# Patient Record
Sex: Male | Born: 1955
Health system: Southern US, Community
[De-identification: ages and names within clinical notes are randomized; demographics above are authoritative.]

## PROBLEM LIST (undated history)

## (undated) ENCOUNTER — Emergency Department (HOSPITAL_COMMUNITY): Admission: EM | Payer: Medicare Other | Source: Home / Self Care

## (undated) DIAGNOSIS — Z8613 Personal history of malaria: Secondary | ICD-10-CM

## (undated) DIAGNOSIS — I341 Nonrheumatic mitral (valve) prolapse: Secondary | ICD-10-CM

## (undated) DIAGNOSIS — Z8709 Personal history of other diseases of the respiratory system: Secondary | ICD-10-CM

## (undated) DIAGNOSIS — I10 Essential (primary) hypertension: Secondary | ICD-10-CM

## (undated) DIAGNOSIS — F329 Major depressive disorder, single episode, unspecified: Secondary | ICD-10-CM

## (undated) DIAGNOSIS — Z860100 Personal history of colon polyps, unspecified: Secondary | ICD-10-CM

## (undated) DIAGNOSIS — M47812 Spondylosis without myelopathy or radiculopathy, cervical region: Secondary | ICD-10-CM

## (undated) DIAGNOSIS — F32A Depression, unspecified: Secondary | ICD-10-CM

## (undated) DIAGNOSIS — K219 Gastro-esophageal reflux disease without esophagitis: Secondary | ICD-10-CM

## (undated) DIAGNOSIS — T7840XA Allergy, unspecified, initial encounter: Secondary | ICD-10-CM

## (undated) DIAGNOSIS — A060 Acute amebic dysentery: Secondary | ICD-10-CM

## (undated) DIAGNOSIS — K6289 Other specified diseases of anus and rectum: Secondary | ICD-10-CM

## (undated) DIAGNOSIS — F419 Anxiety disorder, unspecified: Secondary | ICD-10-CM

## (undated) DIAGNOSIS — R079 Chest pain, unspecified: Secondary | ICD-10-CM

## (undated) DIAGNOSIS — G4700 Insomnia, unspecified: Secondary | ICD-10-CM

## (undated) HISTORY — DX: Essential (primary) hypertension: I10

## (undated) HISTORY — DX: Major depressive disorder, single episode, unspecified: F32.9

## (undated) HISTORY — DX: Allergy, unspecified, initial encounter: T78.40XA

## (undated) HISTORY — DX: Depression, unspecified: F32.A

## (undated) HISTORY — DX: Insomnia, unspecified: G47.00

## (undated) HISTORY — DX: Spondylosis without myelopathy or radiculopathy, cervical region: M47.812

## (undated) HISTORY — DX: Gastro-esophageal reflux disease without esophagitis: K21.9

## (undated) HISTORY — PX: CARPAL TUNNEL RELEASE: SHX101

## (undated) HISTORY — DX: Nonrheumatic mitral (valve) prolapse: I34.1

## (undated) HISTORY — DX: Anxiety disorder, unspecified: F41.9

## (undated) HISTORY — DX: Personal history of colonic polyps: Z86.010

## (undated) HISTORY — DX: Personal history of colon polyps, unspecified: Z86.0100

## (undated) HISTORY — DX: Other specified diseases of anus and rectum: K62.89

---
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
## 1979-08-31 DIAGNOSIS — A060 Acute amebic dysentery: Secondary | ICD-10-CM

## 1979-08-31 HISTORY — DX: Acute amebic dysentery: A06.0

## 1982-08-30 DIAGNOSIS — Z8613 Personal history of malaria: Secondary | ICD-10-CM

## 1982-08-30 HISTORY — DX: Personal history of malaria: Z86.13

## 1998-10-13 ENCOUNTER — Ambulatory Visit (HOSPITAL_COMMUNITY): Admission: RE | Admit: 1998-10-13 | Discharge: 1998-10-13 | Payer: Self-pay | Admitting: Emergency Medicine

## 1998-10-13 ENCOUNTER — Encounter: Payer: Self-pay | Admitting: Emergency Medicine

## 1999-04-19 ENCOUNTER — Encounter: Payer: Self-pay | Admitting: Emergency Medicine

## 1999-04-19 ENCOUNTER — Emergency Department (HOSPITAL_COMMUNITY): Admission: EM | Admit: 1999-04-19 | Discharge: 1999-04-19 | Payer: Self-pay | Admitting: Emergency Medicine

## 2000-01-07 ENCOUNTER — Encounter: Payer: Self-pay | Admitting: Otolaryngology

## 2000-01-07 ENCOUNTER — Encounter: Admission: RE | Admit: 2000-01-07 | Discharge: 2000-01-07 | Payer: Self-pay | Admitting: Otolaryngology

## 2002-01-16 ENCOUNTER — Encounter: Admission: RE | Admit: 2002-01-16 | Discharge: 2002-01-16 | Payer: Self-pay | Admitting: Internal Medicine

## 2002-01-16 ENCOUNTER — Encounter: Payer: Self-pay | Admitting: Internal Medicine

## 2004-03-13 ENCOUNTER — Encounter: Admission: RE | Admit: 2004-03-13 | Discharge: 2004-03-13 | Payer: Self-pay | Admitting: Surgery

## 2005-06-14 ENCOUNTER — Ambulatory Visit (HOSPITAL_COMMUNITY): Admission: RE | Admit: 2005-06-14 | Discharge: 2005-06-14 | Payer: Self-pay | Admitting: *Deleted

## 2005-06-14 ENCOUNTER — Ambulatory Visit (HOSPITAL_BASED_OUTPATIENT_CLINIC_OR_DEPARTMENT_OTHER): Admission: RE | Admit: 2005-06-14 | Discharge: 2005-06-14 | Payer: Self-pay | Admitting: *Deleted

## 2005-09-07 ENCOUNTER — Encounter: Admission: RE | Admit: 2005-09-07 | Discharge: 2005-09-07 | Payer: Self-pay | Admitting: Neurosurgery

## 2005-09-28 ENCOUNTER — Encounter: Admission: RE | Admit: 2005-09-28 | Discharge: 2005-09-28 | Payer: Self-pay | Admitting: Neurosurgery

## 2005-09-30 HISTORY — PX: OTHER SURGICAL HISTORY: SHX169

## 2005-10-01 ENCOUNTER — Encounter: Admission: RE | Admit: 2005-10-01 | Discharge: 2005-10-01 | Payer: Self-pay | Admitting: Neurosurgery

## 2005-10-19 ENCOUNTER — Inpatient Hospital Stay (HOSPITAL_COMMUNITY): Admission: RE | Admit: 2005-10-19 | Discharge: 2005-10-21 | Payer: Self-pay | Admitting: Neurosurgery

## 2006-05-22 ENCOUNTER — Emergency Department (HOSPITAL_COMMUNITY): Admission: EM | Admit: 2006-05-22 | Discharge: 2006-05-22 | Payer: Self-pay | Admitting: Family Medicine

## 2008-01-21 ENCOUNTER — Emergency Department (HOSPITAL_COMMUNITY): Admission: EM | Admit: 2008-01-21 | Discharge: 2008-01-21 | Payer: Self-pay | Admitting: Family Medicine

## 2008-01-28 ENCOUNTER — Emergency Department (HOSPITAL_COMMUNITY): Admission: EM | Admit: 2008-01-28 | Discharge: 2008-01-28 | Payer: Self-pay | Admitting: Emergency Medicine

## 2008-02-01 ENCOUNTER — Encounter (INDEPENDENT_AMBULATORY_CARE_PROVIDER_SITE_OTHER): Payer: Self-pay | Admitting: Emergency Medicine

## 2008-02-01 ENCOUNTER — Inpatient Hospital Stay (HOSPITAL_COMMUNITY): Admission: EM | Admit: 2008-02-01 | Discharge: 2008-02-02 | Payer: Self-pay | Admitting: Emergency Medicine

## 2008-02-01 ENCOUNTER — Ambulatory Visit: Payer: Self-pay | Admitting: Vascular Surgery

## 2008-02-02 ENCOUNTER — Ambulatory Visit: Payer: Self-pay | Admitting: Psychiatry

## 2008-02-02 ENCOUNTER — Encounter (INDEPENDENT_AMBULATORY_CARE_PROVIDER_SITE_OTHER): Payer: Self-pay | Admitting: Internal Medicine

## 2008-02-08 ENCOUNTER — Ambulatory Visit (HOSPITAL_COMMUNITY): Admission: RE | Admit: 2008-02-08 | Discharge: 2008-02-08 | Payer: Self-pay | Admitting: Neurology

## 2008-02-27 ENCOUNTER — Emergency Department (HOSPITAL_COMMUNITY): Admission: EM | Admit: 2008-02-27 | Discharge: 2008-02-28 | Payer: Self-pay | Admitting: Emergency Medicine

## 2008-04-04 ENCOUNTER — Ambulatory Visit: Payer: Self-pay | Admitting: Psychology

## 2008-10-26 ENCOUNTER — Emergency Department (HOSPITAL_COMMUNITY): Admission: EM | Admit: 2008-10-26 | Discharge: 2008-10-26 | Payer: Self-pay | Admitting: Family Medicine

## 2008-10-28 HISTORY — PX: CARPAL TUNNEL RELEASE: SHX101

## 2009-08-04 ENCOUNTER — Encounter: Admission: RE | Admit: 2009-08-04 | Discharge: 2009-08-04 | Payer: Self-pay | Admitting: Neurosurgery

## 2009-10-27 ENCOUNTER — Encounter: Admission: RE | Admit: 2009-10-27 | Discharge: 2009-10-27 | Payer: Self-pay | Admitting: Internal Medicine

## 2010-02-06 ENCOUNTER — Encounter: Admission: RE | Admit: 2010-02-06 | Discharge: 2010-02-06 | Payer: Self-pay | Admitting: Neurosurgery

## 2010-02-26 ENCOUNTER — Encounter: Admission: RE | Admit: 2010-02-26 | Discharge: 2010-02-26 | Payer: Self-pay | Admitting: Neurosurgery

## 2010-03-09 ENCOUNTER — Encounter: Admission: RE | Admit: 2010-03-09 | Discharge: 2010-03-09 | Payer: Self-pay | Admitting: Neurosurgery

## 2010-09-20 ENCOUNTER — Encounter: Payer: Self-pay | Admitting: Neurosurgery

## 2011-01-12 NOTE — H&P (Signed)
[name], [name] NO.:  [id number]   MEDICAL RECORD NO.:  [id number]          PATIENT TYPE:  INP   LOCATION:  3737                         FACILITY:  [facility]   PHYSICIAN:  Ramiro Harvest, MD    DATE OF BIRTH:  10-16-1955   DATE OF ADMISSION:  02/01/2008  DATE OF DISCHARGE:                              HISTORY & PHYSICAL   PRIMARY CARE PHYSICIAN:  Duncan Dull, MD   CARDIOLOGIST:  Dr. Harland Dingwall at [facility]   HISTORY OF PRESENT ILLNESS:  Brad [name] is a 55 year old white male with  history of cervical spondylitis status post repair, anxiety, mitral  valve prolapse, and labile hypertension who presents to the ED for the  third time in the last week with a 3-4 week history of diffuse  continuous headaches, chills, dizziness, shortness of breath, and  midsternal intermittent chest pain.  The patient states that headache  has been continuous diffuse, unable to describe it with associated  blurry vision, slurred speech, unsteady gait, and numbness in his toes  and hands.  The patient denies any facial asymmetry.  No symmetric  weakness and also indulges in mild problems understanding speech  sometimes.  The patient also complained of midsternal intermittent chest  pain describes it as a tightness/pressure lasting greater than 30  minutes with radiation to abdominal region with palpitations, clammy  feeling, chills, shortness of breath.  The patient denies any fever.  No  cough.  No abdominal pain.  No melena.  No hematemesis.  No  hematochezia.  No other associated symptoms.  The patient also states  that she has been feeling depressed lately with decreased appetite,  insomnia, decreased concentration, feelings of guilt.  The patient  denies any suicidal ideation or any homicidal ideation.  The patient  states that he has been under a lot of stress lately.  The patient also  was complaining of some left lower extremity pain.  CMET, CBC, lower  extremity  Dopplers done in the emergency room were negative.  We were  called to admit the patient for further evaluation and management.   ALLERGIES:  No known drug allergies.   PAST MEDICAL HISTORY:  1. Cervical spondylitis of C5-C6, C6-C7, C7-T1, status post anterior      C5-C6, C6-C7, C7-T1 diskectomy, decompression spinal cord and      interbody fusion with allograft and autograft placed on T5-T1 on      October 19, 2005 Dr. Jeral Fruit.  2. Status post carpal tunnel surgery on the right on June 14, 2005,      by Dr. Tennis Must. Meyerdierks.  3. Peripheral neuropathy.  4. Mitral valve prolapse.  5. History of malaria.  6. Anxiety.  7. History of tinnitus secondary to barotrauma secondary to scuba      diving.  8. Labile hypertension.  9. Insomnia.   HOME MEDICATIONS:  1. Diovan 80 mg daily.  2. Ambien CR 12.5 mg p.o. q.h.s.  3. Sonata 10 mg p.o. q.h.s.  4. Dyazide 37.5 two times a week.  5. Tranxene 7 mg p.o. daily.  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  No tobacco, no IV use, is a social drinker,  drinks 1-2 glasses of wine per night.  He has a 54 year old son and an  73 year old daughter, all of whom are healthy.   FAMILY HISTORY:  Mother alive age 67.  Father had a history of  gallbladder cancer.  Father deceased age 95 from a cerebral hemorrhage.   REVIEW OF SYSTEMS:  As per HPI.   PHYSICAL EXAMINATION:  VITAL SIGNS:  Temperature 97.6, blood pressure  162/132 going down to 131/91 and going back up to 150/105, pulse of 107  going down to 97, respiratory rate 20, and satting 99% on room air.  GENERAL:  The patient is in no apparent distress, anxious-appearing.  HEENT:  Normocephalic and atraumatic.  Pupils equal, round, and reactive  to light.  Extraocular movements intact.  Oropharynx is clear.  No  lesions.  No exudates.  NECK:  Supple.  No lymphadenopathy.  RESPIRATORY:  Lungs are clear to auscultation bilaterally.   No wheezes.  No crackles.  No rhonchi.  CARDIOVASCULAR:  Regular rate and rhythm.  No murmurs, rubs, or gallops.  ABDOMEN:  Soft, nontender, and nondistended.  Positive bowel sounds.  EXTREMITIES:  No clubbing, cyanosis, or edema.  NEUROLOGIC:  The patient alert and oriented x3.  Cranial nerves II  through XII grossly intact.  Sensation is intact.  Negative Babinski.  Cerebella is intact.  No pronator drift.  Visual fields are intact.  5/5  bilateral upper extremity strength.  5/5 bilateral lower extremity  strength.  2+ patellar reflexes.  2+ Achilles reflexes.  Unable to  obtain brachial reflexes symmetrically.  Gait not tested.   LABORATORY DATA:  Sodium 133, potassium 3.9, chloride 97, bicarb 25, BUN  12, creatinine 0.94, glucose 101, calcium 9.9, albumin 4.8, bilirubin  1.4, and alk phosphatase 73.  AST 31, ALT 20, and protein 70.3.  D-dimer  less than 0.22.  CBC, white count 9.1, hemoglobin 15.8, hematocrit 45.9,  and platelets 233.  ANC of 6.4.  Acute abdominal series pending.  Lower  extremity Dopplers negative for DVT.   ASSESSMENT AND PLAN:  Brad [name] is a 55 year old gentleman with history  of mitral valve prolapse, labile hypertension, anxiety, cervical  spondylosis, status post repair who presents to the ED with a 3-4 week  history of headache, dizziness, and shortness of breath with some  neurological symptoms.  1. Headache/slurred speech/unsteady gait/blurred vision.  Questionable      etiology.  Differential includes CVA versus uncontrolled      hypertension versus headache versus anxiety.  We will admit the      patient to Telemetry.  Check head CT.  Check MRI and MRA of the      head and neck.  Cycle cardiac enzymes q.8 h x3.  Check an EKG.      Check a fasting lipid panel.  Check a fasting homocystine level.      Check a 2-D echo, bedside swallow evaluation, and Ultram for      headaches and aspirin 325 mg daily, PT/OT.  If MRI and MRA is      positive for an acute  infarct we will check carotid Dopplers and      consult with Neurology for further evaluation and recommendations.  2. Chest pain differential includes acute coronary syndrome versus GI      versus anxiety.  We will cycle cardiac enzymes  q.8 h. x3.  Check an      EKG.  Check a lipase.  Check a BNP.  Check a TSH.  Check a 2-D echo      to rule out LV dysfunction.  Check a fasting lipid panel,      nitroglycerin, morphine sulfate, oxygen, Lopressor 12.5 mg b.i.d.,      Diovan 80 mg daily, and Lovenox.  If positive cardiac enzymes to      consult with a Cardiology.  3. Labile hypertension.  Continue home dose Diovan and Lopressor and      hydralazine for systolic blood pressure greater than 180. Goal of      decrease in systolic blood pressure approximately 25% over the next      24 hours secondary to a possible CVA.  4. Anxiety.  Ativan p.r.n.  5. Mitral valve prolapse.  6. Peripheral neuropathy.  7. History of tinnitus.  8. History of cervical spondylitis, status post repair.  9. Status post carpal tunnel surgery.  10.Prophylaxis.  Protonix for GI prophylaxis.  Lovenox for DVT      prophylaxis.      Ramiro Harvest, MD  Electronically Signed     DT/MEDQ  D:  02/01/2008  T:  02/02/2008  Job:  [id number]   cc:   Candyce Churn, M.D.

## 2011-01-12 NOTE — Consult Note (Signed)
Brad [name], Brad [name] NO.:  [id number]   MEDICAL RECORD NO.:  [id number]          PATIENT TYPE:  INP   LOCATION:  3737                         FACILITY:  [facility]   PHYSICIAN:  Brad Jungling, MD  DATE OF BIRTH:  1955-10-25   DATE OF CONSULTATION:  02/02/2008  DATE OF DISCHARGE:                                 CONSULTATION   IDENTIFYING DATA/REASON FOR REFERRAL:  The patient is a 55 year old  married Caucasian male, currently under the care of Dr. Kevan [name] here at  [facility].  Psychiatric consultation is requested to assess  mood disturbance and make recommendations.   HISTORY OF THE PRESENTING PROBLEMS:  The patient has been admitted with  various symptoms including tremor, headache, chest pain which Dr. Kevan [name]  feels are likely not due to any underlying organic cause, but possibly  more consistent with a picture of excessive stress, anxiety and  depression.   The patient has been under the care of Dr. Archer [name], a local  psychiatrist, who the patient saw for an initial consultation about a  month ago.  He is also seeing an individual psychotherapy, Brad [name].  Although he has had trials of various antidepressant medications in the  past, he has not tolerated any of them.  In particular, Wellbutrin,  Cymbalta and Elavil seemed to cause significant agitation.  Other  antidepressants tried affected sexual functioning and were unacceptable.  He had been taking Klonopin 1 mg at bedtime for a long time, but that  was discontinued approximately 2 months ago.  In that time frame, the  patient has had more and more in the way of various anxious and  depressive symptoms.   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   The patient thus far has had a CT scan which has been  entirely normal.  Urine drug screen was positive only for benzodiazepines.  The patient is  having an MRI scan of the brain this morning.   Yesterday, the patient made a statement indicating the possibility of  suicidal ideation.  As such, he was placed with a one-to-one sitter for  safety.   In the course of this consultation, the patient gave me permission to  contact his wife Brad [name], who is a child psychiatrist.  She provided her  own prospective on the patient.  She sees him as very depressed and  describes various stressors that he has been dealing with including the  above referenced step-family issues and the harassing client.  She notes  that he has no history of suicide attempt and she does not feel that he  is an actual suicide risk, although she feels that it was probably a  good idea to order the one-to-one sitter.  She indicates that she is  comfortable with the possibility that he might be discharged home later  this afternoon.  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   She also notes that he was on 1 mg of Klonopin at bedtime for a long  time.  This was discontinued about 2 months ago.  Since then, he has  had more difficulties with various anxious and depressive symptoms.  She  states he is very responsible with taking medication.  She notes no  history of any substance abuse or chemical dependency issues.  Even when  he was on the Klonopin, he never took it every single night, as he did  not perceive that it was doing very much to help him.  She indicates  that currently, he is not on any psychotropic medications.  Although Dr.  Donell [name] had prescribed him some Tranxene, he apparently has not used it.   MENTAL STATUS/OBSERVATIONS:  The patient is a well-nourished, normally-  developed adult male who I interviewed in his hospital room.  He is  alert, awake and fully oriented.  He is of clearly superior   intelligence, intellect and education.  He was a pleasure to speak with.  I found him to be quite open and forthcoming with information about  himself and his situation.  He described in good detail the various  anxious and depressive symptoms he has had as well as the various  somatic problems that have been troubling him including headache.  He  also describes problems with concentration, focus and feeling like I  lost 100 IQ points.  He acknowledges feeling stressed by his career and  feels both pulled back to address his many responsibilities associated  with practice, yet at the same time dreading it.   He tells me that he has no true thoughts, plans or intention of harming  himself.  He discusses fairly openly the reason for the one-to-one  sitter and acknowledges the statement that he made that gave some alarm.  He gave me permission to contact his wife and if necessary Dr. Donell [name].   IMPRESSION:  Dr. Jolyne [name] describes a good deal in the way of various  anxious and depressive symptoms as well as various somatic complaints  that are currently being medically evaluated here at [facility], but  thus far, do not appear to be based on any organic condition.  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  He appears  to be open at this time to viewing his difficulties as being largely  stress related.  His wife appears to agree with this perspective.   DIAGNOSTIC IMPRESSION:  AXIS I:  Mood disorder not otherwise specified.  AXIS II:  Deferred.  AXIS III:  No acute or chronic illnesses.  AXIS IV:  Stressors severe.  AXIS V:  Global assessment of functioning 55.   RECOMMENDATIONS:  I discussed with the patient's wife, who is a  physician, the possibility that the discontinuation of Klonopin in  recent weeks may have  contributed to exacerbation of anxiety related  symptoms, depressive symptoms and perhaps even more so than it would  have otherwise had the patient not already been under a great deal of  work related and personal stress.  It is possible that there is a degree  of long-term benzodiazepine withdrawal occurring, even though he was not  abusing benzodiazepines, was not on a very high dose,  and has no history  of chemical dependency issues.  I have seen in many individuals who have  taken benzodiazepine such as Xanax and Klonopin over a long period of  time, that the withdrawal process can be extremely lengthy spanning  months.   Also of interest is the historical information that when he has gone to  emergency rooms recently with his various somatic complaints such as  headache, and has been given doses of benzodiazepines, he has  experienced rapid and almost complete relief from his distress.   The patient, as well as his wife, both agree with me that the most  important intervention at this time might be an examination by the  patient with the support of his wife, his therapist and his  psychiatrist, of the possibility of significant reduction of his  stressors load by reducing the volume of his practice, or taking a  significant period of time off for rest and recovery.   At this time, I do not feel that the patient presents any significant  suicide risk and I feel it is fine to send him home this afternoon  providing that Dr. Kevan [name] feels that he is medically clear at that time.  The patient and his wife have indicated that they will follow up with  Dr. Donell [name] and Brad [name], and make ongoing efforts to address his  needs for stress reduction.   I will recommend to Dr. Kevan [name] that he prescribe upon discharge a bedtime  dose of a benzodiazepine medication, perhaps Klonopin again, or perhaps  Librium.  My recommendation would be that this be used on a short-term  basis, 1-2 weeks,  then an immediate process of very gradual tapering  occur over perhaps a 60-month period.  This would be a short-term  approach to addressing the patient's current emotional distress.  As to  the possibility of there being more long-term pharmacologic approaches,  perhaps other antidepressant trials, mood stabilizers, etc., I will  leave that to Dr. Caprice Renshaw consultation.   Thank you for involving me in this patient's care.      Brad Jungling, MD  Electronically Signed     SPB/MEDQ  D:  02/02/2008  T:  02/02/2008  Job:  [id number]

## 2011-01-12 NOTE — Procedures (Signed)
REFERRING PHYSICIAN:  Rene Kocher, M.D.   CLINICAL HISTORY:  A 55 year old male being evaluated for mental status  change and confusion.   MEDICATIONS LISTED:  Ambien, Klonopin, Diovan, and Flonase.   This is a routine 17-channel EEG recorded with the patient awake using  standard 10/20 electrode placement.   The background awake rhythm consists of 11 Hz alpha, which is of  moderate amplitude rhythmic, symmetric, and reactive to eye opening and  closure.  No paroxysmal epileptiform activity, spikes, or sharp waves  are seen.  Sleep stages are not seen in this recording.  Length of this  EEG is 22.7 minutes.  Technical component is average.  EKG tracing  reveals regular sinus rhythm, hyperventilation, and photic stimulation  are both unremarkable.   IMPRESSION:  This EEG performed during awake state is within normal  limits.  No definite epileptiform features are noted.           ______________________________  Sunny Schlein. Pearlean Brownie, MD     JYN:[id number]  D:  02/08/2008 18:42:17  T:  02/08/2008 23:54:30  Job #:  [id number]   cc:   Rene Kocher, M.D.  Fax: [phone]

## 2011-01-15 NOTE — Op Note (Signed)
NAMEIZELL, LABAT NO.:  [id number]   MEDICAL RECORD NO.:  [id number]          PATIENT TYPE:  INP   LOCATION:  3040                         FACILITY:  [facility]   PHYSICIAN:  Hilda Lias, M.D.   DATE OF BIRTH:  1956-06-28   DATE OF PROCEDURE:  10/19/2005  DATE OF DISCHARGE:                                 OPERATIVE REPORT   PREOPERATIVE DIAGNOSIS:  Cervical spondylosis, 5-6, 6-7, 7-T1.  Status post  right carpal tunnel surgery.  Contracture of the right hand.  Peripheral  neuropathy.   POSTOPERATIVE DIAGNOSIS:  Cervical spondylosis, 5-6, 6-7, 7-T1.  Status post  right carpal tunnel surgery.  Contracture of the right hand.  Peripheral  neuropathy.   OPERATION PERFORMED:  Anterior 5-6, 6-7, 7-T1 diskectomy, decompression of  the spinal cord and interbody fusion with allograft and autograft plate from  C5 to T1.  Microscope.   SURGEON:  Hilda Lias, M.D.   ASSISTANT:  Stefani Dama, M.D.   ANESTHESIA:  General.   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  The  patient had carpal tunnel surgery and later on he developed contracture of  the right hand.  He is complaining of neck pain with burning sensation of  the right upper extremity.  X-ray shows spondylosis at the level of 5-6, 6-  7, T1.  __________ to C6, C7.  EMG showed some peripheral neuropathy.  Also  showed that he has residual carpal tunnel syndrome.  Surgery was advised.  The patient knew that decision about doing C6-C7 would be made during  surgery.  The risks were explained during the history and physical.   DESCRIPTION OF PROCEDURE:  The patient was taken to the operating room and  after intubation, the neck was prepped.  A transverse incision was made  through the skin and platysma down to the cervical spine.  X-ray showed that  indeed, we were at the level of 5-6 and C7-T1.  From  then on, we opened the  anterior ligament and quite a bit of degenerative disk was removed.  We  looked at the space between the C6 and 7 and indeed, the disk space was as  narrow as the one between the one above and below.  Because of that, we  decided to involve immediately the C6-C7 space.  We brought the microscope  into the area.  We started working our way from C7-T1 first.  The patient  had quite a bit of degenerative disk.  The patient had quite a bit of  spondylosis with narrowing of the foramina bilaterally right worse than the  left one.  The posterior ligament was opened and decompression of the spinal  cord as well as the C8 nerve root was achieved.  The same procedure was done  between 5-6 with the same findings.  The surprise was that C6 and C7 was as  bad as C7-T1.  The same procedure with decompression of the spinal cord and  nerve root was achieved.  Then we drilled the end plate.  We introduced free  graft of 7 mm height.  They were allograft with autograft inside.  This was  followed by a plate using eight screws.  Lateral cervical spine showed good  position of  the upper part of the graft.  It was difficult to see the lower part.  The  area was irrigated.  Nevertheless although we had good hemostasis, because  the dissection at the lower cervical-upper thoracic, we decided to leave a  drain.  The wound was irrigated and closed with Vicryl and Steri-Strips.           ______________________________  Hilda Lias, M.D.     EB/MEDQ  D:  10/19/2005  T:  10/20/2005  Job:  [id number]

## 2011-01-15 NOTE — H&P (Signed)
NAMEKINNETH, FUJIWARA NO.:  [id number]   MEDICAL RECORD NO.:  [id number]          PATIENT TYPE:  INP   LOCATION:  2899                         FACILITY:  [facility]   PHYSICIAN:  Hilda Lias, M.D.   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  The patient had an injection in the  cervical area with no improvement.  This problem started back in August,  when he injured his hand and from then on developed a burning sensation and  pain to the right hand.  He has had carpal tunnel surgery.  Later on, it was  found that he has a contracture of the tendons.  Because the pain was going  to the right upper extremity, an MRI was obtained which did not help.  We  did a myelogram which showed that he has right C6  spondylosis compromising  the sixth nerve root as well as the at the level of C7, T1 going to the  right side, and borderline between C6-C7.  Because of that, he is being  admitted for surgery.   PAST MEDICAL HISTORY:  Carpal tunnel surgery.   He is not allergic to any medication.   The patient does not smoke.  He drinks socially.   REVIEW OF SYSTEMS:  Positive for ringing in the ear, pain, and insomnia.   PHYSICAL EXAMINATION:  HEENT:  Normal.  NECK:  He has some of neck movement.  LUNGS:  Clear.  HEART:  Sounds normal.  ABDOMEN:  Normal.  EXTREMITIES:  Normal pulses.  NEUROLOGIC:  He has hypotonia on the right pectoralis major.  I can break  easily the biceps and the wrist extensor.  He has weakening of hypothenar  muscle on the right side.  Reflexes are 1+, normal Babinski.  Coordination,  gait normal.  During the physical examination, he complained of a burning  sensation on the  right hand.  He has a scar on the right hand from previous  surgery.   CLINICAL IMPRESSION:  C5-C6, C7, T1 spondylosis with radiculopathy  borderline between C6-C7.   RECOMMENDATIONS:  The patient wants to proceed with surgery.  He knows about  the risk and the surgery was explained fully on three occasions to him and  his fiance.  They know that the procedure will be a two-level cervical  diskectomy, leaving C6-C7 as a decision to be made during surgery.  The  risks were fully explained including the  possibility of no improvement whatsoever because the burning sensation may  be traumatic from the carpal tunnel syndrome, also infection, CSF leak,  damage to the vocal cord, damage to esophagus, failure, no improvement  whatsoever.  The patient more opinion.           ______________________________  Hilda Lias,  M.D.     EB/MEDQ  D:  10/19/2005  T:  10/19/2005  Job:  [id number]

## 2011-01-15 NOTE — Op Note (Signed)
NAMEMALACAI, GRANTZ NO.:  [id number]   MEDICAL RECORD NO.:  [id number]          PATIENT TYPE:  AMB   LOCATION:  DSC                          FACILITY:  [facility]   PHYSICIAN:  Tennis Must Meyerdierks, M.D.DATE OF BIRTH:  November 14, 1955   DATE OF PROCEDURE:  06/14/2005  DATE OF DISCHARGE:                                 OPERATIVE REPORT   PREOPERATIVE DIAGNOSIS:  Right carpal tunnel syndrome.   POSTOPERATIVE DIAGNOSIS:  Right carpal tunnel syndrome.   PROCEDURE:  Decompression of median nerve, right carpal tunnel.   SURGEON:  Lowell Bouton, M.D.   ANESTHESIA:  0.5% Marcaine local with sedation.   OPERATIVE FINDINGS:  The patient had an extended palmaris longus tendon that  came through the carpal tunnel as a distinct tendon volar to the median  nerve.  It spread out into the fascia just beyond the motor branch of the  nerve.  There were no other masses in the carpal tunnel and the motor branch  of the nerve was intact.   DESCRIPTION OF PROCEDURE:  Under 0.5% Marcaine local anesthesia with a  tourniquet on the right arm, the right hand was prepped and draped in the  usual fashion and after exsanguinating the limb, the tourniquet was inflated  to 250 mmHg.  A 3-cm longitudinal incision was made in the palm just ulnar  to the thenar crease.  Sharp dissection was carried through the subcutaneous  tissues.  Blunt dissection was carried through the superficial palmar fascia  and a hemostat was placed in the carpal canal up against the hook of the  hamate.  The transverse carpal ligament was then divided on the ulnar border  of the median nerve. The palmaris longus tendon was retracted radially to  allow protection of the nerve and the nerve was examined to be sure not to  injure the motor branch.  The proximal end of the ligament was divided with  the scissors after dissecting the nerve away from the undersurface of the  ligament with a Therapist, nutritional.  The  nerve was then examined and an external  neurolysis was performed.  The motor branch was identified.  The wound was  then irrigated copiously with saline.  The skin was closed with 4-0 nylon  sutures.  Sterile dressings were applied, followed by a volar wrist splint.  The patient tolerated the procedure well and went to the recovery room awake  and in stable condition.      Lowell Bouton, M.D.  Electronically Signed     EMM/MEDQ  D:  06/14/2005  T:  06/14/2005  Job:  [id number]   cc:   Lunette Stands, M.D.  Fax: [phone]

## 2011-05-26 LAB — POCT I-STAT, CHEM 8
BUN: 13
BUN: 14
Calcium, Ion: 1.19
Chloride: 96
Creatinine, Ser: 1
Creatinine, Ser: 1
Glucose, Bld: 113 — ABNORMAL HIGH
Glucose, Bld: 115 — ABNORMAL HIGH
HCT: 50
Hemoglobin: 16.3
Hemoglobin: 17
Potassium: 3.4 — ABNORMAL LOW
Potassium: 3.8
Sodium: 131 — ABNORMAL LOW
Sodium: 131 — ABNORMAL LOW
TCO2: 26

## 2011-05-26 LAB — CBC
Hemoglobin: 15.7
MCHC: 33.8
MCV: 93
RBC: 5
WBC: 10.2

## 2011-05-26 LAB — POCT CARDIAC MARKERS
CKMB, poc: 1.1
Myoglobin, poc: 58.8
Operator id: 151321
Troponin i, poc: <0.05

## 2011-05-27 LAB — URINALYSIS, ROUTINE W REFLEX MICROSCOPIC
Bilirubin Urine: NEGATIVE
Glucose, UA: NEGATIVE
Ketones, ur: 15 — AB
Nitrite: NEGATIVE
Protein, ur: NEGATIVE
pH: 6

## 2011-05-27 LAB — TSH: TSH: 0.817

## 2011-05-27 LAB — RAPID URINE DRUG SCREEN, HOSP PERFORMED
Benzodiazepines: POSITIVE — AB
Cocaine: NOT DETECTED
Opiates: NOT DETECTED
Tetrahydrocannabinol: NOT DETECTED
Tetrahydrocannabinol: NOT DETECTED

## 2011-05-27 LAB — LIPID PANEL
Cholesterol: 193
HDL: 80
Total CHOL/HDL Ratio: 2.4
VLDL: 9

## 2011-05-27 LAB — CBC
HCT: 40.4
HCT: 45.9
Hemoglobin: 13.8
Hemoglobin: 15.8
Hemoglobin: 13.7
MCHC: 34.1
MCV: 93.1
RBC: 4.34
RBC: 5.01
RBC: 4.29
RDW: 12.9
RDW: 12.2
WBC: 9.1

## 2011-05-27 LAB — BASIC METABOLIC PANEL
BUN: 16
CO2: 29
Calcium: 9.1
Calcium: 9.6
Creatinine, Ser: 1.11
GFR calc Af Amer: >60
GFR calc non Af Amer: >60
Glucose, Bld: 116 — ABNORMAL HIGH
Glucose, Bld: 111 — ABNORMAL HIGH
Sodium: 130 — ABNORMAL LOW

## 2011-05-27 LAB — ETHANOL: Alcohol, Ethyl (B): <5

## 2011-05-27 LAB — CARDIAC PANEL(CRET KIN+CKTOT+MB+TROPI)
CK, MB: 1.8
CK, MB: 2.3
Total CK: 133
Troponin I: 0.01

## 2011-05-27 LAB — DIFFERENTIAL
Basophils Absolute: 0
Basophils Absolute: 0
Basophils Relative: 0
Eosinophils Absolute: 0
Lymphocytes Relative: 17
Monocytes Absolute: 0.9
Monocytes Relative: 7
Neutro Abs: 6.4
Neutro Abs: 7
Neutrophils Relative %: 71

## 2011-05-27 LAB — COMPREHENSIVE METABOLIC PANEL
ALT: 20
Alkaline Phosphatase: 73
BUN: 12
CO2: 25
Chloride: 97
Glucose, Bld: 101 — ABNORMAL HIGH
Potassium: 3.9
Sodium: 133 — ABNORMAL LOW
Total Bilirubin: 1.4 — ABNORMAL HIGH
Total Protein: 7.3

## 2011-05-27 LAB — B-NATRIURETIC PEPTIDE (CONVERTED LAB): Pro B Natriuretic peptide (BNP): <30

## 2011-05-27 LAB — TROPONIN I: Troponin I: 0.02

## 2011-05-27 LAB — CK TOTAL AND CKMB (NOT AT ARMC): Total CK: 163

## 2011-06-17 ENCOUNTER — Encounter (INDEPENDENT_AMBULATORY_CARE_PROVIDER_SITE_OTHER): Payer: Self-pay | Admitting: Surgery

## 2011-06-17 ENCOUNTER — Ambulatory Visit (INDEPENDENT_AMBULATORY_CARE_PROVIDER_SITE_OTHER): Payer: BC Managed Care – PPO | Admitting: Surgery

## 2011-06-17 ENCOUNTER — Encounter (INDEPENDENT_AMBULATORY_CARE_PROVIDER_SITE_OTHER): Payer: Self-pay | Admitting: General Surgery

## 2011-06-17 DIAGNOSIS — K4090 Unilateral inguinal hernia, without obstruction or gangrene, not specified as recurrent: Secondary | ICD-10-CM

## 2011-06-17 NOTE — Progress Notes (Signed)
Brad [name] comes in today to discuss his left inguinal hernia. I last saw him in May of 2010 and we got so far as scheduling him for an open left inguinal hernia repair. He canceled that and was seeing Brad [name] and generating some complaints about it to refer him. Basically Brad [name] remains very active and likes to lift weights 3 times a week. He has studied this a bit and x-ray brought in an article from the [place] times on biological meshes. I told him these are not ready for prime time and would not offer him any benefit.  After a long discussion and examination which shows a more broad-based left inguinal hernia it is easily visible because he is thin we decided that we would continue to watch this. I'm very comfortable with that as his feet. He has a lot of apprehension about surgery. I will be glad to see him again on her knee. Return p.r.n.

## 2011-07-16 ENCOUNTER — Telehealth (INDEPENDENT_AMBULATORY_CARE_PROVIDER_SITE_OTHER): Payer: Self-pay | Admitting: General Surgery

## 2011-07-16 NOTE — Telephone Encounter (Signed)
The patient left a message on my voicemail to schedule [facility], returned his call and advised him that I will pass this message along to Dr Daphine Deutscher and he should hear from our scheduling department next week.

## 2011-07-28 NOTE — Telephone Encounter (Signed)
Let's schedule him....

## 2011-08-05 ENCOUNTER — Other Ambulatory Visit (INDEPENDENT_AMBULATORY_CARE_PROVIDER_SITE_OTHER): Payer: Self-pay | Admitting: Surgery

## 2011-08-06 ENCOUNTER — Telehealth (INDEPENDENT_AMBULATORY_CARE_PROVIDER_SITE_OTHER): Payer: Self-pay

## 2011-08-06 NOTE — Telephone Encounter (Signed)
Pt called concerned that he had not heard back [id number]. I reviewed notes and advised pt that the orders are in and he should be hearing from our scheduling dept.. I transferred him to scheduling and advised him to speak with them to set up surgery date.

## 2011-08-11 ENCOUNTER — Encounter (HOSPITAL_BASED_OUTPATIENT_CLINIC_OR_DEPARTMENT_OTHER)
Admission: RE | Admit: 2011-08-11 | Discharge: 2011-08-11 | Disposition: A | Discharge status: Home / Self Care | Payer: BC Managed Care – PPO | Source: Ambulatory Visit | Attending: Surgery | Admitting: Surgery

## 2011-08-11 ENCOUNTER — Other Ambulatory Visit (HOSPITAL_BASED_OUTPATIENT_CLINIC_OR_DEPARTMENT_OTHER): Payer: BC Managed Care – PPO

## 2011-08-11 ENCOUNTER — Other Ambulatory Visit: Payer: Self-pay

## 2011-08-11 ENCOUNTER — Encounter (HOSPITAL_BASED_OUTPATIENT_CLINIC_OR_DEPARTMENT_OTHER): Payer: Self-pay | Admitting: *Deleted

## 2011-08-11 LAB — BASIC METABOLIC PANEL
CO2: 29 mEq/L (ref 19–32)
Chloride: 95 mEq/L — ABNORMAL LOW (ref 96–112)
Creatinine, Ser: 0.95 mg/dL (ref 0.50–1.35)
Potassium: 3.8 mEq/L (ref 3.5–5.1)

## 2011-08-11 NOTE — Progress Notes (Signed)
Coming in for ekg,bmet

## 2011-08-13 ENCOUNTER — Encounter (HOSPITAL_BASED_OUTPATIENT_CLINIC_OR_DEPARTMENT_OTHER): Payer: Self-pay | Admitting: *Deleted

## 2011-08-16 ENCOUNTER — Encounter (HOSPITAL_BASED_OUTPATIENT_CLINIC_OR_DEPARTMENT_OTHER)
Admission: RE | Disposition: A | Discharge status: Home / Self Care | Payer: Self-pay | Source: Ambulatory Visit | Attending: Surgery

## 2011-08-16 ENCOUNTER — Encounter (HOSPITAL_BASED_OUTPATIENT_CLINIC_OR_DEPARTMENT_OTHER): Payer: Self-pay | Admitting: Anesthesiology

## 2011-08-16 ENCOUNTER — Ambulatory Visit (HOSPITAL_BASED_OUTPATIENT_CLINIC_OR_DEPARTMENT_OTHER)
Admission: RE | Admit: 2011-08-16 | Discharge: 2011-08-16 | Disposition: A | Discharge status: Home / Self Care | Payer: BC Managed Care – PPO | Source: Ambulatory Visit | Attending: Surgery | Admitting: Surgery

## 2011-08-16 ENCOUNTER — Ambulatory Visit (HOSPITAL_BASED_OUTPATIENT_CLINIC_OR_DEPARTMENT_OTHER): Payer: BC Managed Care – PPO | Admitting: Anesthesiology

## 2011-08-16 ENCOUNTER — Encounter (HOSPITAL_BASED_OUTPATIENT_CLINIC_OR_DEPARTMENT_OTHER): Payer: Self-pay

## 2011-08-16 DIAGNOSIS — F17200 Nicotine dependence, unspecified, uncomplicated: Secondary | ICD-10-CM | POA: Insufficient documentation

## 2011-08-16 DIAGNOSIS — K4090 Unilateral inguinal hernia, without obstruction or gangrene, not specified as recurrent: Secondary | ICD-10-CM

## 2011-08-16 DIAGNOSIS — I10 Essential (primary) hypertension: Secondary | ICD-10-CM | POA: Insufficient documentation

## 2011-08-16 DIAGNOSIS — K219 Gastro-esophageal reflux disease without esophagitis: Secondary | ICD-10-CM | POA: Insufficient documentation

## 2011-08-16 DIAGNOSIS — Z01810 Encounter for preprocedural cardiovascular examination: Secondary | ICD-10-CM | POA: Insufficient documentation

## 2011-08-16 DIAGNOSIS — I059 Rheumatic mitral valve disease, unspecified: Secondary | ICD-10-CM | POA: Insufficient documentation

## 2011-08-16 HISTORY — PX: INGUINAL HERNIA REPAIR: SHX194

## 2011-08-16 SURGERY — REPAIR, HERNIA, INGUINAL, ADULT
Anesthesia: General | Laterality: Left | Wound class: Clean

## 2011-08-16 MED ORDER — PROMETHAZINE HCL 25 MG/ML IJ SOLN: 6.2500 mg | INTRAMUSCULAR | Status: DC | PRN

## 2011-08-16 MED ORDER — OXYCODONE-ACETAMINOPHEN 5-325 MG PO TABS
1.0000 | ORAL_TABLET | ORAL | Status: DC | PRN
  Administered 2011-08-16: 1 via ORAL

## 2011-08-16 MED ORDER — CEFAZOLIN SODIUM 1-5 GM-% IV SOLN
1.0000 g | INTRAVENOUS | Status: AC
  Administered 2011-08-16: 1 g via INTRAVENOUS

## 2011-08-16 MED ORDER — EPHEDRINE SULFATE 50 MG/ML IJ SOLN
INTRAMUSCULAR | Status: DC | PRN
  Administered 2011-08-16: 10 mg via INTRAVENOUS

## 2011-08-16 MED ORDER — HEPARIN SODIUM (PORCINE) 5000 UNIT/ML IJ SOLN
5000.0000 [IU] | Freq: Once | INTRAMUSCULAR | Status: AC
  Administered 2011-08-16: 5000 [IU] via SUBCUTANEOUS

## 2011-08-16 MED ORDER — PROPOFOL 10 MG/ML IV EMUL
INTRAVENOUS | Status: DC | PRN
  Administered 2011-08-16: 200 mg via INTRAVENOUS

## 2011-08-16 MED ORDER — BUPIVACAINE HCL (PF) 0.25 % IJ SOLN
INTRAMUSCULAR | Status: DC | PRN
  Administered 2011-08-16: 8 mL

## 2011-08-16 MED ORDER — OXYCODONE HCL 5 MG PO TABS: 5.0000 mg | ORAL_TABLET | ORAL | Status: DC | PRN

## 2011-08-16 MED ORDER — LACTATED RINGERS IV SOLN
INTRAVENOUS | Status: DC
  Administered 2011-08-16 (×3): via INTRAVENOUS

## 2011-08-16 MED ORDER — HYDROMORPHONE HCL PF 1 MG/ML IJ SOLN
0.2500 mg | INTRAMUSCULAR | Status: DC | PRN
  Administered 2011-08-16 (×4): 0.5 mg via INTRAVENOUS

## 2011-08-16 MED ORDER — SODIUM CHLORIDE 0.9 % IJ SOLN: 3.0000 mL | INTRAMUSCULAR | Status: DC | PRN

## 2011-08-16 MED ORDER — DEXAMETHASONE SODIUM PHOSPHATE 4 MG/ML IJ SOLN
INTRAMUSCULAR | Status: DC | PRN
  Administered 2011-08-16: 10 mg via INTRAVENOUS

## 2011-08-16 MED ORDER — ACETAMINOPHEN 325 MG PO TABS: 650.0000 mg | ORAL_TABLET | ORAL | Status: DC | PRN

## 2011-08-16 MED ORDER — FENTANYL CITRATE 0.05 MG/ML IJ SOLN
INTRAMUSCULAR | Status: DC | PRN
  Administered 2011-08-16: 25 ug via INTRAVENOUS
  Administered 2011-08-16: 50 ug via INTRAVENOUS
  Administered 2011-08-16 (×3): 25 ug via INTRAVENOUS
  Administered 2011-08-16: 50 ug via INTRAVENOUS

## 2011-08-16 MED ORDER — PROMETHAZINE HCL 25 MG/ML IJ SOLN: 12.5000 mg | Freq: Four times a day (QID) | INTRAMUSCULAR | Status: DC | PRN

## 2011-08-16 MED ORDER — ACETAMINOPHEN 650 MG RE SUPP: 650.0000 mg | RECTAL | Status: DC | PRN

## 2011-08-16 MED ORDER — SODIUM CHLORIDE 0.9 % IJ SOLN: 3.0000 mL | Freq: Two times a day (BID) | INTRAMUSCULAR | Status: DC

## 2011-08-16 MED ORDER — ONDANSETRON HCL 4 MG/2ML IJ SOLN
INTRAMUSCULAR | Status: DC | PRN
  Administered 2011-08-16: 4 mg via INTRAVENOUS

## 2011-08-16 MED ORDER — MIDAZOLAM HCL 5 MG/5ML IJ SOLN
INTRAMUSCULAR | Status: DC | PRN
  Administered 2011-08-16: 2 mg via INTRAVENOUS

## 2011-08-16 MED ORDER — OXYCODONE-ACETAMINOPHEN 5-325 MG PO TABS: 1.0000 | ORAL_TABLET | ORAL | Status: AC | PRN

## 2011-08-16 MED ORDER — ONDANSETRON HCL 4 MG/2ML IJ SOLN: 4.0000 mg | Freq: Four times a day (QID) | INTRAMUSCULAR | Status: DC | PRN

## 2011-08-16 MED ORDER — SODIUM CHLORIDE 0.9 % IV SOLN: 250.0000 mL | INTRAVENOUS | Status: DC | PRN

## 2011-08-16 SURGICAL SUPPLY — 51 items
ADH SKN CLS APL DERMABOND .7 (GAUZE/BANDAGES/DRESSINGS) ×1
APL SKNCLS STERI-STRIP NONHPOA (GAUZE/BANDAGES/DRESSINGS)
BENZOIN TINCTURE PRP APPL 2/3 (GAUZE/BANDAGES/DRESSINGS) IMPLANT
BLADE SURG 15 STRL LF DISP TIS (BLADE) ×1 IMPLANT
BLADE SURG 15 STRL SS (BLADE) ×2
BLADE SURG ROTATE 9660 (MISCELLANEOUS) IMPLANT
CANISTER SUCTION 1200CC (MISCELLANEOUS) ×2 IMPLANT
CLEANER CAUTERY TIP 5X5 PAD (MISCELLANEOUS) ×1 IMPLANT
CLOTH BEACON ORANGE TIMEOUT ST (SAFETY) ×2 IMPLANT
COVER MAYO STAND STRL (DRAPES) ×2 IMPLANT
COVER TABLE BACK 60X90 (DRAPES) ×2 IMPLANT
DECANTER SPIKE VIAL GLASS SM (MISCELLANEOUS) ×2 IMPLANT
DERMABOND ADVANCED (GAUZE/BANDAGES/DRESSINGS) ×1
DERMABOND ADVANCED .7 DNX12 (GAUZE/BANDAGES/DRESSINGS) IMPLANT
DRAIN PENROSE 1/2X12 LTX STRL (WOUND CARE) ×2 IMPLANT
DRAPE LAPAROTOMY T 102X78X121 (DRAPES) ×2 IMPLANT
ELECT REM PT RETURN 9FT ADLT (ELECTROSURGICAL) ×2
ELECTRODE REM PT RTRN 9FT ADLT (ELECTROSURGICAL) ×1 IMPLANT
GAUZE SPONGE 4X4 12PLY STRL LF (GAUZE/BANDAGES/DRESSINGS) ×4 IMPLANT
GAUZE SPONGE 4X4 16PLY XRAY LF (GAUZE/BANDAGES/DRESSINGS) IMPLANT
GLOVE BIO SURGEON STRL SZ8 (GLOVE) ×2 IMPLANT
GLOVE ECLIPSE 6.5 STRL STRAW (GLOVE) ×1 IMPLANT
GOWN PREVENTION PLUS XLARGE (GOWN DISPOSABLE) ×2 IMPLANT
GOWN PREVENTION PLUS XXLARGE (GOWN DISPOSABLE) ×2 IMPLANT
MESH HERNIA 3X6 (Mesh General) ×1 IMPLANT
NDL HYPO 25X1 1.5 SAFETY (NEEDLE) IMPLANT
NEEDLE HYPO 25X1 1.5 SAFETY (NEEDLE) IMPLANT
NS IRRIG 1000ML POUR BTL (IV SOLUTION) ×2 IMPLANT
PACK BASIN DAY SURGERY FS (CUSTOM PROCEDURE TRAY) ×2 IMPLANT
PAD CLEANER CAUTERY TIP 5X5 (MISCELLANEOUS) ×1
PENCIL BUTTON HOLSTER BLD 10FT (ELECTRODE) ×2 IMPLANT
SLEEVE SCD COMPRESS KNEE MED (MISCELLANEOUS) IMPLANT
STAPLER VISISTAT 35W (STAPLE) IMPLANT
STRIP CLOSURE SKIN 1/2X4 (GAUZE/BANDAGES/DRESSINGS) IMPLANT
SUT MON AB 5-0 PS2 18 (SUTURE) ×1 IMPLANT
SUT PROLENE 0 CT 1 30 (SUTURE) IMPLANT
SUT PROLENE 2 0 CT2 30 (SUTURE) ×4 IMPLANT
SUT SILK 2 0 SH (SUTURE) ×1 IMPLANT
SUT VIC AB 2-0 SH 27 (SUTURE) ×2
SUT VIC AB 2-0 SH 27XBRD (SUTURE) ×1 IMPLANT
SUT VIC AB 4-0 SH 18 (SUTURE) ×2 IMPLANT
SUT VIC AB 5-0 P-3 18X BRD (SUTURE) IMPLANT
SUT VIC AB 5-0 P3 18 (SUTURE)
SUT VICRYL 4-0 PS2 18IN ABS (SUTURE) IMPLANT
SYR BULB 3OZ (MISCELLANEOUS) ×2 IMPLANT
SYR CONTROL 10ML LL (SYRINGE) IMPLANT
TOWEL OR 17X24 6PK STRL BLUE (TOWEL DISPOSABLE) ×4 IMPLANT
TRAY DSU PREP LF (CUSTOM PROCEDURE TRAY) ×2 IMPLANT
TUBE CONNECTING 20X1/4 (TUBING) ×2 IMPLANT
WATER STERILE IRR 1000ML POUR (IV SOLUTION) ×2 IMPLANT
YANKAUER SUCT BULB TIP NO VENT (SUCTIONS) ×2 IMPLANT

## 2011-08-16 NOTE — Anesthesia Preprocedure Evaluation (Addendum)
Anesthesia Evaluation  Patient identified by MRN, date of birth, ID band Patient awake    Reviewed: Allergy & Precautions, H&P , NPO status , Patient's Chart, lab work & pertinent test results  Airway Mallampati: II TM Distance: >3 FB Neck ROM: Full    Dental  (+) Teeth Intact and Dental Advisory Given   Pulmonary Current Smoker (marijuana),  clear to auscultation  Pulmonary exam normal       Cardiovascular hypertension (took meds today), Pt. on medications + Valvular Problems/Murmurs ([facility]CO 2009  mild MVP, pt ASx) MVP Regular Normal    Neuro/Psych    GI/Hepatic Neg liver ROS, GERD-  Controlled,  Endo/Other  Negative Endocrine ROS  Renal/GU negative Renal ROS     Musculoskeletal   Abdominal   Peds  Hematology negative hematology ROS (+)   Anesthesia Other Findings   Reproductive/Obstetrics                          Anesthesia Physical Anesthesia Plan  ASA: II  Anesthesia Plan: General   Post-op Pain Management:    Induction:   Airway Management Planned: LMA  Additional Equipment:   Intra-op Plan:   Post-operative Plan:   Informed Consent: I have reviewed the patients History and Physical, chart, labs and discussed the procedure including the risks, benefits and alternatives for the proposed anesthesia with the patient or authorized representative who has indicated his/her understanding and acceptance.   Dental advisory given  Plan Discussed with: CRNA and Surgeon  Anesthesia Plan Comments: (Plan routine monitors, GA- LMA OK)        Anesthesia Quick Evaluation

## 2011-08-16 NOTE — Op Note (Signed)
Surgeon: Wenda Low, MD, FACS  Asst:  none  Anes:  General by LMA  Procedure: Left inguinal herniorrhaphy with mesh  Diagnosis: Left direct and indirect inguinal herniae ([place])  Complications: none  EBL:   5 cc  Description of Procedure:  The patient was taken to room 4 at [facility] and given general.  The left inguinal region was clipped and prepped with PCMX and draped.  A timeout was performed.  A smalll obliquie incision was made and carried down to the external oblique fascia which was incised and opened.  Nerves were identified and spared.  A direct hernia was obvious.  The cord was dissected from the floor and retracted with a Penrose drain.  The cord was inspected and an indirect hernia was identified and dissected free, opened and the floor inspected from within.  It was high ligated with a silk and reduced.    A piece of Marlex type mesh was cut to fit and sewn in with a running 2-0 Prolene suture along the inguinal ligament and medially along the internal/transversalis fascia.  It was sewn to itself laterally and tucked beneath the external oblique.  The oblique was closed with 2-0 vicryl and then 0.25 marcaine was injected.  The wound was closed in layers with 4-0 vicryl and 5-0 monocryl.  Dermabond was used on the skin.    [name], MD, [facility], [place] [phone]

## 2011-08-16 NOTE — Anesthesia Procedure Notes (Addendum)
Procedure Name: LMA Insertion Date/Time: 08/16/2011 2:34 PM Performed by: Signa Kell Pre-anesthesia Checklist: Patient identified, Emergency Drugs available, Suction available and Patient being monitored Patient Re-evaluated:Patient Re-evaluated prior to inductionOxygen Delivery Method: Circle System Utilized Preoxygenation: Pre-oxygenation with 100% oxygen Intubation Type: IV induction Ventilation: Mask ventilation without difficulty LMA: LMA inserted LMA Size: 4.0 Number of attempts: 1 Airway Equipment and Method: bite block Placement Confirmation: positive ETCO2 Tube secured with: Tape Dental Injury: Teeth and Oropharynx as per pre-operative assessment

## 2011-08-16 NOTE — Transfer of Care (Signed)
Immediate Anesthesia Transfer of Care Note  Patient: Brad [name] [facility]  Procedure(s) Performed:  HERNIA REPAIR INGUINAL ADULT  Patient Location: PACU  Anesthesia Type: General  Level of Consciousness: awake  Airway & Oxygen Therapy: Patient Spontanous Breathing and Patient connected to face mask oxygen  Post-op Assessment: Report given to PACU RN and Post -op Vital signs reviewed and stable  Post vital signs: Reviewed and stable  Complications: No apparent anesthesia complications

## 2011-08-16 NOTE — H&P (Signed)
Chief Complaint:  Left inguinal hernia   History of Present Illness:  Brad [name] is an 55 y.o. male who has had a left inguinal hernia for some time.  It has become more symptomatic when he works out.  Informed consent was obtained regarding recurrent and postop pain, etc.  He has decided that he wants to proceed.    Past Medical History  Diagnosis Date  . GERD (gastroesophageal reflux disease)   . Hearing loss     bilateral   . Anxiety   . Hypertension   . Perianal pain   . Allergy   . Tinnitus   . Personal history of colonic polyps   . MVP (mitral valve prolapse)   . DJD (degenerative joint disease), cervical   . Inguinal hernia     left  . Insomnia   . Depression     Past Surgical History  Procedure Date  . Carpal tunnel release 10/06, 5/10    right wrist   . Cervical spine discectomy  2/07  . Carpal tunnel release 3/10    left wrist   . Tonsillectomy 1960    Medications Prior to Admission  Medication Dose Route Frequency Provider Last Rate Last Dose  . ceFAZolin (ANCEF) IVPB 1 g/50 mL premix  1 g Intravenous 60 min Pre-Op Valarie Merino, MD      . heparin injection 5,000 Units  5,000 Units Subcutaneous Once Valarie Merino, MD   5,000 Units at 08/16/11 1236  . lactated ringers infusion   Intravenous Continuous Constance Goltz, MD 20 mL/hr at 08/16/11 1223     Medications Prior to Admission  Medication Sig Dispense Refill  . busPIRone (BUSPAR) 10 MG tablet Take 20 mg by mouth 3 (three) times daily.       Marland Kitchen LORazepam (ATIVAN) 1 MG tablet Take 1 mg by mouth every 8 (eight) hours.        Marland Kitchen omeprazole (PRILOSEC) 20 MG capsule Take 20 mg by mouth daily.        . valsartan-hydrochlorothiazide (DIOVAN-HCT) 160-12.5 MG per tablet Take 1 tablet by mouth daily. 1/2      . acyclovir (ZOVIRAX) 800 MG tablet Take 800 mg by mouth 2 (two) times daily.        Marland Kitchen aspirin 81 MG tablet Take 81 mg by mouth daily.        Marland Kitchen doxepin (SINEQUAN) 10 MG capsule 10 mg at bedtime.  Takes 2      . sildenafil (VIAGRA) 100 MG tablet Take 100 mg by mouth daily as needed.        . zaleplon (SONATA) 10 MG capsule Take 10 mg by mouth at bedtime.       Marland Kitchen zolpidem (AMBIEN CR) 12.5 MG CR tablet Take 12.5 mg by mouth at bedtime as needed.         No Known Allergies History reviewed. No pertinent family history. Social History:   reports that he has never smoked. He has never used smokeless tobacco. He reports that he drinks about 1.2 ounces of alcohol per week. He reports that he uses illicit drugs (Marijuana).   REVIEW OF SYSTEMS - PERTINENT POSITIVES ONLY: noncontributory  Physical Exam:   Blood pressure 120/84, pulse 59, temperature 97.8 F (36.6 C), temperature source Oral, resp. rate 20, height 5\' 10"  (1.778 m), weight 150 lb (68.04 kg), SpO2 100.00%. Body mass index is 21.52 kg/(m^2).  Gen:  No acute distress.  Well nourished and well groomed.  Neurological: Alert and oriented to person, place, and time. Coordination normal.  Head: Normocephalic and atraumatic.  Eyes: Conjunctivae are normal. Pupils are equal, round, and reactive to light. No scleral icterus.  Neck: Normal range of motion. Neck supple. No tracheal deviation or thyromegaly present.  Cardiovascular:  SR without murmurs or gallops Respiratory: Effort normal.  No respiratory distress. No chest wall tenderness. Breath sounds normal.  No wheezes, rales or rhonchi.  GI: Soft. Bowel sounds are normal. The abdomen is soft and nontender.  There is no rebound and no guarding. GU:  Left inguinal hernia Musculoskeletal: Normal range of motion. Extremities are nontender.  Lymphadenopathy: No cervical, preauricular, postauricular or axillary adenopathy is present Skin: Skin is warm and dry. No rash noted. No diaphoresis. No erythema. No pallor. No clubbing, cyanosis, or edema.  Pscyh: Normal mood and affect. Behavior is normal. Judgment and thought content normal.   LABORATORY RESULTS: No results found for this  or any previous visit (from the past 48 hour(s)).  RADIOLOGY RESULTS: No results found.  Problem List: Active Problems:  * No active hospital problems. *    Assessment & Plan: Left inguinal hernia.  Marked. Plan open left inguinal hernia repair with mesh.    [name], MD, [facility] [phone] beeper [phone]  08/16/2011 2:19 PM There has been no change in the patient's past medical history or physical exam in the past 24 hours to the best of my knowledge.  Expectations and outcome results have been discussed with the patient to include risks and benefits.  All questions have been answered and will proceed with previously discussed procedure noted and signed in the consent form in the patient's record.    Tyshana Nishida BMD @NOW  08/16/2011

## 2011-08-16 NOTE — OR Nursing (Signed)
Computer problems Zenda Alpers charted under Elayne Guerin RN

## 2011-08-16 NOTE — Anesthesia Postprocedure Evaluation (Signed)
  Anesthesia Post-op Note  Patient: Brad [name]  Procedure(s) Performed:  HERNIA REPAIR INGUINAL ADULT  Patient Location: PACU  Anesthesia Type: General  Level of Consciousness: awake, alert  and oriented  Airway and Oxygen Therapy: Patient Spontanous Breathing  Post-op Pain: mild  Post-op Assessment: Post-op Vital signs reviewed, Patient's Cardiovascular Status Stable, Respiratory Function Stable, Patent Airway, No signs of Nausea or vomiting and Pain level controlled  Post-op Vital Signs: Reviewed and stable  Complications: No apparent anesthesia complications

## 2011-08-17 ENCOUNTER — Telehealth (INDEPENDENT_AMBULATORY_CARE_PROVIDER_SITE_OTHER): Payer: Self-pay

## 2011-08-17 NOTE — Telephone Encounter (Signed)
Vicodin 5/325 one tab po every 4 to 6 hours prn for pain #30- called in - ok Dr. Daphine Deutscher- D/C Percocet maybe causing hiccups. Do not increase dose of Phenergan.  Patient and wife aware- Rx called to [facility]. C/O bruising of penis. Patient aware this is normal- continue to apply ice pack.

## 2011-08-17 NOTE — Telephone Encounter (Signed)
C/O  hiccups- LIH repair 08/16/2011- per Dr. Daphine Deutscher: Rx Phenergan 25 mg suppositories # 12- one every 6 hours for nausea and hiccups. Rx called to [facility] [phone]. Patient's wife aware.

## 2011-08-18 ENCOUNTER — Encounter (HOSPITAL_BASED_OUTPATIENT_CLINIC_OR_DEPARTMENT_OTHER): Payer: Self-pay | Admitting: Surgery

## 2011-08-25 ENCOUNTER — Ambulatory Visit (INDEPENDENT_AMBULATORY_CARE_PROVIDER_SITE_OTHER): Payer: BC Managed Care – PPO | Admitting: General Surgery

## 2011-08-25 ENCOUNTER — Telehealth (INDEPENDENT_AMBULATORY_CARE_PROVIDER_SITE_OTHER): Payer: Self-pay | Admitting: Surgery

## 2011-08-25 ENCOUNTER — Encounter (INDEPENDENT_AMBULATORY_CARE_PROVIDER_SITE_OTHER): Payer: Self-pay | Admitting: General Surgery

## 2011-08-25 VITALS — BP 136/90 | HR 80 | Temp 96.8°F | Resp 12 | Ht 70.0 in | Wt 157.0 lb

## 2011-08-25 DIAGNOSIS — Z5189 Encounter for other specified aftercare: Secondary | ICD-10-CM

## 2011-08-25 DIAGNOSIS — Z4889 Encounter for other specified surgical aftercare: Secondary | ICD-10-CM

## 2011-08-25 NOTE — Progress Notes (Signed)
Subjective:     Patient ID: Brad [name], male   DOB: 1956-01-27, 55 y.o.   MRN: [id number]  HPI This patient is status post open left inguinal hernia repair with mesh on August 16, 2011. He complains of a bulge in the area of his hernia surgery as well as hiccuping and bloating after eating. He is taking pain medicine as well as a stool softener and that is really the only change since before his surgery. He states his pain is improving everyday and has not had any other constitutional symptoms.  Review of Systems     Objective:   Physical Exam Nontoxic appearing in no acute distress His incision is healing well without sign of infection he does have a fairly large healing ridge in the area of the wound which could be normal postop but could also represent postoperative hematoma or early recurrence though I do not feel any change with Valsalva. He has some bruising in his penis and scrotum is well which seems to be in the resolving stages.    Assessment:     Status post open left inguinal hernia repair and everything seems to be appropriate postoperatively and I do not see any evidence of recurrence. He does have a very large healing ridge which could be represent early recurrence versus hematoma versus normal postoperative healing. At this point I would not recommend any further intervention other than continued observation to see how this heals over the next 2-3 month. He states that he is feeling better with the pain in his decreasing each day. With regard to hiccuping and the bloating I'm not sure what could be causing this   an and doubt that this could be related to an open inguinal hernia surgery.    Plan:     He will followup in another week or 2 with Dr. Daphine Deutscher and otherwise we will continue with observation to see how things heal over the next few months.

## 2011-08-27 ENCOUNTER — Telehealth (INDEPENDENT_AMBULATORY_CARE_PROVIDER_SITE_OTHER): Payer: Self-pay

## 2011-08-27 NOTE — Telephone Encounter (Signed)
Called pt to let him know letter to [facility] that he requested from Dr. Daphine Deutscher was ready to be picked up at the front desk.

## 2011-09-10 ENCOUNTER — Ambulatory Visit (INDEPENDENT_AMBULATORY_CARE_PROVIDER_SITE_OTHER): Payer: BC Managed Care – PPO | Admitting: Surgery

## 2011-09-10 ENCOUNTER — Encounter (INDEPENDENT_AMBULATORY_CARE_PROVIDER_SITE_OTHER): Payer: Self-pay | Admitting: Surgery

## 2011-09-10 VITALS — BP 130/86 | HR 80 | Temp 98.0°F | Resp 16 | Ht 70.0 in | Wt 153.2 lb

## 2011-09-10 DIAGNOSIS — K4090 Unilateral inguinal hernia, without obstruction or gangrene, not specified as recurrent: Secondary | ICD-10-CM | POA: Insufficient documentation

## 2011-09-10 NOTE — Progress Notes (Signed)
Brad [name] is almost 4 weeks out from his surgery.  His incision looks good and the repair is intact.  I will recheck him in 2 months

## 2011-09-17 ENCOUNTER — Encounter (INDEPENDENT_AMBULATORY_CARE_PROVIDER_SITE_OTHER): Payer: BC Managed Care – PPO | Admitting: Surgery

## 2011-10-30 ENCOUNTER — Encounter (HOSPITAL_COMMUNITY): Payer: Self-pay | Admitting: Cardiology

## 2011-10-30 ENCOUNTER — Emergency Department (INDEPENDENT_AMBULATORY_CARE_PROVIDER_SITE_OTHER)
Admission: EM | Admit: 2011-10-30 | Discharge: 2011-10-30 | Disposition: A | Discharge status: Home / Self Care | Payer: BC Managed Care – PPO | Source: Home / Self Care | Attending: Family Medicine | Admitting: Family Medicine

## 2011-10-30 DIAGNOSIS — J329 Chronic sinusitis, unspecified: Secondary | ICD-10-CM

## 2011-10-30 DIAGNOSIS — J0100 Acute maxillary sinusitis, unspecified: Secondary | ICD-10-CM

## 2011-10-30 MED ORDER — FLUTICASONE PROPIONATE 50 MCG/ACT NA SUSP: 2.0000 | Freq: Every day | NASAL | Status: DC

## 2011-10-30 MED ORDER — FEXOFENADINE-PSEUDOEPHED ER 180-240 MG PO TB24: 1.0000 | ORAL_TABLET | Freq: Every day | ORAL | Status: DC

## 2011-10-30 MED ORDER — AZITHROMYCIN 250 MG PO TABS: ORAL_TABLET | ORAL | Status: DC

## 2011-10-30 NOTE — ED Notes (Signed)
Pt reports possible sinus infection. Symptoms started yesterday. Pt states nasal congestion with yellow colored drainage, facial pain, and ears bilat feel more fullness than usual. Denise fever. States throat is scratchy.

## 2011-10-30 NOTE — Discharge Instructions (Signed)

## 2011-10-30 NOTE — ED Provider Notes (Signed)
History     CSN: [id number]  Arrival date & time 10/30/11  1022   First MD Initiated Contact with Patient 10/30/11 1038      Chief Complaint  Patient presents with  . Nasal Congestion  . Facial Pain    (Consider location/radiation/quality/duration/timing/severity/associated sxs/prior treatment) Patient is a 56 y.o. male presenting with sinusitis. The history is provided by the patient.  Sinusitis  This is a new problem. The current episode started yesterday. The problem has been gradually worsening. There has been no fever. The pain has been fluctuating since onset. Associated symptoms include sinus pressure and cough. Pertinent negatives include no chills and no ear pain. He has tried nothing for the symptoms. The treatment provided no relief.    Past Medical History  Diagnosis Date  . GERD (gastroesophageal reflux disease)   . Hearing loss     bilateral   . Anxiety   . Hypertension   . Perianal pain   . Allergy   . Tinnitus   . Personal history of colonic polyps   . MVP (mitral valve prolapse)   . DJD (degenerative joint disease), cervical   . Inguinal hernia     left  . Insomnia   . Depression     Past Surgical History  Procedure Date  . Carpal tunnel release 10/06, 5/10    right wrist   . Cervical spine discectomy  2/07  . Carpal tunnel release 3/10    left wrist   . Tonsillectomy 1960  . Inguinal hernia repair 08/16/2011    Procedure: HERNIA REPAIR INGUINAL ADULT;  Surgeon: Valarie Merino, MD;  Location: [facility];  Service: General;  Laterality: Left;    Family History  Problem Relation Age of Onset  . Cancer Mother     breast    History  Substance Use Topics  . Smoking status: Never Smoker   . Smokeless tobacco: Never Used  . Alcohol Use: 1.2 oz/week    2 Glasses of wine per week      Review of Systems  Constitutional: Negative for chills.  HENT: Positive for sinus pressure. Negative for ear pain.   Respiratory: Positive  for cough.     Allergies  Review of patient's allergies indicates no known allergies.  Home Medications   Current Outpatient Rx  Name Route Sig Dispense Refill  . ASPIRIN 81 MG PO TABS Oral Take 81 mg by mouth daily.      . BUSPIRONE HCL 10 MG PO TABS Oral Take 20 mg by mouth 3 (three) times daily.     Marland Kitchen LORAZEPAM 1 MG PO TABS Oral Take 1 mg by mouth every 8 (eight) hours.      . OMEPRAZOLE 20 MG PO CPDR Oral Take 20 mg by mouth daily.      . TRAZODONE HCL 100 MG PO TABS Oral Take 100 mg by mouth at bedtime.    Marland Kitchen VALSARTAN-HYDROCHLOROTHIAZIDE 160-12.5 MG PO TABS Oral Take 1 tablet by mouth daily. 1/2    . ZOLPIDEM TARTRATE ER 12.5 MG PO TBCR Oral Take 12.5 mg by mouth at bedtime as needed.      . ACYCLOVIR 800 MG PO TABS Oral Take 800 mg by mouth 2 (two) times daily.      Marland Kitchen DOXEPIN HCL 10 MG PO CAPS  10 mg at bedtime. Takes 2    . FLUTICASONE PROPIONATE 50 MCG/ACT NA SUSP  Ad lib.    Marland Kitchen SILDENAFIL CITRATE 100 MG PO TABS Oral  Take 100 mg by mouth daily as needed.      Marland Kitchen ZALEPLON 10 MG PO CAPS Oral Take 10 mg by mouth at bedtime.       BP 118/75  Pulse 69  Temp(Src) 97.6 F (36.4 C) (Oral)  Resp 20  SpO2 96%  Physical Exam  Nursing note and vitals reviewed. Constitutional: He is oriented to person, place, and time. He appears well-developed and well-nourished. No distress.  HENT:  Head: Normocephalic and atraumatic.  Right Ear: Tympanic membrane normal.  Left Ear: Tympanic membrane normal.  Nose: Nose normal.  Mouth/Throat: Oropharynx is clear and moist. No oropharyngeal exudate.  Eyes: Right eye exhibits no discharge. Left eye exhibits no discharge. No scleral icterus.  Neck: Normal range of motion. Neck supple. No thyromegaly present.  Cardiovascular: Normal rate, regular rhythm and normal heart sounds.   Pulmonary/Chest: No respiratory distress. He has no wheezes. He has rhonchi. He has no rales.  Lymphadenopathy:    He has no cervical adenopathy.  Neurological: He is  alert and oriented to person, place, and time.  Skin: Skin is warm and dry.  Psychiatric: He has a normal mood and affect.    ED Course  Procedures (including critical care time)   Sinusitis     MDM          Hassan Rowan, MD 10/30/11 1208

## 2011-11-04 ENCOUNTER — Encounter (INDEPENDENT_AMBULATORY_CARE_PROVIDER_SITE_OTHER): Payer: Self-pay | Admitting: Surgery

## 2011-11-04 ENCOUNTER — Ambulatory Visit (INDEPENDENT_AMBULATORY_CARE_PROVIDER_SITE_OTHER): Payer: BC Managed Care – PPO | Admitting: Surgery

## 2011-11-04 VITALS — BP 128/88 | HR 96 | Temp 99.8°F | Resp 12 | Ht 70.0 in | Wt 153.4 lb

## 2011-11-04 DIAGNOSIS — K4090 Unilateral inguinal hernia, without obstruction or gangrene, not specified as recurrent: Secondary | ICD-10-CM

## 2011-11-04 NOTE — Progress Notes (Signed)
Brad [name] 56 y.o.  Body mass index is 22.01 kg/(m^2).  Patient Active Problem List  Diagnoses  . Inguinal hernia, right-repair [facility] Dec 2012    No Known Allergies  Past Surgical History  Procedure Date  . Carpal tunnel release 10/06, 5/10    right wrist   . Cervical spine discectomy  2/07  . Carpal tunnel release 3/10    left wrist   . Tonsillectomy 1960  . Inguinal hernia repair 08/16/2011    Procedure: HERNIA REPAIR INGUINAL ADULT;  Surgeon: Valarie Merino, MD;  Location:  SUR[facility]ERY C[facility];  Service: General;  Laterality: Left;   Brad [name],Brad NEVILL, MD, MD No diagnosis found.  Incision healed and repair is intact.  Doing well after [facility] with mesh [name], MD, [facility] [phone] beeper [phone]  11/04/2011 9:37 AM

## 2011-12-02 ENCOUNTER — Ambulatory Visit (INDEPENDENT_AMBULATORY_CARE_PROVIDER_SITE_OTHER): Payer: BC Managed Care – PPO | Admitting: Surgery

## 2011-12-02 ENCOUNTER — Encounter (INDEPENDENT_AMBULATORY_CARE_PROVIDER_SITE_OTHER): Payer: Self-pay | Admitting: Surgery

## 2011-12-02 VITALS — BP 138/80 | HR 84 | Temp 96.9°F | Resp 16 | Ht 70.0 in | Wt 153.0 lb

## 2011-12-02 DIAGNOSIS — K4090 Unilateral inguinal hernia, without obstruction or gangrene, not specified as recurrent: Secondary | ICD-10-CM

## 2011-12-02 NOTE — Progress Notes (Signed)
Brad [name] 56 y.o.  Body mass index is 21.95 kg/(m^2).  Patient Active Problem List  Diagnoses  . Inguinal hernia, left-repair [facility] Dec 2012    No Known Allergies  Past Surgical History  Procedure Date  . Carpal tunnel release 10/06, 5/10    right wrist   . Cervical spine discectomy  2/07  . Carpal tunnel release 3/10    left wrist   . Tonsillectomy 1960  . Inguinal hernia repair 08/16/2011    Procedure: HERNIA REPAIR INGUINAL ADULT;  Surgeon: Valarie Merino, MD;  Location: [facility];  Service: General;  Laterality: Left;   GATES,ROBERT NEVILL, MD, MD No diagnosis found.  [name] strained with a BM and had pain in the left groin.  Strained a lot.   PE showed no recurrence.  Testes OK.  REpair intact.  REturn 3 months [name], MD, [facility] [phone] beeper [phone]  12/02/2011 10:41 AM

## 2011-12-17 ENCOUNTER — Other Ambulatory Visit: Payer: Self-pay | Admitting: Neurosurgery

## 2011-12-17 DIAGNOSIS — M5410 Radiculopathy, site unspecified: Secondary | ICD-10-CM

## 2011-12-17 DIAGNOSIS — M542 Cervicalgia: Secondary | ICD-10-CM

## 2011-12-23 ENCOUNTER — Encounter (INDEPENDENT_AMBULATORY_CARE_PROVIDER_SITE_OTHER): Payer: BC Managed Care – PPO | Admitting: Surgery

## 2011-12-31 ENCOUNTER — Other Ambulatory Visit: Payer: BC Managed Care – PPO

## 2011-12-31 ENCOUNTER — Inpatient Hospital Stay: Admission: RE | Admit: 2011-12-31 | Payer: BC Managed Care – PPO | Source: Ambulatory Visit

## 2012-01-25 ENCOUNTER — Telehealth (INDEPENDENT_AMBULATORY_CARE_PROVIDER_SITE_OTHER): Payer: Self-pay | Admitting: General Surgery

## 2012-01-25 NOTE — Telephone Encounter (Signed)
Returned call based on message left. Patient did not know he was receiving a call about future appointment. Patient is a post op inguinal hernia repair on 08/16/11. Left message on cell advising it Dr. Ermalene Searing office following up his procedure. Advised if there were any questions, or if he wanted to confirm of cancel the appointment to please call back to advise. Voicemail greeting of cell confirmed contact number was correct.

## 2012-03-24 ENCOUNTER — Encounter (INDEPENDENT_AMBULATORY_CARE_PROVIDER_SITE_OTHER): Payer: BC Managed Care – PPO | Admitting: Surgery

## 2012-05-23 ENCOUNTER — Other Ambulatory Visit (HOSPITAL_COMMUNITY): Payer: Self-pay | Admitting: Internal Medicine

## 2012-05-23 DIAGNOSIS — R0989 Other specified symptoms and signs involving the circulatory and respiratory systems: Secondary | ICD-10-CM

## 2012-05-31 ENCOUNTER — Other Ambulatory Visit (HOSPITAL_COMMUNITY): Payer: Self-pay | Admitting: Radiology

## 2012-05-31 ENCOUNTER — Ambulatory Visit (HOSPITAL_COMMUNITY)
Admission: RE | Admit: 2012-05-31 | Discharge: 2012-05-31 | Disposition: A | Discharge status: Home / Self Care | Payer: BC Managed Care – PPO | Source: Ambulatory Visit | Attending: Internal Medicine | Admitting: Internal Medicine

## 2012-05-31 DIAGNOSIS — J988 Other specified respiratory disorders: Secondary | ICD-10-CM | POA: Insufficient documentation

## 2012-05-31 MED ORDER — ALBUTEROL SULFATE (5 MG/ML) 0.5% IN NEBU
2.5000 mg | INHALATION_SOLUTION | Freq: Once | RESPIRATORY_TRACT | Status: AC
  Administered 2012-05-31: 2.5 mg via RESPIRATORY_TRACT

## 2013-10-12 ENCOUNTER — Other Ambulatory Visit: Payer: Self-pay | Admitting: Internal Medicine

## 2013-10-12 ENCOUNTER — Ambulatory Visit
Admission: RE | Admit: 2013-10-12 | Discharge: 2013-10-12 | Disposition: A | Discharge status: Home / Self Care | Payer: BC Managed Care – PPO | Source: Ambulatory Visit | Attending: Internal Medicine | Admitting: Internal Medicine

## 2013-10-12 DIAGNOSIS — R05 Cough: Secondary | ICD-10-CM

## 2013-10-12 DIAGNOSIS — R059 Cough, unspecified: Secondary | ICD-10-CM

## 2013-10-12 DIAGNOSIS — T17908A Unspecified foreign body in respiratory tract, part unspecified causing other injury, initial encounter: Secondary | ICD-10-CM

## 2014-04-10 ENCOUNTER — Encounter (HOSPITAL_COMMUNITY): Payer: Self-pay | Admitting: Emergency Medicine

## 2014-04-10 ENCOUNTER — Emergency Department (HOSPITAL_COMMUNITY)
Admission: EM | Admit: 2014-04-10 | Discharge: 2014-04-10 | Disposition: A | Discharge status: Home / Self Care | Payer: BC Managed Care – PPO | Attending: Emergency Medicine | Admitting: Emergency Medicine

## 2014-04-10 ENCOUNTER — Telehealth (INDEPENDENT_AMBULATORY_CARE_PROVIDER_SITE_OTHER): Payer: Self-pay | Admitting: General Surgery

## 2014-04-10 DIAGNOSIS — Z7982 Long term (current) use of aspirin: Secondary | ICD-10-CM | POA: Insufficient documentation

## 2014-04-10 DIAGNOSIS — IMO0002 Reserved for concepts with insufficient information to code with codable children: Secondary | ICD-10-CM | POA: Insufficient documentation

## 2014-04-10 DIAGNOSIS — K219 Gastro-esophageal reflux disease without esophagitis: Secondary | ICD-10-CM | POA: Diagnosis not present

## 2014-04-10 DIAGNOSIS — F411 Generalized anxiety disorder: Secondary | ICD-10-CM | POA: Diagnosis not present

## 2014-04-10 DIAGNOSIS — Z79899 Other long term (current) drug therapy: Secondary | ICD-10-CM | POA: Insufficient documentation

## 2014-04-10 DIAGNOSIS — Z860100 Personal history of colon polyps, unspecified: Secondary | ICD-10-CM | POA: Insufficient documentation

## 2014-04-10 DIAGNOSIS — F3289 Other specified depressive episodes: Secondary | ICD-10-CM | POA: Diagnosis not present

## 2014-04-10 DIAGNOSIS — R109 Unspecified abdominal pain: Secondary | ICD-10-CM | POA: Insufficient documentation

## 2014-04-10 DIAGNOSIS — K4090 Unilateral inguinal hernia, without obstruction or gangrene, not specified as recurrent: Secondary | ICD-10-CM | POA: Diagnosis not present

## 2014-04-10 DIAGNOSIS — I10 Essential (primary) hypertension: Secondary | ICD-10-CM | POA: Insufficient documentation

## 2014-04-10 DIAGNOSIS — H9190 Unspecified hearing loss, unspecified ear: Secondary | ICD-10-CM | POA: Diagnosis not present

## 2014-04-10 DIAGNOSIS — F329 Major depressive disorder, single episode, unspecified: Secondary | ICD-10-CM | POA: Insufficient documentation

## 2014-04-10 DIAGNOSIS — N509 Disorder of male genital organs, unspecified: Secondary | ICD-10-CM | POA: Diagnosis not present

## 2014-04-10 DIAGNOSIS — Z8739 Personal history of other diseases of the musculoskeletal system and connective tissue: Secondary | ICD-10-CM | POA: Insufficient documentation

## 2014-04-10 MED ORDER — IBUPROFEN 800 MG PO TABS: 800.0000 mg | ORAL_TABLET | Freq: Three times a day (TID) | ORAL | Status: DC

## 2014-04-10 NOTE — Telephone Encounter (Signed)
Pt called stating he had surgery on a L ing hernia 2 years ago with Dr Daphine DeutscherMartin.  He has had pain worsening over the afternoon on his right side and noticed a bulge in his groin this evening that is tender to touch.  I recommended that he go to the ED to have this evaluated and reduced, as it sounds like an incarcerated inguinal hernia.

## 2014-04-10 NOTE — Consult Note (Signed)
Chief Complaint  Patient presents with  . Groin Pain    HISTORY:  Brad [name] is a 58 y.o. male who presents to the ED with R groin pain that started earlier today.  He noticed a bulge in his R groin when he got home from work.  He is here to have this evaluated.  He has never had this pain before on this side.  He did know he had a small hernia there.  He is 2 yrs s/p L inguinal hernia repair by Dr Daphine DeutscherMartin.    Past Medical History  Diagnosis Date  . GERD (gastroesophageal reflux disease)   . Hearing loss     bilateral   . Anxiety   . Hypertension   . Perianal pain   . Allergy   . Tinnitus   . Personal history of colonic polyps   . MVP (mitral valve prolapse)   . DJD (degenerative joint disease), cervical   . Inguinal hernia     left  . Insomnia   . Depression        Past Surgical History  Procedure Laterality Date  . Carpal tunnel release  10/06, 5/10    right wrist   . Cervical spine discectomy   2/07  . Carpal tunnel release  3/10    left wrist   . Tonsillectomy  1960  . Inguinal hernia repair  08/16/2011    Procedure: HERNIA REPAIR INGUINAL ADULT;  Surgeon: Valarie MerinoMatthew B Martin, MD;  Location: [facility];  Service: General;  Laterality: Left;      No current facility-administered medications for this encounter.   Current Outpatient Prescriptions  Medication Sig Dispense Refill  . acyclovir (ZOVIRAX) 800 MG tablet Take 800 mg by mouth 2 (two) times daily.       . Alpha-Lipoic Acid 100 MG CAPS Take 1 capsule by mouth every morning.      . busPIRone (BUSPAR) 10 MG tablet Take 20 mg by mouth 3 (three) times daily.       . Cholecalciferol (VITAMIN D-3) 5000 UNITS TABS Take 1 tablet by mouth daily.      Marland Kitchen. co-enzyme Q-10 50 MG capsule Take 50 mg by mouth every morning.      Marland Kitchen. DHEA 25 MG CAPS Take 1 capsule by mouth every morning.      Marland Kitchen. doxepin (SINEQUAN) 10 MG capsule Take 20 mg by mouth at bedtime. Takes 2      . LORazepam (ATIVAN) 0.5 MG tablet Take 1 mg by  mouth 2 (two) times daily.      . Omega-3 Fatty Acids (FISH OIL) 1200 MG CAPS Take 2 capsules by mouth every morning.      Marland Kitchen. omeprazole (PRILOSEC) 20 MG capsule Take 20 mg by mouth daily as needed (indegestion.).       Marland Kitchen. propranolol (INDERAL) 10 MG tablet Take 10 mg by mouth daily as needed (heart rate.).      Marland Kitchen. sildenafil (VIAGRA) 100 MG tablet Take 50 mg by mouth as needed.       . Suvorexant (BELSOMRA) 20 MG TABS Take 1 tablet by mouth at bedtime.      . valsartan-hydrochlorothiazide (DIOVAN-HCT) 160-12.5 MG per tablet Take 0.5 tablets by mouth daily.       . zaleplon (SONATA) 10 MG capsule Take 10 mg by mouth at bedtime as needed for sleep.       Marland Kitchen. Zinc 50 MG CAPS Take 1 capsule by mouth every morning.      .Marland Kitchen  zolpidem (AMBIEN CR) 12.5 MG CR tablet Take 12.5 mg by mouth at bedtime as needed.        . fluticasone (FLONASE) 50 MCG/ACT nasal spray Place 2 sprays into the nose daily.  16 g  2  . ibuprofen (ADVIL,MOTRIN) 800 MG tablet Take 1 tablet (800 mg total) by mouth 3 (three) times daily.  21 tablet  0     No Known Allergies    Family History  Problem Relation Age of Onset  . Cancer Mother     breast      History   Social History  . Marital Status: Divorced    Spouse Name: N/A    Number of Children: N/A  . Years of Education: N/A   Social History Main Topics  . Smoking status: Never Smoker   . Smokeless tobacco: Never Used  . Alcohol Use: 1.2 oz/week    2 Glasses of wine per week  . Drug Use: Yes    Special: Marijuana     Comment: occ   . Sexual Activity: Yes   Other Topics Concern  . None   Social History Narrative  . None       REVIEW OF SYSTEMS - PERTINENT POSITIVES ONLY: Review of Systems - General ROS: negative for - chills or fever Respiratory ROS: no cough, shortness of breath, or wheezing Cardiovascular ROS: no chest pain or dyspnea on exertion Gastrointestinal ROS: positive for - abdominal pain and nausea negative for - change in bowel  habits Genito-Urinary ROS: no dysuria, trouble voiding, or hematuria  EXAM: Filed Vitals:   04/10/14 2122  BP: 132/95  Pulse: 58  Temp: 98.5 F (36.9 C)  Resp: 18    General appearance: alert and cooperative Resp: clear to auscultation bilaterally Cardio: regular rate and rhythm GI: normal findings: soft, non-tender and abnormal findings:  R inguinal hernia palpated, reducible   ASSESSMENT AND PLAN:  Brad [name] is a 58 y.o. M pt of Dr Ermalene Searing with a newly symptomatic right sided inguinal hernia.  I recommended ice and NSAIDs for the next 24-48 h.  He should f/u with Dr Daphine Deutscher in the office to discuss elective repair.  Patient was counseled to call if his hernia became non-reducible again.     Vanita Panda, MD Colon and Rectal Surgery / General Surgery [facility]      Visit Diagnoses: 1. Reducible right inguinal hernia     Primary Care Physician: Pearla Dubonnet, MD

## 2014-04-10 NOTE — ED Provider Notes (Signed)
CSN: [id number]     Arrival date & time 04/10/14  1907 History   First MD Initiated Contact with Patient 04/10/14 1956     Chief Complaint  Patient presents with  . Groin Pain     (Consider location/radiation/quality/duration/timing/severity/associated sxs/prior Treatment) HPI  58 year old male with history of left inguinal hernia with elective surgical repair 2 years ago by Dr. Daphine Deutscher. He was also told that he had a right inguinal hernia that has not been causing him any symptoms. Today he noticed a bulge to his right inguinal region approximately 3 hrs ago.  Pain is sharp, tearing, radiates to R scrotum, 10/10, sudden onset.  Report rectal pressure and having discomfort with passing flatus.  Report feeling nausea.  He called [facility] and spoke with oncall surgeon, Dr. Maisie Fus who recommend pt to come to ER for further evaluation.  Otherwise no fever, v/d, back pain, dysuria, hematuria, penile discharge.  Denies any strenuous activities.  Pt is a Warden/ranger.    Past Medical History  Diagnosis Date  . GERD (gastroesophageal reflux disease)   . Hearing loss     bilateral   . Anxiety   . Hypertension   . Perianal pain   . Allergy   . Tinnitus   . Personal history of colonic polyps   . MVP (mitral valve prolapse)   . DJD (degenerative joint disease), cervical   . Inguinal hernia     left  . Insomnia   . Depression    Past Surgical History  Procedure Laterality Date  . Carpal tunnel release  10/06, 5/10    right wrist   . Cervical spine discectomy   2/07  . Carpal tunnel release  3/10    left wrist   . Tonsillectomy  1960  . Inguinal hernia repair  08/16/2011    Procedure: HERNIA REPAIR INGUINAL ADULT;  Surgeon: Valarie Merino, MD;  [facility];  Service: General;  Laterality: Left;   Family History  Problem Relation Age of Onset  . Cancer Mother     breast   History  Substance Use Topics  . Smoking status: Never Smoker   . Smokeless tobacco: Never  Used  . Alcohol Use: 1.2 oz/week    2 Glasses of wine per week    Review of Systems  Constitutional: Negative for fever.  Genitourinary: Positive for scrotal swelling and testicular pain. Negative for discharge and penile pain.  Neurological: Negative for numbness.      Allergies  Review of patient's allergies indicates no known allergies.  Home Medications   Prior to Admission medications   Medication Sig Start Date End Date Taking? Authorizing Provider  acyclovir (ZOVIRAX) 800 MG tablet Take 800 mg by mouth 2 (two) times daily.      Historical Provider, MD  aspirin 81 MG tablet Take 81 mg by mouth daily.      Historical Provider, MD  busPIRone (BUSPAR) 10 MG tablet Take 20 mg by mouth 3 (three) times daily.     Historical Provider, MD  doxepin (SINEQUAN) 10 MG capsule 10 mg at bedtime. Takes 2 05/14/11   Historical Provider, MD  fluticasone Aleda Grana) 50 MCG/ACT nasal spray Ad lib. 07/12/11   Historical Provider, MD  fluticasone (FLONASE) 50 MCG/ACT nasal spray Place 2 sprays into the nose daily. 10/30/11 10/29/12  Hassan Rowan, MD  LORazepam (ATIVAN) 1 MG tablet Take 1 mg by mouth every 8 (eight) hours.      Historical Provider, MD  omeprazole (PRILOSEC)  20 MG capsule Take 20 mg by mouth daily.      Historical Provider, MD  sildenafil (VIAGRA) 100 MG tablet Take 100 mg by mouth daily as needed.      Historical Provider, MD  traZODone (DESYREL) 100 MG tablet Take 100 mg by mouth at bedtime.    Historical Provider, MD  valsartan-hydrochlorothiazide (DIOVAN-HCT) 160-12.5 MG per tablet Take 1 tablet by mouth daily. 1/2    Historical Provider, MD  zaleplon (SONATA) 10 MG capsule Take 10 mg by mouth at bedtime.     Historical Provider, MD  zolpidem (AMBIEN CR) 12.5 MG CR tablet Take 12.5 mg by mouth at bedtime as needed.      Historical Provider, MD  Zolpidem Tartrate (INTERMEZZO) 3.5 MG SUBL Place under the tongue.    Historical Provider, MD   BP 165/89  Pulse 69  Temp(Src) 97.8 F (36.6  C) (Oral)  Resp 20  SpO2 100% Physical Exam  Constitutional: He appears well-developed and well-nourished. No distress.  HENT:  Head: Atraumatic.  Eyes: Conjunctivae are normal.  Neck: Normal range of motion. Neck supple.  Abdominal: Soft. There is no tenderness.  Genitourinary:  R inguinal tenderness on palpation with reducible hernia.  No evidence of incaceration or strangulation.  No scrotal swelling, or rash.  Penile shaft normal, circumcised.    Neurological: He is alert.  Skin: No rash noted.  Psychiatric: He has a normal mood and affect.    ED Course  Procedures (including critical care time)  8:33 PM Pt with reducible R inguinal hernia.  No evidence of incarceration or strangulation.  Pt request to be seen by [facility], i have consulted with Dr. Maisie Fushomas who agrees to see him.  Care discussed with Dr. Micheline Mazeocherty.    9:22 PM Dr. Maisie Fushomas has evaluated pt and felt pt stable for discharge.  Recommend ice pack and NSAIDs as needed for pain.  Pt to f/u wit Dr. Daphine DeutscherMartin outpt for further care.  Standard return precaution discussed.  Pt agrees with plan.    Labs Review Labs Reviewed - No data to display  Imaging Review No results found.   EKG Interpretation None      MDM   Final diagnoses:  Reducible right inguinal hernia    BP 132/95  Pulse 58  Temp(Src) 98.5 F (36.9 C) (Oral)  Resp 18  SpO2 100%     Fayrene HelperBowie Jessenia Filippone, PA-C 04/10/14 2123

## 2014-04-10 NOTE — ED Notes (Signed)
Pt reports R groin pain, has hx of inguinal hernia.  Pt also reports Rectal pressure and burning sensation as well.

## 2014-04-10 NOTE — Discharge Instructions (Signed)
Please call Dr. Daphine Deutscher to schedule an outpatient follow up for further management of your hernia.  Follow instruction below.  Take ibuprofen for pain.  Apply ice pack to affected area as needed for pain.    Inguinal Hernia, Adult Muscles help keep everything in the body in its proper place. But if a weak spot in the muscles develops, something can poke through. That is called a hernia. When this happens in the lower part of the belly (abdomen), it is called an inguinal hernia. (It takes its name from a part of the body in this region called the inguinal canal.) A weak spot in the wall of muscles lets some fat or part of the small intestine bulge through. An inguinal hernia can develop at any age. Men get them more often than women. CAUSES  In adults, an inguinal hernia develops over time.  It can be triggered by:  Suddenly straining the muscles of the lower abdomen.  Lifting heavy objects.  Straining to have a bowel movement. Difficult bowel movements (constipation) can lead to this.  Constant coughing. This may be caused by smoking or lung disease.  Being overweight.  Being pregnant.  Working at a job that requires long periods of standing or heavy lifting.  Having had an inguinal hernia before. One type can be an emergency situation. It is called a strangulated inguinal hernia. It develops if part of the small intestine slips through the weak spot and cannot get back into the abdomen. The blood supply can be cut off. If that happens, part of the intestine may die. This situation requires emergency surgery. SYMPTOMS  Often, a small inguinal hernia has no symptoms. It is found when a healthcare provider does a physical exam. Larger hernias usually have symptoms.   In adults, symptoms may include:  A lump in the groin. This is easier to see when the person is standing. It might disappear when lying down.  In men, a lump in the scrotum.  Pain or burning in the groin. This occurs  especially when lifting, straining or coughing.  A dull ache or feeling of pressure in the groin.  Signs of a strangulated hernia can include:  A bulge in the groin that becomes very painful and tender to the touch.  A bulge that turns red or purple.  Fever, nausea and vomiting.  Inability to have a bowel movement or to pass gas. DIAGNOSIS  To decide if you have an inguinal hernia, a healthcare provider will probably do a physical examination.  This will include asking questions about any symptoms you have noticed.  The healthcare provider might feel the groin area and ask you to cough. If an inguinal hernia is felt, the healthcare provider may try to slide it back into the abdomen.  Usually no other tests are needed. TREATMENT  Treatments can vary. The size of the hernia makes a difference. Options include:  Watchful waiting. This is often suggested if the hernia is small and you have had no symptoms.  No medical procedure will be done unless symptoms develop.  You will need to watch closely for symptoms. If any occur, contact your healthcare provider right away.  Surgery. This is used if the hernia is larger or you have symptoms.  Open surgery. This is usually an outpatient procedure (you will not stay overnight in a hospital). An cut (incision) is made through the skin in the groin. The hernia is put back inside the abdomen. The weak area in the  muscles is then repaired by herniorrhaphy or hernioplasty. Herniorrhaphy: in this type of surgery, the weak muscles are sewn back together. Hernioplasty: a patch or mesh is used to close the weak area in the abdominal wall.  Laparoscopy. In this procedure, a surgeon makes small incisions. A thin tube with a tiny video camera (called a laparoscope) is put into the abdomen. The surgeon repairs the hernia with mesh by looking with the video camera and using two long instruments. HOME CARE INSTRUCTIONS   After surgery to repair an inguinal  hernia:  You will need to take pain medicine prescribed by your healthcare provider. Follow all directions carefully.  You will need to take care of the wound from the incision.  Your activity will be restricted for awhile. This will probably include no heavy lifting for several weeks. You also should not do anything too active for a few weeks. When you can return to work will depend on the type of job that you have.  During "watchful waiting" periods, you should:  Maintain a healthy weight.  Eat a diet high in fiber (fruits, vegetables and whole grains).  Drink plenty of fluids to avoid constipation. This means drinking enough water and other liquids to keep your urine clear or pale yellow.  Do not lift heavy objects.  Do not stand for long periods of time.  Quit smoking. This should keep you from developing a frequent cough. SEEK MEDICAL CARE IF:   A bulge develops in your groin area.  You feel pain, a burning sensation or pressure in the groin. This might be worse if you are lifting or straining.  You develop a fever of more than 100.5 F (38.1 C). SEEK IMMEDIATE MEDICAL CARE IF:   Pain in the groin increases suddenly.  A bulge in the groin gets bigger suddenly and does not go down.  For men, there is sudden pain in the scrotum. Or, the size of the scrotum increases.  A bulge in the groin area becomes red or purple and is painful to touch.  You have nausea or vomiting that does not go away.  You feel your heart beating much faster than normal.  You cannot have a bowel movement or pass gas.  You develop a fever of more than 102.0 F (38.9 C). Document Released: 01/02/2009 Document Revised: 11/08/2011 Document Reviewed: 01/02/2009 [facility] Patient Information 2015 [employer], [place]. This information is not intended to replace advice given to you by your health care provider. Make sure you discuss any questions you have with your health care provider.

## 2014-04-11 NOTE — ED Provider Notes (Signed)
Medical screening examination/treatment/procedure(s) were performed by non-physician practitioner and as supervising physician I was immediately available for consultation/collaboration.   Toy CookeyMegan Hilton Saephan, MD 04/11/14 [phone]

## 2014-04-12 ENCOUNTER — Encounter (INDEPENDENT_AMBULATORY_CARE_PROVIDER_SITE_OTHER): Payer: Self-pay | Admitting: Surgery

## 2014-04-12 ENCOUNTER — Ambulatory Visit (INDEPENDENT_AMBULATORY_CARE_PROVIDER_SITE_OTHER): Payer: BC Managed Care – PPO | Admitting: Surgery

## 2014-04-12 VITALS — BP 122/70 | HR 76 | Temp 98.0°F | Ht 70.0 in | Wt 159.0 lb

## 2014-04-12 DIAGNOSIS — K4090 Unilateral inguinal hernia, without obstruction or gangrene, not specified as recurrent: Secondary | ICD-10-CM

## 2014-04-12 MED ORDER — HYDROCODONE-ACETAMINOPHEN 5-325 MG PO TABS: 1.0000 | ORAL_TABLET | ORAL | Status: DC | PRN

## 2014-04-12 NOTE — Addendum Note (Signed)
Addended by: Luretha MurphyMARTIN, Quentin Shorey B on: 04/12/2014 05:00 PM   Modules accepted: Orders

## 2014-04-12 NOTE — Progress Notes (Signed)
Chief Complaint:  New right inguinal hernia  History of Present Illness:  Brad [name] is an 58 y.o. male know to me from prior [facility] done in 2012.  He needs an open RIH.  He understands the procedure and risk and the need for mesh. We will go and get this scheduled at his convenience. A booklet on hernia repair was given to him  Past Medical History  Diagnosis Date  . GERD (gastroesophageal reflux disease)   . Hearing loss     bilateral   . Anxiety   . Hypertension   . Perianal pain   . Allergy   . Tinnitus   . Personal history of colonic polyps   . MVP (mitral valve prolapse)   . DJD (degenerative joint disease), cervical   . Inguinal hernia     left  . Insomnia   . Depression     Past Surgical History  Procedure Laterality Date  . Carpal tunnel release  10/06, 5/10    right wrist   . Cervical spine discectomy   2/07  . Carpal tunnel release  3/10    left wrist   . Tonsillectomy  1960  . Inguinal hernia repair  08/16/2011    Procedure: HERNIA REPAIR INGUINAL ADULT;  Surgeon: Valarie Merino, MD;  Location: [facility];  Service: General;  Laterality: Left;    Current Outpatient Prescriptions  Medication Sig Dispense Refill  . acyclovir (ZOVIRAX) 800 MG tablet Take 800 mg by mouth 2 (two) times daily.       . Alpha-Lipoic Acid 100 MG CAPS Take 1 capsule by mouth every morning.      . busPIRone (BUSPAR) 10 MG tablet Take 20 mg by mouth 3 (three) times daily.       . Cholecalciferol (VITAMIN D-3) 5000 UNITS TABS Take 1 tablet by mouth daily.      Marland Kitchen co-enzyme Q-10 50 MG capsule Take 50 mg by mouth every morning.      Marland Kitchen DHEA 25 MG CAPS Take 1 capsule by mouth every morning.      Marland Kitchen doxepin (SINEQUAN) 10 MG capsule Take 20 mg by mouth at bedtime. Takes 2      . fluticasone (FLONASE) 50 MCG/ACT nasal spray Place 2 sprays into the nose daily.  16 g  2  . ibuprofen (ADVIL,MOTRIN) 800 MG tablet Take 1 tablet (800 mg total) by mouth 3 (three) times daily.  21 tablet  0   . LORazepam (ATIVAN) 0.5 MG tablet Take 1 mg by mouth 2 (two) times daily.      . Omega-3 Fatty Acids (FISH OIL) 1200 MG CAPS Take 2 capsules by mouth every morning.      Marland Kitchen omeprazole (PRILOSEC) 20 MG capsule Take 20 mg by mouth daily as needed (indegestion.).       Marland Kitchen propranolol (INDERAL) 10 MG tablet Take 10 mg by mouth daily as needed (heart rate.).      Marland Kitchen sildenafil (VIAGRA) 100 MG tablet Take 50 mg by mouth as needed.       . Suvorexant (BELSOMRA) 20 MG TABS Take 1 tablet by mouth at bedtime.      . valsartan-hydrochlorothiazide (DIOVAN-HCT) 160-12.5 MG per tablet Take 0.5 tablets by mouth daily.       . zaleplon (SONATA) 10 MG capsule Take 10 mg by mouth at bedtime as needed for sleep.       Marland Kitchen Zinc 50 MG CAPS Take 1 capsule by mouth every morning.      Marland Kitchen  zolpidem (AMBIEN CR) 12.5 MG CR tablet Take 12.5 mg by mouth at bedtime as needed.         No current facility-administered medications for this visit.   Review of patient's allergies indicates no known allergies. Family History  Problem Relation Age of Onset  . Cancer Mother     breast   Social History:   reports that he has never smoked. He has never used smokeless tobacco. He reports that he drinks about 1.2 ounces of alcohol per week. He reports that he uses illicit drugs (Marijuana).   REVIEW OF SYSTEMS : Negative except for pain associated with his inguinal hernia.  Physical Exam:   Blood pressure 122/70, pulse 76, temperature 98 F (36.7 C), height 5\' 10"  (1.778 m), weight 159 lb (72.122 kg). Body mass index is 22.81 kg/(m^2).  Gen:  WDWN white male NAD  Neurological: Alert and oriented to person, place, and time. Motor and sensory function is grossly intact  Head: Normocephalic and atraumatic.  Eyes: Conjunctivae are normal. Pupils are equal, round, and reactive to light. No scleral icterus.  Neck: Normal range of motion. Neck supple. No tracheal deviation or thyromegaly present.  Cardiovascular:  SR without murmurs  or gallops.  No carotid bruits Breast:  Not examined Respiratory: Effort normal.  No respiratory distress. No chest wall tenderness. Breath sounds normal.  No wheezes, rales or rhonchi.  Abdomen:  Not tender GU:  Bulge in right inguinal canal consistent with running hernia. Left inguinal hernia repair intact. Musculoskeletal: Normal range of motion. Extremities are nontender. No cyanosis, edema or clubbing noted Lymphadenopathy: No cervical, preauricular, postauricular or axillary adenopathy is present Skin: Skin is warm and dry. No rash noted. No diaphoresis. No erythema. No pallor. Pscyh: Normal mood and affect. Behavior is normal. Judgment and thought content normal.   LABORATORY RESULTS: No results found for this or any previous visit (from the past 48 hour(s)).   RADIOLOGY RESULTS: No results found.  Problem List: Patient Active Problem List   Diagnosis Date Noted  . [facility]    Assessment & Plan: New right inguinal hernia that is symptomatic. Will give him something for pain and schedule him for open right inguinal hernia under general.    Matt B. Daphine DeutscherMartin, MD, [facility]ACS  [facility] [phone] beeper [phone]  04/12/2014 4:53 PM

## 2014-04-12 NOTE — Patient Instructions (Signed)

## 2014-04-25 ENCOUNTER — Encounter (HOSPITAL_COMMUNITY): Payer: Self-pay | Admitting: Pharmacy Technician

## 2014-04-30 ENCOUNTER — Encounter (HOSPITAL_COMMUNITY): Payer: Self-pay

## 2014-04-30 ENCOUNTER — Encounter (HOSPITAL_COMMUNITY)
Admission: RE | Admit: 2014-04-30 | Discharge: 2014-04-30 | Disposition: A | Discharge status: Home / Self Care | Payer: BC Managed Care – PPO | Source: Ambulatory Visit | Attending: Surgery | Admitting: Surgery

## 2014-04-30 DIAGNOSIS — H9319 Tinnitus, unspecified ear: Secondary | ICD-10-CM | POA: Diagnosis not present

## 2014-04-30 DIAGNOSIS — F411 Generalized anxiety disorder: Secondary | ICD-10-CM | POA: Diagnosis not present

## 2014-04-30 DIAGNOSIS — I10 Essential (primary) hypertension: Secondary | ICD-10-CM | POA: Diagnosis not present

## 2014-04-30 DIAGNOSIS — M47812 Spondylosis without myelopathy or radiculopathy, cervical region: Secondary | ICD-10-CM | POA: Diagnosis not present

## 2014-04-30 DIAGNOSIS — H918X9 Other specified hearing loss, unspecified ear: Secondary | ICD-10-CM | POA: Diagnosis not present

## 2014-04-30 DIAGNOSIS — Z86010 Personal history of colonic polyps: Secondary | ICD-10-CM | POA: Diagnosis not present

## 2014-04-30 DIAGNOSIS — F3289 Other specified depressive episodes: Secondary | ICD-10-CM | POA: Diagnosis not present

## 2014-04-30 DIAGNOSIS — K4090 Unilateral inguinal hernia, without obstruction or gangrene, not specified as recurrent: Secondary | ICD-10-CM | POA: Diagnosis present

## 2014-04-30 DIAGNOSIS — K219 Gastro-esophageal reflux disease without esophagitis: Secondary | ICD-10-CM | POA: Diagnosis not present

## 2014-04-30 DIAGNOSIS — F329 Major depressive disorder, single episode, unspecified: Secondary | ICD-10-CM | POA: Diagnosis not present

## 2014-04-30 DIAGNOSIS — I059 Rheumatic mitral valve disease, unspecified: Secondary | ICD-10-CM | POA: Diagnosis not present

## 2014-04-30 HISTORY — DX: Acute amebic dysentery: A06.0

## 2014-04-30 HISTORY — DX: Personal history of other diseases of the respiratory system: Z87.09

## 2014-04-30 HISTORY — DX: Personal history of malaria: Z86.13

## 2014-04-30 LAB — CBC
HEMATOCRIT: 41.1 % (ref 39.0–52.0)
Hemoglobin: 14.6 g/dL (ref 13.0–17.0)
MCH: 31.7 pg (ref 26.0–34.0)
MCHC: 35.5 g/dL (ref 30.0–36.0)
MCV: 89.2 fL (ref 78.0–100.0)
Platelets: 228 10*3/uL (ref 150–400)
RBC: 4.61 MIL/uL (ref 4.22–5.81)
RDW: 13.4 % (ref 11.5–15.5)
WBC: 5.9 10*3/uL (ref 4.0–10.5)

## 2014-04-30 LAB — BASIC METABOLIC PANEL
Anion gap: 12 (ref 5–15)
BUN: 11 mg/dL (ref 6–23)
CO2: 28 meq/L (ref 19–32)
CREATININE: 0.92 mg/dL (ref 0.50–1.35)
Calcium: 10.1 mg/dL (ref 8.4–10.5)
Chloride: 96 mEq/L (ref 96–112)
GFR calc Af Amer: >90 mL/min (ref >90)
GFR calc non Af Amer: >90 mL/min (ref >90)
Glucose, Bld: 87 mg/dL (ref 70–99)
POTASSIUM: 4.7 meq/L (ref 3.7–5.3)
Sodium: 136 mEq/L — ABNORMAL LOW (ref 137–147)

## 2014-04-30 NOTE — Patient Instructions (Addendum)
[address] Brad [name] [facility]  04/30/2014   Your procedure is scheduled on: Friday 05/03/14  Report to [facility] at 07:15 AM.  Call this number if you have problems the morning of surgery 336-: [phone]   Remember: fleets enema the night before surgery    Do not eat food or drink liquids After Midnight.     Take these medicines the morning of surgery with A SIP OF WATER: propranolol if needed, lorazepam, hydrocodone if needed, buspirone    Do not wear jewelry, make-up or nail polish.  Do not wear lotions, powders, or perfumes. You may wear deodorant.  Do not shave 48 hours prior to surgery. Men may shave face and neck.  Do not bring valuables to the hospital.  Contacts, dentures or bridgework may not be worn into surgery.     Patients discharged the day of surgery will not be allowed to drive home.  Name and phone number of your driver: [name] (wife) [phone]   [name], RN  pre op nurse call if needed [phone]    [facility] - Preparing for Surgery Before surgery, you can play an important role.  Because skin is not sterile, your skin needs to be as free of germs as possible.  You can reduce the number of germs on your skin by washing with CHG (chlorahexidine gluconate) soap before surgery.  CHG is an antiseptic cleaner which kills germs and bonds with the skin to continue killing germs even after washing. Please DO NOT use if you have an allergy to CHG or antibacterial soaps.  If your skin becomes reddened/irritated stop using the CHG and inform your nurse when you arrive at Short Stay. Do not shave (including legs and underarms) for at least 48 hours prior to the first CHG shower.  You may shave your face/neck. Please follow these instructions carefully:  1.  Shower with CHG Soap the night before surgery and the  morning of Surgery.  2.  If you choose to wash your hair, wash your hair first as usual with your  normal  shampoo.  3.  After you shampoo, rinse  your hair and body thoroughly to remove the  shampoo.                            4.  Use CHG as you would any other liquid soap.  You can apply chg directly  to the skin and wash                       Gently with a scrungie or clean washcloth.  5.  Apply the CHG Soap to your body ONLY FROM THE NECK DOWN.   Do not use on face/ open                           Wound or open sores. Avoid contact with eyes, ears mouth and genitals (private parts).                       Wash face,  Genitals (private parts) with your normal soap.             6.  Wash thoroughly, paying special attention to the area where your surgery  will be performed.  7.  Thoroughly rinse your body with warm water from the neck down.  8.  DO  NOT shower/wash with your normal soap after using and rinsing off  the CHG Soap.                9.  Pat yourself dry with a clean towel.            10.  Wear clean pajamas.            11.  Place clean sheets on your bed the night of your first shower and do not  sleep with pets. Day of Surgery : Do not apply any lotions/deodorants the morning of surgery.  Please wear clean clothes to the hospital/surgery center.  FAILURE TO FOLLOW THESE INSTRUCTIONS MAY RESULT IN THE CANCELLATION OF YOUR SURGERY PATIENT SIGNATURE_________________________________  NURSE SIGNATURE__________________________________  ________________________________________________________________________

## 2014-04-30 NOTE — Progress Notes (Signed)
Chest x-ray 10/12/13 on EP[facility]

## 2014-05-03 ENCOUNTER — Encounter (HOSPITAL_COMMUNITY): Payer: Self-pay | Admitting: *Deleted

## 2014-05-03 ENCOUNTER — Ambulatory Visit (HOSPITAL_COMMUNITY)
Admission: RE | Admit: 2014-05-03 | Discharge: 2014-05-03 | Disposition: A | Discharge status: Home / Self Care | Payer: BC Managed Care – PPO | Source: Ambulatory Visit | Attending: Surgery | Admitting: Surgery

## 2014-05-03 ENCOUNTER — Ambulatory Visit (HOSPITAL_COMMUNITY): Payer: BC Managed Care – PPO | Admitting: Anesthesiology

## 2014-05-03 ENCOUNTER — Encounter (HOSPITAL_COMMUNITY)
Admission: RE | Disposition: A | Discharge status: Home / Self Care | Payer: Self-pay | Source: Ambulatory Visit | Attending: Surgery

## 2014-05-03 ENCOUNTER — Encounter (HOSPITAL_COMMUNITY): Payer: BC Managed Care – PPO | Admitting: Anesthesiology

## 2014-05-03 DIAGNOSIS — K4090 Unilateral inguinal hernia, without obstruction or gangrene, not specified as recurrent: Secondary | ICD-10-CM | POA: Diagnosis not present

## 2014-05-03 DIAGNOSIS — I10 Essential (primary) hypertension: Secondary | ICD-10-CM | POA: Insufficient documentation

## 2014-05-03 DIAGNOSIS — F411 Generalized anxiety disorder: Secondary | ICD-10-CM | POA: Insufficient documentation

## 2014-05-03 DIAGNOSIS — I059 Rheumatic mitral valve disease, unspecified: Secondary | ICD-10-CM | POA: Insufficient documentation

## 2014-05-03 DIAGNOSIS — M47812 Spondylosis without myelopathy or radiculopathy, cervical region: Secondary | ICD-10-CM | POA: Insufficient documentation

## 2014-05-03 DIAGNOSIS — F3289 Other specified depressive episodes: Secondary | ICD-10-CM | POA: Insufficient documentation

## 2014-05-03 DIAGNOSIS — H9319 Tinnitus, unspecified ear: Secondary | ICD-10-CM | POA: Insufficient documentation

## 2014-05-03 DIAGNOSIS — Z860100 Personal history of colon polyps, unspecified: Secondary | ICD-10-CM | POA: Insufficient documentation

## 2014-05-03 DIAGNOSIS — H918X9 Other specified hearing loss, unspecified ear: Secondary | ICD-10-CM | POA: Insufficient documentation

## 2014-05-03 DIAGNOSIS — K219 Gastro-esophageal reflux disease without esophagitis: Secondary | ICD-10-CM | POA: Insufficient documentation

## 2014-05-03 DIAGNOSIS — F329 Major depressive disorder, single episode, unspecified: Secondary | ICD-10-CM | POA: Insufficient documentation

## 2014-05-03 HISTORY — PX: INGUINAL HERNIA REPAIR: SHX194

## 2014-05-03 HISTORY — PX: INSERTION OF MESH: SHX5868

## 2014-05-03 SURGERY — REPAIR, HERNIA, INGUINAL, ADULT
Anesthesia: General | Site: Groin | Laterality: Right

## 2014-05-03 MED ORDER — FENTANYL CITRATE 0.05 MG/ML IJ SOLN
INTRAMUSCULAR | Status: AC
  Filled 2014-05-03: qty 2

## 2014-05-03 MED ORDER — BUPIVACAINE LIPOSOME 1.3 % IJ SUSP
20.0000 mL | Freq: Once | INTRAMUSCULAR | Status: DC
  Filled 2014-05-03: qty 20

## 2014-05-03 MED ORDER — GLYCOPYRROLATE 0.2 MG/ML IJ SOLN
INTRAMUSCULAR | Status: DC | PRN
Start: 2014-05-03 — End: 2014-05-03
  Administered 2014-05-03: 0.2 mg via INTRAVENOUS

## 2014-05-03 MED ORDER — CEFAZOLIN SODIUM-DEXTROSE 2-3 GM-% IV SOLR
INTRAVENOUS | Status: AC
Start: 2014-05-03 — End: 2014-05-03
  Filled 2014-05-03: qty 50

## 2014-05-03 MED ORDER — BUPIVACAINE LIPOSOME 1.3 % IJ SUSP
INTRAMUSCULAR | Status: DC | PRN
  Administered 2014-05-03: 20 mL

## 2014-05-03 MED ORDER — ACETAMINOPHEN 650 MG RE SUPP
650.0000 mg | RECTAL | Status: DC | PRN
  Filled 2014-05-03: qty 1

## 2014-05-03 MED ORDER — FENTANYL CITRATE 0.05 MG/ML IJ SOLN
INTRAMUSCULAR | Status: AC
  Filled 2014-05-03: qty 5

## 2014-05-03 MED ORDER — SODIUM CHLORIDE 0.9 % IJ SOLN: 3.0000 mL | INTRAMUSCULAR | Status: DC | PRN

## 2014-05-03 MED ORDER — HEPARIN SODIUM (PORCINE) 5000 UNIT/ML IJ SOLN
5000.0000 [IU] | Freq: Once | INTRAMUSCULAR | Status: AC
  Administered 2014-05-03: 5000 [IU] via SUBCUTANEOUS
  Filled 2014-05-03: qty 1

## 2014-05-03 MED ORDER — PROPOFOL 10 MG/ML IV BOLUS
INTRAVENOUS | Status: DC | PRN
  Administered 2014-05-03: 200 mg via INTRAVENOUS

## 2014-05-03 MED ORDER — LACTATED RINGERS IV SOLN
INTRAVENOUS | Status: DC
  Administered 2014-05-03: 1 via INTRAVENOUS

## 2014-05-03 MED ORDER — HYDROCODONE-ACETAMINOPHEN 5-325 MG PO TABS
1.0000 | ORAL_TABLET | ORAL | Status: DC | PRN
Start: 2014-05-03 — End: 2015-10-13

## 2014-05-03 MED ORDER — ACETAMINOPHEN 325 MG PO TABS: 650.0000 mg | ORAL_TABLET | ORAL | Status: DC | PRN

## 2014-05-03 MED ORDER — 0.9 % SODIUM CHLORIDE (POUR BTL) OPTIME
TOPICAL | Status: DC | PRN
  Administered 2014-05-03: 1000 mL

## 2014-05-03 MED ORDER — DIPHENHYDRAMINE HCL 25 MG PO CAPS
25.0000 mg | ORAL_CAPSULE | Freq: Once | ORAL | Status: AC
  Administered 2014-05-03: 25 mg via ORAL
  Filled 2014-05-03: qty 1

## 2014-05-03 MED ORDER — PROPOFOL 10 MG/ML IV BOLUS
INTRAVENOUS | Status: AC
  Filled 2014-05-03: qty 20

## 2014-05-03 MED ORDER — HYDROMORPHONE HCL PF 1 MG/ML IJ SOLN
INTRAMUSCULAR | Status: AC
  Filled 2014-05-03: qty 1

## 2014-05-03 MED ORDER — SODIUM CHLORIDE 0.9 % IV SOLN: 250.0000 mL | INTRAVENOUS | Status: DC | PRN

## 2014-05-03 MED ORDER — FENTANYL CITRATE 0.05 MG/ML IJ SOLN
25.0000 ug | INTRAMUSCULAR | Status: DC | PRN
  Administered 2014-05-03 (×3): 50 ug via INTRAVENOUS

## 2014-05-03 MED ORDER — OXYCODONE HCL 5 MG PO TABS
5.0000 mg | ORAL_TABLET | ORAL | Status: DC | PRN
  Administered 2014-05-03: 5 mg via ORAL
  Filled 2014-05-03: qty 1

## 2014-05-03 MED ORDER — MIDAZOLAM HCL 2 MG/2ML IJ SOLN
INTRAMUSCULAR | Status: AC
  Filled 2014-05-03: qty 2

## 2014-05-03 MED ORDER — LIDOCAINE HCL (CARDIAC) 20 MG/ML IV SOLN
INTRAVENOUS | Status: DC | PRN
  Administered 2014-05-03: 60 mg via INTRAVENOUS

## 2014-05-03 MED ORDER — DIPHENHYDRAMINE HCL 25 MG PO TABS: 25.0000 mg | ORAL_TABLET | Freq: Once | ORAL | Status: DC

## 2014-05-03 MED ORDER — LIDOCAINE HCL (CARDIAC) 20 MG/ML IV SOLN
INTRAVENOUS | Status: AC
  Filled 2014-05-03: qty 5

## 2014-05-03 MED ORDER — PROMETHAZINE HCL 25 MG/ML IJ SOLN: 6.2500 mg | INTRAMUSCULAR | Status: DC | PRN

## 2014-05-03 MED ORDER — ONDANSETRON HCL 4 MG/2ML IJ SOLN
INTRAMUSCULAR | Status: AC
  Filled 2014-05-03: qty 2

## 2014-05-03 MED ORDER — ACETAMINOPHEN 10 MG/ML IV SOLN
1000.0000 mg | Freq: Once | INTRAVENOUS | Status: AC
  Administered 2014-05-03: 1000 mg via INTRAVENOUS
  Filled 2014-05-03: qty 100

## 2014-05-03 MED ORDER — SODIUM CHLORIDE 0.9 % IJ SOLN: 3.0000 mL | Freq: Two times a day (BID) | INTRAMUSCULAR | Status: DC

## 2014-05-03 MED ORDER — HYDROMORPHONE HCL PF 1 MG/ML IJ SOLN
0.5000 mg | INTRAMUSCULAR | Status: AC | PRN
  Administered 2014-05-03 (×4): 0.5 mg via INTRAVENOUS

## 2014-05-03 MED ORDER — FENTANYL CITRATE 0.05 MG/ML IJ SOLN
INTRAMUSCULAR | Status: DC | PRN
  Administered 2014-05-03 (×3): 25 ug via INTRAVENOUS
  Administered 2014-05-03: 50 ug via INTRAVENOUS
  Administered 2014-05-03: 100 ug via INTRAVENOUS
  Administered 2014-05-03: 25 ug via INTRAVENOUS

## 2014-05-03 MED ORDER — KETOROLAC TROMETHAMINE 30 MG/ML IJ SOLN
INTRAMUSCULAR | Status: AC
  Filled 2014-05-03: qty 1

## 2014-05-03 MED ORDER — EPHEDRINE SULFATE 50 MG/ML IJ SOLN
INTRAMUSCULAR | Status: DC | PRN
  Administered 2014-05-03: 5 mg via INTRAVENOUS

## 2014-05-03 MED ORDER — CEFAZOLIN SODIUM-DEXTROSE 2-3 GM-% IV SOLR
2.0000 g | INTRAVENOUS | Status: AC
  Administered 2014-05-03: 2 g via INTRAVENOUS

## 2014-05-03 MED ORDER — MIDAZOLAM HCL 5 MG/5ML IJ SOLN
INTRAMUSCULAR | Status: DC | PRN
  Administered 2014-05-03: 2 mg via INTRAVENOUS

## 2014-05-03 MED ORDER — ONDANSETRON HCL 4 MG/2ML IJ SOLN
INTRAMUSCULAR | Status: DC | PRN
  Administered 2014-05-03: 4 mg via INTRAVENOUS

## 2014-05-03 MED ORDER — MEPERIDINE HCL 50 MG/ML IJ SOLN: 6.2500 mg | INTRAMUSCULAR | Status: DC | PRN

## 2014-05-03 MED ORDER — OXYCODONE-ACETAMINOPHEN 7.5-325 MG PO TABS: 1.0000 | ORAL_TABLET | ORAL | Status: DC | PRN

## 2014-05-03 MED ORDER — LACTATED RINGERS IV SOLN
INTRAVENOUS | Status: DC
  Administered 2014-05-03: 1000 mL via INTRAVENOUS
  Administered 2014-05-03: 09:00:00 via INTRAVENOUS

## 2014-05-03 MED ORDER — KETOROLAC TROMETHAMINE 30 MG/ML IJ SOLN
30.0000 mg | Freq: Once | INTRAMUSCULAR | Status: AC
  Administered 2014-05-03: 30 mg via INTRAVENOUS

## 2014-05-03 SURGICAL SUPPLY — 40 items
ADH SKN CLS APL DERMABOND .7 (GAUZE/BANDAGES/DRESSINGS) ×2
APL SKNCLS STERI-STRIP NONHPOA (GAUZE/BANDAGES/DRESSINGS)
BENZOIN TINCTURE PRP APPL 2/3 (GAUZE/BANDAGES/DRESSINGS) IMPLANT
BLADE HEX COATED 2.75 (ELECTRODE) ×3 IMPLANT
BLADE SURG 15 STRL LF DISP TIS (BLADE) ×2 IMPLANT
BLADE SURG 15 STRL SS (BLADE) ×3
CANISTER SUCTION 2500CC (MISCELLANEOUS) ×2 IMPLANT
DECANTER SPIKE VIAL GLASS SM (MISCELLANEOUS) ×2 IMPLANT
DERMABOND ADVANCED (GAUZE/BANDAGES/DRESSINGS) ×1
DERMABOND ADVANCED .7 DNX12 (GAUZE/BANDAGES/DRESSINGS) IMPLANT
DISSECTOR ROUND CHERRY 3/8 STR (MISCELLANEOUS) ×1 IMPLANT
DRAIN PENROSE 18X1/2 LTX STRL (DRAIN) ×3 IMPLANT
DRAPE LAPAROTOMY TRNSV 102X78 (DRAPE) ×3 IMPLANT
ELECT REM PT RETURN 9FT ADLT (ELECTROSURGICAL) ×3
ELECTRODE REM PT RTRN 9FT ADLT (ELECTROSURGICAL) ×2 IMPLANT
GAUZE SPONGE 4X4 12PLY STRL (GAUZE/BANDAGES/DRESSINGS) IMPLANT
GLOVE BIOGEL M 8.0 STRL (GLOVE) ×3 IMPLANT
GOWN SPEC L4 XLG W/TWL (GOWN DISPOSABLE) ×2 IMPLANT
GOWN STRL REUS W/TWL XL LVL3 (GOWN DISPOSABLE) ×5 IMPLANT
KIT BASIN OR (CUSTOM PROCEDURE TRAY) ×3 IMPLANT
MESH HERNIA 3X6 (Mesh General) ×1 IMPLANT
NEEDLE HYPO 22GX1.5 SAFETY (NEEDLE) ×3 IMPLANT
PACK BASIC VI WITH GOWN DISP (CUSTOM PROCEDURE TRAY) ×3 IMPLANT
PENCIL BUTTON HOLSTER BLD 10FT (ELECTRODE) ×3 IMPLANT
SPONGE LAP 4X18 X RAY DECT (DISPOSABLE) ×3 IMPLANT
STAPLER VISISTAT 35W (STAPLE) ×1 IMPLANT
STRIP CLOSURE SKIN 1/2X4 (GAUZE/BANDAGES/DRESSINGS) IMPLANT
SUT MON AB 5-0 PS2 18 (SUTURE) ×1 IMPLANT
SUT PROLENE 2 0 CT2 30 (SUTURE) ×5 IMPLANT
SUT PROLENE 2 0 SH DA (SUTURE) ×2 IMPLANT
SUT SILK 2 0 SH (SUTURE) IMPLANT
SUT SILK 2 0 SH CR/8 (SUTURE) IMPLANT
SUT VIC AB 2-0 SH 27 (SUTURE) ×6
SUT VIC AB 2-0 SH 27X BRD (SUTURE) ×2 IMPLANT
SUT VIC AB 4-0 SH 18 (SUTURE) ×3 IMPLANT
SYR 20CC LL (SYRINGE) ×3 IMPLANT
SYR BULB IRRIGATION 50ML (SYRINGE) ×3 IMPLANT
TOWEL OR 17X26 10 PK STRL BLUE (TOWEL DISPOSABLE) ×3 IMPLANT
TOWEL OR NON WOVEN STRL DISP B (DISPOSABLE) ×3 IMPLANT
YANKAUER SUCT BULB TIP 10FT TU (MISCELLANEOUS) ×2 IMPLANT

## 2014-05-03 NOTE — Discharge Instructions (Signed)
General Anesthesia, Care After Refer to this sheet in the next few weeks. These instructions provide you with information on caring for yourself after your procedure. Your health care provider may also give you more specific instructions. Your treatment has been planned according to current medical practices, but problems sometimes occur. Call your health care provider if you have any problems or questions after your procedure. WHAT TO EXPECT AFTER THE PROCEDURE After the procedure, it is typical to experience:  Sleepiness.  Nausea and vomiting. HOME CARE INSTRUCTIONS  For the first 24 hours after general anesthesia:  Have a responsible person with you.  Do not drive a car. If you are alone, do not take public transportation.  Do not drink alcohol.  Do not take medicine that has not been prescribed by your health care provider.  Do not sign important papers or make important decisions.  You may resume a normal diet and activities as directed by your health care provider.  Change bandages (dressings) as directed.  If you have questions or problems that seem related to general anesthesia, call the hospital and ask for the anesthetist or anesthesiologist on call. SEEK MEDICAL CARE IF:  You have nausea and vomiting that continue the day after anesthesia.  You develop a rash. SEEK IMMEDIATE MEDICAL CARE IF:   You have difficulty breathing.  You have chest pain.  You have any allergic problems. Document Released: 11/22/2000 Document Revised: 08/21/2013 Document Reviewed: 03/01/2013 [facility] Patient Information 2015 [place], [place]. This information is not intended to replace advice given to you by your health care provider. Make sure you discuss any questions you have with your health care provider.       Inguinal Hernia, Adult  Care After Refer to this sheet in the next few weeks. These discharge instructions provide you with general information on caring for  yourself after you leave the hospital. Your caregiver may also give you specific instructions. Your treatment has been planned according to the most current medical practices available, but unavoidable complications sometimes occur. If you have any problems or questions after discharge, please call your caregiver. HOME CARE INSTRUCTIONS  Put ice on the operative site.  Put ice in a plastic bag.  Place a towel between your skin and the bag.  Leave the ice on for 15-20 minutes at a time, 03-04 times a day while awake.  Change bandages (dressings) as directed.  Keep the wound dry and clean. The wound may be washed gently with soap and water. Gently blot or dab the wound dry. It is okay to take showers 24 to 48 hours after surgery. Do not take baths, use swimming pools, or use hot tubs for 10 days, or as directed by your caregiver.  Only take over-the-counter or prescription medicines for pain, discomfort, or fever as directed by your caregiver.  Continue your normal diet as directed.  Do not lift anything more than 10 pounds or play contact sports for 3 weeks, or as directed. SEEK MEDICAL CARE IF:  There is redness, swelling, or increasing pain in the wound.  There is fluid (pus) coming from the wound.  There is drainage from a wound lasting longer than 1 day.  You have an oral temperature above 102 F (38.9 C).  You notice a bad smell coming from the wound or dressing.  The wound breaks open after the stitches (sutures) have been removed.  You notice increasing pain in the shoulders (shoulder strap areas).  You develop dizzy  episodes or fainting while standing.  You feel sick to your stomach (nauseous) or throw up (vomit). SEEK IMMEDIATE MEDICAL CARE IF:  You develop a rash.  You have difficulty breathing.  You develop a reaction or have side effects to medicines you were given. MAKE SURE YOU:   Understand these instructions.  Will watch your condition.  Will get  help right away if you are not doing well or get worse. Document Released: 09/16/2006 Document Revised: 11/08/2011 Document Reviewed: 07/16/2009 [facility] Patient Information 2015 [place], [place]. This information is not intended to replace advice given to you by your health care provider. Make sure you discuss any questions you have with your health care provider.

## 2014-05-03 NOTE — Interval H&P Note (Signed)
History and Physical Interval Note:  05/03/2014 10:01 AM  Brad [name]  has presented today for surgery, with the diagnosis of Right Inguinal Hernia  The various methods of treatment have been discussed with the patient and family. After consideration of risks, benefits and other options for treatment, the patient has consented to  Procedure(s): OPEN RIGHT INGUINAL HERNIA REPAIR (Right) as a surgical intervention .  The patient's history has been reviewed, patient examined, no change in status, stable for surgery.  I have reviewed the patient's chart and labs.  Questions were answered to the patient's satisfaction.     [name]

## 2014-05-03 NOTE — Anesthesia Preprocedure Evaluation (Addendum)
Anesthesia Evaluation  Patient identified by MRN, date of birth, ID band Patient awake    Reviewed: Allergy & Precautions, H&P , NPO status , Patient's Chart, lab work & pertinent test results  Airway Mallampati: II TM Distance: >3 FB Neck ROM: Full    Dental no notable dental hx.    Pulmonary neg pulmonary ROS,  breath sounds clear to auscultation  Pulmonary exam normal       Cardiovascular hypertension, Pt. on medications negative cardio ROS  Rhythm:Regular Rate:Normal     Neuro/Psych negative neurological ROS  negative psych ROS   GI/Hepatic negative GI ROS, Neg liver ROS,   Endo/Other  negative endocrine ROS  Renal/GU negative Renal ROS  negative genitourinary   Musculoskeletal negative musculoskeletal ROS (+)   Abdominal   Peds negative pediatric ROS (+)  Hematology negative hematology ROS (+)   Anesthesia Other Findings   Reproductive/Obstetrics negative OB ROS                           Anesthesia Physical Anesthesia Plan  ASA: II  Anesthesia Plan: General   Post-op Pain Management:    Induction: Intravenous  Airway Management Planned: LMA  Additional Equipment:   Intra-op Plan:   Post-operative Plan: Extubation in OR  Informed Consent: I have reviewed the patients History and Physical, chart, labs and discussed the procedure including the risks, benefits and alternatives for the proposed anesthesia with the patient or authorized representative who has indicated his/her understanding and acceptance.   Dental advisory given  Plan Discussed with: CRNA  Anesthesia Plan Comments:         Anesthesia Quick Evaluation  

## 2014-05-03 NOTE — Anesthesia Postprocedure Evaluation (Signed)
  Anesthesia Post-op Note  Patient: Brad [name]  Procedure(s) Performed: Procedure(s) (LRB): OPEN RIGHT INGUINAL HERNIA REPAIR WITH MESH (Right) INSERTION OF MESH (Right)  Patient Location: PACU  Anesthesia Type: General  Level of Consciousness: awake and alert   Airway and Oxygen Therapy: Patient Spontanous Breathing  Post-op Pain: mild  Post-op Assessment: Post-op Vital signs reviewed, Patient's Cardiovascular Status Stable, Respiratory Function Stable, Patent Airway and No signs of Nausea or vomiting  Last Vitals:  Filed Vitals:   05/03/14 1647  BP: 142/84  Pulse: 62  Temp: 36.8 C  Resp: 18    Post-op Vital Signs: stable   Complications: No apparent anesthesia complications\

## 2014-05-03 NOTE — H&P (View-Only) (Signed)
Chief Complaint:  New right inguinal hernia  History of Present Illness:  Brad [name] is an 58 y.o. male know to me from prior [facility] done in 2012.  He needs an open RIH.  He understands the procedure and risk and the need for mesh. We will go and get this scheduled at his convenience. A booklet on hernia repair was given to him  Past Medical History  Diagnosis Date  . GERD (gastroesophageal reflux disease)   . Hearing loss     bilateral   . Anxiety   . Hypertension   . Perianal pain   . Allergy   . Tinnitus   . Personal history of colonic polyps   . MVP (mitral valve prolapse)   . DJD (degenerative joint disease), cervical   . Inguinal hernia     left  . Insomnia   . Depression     Past Surgical History  Procedure Laterality Date  . Carpal tunnel release  10/06, 5/10    right wrist   . Cervical spine discectomy   2/07  . Carpal tunnel release  3/10    left wrist   . Tonsillectomy  1960  . Inguinal hernia repair  08/16/2011    Procedure: HERNIA REPAIR INGUINAL ADULT;  Surgeon: Valarie Merino, MD;  Location: [facility];  Service: General;  Laterality: Left;    Current Outpatient Prescriptions  Medication Sig Dispense Refill  . acyclovir (ZOVIRAX) 800 MG tablet Take 800 mg by mouth 2 (two) times daily.       . Alpha-Lipoic Acid 100 MG CAPS Take 1 capsule by mouth every morning.      . busPIRone (BUSPAR) 10 MG tablet Take 20 mg by mouth 3 (three) times daily.       . Cholecalciferol (VITAMIN D-3) 5000 UNITS TABS Take 1 tablet by mouth daily.      Marland Kitchen co-enzyme Q-10 50 MG capsule Take 50 mg by mouth every morning.      Marland Kitchen DHEA 25 MG CAPS Take 1 capsule by mouth every morning.      Marland Kitchen doxepin (SINEQUAN) 10 MG capsule Take 20 mg by mouth at bedtime. Takes 2      . fluticasone (FLONASE) 50 MCG/ACT nasal spray Place 2 sprays into the nose daily.  16 g  2  . ibuprofen (ADVIL,MOTRIN) 800 MG tablet Take 1 tablet (800 mg total) by mouth 3 (three) times daily.  21 tablet  0   . LORazepam (ATIVAN) 0.5 MG tablet Take 1 mg by mouth 2 (two) times daily.      . Omega-3 Fatty Acids (FISH OIL) 1200 MG CAPS Take 2 capsules by mouth every morning.      Marland Kitchen omeprazole (PRILOSEC) 20 MG capsule Take 20 mg by mouth daily as needed (indegestion.).       Marland Kitchen propranolol (INDERAL) 10 MG tablet Take 10 mg by mouth daily as needed (heart rate.).      Marland Kitchen sildenafil (VIAGRA) 100 MG tablet Take 50 mg by mouth as needed.       . Suvorexant (BELSOMRA) 20 MG TABS Take 1 tablet by mouth at bedtime.      . valsartan-hydrochlorothiazide (DIOVAN-HCT) 160-12.5 MG per tablet Take 0.5 tablets by mouth daily.       . zaleplon (SONATA) 10 MG capsule Take 10 mg by mouth at bedtime as needed for sleep.       Marland Kitchen Zinc 50 MG CAPS Take 1 capsule by mouth every morning.      Marland Kitchen  zolpidem (AMBIEN CR) 12.5 MG CR tablet Take 12.5 mg by mouth at bedtime as needed.         No current facility-administered medications for this visit.   Review of patient's allergies indicates no known allergies. Family History  Problem Relation Age of Onset  . Cancer Mother     breast   Social History:   reports that he has never smoked. He has never used smokeless tobacco. He reports that he drinks about 1.2 ounces of alcohol per week. He reports that he uses illicit drugs (Marijuana).   REVIEW OF SYSTEMS : Negative except for pain associated with his inguinal hernia.  Physical Exam:   Blood pressure 122/70, pulse 76, temperature 98 F (36.7 C), height  (1.778 m), weight 159 lb (72.122 kg). Body mass index is 22.81 kg/(m^2).  Gen:  WDWN white male NAD  Neurological: Alert and oriented to person, place, and time. Motor and sensory function is grossly intact  Head: Normocephalic and atraumatic.  Eyes: Conjunctivae are normal. Pupils are equal, round, and reactive to light. No scleral icterus.  Neck: Normal range of motion. Neck supple. No tracheal deviation or thyromegaly present.  Cardiovascular:  SR without murmurs  or gallops.  No carotid bruits Breast:  Not examined Respiratory: Effort normal.  No respiratory distress. No chest wall tenderness. Breath sounds normal.  No wheezes, rales or rhonchi.  Abdomen:  Not tender GU:  Bulge in right inguinal canal consistent with running hernia. Left inguinal hernia repair intact. Musculoskeletal: Normal range of motion. Extremities are nontender. No cyanosis, edema or clubbing noted Lymphadenopathy: No cervical, preauricular, postauricular or axillary adenopathy is present Skin: Skin is warm and dry. No rash noted. No diaphoresis. No erythema. No pallor. Pscyh: Normal mood and affect. Behavior is normal. Judgment and thought content normal.   LABORATORY RESULTS: No results found for this or any previous visit (from the past 48 hour(s)).   RADIOLOGY RESULTS: No results found.  Problem List: Patient Active Problem List   Diagnosis Date Noted  . Inguinal hernia, left-repair [facility] Dec 2012 09/10/2011    Assessment & Plan: New right inguinal hernia that is symptomatic. Will give him something for pain and schedule him for open right inguinal hernia under general.    [name], MD, [facility] [phone] beeper [phone]  04/12/2014 4:53 PM

## 2014-05-03 NOTE — Op Note (Signed)
Surgeon: Wenda Low, MD, FACS  Asst:  none  Anes:  General   Procedure: Open right inguinal hernia repair with marlex type mesh  Diagnosis: Indirect right inguinal hernia with floor weakness  Complications: none  EBL:   minimal cc  Drains: none  Description of Procedure:  The patient was taken to OR 1 at [facility].  After anesthesia was administered and the patient was prepped a timeout was performed.  A right oblique incision was made on the right and the external oblique fascia was identified, incised and opened.  The cord was mobilized.  Anteromedially there was an indirect sac which was dissected from the cord, opened and high ligation performed with vicryl.  The floor appeared somewhat weak and the entire area was reinforced with a piece of marlex mesh, cut to fit and secured with running 2-0 prolene.  Two limbs were brought around the cord and secured with a single prolene suture.  The area was infiltrated with Exparel and the external oblique was closed with a running 2-0 vicryl.  4-0 vicryl and 5-0 moncryl were used to close the incision and then dermabond was applied on the skin.    The patient tolerated the procedure well and was taken to the PACU in stable condition.     [name], MD, [facility], [place] [phone]

## 2014-05-03 NOTE — Progress Notes (Addendum)
Patient is very unhappy with prescription of hydrocodone for post op pain at home. Desires RN to call Dr Daphine Deutscher for oxycodone.  1615 Dr Daphine Deutscher almost finished with surgery. He will write new prescription for patient when he is finished with surgery.   Received prescription for oxycodone with tylenol  (5/325 mg) given to wife. Destroyed prescription for hydrocodone.

## 2014-05-03 NOTE — Transfer of Care (Signed)
Immediate Anesthesia Transfer of Care Note  Patient: Brad [name]  Procedure(s) Performed: Procedure(s): OPEN RIGHT INGUINAL HERNIA REPAIR WITH MESH (Right) INSERTION OF MESH (Right)  Patient Location: PACU  Anesthesia Type:General  Level of Consciousness: awake, alert  and oriented  Airway & Oxygen Therapy: Patient Spontanous Breathing and Patient connected to face mask oxygen  Post-op Assessment: Report given to PACU RN and Post -op Vital signs reviewed and stable  Post vital signs: Reviewed and stable  Complications: No apparent anesthesia complications

## 2014-05-07 ENCOUNTER — Encounter (HOSPITAL_COMMUNITY): Payer: Self-pay | Admitting: Surgery

## 2014-05-07 NOTE — Progress Notes (Signed)
Patient complains of constipation after inguinal hernia surgery post op day #4. He has senokot in house and is instructed to follow package instructions. To drink 8 ounce of liquids every hour and try to ambulate. To stay off any foods that have constipated him in the past. Try to switch to advil during the day and save narcotic for use at night. To call MD office if constipation is not resolved.

## 2014-08-16 ENCOUNTER — Ambulatory Visit (INDEPENDENT_AMBULATORY_CARE_PROVIDER_SITE_OTHER): Payer: BC Managed Care – PPO | Admitting: Internal Medicine

## 2014-08-16 DIAGNOSIS — Z23 Encounter for immunization: Secondary | ICD-10-CM

## 2014-08-16 DIAGNOSIS — Z9189 Other specified personal risk factors, not elsewhere classified: Secondary | ICD-10-CM

## 2014-08-16 MED ORDER — ATOVAQUONE-PROGUANIL HCL 250-100 MG PO TABS: 1.0000 | ORAL_TABLET | Freq: Every day | ORAL | Status: DC

## 2014-08-16 MED ORDER — PROMETHAZINE HCL 25 MG PO TABS: 25.0000 mg | ORAL_TABLET | Freq: Four times a day (QID) | ORAL | Status: DC | PRN

## 2014-08-16 MED ORDER — AZITHROMYCIN 500 MG PO TABS: 500.0000 mg | ORAL_TABLET | Freq: Every day | ORAL | Status: AC

## 2014-08-16 MED ORDER — TYPHOID VACCINE PO CPDR: 1.0000 | DELAYED_RELEASE_CAPSULE | ORAL | Status: DC

## 2014-08-16 NOTE — Progress Notes (Signed)
Patient ID: Brad [name], male   DOB: Oct 07, 1955, 58 y.o.   MRN: [id number]  HPI Brad RuizJohn is a 58yo M who is a going to [place] and [place] with his son from jan 4th through the 22nd  He has extensive travel history, but also had malaria in [place] in 1984. E.histolytica in 1981  No Known Allergies   Outpatient Encounter Prescriptions as of 08/16/2014  Medication Sig  . Alpha-Lipoic Acid 100 MG CAPS Take 1 capsule by mouth every morning.  Marland Kitchen. ALPRAZolam (XANAX) 0.5 MG tablet Take 0.5 mg by mouth at bedtime as needed for anxiety.  Marland Kitchen. atovaquone-proguanil (MALARONE) 250-100 MG TABS Take 1 tablet by mouth daily. Start 2 days prior to travel. Take daily until complete  . azithromycin (ZITHROMAX) 500 MG tablet Take 1 tablet (500 mg total) by mouth daily. Take 1 tablet daily if needed for 3 + loose stools/day. Can stop taking if diarrhea resolves  . busPIRone (BUSPAR) 10 MG tablet Take 20 mg by mouth 3 (three) times daily.   . Cholecalciferol (VITAMIN D-3) 5000 UNITS TABS Take 1 tablet by mouth daily.  Marland Kitchen. co-enzyme Q-10 50 MG capsule Take 50 mg by mouth every morning.  Marland Kitchen. DHEA 25 MG CAPS Take 1 capsule by mouth every morning.  Marland Kitchen. doxepin (SINEQUAN) 10 MG capsule Take 20 mg by mouth at bedtime. Takes 2  . HYDROcodone-acetaminophen (NORCO) 5-325 MG per tablet Take 1 tablet by mouth every 4 (four) hours as needed for moderate pain.  Marland Kitchen. HYDROcodone-acetaminophen (NORCO) 5-325 MG per tablet Take 1 tablet by mouth every 4 (four) hours as needed for moderate pain.  Marland Kitchen. ibuprofen (ADVIL,MOTRIN) 800 MG tablet Take 800 mg by mouth every 8 (eight) hours as needed.  Marland Kitchen. LORazepam (ATIVAN) 0.5 MG tablet Take 1 mg by mouth 2 (two) times daily.  . Multiple Vitamin (MULTIVITAMIN WITH MINERALS) TABS tablet Take 1 tablet by mouth daily.  . Omega 3 1000 MG CAPS Take 2,000 mg by mouth daily.  Marland Kitchen. oxyCODONE-acetaminophen (PERCOCET) 7.5-325 MG per tablet Take 1 tablet by mouth every 4 (four) hours as needed for pain.  .  promethazine (PHENERGAN) 25 MG tablet Take 1 tablet (25 mg total) by mouth every 6 (six) hours as needed for nausea or vomiting.  . propranolol (INDERAL) 10 MG tablet Take 10 mg by mouth daily as needed (heart rate.).  Marland Kitchen. sildenafil (VIAGRA) 100 MG tablet Take 50 mg by mouth daily as needed for erectile dysfunction.   . Suvorexant (BELSOMRA) 20 MG TABS Take 1 tablet by mouth at bedtime.  . typhoid (VIVOTIF BERNA VACCINE) DR capsule Take 1 capsule by mouth every other day.  . valsartan-hydrochlorothiazide (DIOVAN-HCT) 160-25 MG per tablet Take 0.5 tablets by mouth every morning.  . zaleplon (SONATA) 10 MG capsule Take 10 mg by mouth at bedtime as needed for sleep.   Marland Kitchen. Zinc 50 MG CAPS Take 1 capsule by mouth every morning.  . zolpidem (AMBIEN CR) 12.5 MG CR tablet Take 12.5 mg by mouth at bedtime.      Patient Active Problem List   Diagnosis Date Noted  . Right inguinal hernia 04/12/2014  . [facility]10[facility]     Health Maintenance Due  Topic Date Due  . TETANUS/TDAP  07/20/1974  . COLONOSCOPY  07/20/2005  . INFLUENZA VACCINE  03/30/2014      CBC Lab Results  Component Value Date   WBC 5.9 04/30/2014   RBC 4.61 04/30/2014   HGB 14.6  04/30/2014   HCT 41.1 04/30/2014   PLT 228 04/30/2014   MCV 89.2 04/30/2014   MCH 31.7 04/30/2014   MCHC 35.5 04/30/2014   RDW 13.4 04/30/2014   LYMPHSABS 1.7 02/27/2008   MONOABS 0.9 02/27/2008   EOSABS 0.1 02/27/2008   BASOSABS 0.0 02/27/2008   BMET Lab Results  Component Value Date   NA 136* 04/30/2014   K 4.7 04/30/2014   CL 96 04/30/2014   CO2 28 04/30/2014   GLUCOSE 87 04/30/2014   BUN 11 04/30/2014   CREATININE 0.92 04/30/2014   CALCIUM 10.1 04/30/2014   GFRNONAA >90 04/30/2014   GFRAA >90 04/30/2014     Assessment and Plan  Pre travel counseling = discussed ways to minimize risk of getting travelers diarrhea, mosquito borne illnesses and general safety.  Pre travel vaccination =  recommend to get hep A, yellow fever and oral typhoid   Malaria proph = gave rx for malarone plus recommended DEET and premethrin for skin  Traveler's diarrhea =gave rx for cipro to use as needed

## 2014-10-16 ENCOUNTER — Telehealth: Payer: Self-pay | Admitting: *Deleted

## 2014-10-16 NOTE — Telephone Encounter (Signed)
Let him know that deet should be 25-30% concentration and premethrin 0.5% for his clothing will provide ample protection against zika, dengue, chik (aka Nigerchikungunya)

## 2014-10-16 NOTE — Telephone Encounter (Signed)
Patient requested call from nurse re: return trip to [place].  He recently saw Dr. Drue SecondSnider in preparation for travel to [place] and [place], home only a few weeks.  He is returning to [place] in 2 weeks, wanted to confirm that the state of [place] is not in a malaria area.  He wanted to confirm that the prophylaxis protecting him from mosquito borne illness (dengue, chickungunya) would also apply for zika.  RN confirmed.  He was cautioned to use condoms while in [place] so as to reduce the possible risk of transmission of zika, among other illnesses.  Patient verbalized understanding.  He still has the cipro prescribed against traveler's diarrhea, does not need a refill. Patient's questions answered to his satisfaction. He will contact [facility] for his future travel needs. Andree CossHowell, Valentine Barney M, RN

## 2014-12-20 ENCOUNTER — Other Ambulatory Visit: Payer: Self-pay | Admitting: Gastroenterology

## 2015-02-18 ENCOUNTER — Other Ambulatory Visit: Payer: Self-pay | Admitting: Gastroenterology

## 2015-03-04 ENCOUNTER — Other Ambulatory Visit: Payer: Self-pay | Admitting: Internal Medicine

## 2015-03-04 DIAGNOSIS — R197 Diarrhea, unspecified: Secondary | ICD-10-CM

## 2015-03-04 DIAGNOSIS — Z9189 Other specified personal risk factors, not elsewhere classified: Secondary | ICD-10-CM

## 2015-03-05 ENCOUNTER — Other Ambulatory Visit: Payer: BLUE CROSS/BLUE SHIELD

## 2015-03-05 ENCOUNTER — Ambulatory Visit (INDEPENDENT_AMBULATORY_CARE_PROVIDER_SITE_OTHER): Payer: BLUE CROSS/BLUE SHIELD | Admitting: *Deleted

## 2015-03-05 DIAGNOSIS — R197 Diarrhea, unspecified: Secondary | ICD-10-CM

## 2015-03-05 DIAGNOSIS — Z789 Other specified health status: Secondary | ICD-10-CM | POA: Diagnosis not present

## 2015-03-05 DIAGNOSIS — Z9189 Other specified personal risk factors, not elsewhere classified: Secondary | ICD-10-CM

## 2015-03-05 DIAGNOSIS — Z23 Encounter for immunization: Secondary | ICD-10-CM | POA: Diagnosis not present

## 2015-03-05 LAB — CBC WITH DIFFERENTIAL/PLATELET
BASOS ABS: 0.1 10*3/uL (ref 0.0–0.1)
Basophils Relative: 1 % (ref 0–1)
EOS ABS: 0.1 10*3/uL (ref 0.0–0.7)
EOS PCT: 1 % (ref 0–5)
HEMATOCRIT: 42.1 % (ref 39.0–52.0)
HEMOGLOBIN: 14.3 g/dL (ref 13.0–17.0)
Lymphocytes Relative: 24 % (ref 12–46)
Lymphs Abs: 1.8 10*3/uL (ref 0.7–4.0)
MCH: 31 pg (ref 26.0–34.0)
MCHC: 34 g/dL (ref 30.0–36.0)
MCV: 91.1 fL (ref 78.0–100.0)
MPV: 8.8 fL (ref 8.6–12.4)
Monocytes Absolute: 0.5 10*3/uL (ref 0.1–1.0)
Monocytes Relative: 7 % (ref 3–12)
Neutro Abs: 5 10*3/uL (ref 1.7–7.7)
Neutrophils Relative %: 67 % (ref 43–77)
PLATELETS: 231 10*3/uL (ref 150–400)
RBC: 4.62 MIL/uL (ref 4.22–5.81)
RDW: 13.8 % (ref 11.5–15.5)
WBC: 7.4 10*3/uL (ref 4.0–10.5)

## 2015-03-05 LAB — HIV ANTIBODY (ROUTINE TESTING W REFLEX): HIV 1&2 Ab, 4th Generation: NONREACTIVE

## 2015-03-06 NOTE — Addendum Note (Signed)
Addended by: Mariea ClontsGREEN, Hamilton Marinello D on: 03/06/2015 12:33 PM   Modules accepted: Orders

## 2015-03-07 LAB — OVA AND PARASITE EXAMINATION: OP: NONE SEEN

## 2015-03-07 LAB — GIARDIA/CRYPTOSPORIDIUM (EIA)
Cryptosporidium Screen (EIA): NEGATIVE
Giardia Screen (EIA): NEGATIVE

## 2015-03-10 ENCOUNTER — Telehealth: Payer: Self-pay | Admitting: *Deleted

## 2015-03-10 NOTE — Telephone Encounter (Signed)
Patient dropped stool last week and the lab called to advise that they were unable to perform test on the sample as it was formed. The patient has reported loose stools but the issue has obviously resolved. Will let the doctor know.

## 2015-03-12 NOTE — Telephone Encounter (Signed)
No need to do further testing.

## 2015-03-17 ENCOUNTER — Ambulatory Visit (INDEPENDENT_AMBULATORY_CARE_PROVIDER_SITE_OTHER): Payer: BLUE CROSS/BLUE SHIELD | Admitting: Internal Medicine

## 2015-03-17 VITALS — BP 163/97 | HR 98 | Temp 98.6°F | Resp 16 | Ht 70.0 in | Wt 152.0 lb

## 2015-03-17 DIAGNOSIS — Z9189 Other specified personal risk factors, not elsewhere classified: Secondary | ICD-10-CM

## 2015-03-17 NOTE — Progress Notes (Signed)
Subjective:    Patient ID: Brad [name], male    DOB: [birth year], 59 y.o.   MRN: [id number]  HPI  59yo M in good state of health recently returned < 10 days, from a 10 week trip to [place]. During this time, he stayed with his girlfriend who lives in [facility], modest accomodations, open windows with no screens. He drank bottled water but bathed and brushed teeth with tap water. He states that the area had open sewage he had 2 bouts of diarrhea separate episodes while he was there. First episode lasted 2 days and 2nd episode started roughly 10 days later. He took antibiotics and it improved. He returned back to [place] in good health except he was concerned that he may have contracted parasitic infection. He has had several mosquito bites, used deet ranging from 10-30% concentration. He did not used any mosquito nets.  Aside from having diarrheal infection, he denies fevers, arthralgia, rash, conjunctivitis.  Over the last 8 months, in a period of 2 extended trips to [place], he has found a love interest that he is considering long distance relationship, possibly marriage. Unprotected sex in the last 3 months  He anticipate that he may return back to [place] in the Fall.  Lab review: Upon return to the [place], he was tested for giardia, cryptosporidium, c.difficile, and hiv. All of which was negative  No Known Allergies Current Outpatient Prescriptions on File Prior to Visit  Medication Sig Dispense Refill  . ALPRAZolam (XANAX) 0.5 MG tablet Take 0.5 mg by mouth at bedtime as needed for anxiety.    Marland Kitchen atovaquone-proguanil (MALARONE) 250-100 MG TABS Take 1 tablet by mouth daily. Start 2 days prior to travel. Take daily until complete 12 tablet 0  . busPIRone (BUSPAR) 10 MG tablet Take 20 mg by mouth 3 (three) times daily.     . Cholecalciferol (VITAMIN D-3) 5000 UNITS TABS Take 1 tablet by mouth daily.    Marland Kitchen co-enzyme Q-10 50 MG capsule Take 50 mg by mouth every morning.    Marland Kitchen DHEA 25 MG CAPS  Take 1 capsule by mouth every morning.    Marland Kitchen ibuprofen (ADVIL,MOTRIN) 200 MG tablet Take 400-800 mg by mouth every 6 (six) hours as needed for headache or moderate pain.    Marland Kitchen LORazepam (ATIVAN) 0.5 MG tablet Take 1 mg by mouth 2 (two) times daily.    . Multiple Vitamin (MULTIVITAMIN WITH MINERALS) TABS tablet Take 1 tablet by mouth daily.    . Omega 3 1000 MG CAPS Take 1,200 mg by mouth daily.     Marland Kitchen OVER THE COUNTER MEDICATION Place 1-2 drops into both eyes 2 (two) times daily. MIneral Oil Eye Drops    . sildenafil (VIAGRA) 100 MG tablet Take 50 mg by mouth daily as needed for erectile dysfunction.     . Suvorexant (BELSOMRA) 20 MG TABS Take 1 tablet by mouth at bedtime as needed (sleep).     . valsartan-hydrochlorothiazide (DIOVAN-HCT) 160-25 MG per tablet Take 0.5 tablets by mouth every morning. MWF a whole tablet and half a tablet on all other days.    . zaleplon (SONATA) 10 MG capsule Take 10 mg by mouth at bedtime as needed for sleep.     Marland Kitchen Zinc 50 MG CAPS Take 1 capsule by mouth every morning.    . zolpidem (AMBIEN CR) 12.5 MG CR tablet Take 12.5 mg by mouth at bedtime.     Marland Kitchen HYDROcodone-acetaminophen (NORCO) 5-325 MG per tablet Take 1  tablet by mouth every 4 (four) hours as needed for moderate pain. (Patient not taking: Reported on 02/03/2015) 30 tablet 0  . HYDROcodone-acetaminophen (NORCO) 5-325 MG per tablet Take 1 tablet by mouth every 4 (four) hours as needed for moderate pain. (Patient not taking: Reported on 02/03/2015) 30 tablet 0  . oxyCODONE-acetaminophen (PERCOCET) 7.5-325 MG per tablet Take 1 tablet by mouth every 4 (four) hours as needed for pain. (Patient not taking: Reported on 02/03/2015) 30 tablet 0  . promethazine (PHENERGAN) 25 MG tablet Take 1 tablet (25 mg total) by mouth every 6 (six) hours as needed for nausea or vomiting. (Patient not taking: Reported on 02/03/2015) 20 tablet 0  . typhoid (VIVOTIF BERNA VACCINE) DR capsule Take 1 capsule by mouth every other day. (Patient not  taking: Reported on 02/03/2015) 4 capsule 0   No current facility-administered medications on file prior to visit.   Active Ambulatory Problems    Diagnosis Date Noted  . Inguinal hernia, [facility] 09/10/2011  . Right inguinal hernia 04/12/2014   Resolved Ambulatory Problems    Diagnosis Date Noted  . No Resolved Ambulatory Problems   Past Medical History  Diagnosis Date  . GERD (gastroesophageal reflux disease)   . Hearing loss   . Anxiety   . Perianal pain   . Allergy   . Tinnitus   . Personal history of colonic polyps   . MVP (mitral valve prolapse)   . DJD (degenerative joint disease), cervical   . Inguinal hernia   . Insomnia   . Depression   . Hypertension   . H/O bronchitis   . H/O malaria 1984  . Dysentery, amebic, acute 1981     Review of Systems  Constitutional: Negative for fever, chills, diaphoresis, activity change, appetite change, fatigue and unexpected weight change.  HENT: Negative for congestion, sore throat, rhinorrhea, sneezing, trouble swallowing and sinus pressure.  Eyes: Negative for photophobia and visual disturbance.  Respiratory: Negative for cough, chest tightness, shortness of breath, wheezing and stridor.  Cardiovascular: Negative for chest pain, palpitations and leg swelling.  Gastrointestinal: Negative for nausea, vomiting, abdominal pain, diarrhea, constipation, blood in stool, abdominal distention and anal bleeding.  Genitourinary: Negative for dysuria, hematuria, flank pain and difficulty urinating.  Musculoskeletal: Negative for myalgias, back pain, joint swelling, arthralgias and gait problem.  Skin: Negative for color change, pallor, rash and wound.  Neurological: Negative for dizziness, tremors, weakness and light-headedness.  Hematological: Negative for adenopathy. Does not bruise/bleed easily.  Psychiatric/Behavioral: Negative for behavioral problems, confusion, sleep disturbance, dysphoric mood, decreased  concentration and agitation.       Objective:   Physical Exam BP 163/97 mmHg  Pulse 98  Temp(Src) 98.6 F (37 C) (Oral)  Resp 16  Ht 5\' 10"  (1.778 m)  Wt 152 lb (68.947 kg)  BMI 21.81 kg/m2 gen = a xo by3 in NAD Psych = appropriate affect      Assessment & Plan:  Dx: at risk for infectious diseases due to recent foreign travel. - diarrhea resolved - no need for testing for zika since does not appear to have clinical symptoms c/w zika virus infection. His girlfriend in [place] is asymptomatic,also 59 yo. Further work up = recommend that he repeats hiv testing in 6-8 wk. If he has recurrent diarrhea over the next few weeks, would repeat diarrheal work up.  Recommend to contact [facility] again for pretravel counseling prior to next trip to [place].

## 2015-04-29 ENCOUNTER — Other Ambulatory Visit: Payer: BLUE CROSS/BLUE SHIELD

## 2015-06-04 ENCOUNTER — Other Ambulatory Visit: Payer: BLUE CROSS/BLUE SHIELD

## 2015-06-04 DIAGNOSIS — Z202 Contact with and (suspected) exposure to infections with a predominantly sexual mode of transmission: Secondary | ICD-10-CM

## 2015-06-05 LAB — HIV ANTIBODY (ROUTINE TESTING W REFLEX): HIV 1&2 Ab, 4th Generation: NONREACTIVE

## 2015-06-24 ENCOUNTER — Other Ambulatory Visit: Payer: Self-pay | Admitting: Gastroenterology

## 2015-06-30 ENCOUNTER — Encounter (HOSPITAL_COMMUNITY): Payer: Self-pay | Admitting: *Deleted

## 2015-10-08 ENCOUNTER — Encounter (HOSPITAL_COMMUNITY): Payer: Self-pay | Admitting: *Deleted

## 2015-10-21 ENCOUNTER — Ambulatory Visit (HOSPITAL_COMMUNITY)
Admission: RE | Admit: 2015-10-21 | Discharge: 2015-10-21 | Disposition: A | Discharge status: Home / Self Care | Payer: BLUE CROSS/BLUE SHIELD | Source: Ambulatory Visit | Attending: Gastroenterology | Admitting: Gastroenterology

## 2015-10-21 ENCOUNTER — Ambulatory Visit (HOSPITAL_COMMUNITY): Payer: BLUE CROSS/BLUE SHIELD | Admitting: Certified Registered"

## 2015-10-21 ENCOUNTER — Encounter (HOSPITAL_COMMUNITY): Payer: Self-pay | Admitting: Certified Registered"

## 2015-10-21 ENCOUNTER — Encounter (HOSPITAL_COMMUNITY)
Admission: RE | Disposition: A | Discharge status: Home / Self Care | Payer: Self-pay | Source: Ambulatory Visit | Attending: Gastroenterology

## 2015-10-21 DIAGNOSIS — Z86010 Personal history of colonic polyps: Secondary | ICD-10-CM | POA: Insufficient documentation

## 2015-10-21 DIAGNOSIS — Z1211 Encounter for screening for malignant neoplasm of colon: Secondary | ICD-10-CM | POA: Insufficient documentation

## 2015-10-21 DIAGNOSIS — M1990 Unspecified osteoarthritis, unspecified site: Secondary | ICD-10-CM | POA: Diagnosis not present

## 2015-10-21 DIAGNOSIS — I10 Essential (primary) hypertension: Secondary | ICD-10-CM | POA: Diagnosis not present

## 2015-10-21 DIAGNOSIS — I341 Nonrheumatic mitral (valve) prolapse: Secondary | ICD-10-CM | POA: Diagnosis not present

## 2015-10-21 DIAGNOSIS — K219 Gastro-esophageal reflux disease without esophagitis: Secondary | ICD-10-CM | POA: Insufficient documentation

## 2015-10-21 HISTORY — PX: COLONOSCOPY WITH PROPOFOL: SHX5780

## 2015-10-21 SURGERY — COLONOSCOPY WITH PROPOFOL
Anesthesia: Monitor Anesthesia Care

## 2015-10-21 MED ORDER — LIDOCAINE HCL (CARDIAC) 20 MG/ML IV SOLN
INTRAVENOUS | Status: DC | PRN
  Administered 2015-10-21: 50 mg via INTRAVENOUS

## 2015-10-21 MED ORDER — SODIUM CHLORIDE 0.9 % IV SOLN: INTRAVENOUS | Status: DC

## 2015-10-21 MED ORDER — PROPOFOL 10 MG/ML IV BOLUS
INTRAVENOUS | Status: AC
  Filled 2015-10-21: qty 40

## 2015-10-21 MED ORDER — LACTATED RINGERS IV SOLN
INTRAVENOUS | Status: DC
  Administered 2015-10-21: 08:00:00 via INTRAVENOUS

## 2015-10-21 MED ORDER — LIDOCAINE HCL (CARDIAC) 20 MG/ML IV SOLN
INTRAVENOUS | Status: AC
  Filled 2015-10-21: qty 5

## 2015-10-21 MED ORDER — PROPOFOL 500 MG/50ML IV EMUL
INTRAVENOUS | Status: DC | PRN
  Administered 2015-10-21: 150 ug/kg/min via INTRAVENOUS

## 2015-10-21 MED ORDER — PROPOFOL 10 MG/ML IV BOLUS
INTRAVENOUS | Status: DC | PRN
  Administered 2015-10-21 (×2): 50 mg via INTRAVENOUS

## 2015-10-21 SURGICAL SUPPLY — 22 items

## 2015-10-21 NOTE — Discharge Instructions (Signed)
Colonoscopy, Care After °Refer to this sheet in the next few weeks. These instructions provide you with information on caring for yourself after your procedure. Your health care provider may also give you more specific instructions. Your treatment has been planned according to current medical practices, but problems sometimes occur. Call your health care provider if you have any problems or questions after your procedure. °WHAT TO EXPECT AFTER THE PROCEDURE  °After your procedure, it is typical to have the following: °· A small amount of blood in your stool. °· Moderate amounts of gas and mild abdominal cramping or bloating. °HOME CARE INSTRUCTIONS °· Do not drive, operate machinery, or sign important documents for 24 hours. °· You may shower and resume your regular physical activities, but move at a slower pace for the first 24 hours. °· Take frequent rest periods for the first 24 hours. °· Walk around or put a warm pack on your abdomen to help reduce abdominal cramping and bloating. °· Drink enough fluids to keep your urine clear or pale yellow. °· You may resume your normal diet as instructed by your health care provider. Avoid heavy or fried foods that are hard to digest. °· Avoid drinking alcohol for 24 hours or as instructed by your health care provider. °· Only take over-the-counter or prescription medicines as directed by your health care provider. °· If a tissue sample (biopsy) was taken during your procedure: °¨ Do not take aspirin or blood thinners for 7 days, or as instructed by your health care provider. °¨ Do not drink alcohol for 7 days, or as instructed by your health care provider. °¨ Eat soft foods for the first 24 hours. °SEEK MEDICAL CARE IF: °You have persistent spotting of blood in your stool 2-3 days after the procedure. °SEEK IMMEDIATE MEDICAL CARE IF: °· You have more than a small spotting of blood in your stool. °· You pass large blood clots in your stool. °· Your abdomen is swollen  (distended). °· You have nausea or vomiting. °· You have a fever. °· You have increasing abdominal pain that is not relieved with medicine. °  °This information is not intended to replace advice given to you by your health care provider. Make sure you discuss any questions you have with your health care provider. °  °Document Released: 03/30/2004 Document Revised: 06/06/2013 Document Reviewed: 04/23/2013 °Elsevier Interactive Patient Education ©2016 [employer] ° °

## 2015-10-21 NOTE — Op Note (Signed)
Procedure: Surveillance colonoscopy. 04/25/2006 colonoscopy was performed with removal of a sessile serrated adenomatous descending colon polyp. 04/21/2009 normal surveillance colonoscopy was performed. 08/29/2013 normal esophagogastroduodenoscopy was performed.  Endoscopist: Danise Edge  Premedication: Propofol administered by anesthesia  Procedure: The patient was placed in the left lateral decubitus position. Anal inspection and digital rectal exam were normal. The Pentax pediatric colonoscope was introduced into the rectum and advanced to the cecum. A normal-appearing ileocecal valve and appendiceal orifice were identified. Colonic preparation for the exam today was good. Withdrawal time was 12 minutes  Rectum. Normal. Retroflexed view of the distal rectum was normal  Sigmoid colon and descending colon. Normal  Splenic flexure. Normal  Transverse colon. Normal  Hepatic flexure. Normal  Ascending colon. Normal  Cecum and ileocecal valve. Normal  Assessment: Normal surveillance colonoscopy  Recommendation: Schedule surveillance colonoscopy in approximately 5 years.

## 2015-10-21 NOTE — H&P (Signed)
  Procedure: Surveillance colonoscopy. 04/25/2006 colonoscopy was performed with removal of a sessile serrated adenomatous descending colon polyp. 04/21/2009 surveillance colonoscopy was normal. 08/29/2013 normal esophagogastroduodenoscopy was performed  History: The patient is a 60 year old male born in 1956-03-27. He is scheduled to undergo a surveillance colonoscopy today.  Past medical history: Chronic insomnia. Anxiety with depression. Hypertension. Allergic rhinitis. Mitral valve prolapse syndrome. Cervical degenerative joint disease with history of cervical spine fusion surgery. Left inguinal hernia repair. Cervical laminectomy C5-C8. Bilateral carpal tunnel release surgeries.  Medication allergies: Trazodone caused dizziness. Ciprofloxacin caused dizziness.  Exam: The patient is alert and lying comfortably on the endoscopy stretcher. Abdomen is soft and nontender to palpation. Lungs are clear to auscultation. Cardiac exam reveals a regular rhythm.  Plan: Proceed with surveillance colonoscopy

## 2015-10-21 NOTE — Anesthesia Preprocedure Evaluation (Signed)
Anesthesia Evaluation  Patient identified by MRN, date of birth, ID band Patient awake    Reviewed: Allergy & Precautions, NPO status , Patient's Chart, lab work & pertinent test results  Airway Mallampati: II   Neck ROM: full    Dental   Pulmonary neg pulmonary ROS,    breath sounds clear to auscultation       Cardiovascular hypertension,  Rhythm:regular Rate:Normal     Neuro/Psych Anxiety Depression    GI/Hepatic GERD  ,  Endo/Other    Renal/GU      Musculoskeletal  (+) Arthritis ,   Abdominal   Peds  Hematology   Anesthesia Other Findings   Reproductive/Obstetrics                             Anesthesia Physical Anesthesia Plan  ASA: II  Anesthesia Plan: MAC   Post-op Pain Management:    Induction: Intravenous  Airway Management Planned: Simple Face Mask  Additional Equipment:   Intra-op Plan:   Post-operative Plan:   Informed Consent: I have reviewed the patients History and Physical, chart, labs and discussed the procedure including the risks, benefits and alternatives for the proposed anesthesia with the patient or authorized representative who has indicated his/her understanding and acceptance.     Plan Discussed with: CRNA, Anesthesiologist and Surgeon  Anesthesia Plan Comments:         Anesthesia Quick Evaluation

## 2015-10-21 NOTE — Transfer of Care (Signed)
Immediate Anesthesia Transfer of Care Note  Patient: Brad [name]  Procedure(s) Performed: Procedure(s): COLONOSCOPY WITH PROPOFOL (N/A)  Patient Location: PACU  Anesthesia Type:MAC  Level of Consciousness: awake, alert  and oriented  Airway & Oxygen Therapy: Patient Spontanous Breathing and Patient connected to face mask oxygen  Post-op Assessment: Report given to RN and Post -op Vital signs reviewed and stable  Post vital signs: Reviewed and stable  Last Vitals:  Filed Vitals:   10/21/15 0814 10/21/15 0902  BP: 163/93   Pulse: 68 56  Temp: 36.7 C 37 C  Resp: 14 18    Complications: No apparent anesthesia complications

## 2015-10-21 NOTE — Anesthesia Postprocedure Evaluation (Signed)
Anesthesia Post Note  Patient: Brad [name]  Procedure(s) Performed: Procedure(s) (LRB): COLONOSCOPY WITH PROPOFOL (N/A)  Patient location during evaluation: PACU Anesthesia Type: MAC Level of consciousness: awake and alert Pain management: pain level controlled Vital Signs Assessment: post-procedure vital signs reviewed and stable Respiratory status: spontaneous breathing, nonlabored ventilation, respiratory function stable and patient connected to nasal cannula oxygen Cardiovascular status: stable and blood pressure returned to baseline Anesthetic complications: no    Last Vitals:  Filed Vitals:   10/21/15 0902 10/21/15 0903  BP:  99/63  Pulse: 56   Temp: 37 C   Resp: 18     Last Pain:  Filed Vitals:   10/21/15 0909  PainSc: 4                  Danah Reinecke S

## 2015-10-22 ENCOUNTER — Encounter (HOSPITAL_COMMUNITY): Payer: Self-pay | Admitting: Gastroenterology

## 2015-10-30 ENCOUNTER — Other Ambulatory Visit (HOSPITAL_COMMUNITY): Payer: Self-pay | Admitting: Psychiatry

## 2016-01-12 ENCOUNTER — Other Ambulatory Visit (HOSPITAL_COMMUNITY): Payer: Self-pay | Admitting: Psychiatry

## 2016-02-03 DIAGNOSIS — I10 Essential (primary) hypertension: Secondary | ICD-10-CM | POA: Diagnosis not present

## 2016-02-03 DIAGNOSIS — F419 Anxiety disorder, unspecified: Secondary | ICD-10-CM | POA: Diagnosis not present

## 2016-02-04 ENCOUNTER — Other Ambulatory Visit (HOSPITAL_COMMUNITY): Payer: Self-pay | Admitting: Psychiatry

## 2016-02-04 DIAGNOSIS — I10 Essential (primary) hypertension: Secondary | ICD-10-CM | POA: Diagnosis not present

## 2016-02-04 DIAGNOSIS — F419 Anxiety disorder, unspecified: Secondary | ICD-10-CM | POA: Diagnosis not present

## 2016-03-26 ENCOUNTER — Ambulatory Visit (INDEPENDENT_AMBULATORY_CARE_PROVIDER_SITE_OTHER): Payer: BLUE CROSS/BLUE SHIELD

## 2016-03-26 ENCOUNTER — Encounter (HOSPITAL_COMMUNITY): Payer: Self-pay | Admitting: Emergency Medicine

## 2016-03-26 ENCOUNTER — Ambulatory Visit (HOSPITAL_COMMUNITY)
Admission: EM | Admit: 2016-03-26 | Discharge: 2016-03-26 | Disposition: A | Discharge status: Home / Self Care | Payer: BLUE CROSS/BLUE SHIELD

## 2016-03-26 DIAGNOSIS — S99912A Unspecified injury of left ankle, initial encounter: Secondary | ICD-10-CM | POA: Diagnosis not present

## 2016-03-26 DIAGNOSIS — M7989 Other specified soft tissue disorders: Secondary | ICD-10-CM | POA: Diagnosis not present

## 2016-03-26 DIAGNOSIS — S9032XA Contusion of left foot, initial encounter: Secondary | ICD-10-CM | POA: Diagnosis not present

## 2016-03-26 MED ORDER — IBUPROFEN 800 MG PO TABS: 800.0000 mg | ORAL_TABLET | Freq: Three times a day (TID) | ORAL | 0 refills | Status: DC

## 2016-03-26 MED ORDER — TRAMADOL HCL 50 MG PO TABS: ORAL_TABLET | ORAL | 0 refills | Status: DC

## 2016-03-26 NOTE — ED Triage Notes (Signed)
The patient presented to the [facility] with a complaint of left foot pain secondary to a fall that occurred yesterday.

## 2016-03-26 NOTE — ED Provider Notes (Signed)
HPI  SUBJECTIVE:  Brad [name] is a 60 y.o. male who presents with left lateral foot pain, swelling, bruising after tripping over his cat, rolling his left foot outward. States that this occurred yesterday. He reports constant, throbbing pain. Symptoms are better with elevation and worse with walking, hanging it in dependent position. He has also tried ice for this. He reports numbness and tingling in the bottom of his foot, but no limitation of motion. Patient states he is able to move his toes okay. He denies any other foot or ankle pain. He has past medical history of high blood pressure. No history of anticoagulant antiplatelet use, diabetes, smoking. PMD: Dr. Leona Singleton.    Past Medical History:  Diagnosis Date  . Allergy   . Anxiety   . Depression   . DJD (degenerative joint disease), cervical    postiton with pillow under knees, cant turn neck   . Dysentery, amebic, acute 1981  . GERD (gastroesophageal reflux disease)   . H/O bronchitis   . H/O malaria 1984  . Hearing loss    bilateral   . Hypertension    labile Blood pressure  . Inguinal hernia   . Insomnia    early morning awakening  . MVP (mitral valve prolapse)    "no problems"  . Perianal pain   . Personal history of colonic polyps   . Tinnitus    right ear    Past Surgical History:  Procedure Laterality Date  . CARPAL TUNNEL RELEASE  10/06, 5/10   right wrist   . CARPAL TUNNEL RELEASE  3/10   left wrist   . cervical spine discectomy   09/2005  . COLONOSCOPY WITH PROPOFOL N/A 10/21/2015   Procedure: COLONOSCOPY WITH PROPOFOL;  Surgeon: Charolett Bumpers, MD;  Location: [facility];  Service: Endoscopy;  Laterality: N/A;  . INGUINAL HERNIA REPAIR  08/16/2011   Procedure: HERNIA REPAIR INGUINAL ADULT;  Surgeon: Valarie Merino, MD;  Location: [facility];  Service: General;  Laterality: Left;  . INGUINAL HERNIA REPAIR Right 05/03/2014   Procedure: OPEN RIGHT INGUINAL HERNIA REPAIR WITH MESH;   Surgeon: Wenda Low, MD;  Location: [facility];  Service: General;  Laterality: Right;  . INSERTION OF MESH Right 05/03/2014   Procedure: INSERTION OF MESH;  Surgeon: Wenda Low, MD;  Location: [facility];  Service: General;  Laterality: Right;  . TONSILLECTOMY  1960    Family History  Problem Relation Age of Onset  . Cancer Mother     breast    Social History  Substance Use Topics  . Smoking status: Never Smoker  . Smokeless tobacco: Never Used  . Alcohol use Yes     Comment: 2 wine daily    No current facility-administered medications for this encounter.   Current Outpatient Prescriptions:  .  amLODipine (NORVASC) 5 MG tablet, Take 5 mg by mouth daily., Disp: , Rfl:  .  busPIRone (BUSPAR) 10 MG tablet, Take 20 mg by mouth 3 (three) times daily. , Disp: , Rfl:  .  valsartan (DIOVAN) 160 MG tablet, Take 160 mg by mouth daily., Disp: , Rfl:  .  zolpidem (AMBIEN CR) 12.5 MG CR tablet, Take 12.5 mg by mouth at bedtime. , Disp: , Rfl:  .  ALPRAZolam (XANAX) 0.5 MG tablet, Take 0.5 mg by mouth at bedtime as needed for anxiety., Disp: , Rfl:  .  Cholecalciferol (VITAMIN D-3) 5000 UNITS TABS, Take 5,000 Units by mouth daily. , Disp: , Rfl:  .  co-enzyme Q-10 50 MG capsule, Take 50 mg by mouth every morning., Disp: , Rfl:  .  DHEA 25 MG CAPS, Take 25 mg by mouth every morning. , Disp: , Rfl:  .  glucosamine-chondroitin 500-400 MG tablet, Take 1 tablet by mouth daily. , Disp: , Rfl:  .  ibuprofen (ADVIL,MOTRIN) 800 MG tablet, Take 1 tablet (800 mg total) by mouth 3 (three) times daily., Disp: 30 tablet, Rfl: 0 .  Multiple Vitamin (MULTIVITAMIN WITH MINERALS) TABS tablet, Take 1 tablet by mouth daily., Disp: , Rfl:  .  Omega 3 1000 MG CAPS, Take 2,000 mg by mouth daily. , Disp: , Rfl:  .  OVER THE COUNTER MEDICATION, Place 1-2 drops into both eyes 2 (two) times daily. MIneral Oil Eye Drops, Disp: , Rfl:  .  sildenafil (VIAGRA) 100 MG tablet, Take 50 mg by mouth daily as needed for erectile  dysfunction. , Disp: , Rfl:  .  Suvorexant (BELSOMRA) 20 MG TABS, Take 20 mg by mouth at bedtime as needed (sleep). , Disp: , Rfl:  .  traMADol (ULTRAM) 50 MG tablet, 1-2 tabs po q 6 hr prn pain Maximum dose= 8 tablets per day, Disp: 20 tablet, Rfl: 0 .  valsartan-hydrochlorothiazide (DIOVAN-HCT) 160-25 MG per tablet, Take 1 tablet by mouth every morning. MWF a whole tablet and half a tablet on all other days., Disp: , Rfl:  .  zaleplon (SONATA) 10 MG capsule, Take 10-20 mg by mouth at bedtime as needed (For early morning awakening.). , Disp: , Rfl:  .  Zinc 50 MG CAPS, Take 50 mg by mouth every morning. , Disp: , Rfl:   No Known Allergies   ROS  As noted in HPI.   Physical Exam  BP 145/83 (BP Location: Right Arm)   Pulse 69   Temp 97.9 F (36.6 C) (Oral)   Resp 16   SpO2 100%   Constitutional: Well developed, well nourished, no acute distress Eyes:  EOMI, conjunctiva normal bilaterally HENT: Normocephalic, atraumatic,mucus membranes moist Respiratory: Normal inspiratory effort Cardiovascular: Normal rate GI: nondistended skin: No rash, skin intact Musculoskeletal: Positive swelling, bruising at fifth MTP left foot. Positive tenderness. No bony tenderness. Patient able to move all toes. Sensation grossly intact and equal over entire foot. Cap refill less than 2 seconds. No other evidence of injury to the foot or ankle. Neurologic: Alert & oriented x 3, no focal neuro deficits Psychiatric: Speech and behavior appropriate   ED Course   Medications - No data to display  Orders Placed This Encounter  Procedures  . DG Foot Complete Left    Standing Status:   Standing    Number of Occurrences:   1    Order Specific Question:   Reason for Exam (SYMPTOM  OR DIAGNOSIS REQUIRED)    Answer:   fall yesterday    No results found for this or any previous visit (from the past 24 hour(s)). Dg Foot Complete Left  Result Date: 03/26/2016 CLINICAL DATA:  Status post fall.  Left foot  injury. EXAM: LEFT FOOT - COMPLETE 3+ VIEW COMPARISON:  None. FINDINGS: No acute fracture or dislocation. Mild osteoarthritis of the first MTP joint. No lytic or sclerotic osseous lesion. Severe soft tissue swelling over the left fifth MTP joint. IMPRESSION: 1.  No acute osseous injury of the left foot. 2. Severe soft tissue swelling over the fifth MTP joint the concerning for a hematoma. Electronically Signed   By: Elige Ko   On: 03/26/2016 17:00  ED Clinical Impression  Contusion, foot, left, initial encounter   ED Assessment/Plan  Reviewed imaging independently. Soft tissue swelling fifth MTP joint. No fracture. See radiology report for details.   Home with supportive treatment, ice, elevation, rest. Ibuprofen, tramadol as needed. Follow-up with primary care physician as needed.  Discussed labs, imaging, MDM, plan and followup with patient  Discussed sn/sx that should prompt return to the ED. Patient agrees with plan.   *This clinic note was created using Dragon dictation software. Therefore, there may be occasional mistakes despite careful proofreading.  ?   Domenick Gong, MD 03/26/16 [phone]

## 2016-03-29 DIAGNOSIS — F3341 Major depressive disorder, recurrent, in partial remission: Secondary | ICD-10-CM | POA: Diagnosis not present

## 2016-06-04 DIAGNOSIS — F3341 Major depressive disorder, recurrent, in partial remission: Secondary | ICD-10-CM | POA: Diagnosis not present

## 2016-06-16 DIAGNOSIS — Z23 Encounter for immunization: Secondary | ICD-10-CM | POA: Diagnosis not present

## 2016-06-21 DIAGNOSIS — M79672 Pain in left foot: Secondary | ICD-10-CM | POA: Diagnosis not present

## 2016-06-21 DIAGNOSIS — N529 Male erectile dysfunction, unspecified: Secondary | ICD-10-CM | POA: Diagnosis not present

## 2016-08-19 DIAGNOSIS — H524 Presbyopia: Secondary | ICD-10-CM | POA: Diagnosis not present

## 2016-09-02 DIAGNOSIS — N528 Other male erectile dysfunction: Secondary | ICD-10-CM | POA: Diagnosis not present

## 2016-09-02 DIAGNOSIS — E559 Vitamin D deficiency, unspecified: Secondary | ICD-10-CM | POA: Diagnosis not present

## 2016-09-02 DIAGNOSIS — G4700 Insomnia, unspecified: Secondary | ICD-10-CM | POA: Diagnosis not present

## 2016-09-02 DIAGNOSIS — Z79899 Other long term (current) drug therapy: Secondary | ICD-10-CM | POA: Diagnosis not present

## 2016-09-02 DIAGNOSIS — I10 Essential (primary) hypertension: Secondary | ICD-10-CM | POA: Diagnosis not present

## 2016-09-02 DIAGNOSIS — Z86010 Personal history of colonic polyps: Secondary | ICD-10-CM | POA: Diagnosis not present

## 2016-09-02 DIAGNOSIS — Z0000 Encounter for general adult medical examination without abnormal findings: Secondary | ICD-10-CM | POA: Diagnosis not present

## 2016-09-02 DIAGNOSIS — F329 Major depressive disorder, single episode, unspecified: Secondary | ICD-10-CM | POA: Diagnosis not present

## 2016-09-07 DIAGNOSIS — M5416 Radiculopathy, lumbar region: Secondary | ICD-10-CM | POA: Diagnosis not present

## 2016-09-10 DIAGNOSIS — E7800 Pure hypercholesterolemia, unspecified: Secondary | ICD-10-CM | POA: Diagnosis not present

## 2016-10-08 DIAGNOSIS — J069 Acute upper respiratory infection, unspecified: Secondary | ICD-10-CM | POA: Diagnosis not present

## 2016-10-11 DIAGNOSIS — F3341 Major depressive disorder, recurrent, in partial remission: Secondary | ICD-10-CM | POA: Diagnosis not present

## 2016-12-06 DIAGNOSIS — I10 Essential (primary) hypertension: Secondary | ICD-10-CM | POA: Diagnosis not present

## 2016-12-06 DIAGNOSIS — G4700 Insomnia, unspecified: Secondary | ICD-10-CM | POA: Diagnosis not present

## 2016-12-06 DIAGNOSIS — K648 Other hemorrhoids: Secondary | ICD-10-CM | POA: Diagnosis not present

## 2016-12-06 DIAGNOSIS — E7800 Pure hypercholesterolemia, unspecified: Secondary | ICD-10-CM | POA: Diagnosis not present

## 2016-12-06 DIAGNOSIS — F329 Major depressive disorder, single episode, unspecified: Secondary | ICD-10-CM | POA: Diagnosis not present

## 2016-12-08 DIAGNOSIS — K649 Unspecified hemorrhoids: Secondary | ICD-10-CM | POA: Diagnosis not present

## 2016-12-08 DIAGNOSIS — K625 Hemorrhage of anus and rectum: Secondary | ICD-10-CM | POA: Diagnosis not present

## 2016-12-10 DIAGNOSIS — F411 Generalized anxiety disorder: Secondary | ICD-10-CM | POA: Diagnosis not present

## 2016-12-13 DIAGNOSIS — F3341 Major depressive disorder, recurrent, in partial remission: Secondary | ICD-10-CM | POA: Diagnosis not present

## 2016-12-29 DIAGNOSIS — F411 Generalized anxiety disorder: Secondary | ICD-10-CM | POA: Diagnosis not present

## 2017-01-04 DIAGNOSIS — M5136 Other intervertebral disc degeneration, lumbar region: Secondary | ICD-10-CM | POA: Diagnosis not present

## 2017-01-18 DIAGNOSIS — F411 Generalized anxiety disorder: Secondary | ICD-10-CM | POA: Diagnosis not present

## 2017-01-25 DIAGNOSIS — N946 Dysmenorrhea, unspecified: Secondary | ICD-10-CM | POA: Diagnosis not present

## 2017-02-01 DIAGNOSIS — F411 Generalized anxiety disorder: Secondary | ICD-10-CM | POA: Diagnosis not present

## 2017-02-07 DIAGNOSIS — G4700 Insomnia, unspecified: Secondary | ICD-10-CM | POA: Diagnosis not present

## 2017-02-07 DIAGNOSIS — M5136 Other intervertebral disc degeneration, lumbar region: Secondary | ICD-10-CM | POA: Diagnosis not present

## 2017-02-07 DIAGNOSIS — R351 Nocturia: Secondary | ICD-10-CM | POA: Diagnosis not present

## 2017-02-14 DIAGNOSIS — F3341 Major depressive disorder, recurrent, in partial remission: Secondary | ICD-10-CM | POA: Diagnosis not present

## 2017-02-18 DIAGNOSIS — F411 Generalized anxiety disorder: Secondary | ICD-10-CM | POA: Diagnosis not present

## 2017-03-07 DIAGNOSIS — F411 Generalized anxiety disorder: Secondary | ICD-10-CM | POA: Diagnosis not present

## 2017-03-14 DIAGNOSIS — F411 Generalized anxiety disorder: Secondary | ICD-10-CM | POA: Diagnosis not present

## 2017-03-21 DIAGNOSIS — F411 Generalized anxiety disorder: Secondary | ICD-10-CM | POA: Diagnosis not present

## 2017-05-12 DIAGNOSIS — I10 Essential (primary) hypertension: Secondary | ICD-10-CM | POA: Diagnosis not present

## 2017-05-12 DIAGNOSIS — L309 Dermatitis, unspecified: Secondary | ICD-10-CM | POA: Diagnosis not present

## 2017-05-12 DIAGNOSIS — N486 Induration penis plastica: Secondary | ICD-10-CM | POA: Diagnosis not present

## 2017-05-12 DIAGNOSIS — L700 Acne vulgaris: Secondary | ICD-10-CM | POA: Diagnosis not present

## 2017-05-16 DIAGNOSIS — F3341 Major depressive disorder, recurrent, in partial remission: Secondary | ICD-10-CM | POA: Diagnosis not present

## 2017-05-16 DIAGNOSIS — F411 Generalized anxiety disorder: Secondary | ICD-10-CM | POA: Diagnosis not present

## 2017-05-23 DIAGNOSIS — F411 Generalized anxiety disorder: Secondary | ICD-10-CM | POA: Diagnosis not present

## 2017-05-30 DIAGNOSIS — F411 Generalized anxiety disorder: Secondary | ICD-10-CM | POA: Diagnosis not present

## 2017-06-06 DIAGNOSIS — F411 Generalized anxiety disorder: Secondary | ICD-10-CM | POA: Diagnosis not present

## 2017-06-20 DIAGNOSIS — F411 Generalized anxiety disorder: Secondary | ICD-10-CM | POA: Diagnosis not present

## 2017-07-04 DIAGNOSIS — F411 Generalized anxiety disorder: Secondary | ICD-10-CM | POA: Diagnosis not present

## 2017-07-06 DIAGNOSIS — Z23 Encounter for immunization: Secondary | ICD-10-CM | POA: Diagnosis not present

## 2017-07-11 DIAGNOSIS — F411 Generalized anxiety disorder: Secondary | ICD-10-CM | POA: Diagnosis not present

## 2017-08-15 DIAGNOSIS — F3341 Major depressive disorder, recurrent, in partial remission: Secondary | ICD-10-CM | POA: Diagnosis not present

## 2017-08-15 DIAGNOSIS — F411 Generalized anxiety disorder: Secondary | ICD-10-CM | POA: Diagnosis not present

## 2017-08-29 DIAGNOSIS — F411 Generalized anxiety disorder: Secondary | ICD-10-CM | POA: Diagnosis not present

## 2017-09-05 DIAGNOSIS — F411 Generalized anxiety disorder: Secondary | ICD-10-CM | POA: Diagnosis not present

## 2017-09-08 DIAGNOSIS — H2513 Age-related nuclear cataract, bilateral: Secondary | ICD-10-CM | POA: Diagnosis not present

## 2017-09-08 DIAGNOSIS — H40013 Open angle with borderline findings, low risk, bilateral: Secondary | ICD-10-CM | POA: Diagnosis not present

## 2017-10-17 DIAGNOSIS — F411 Generalized anxiety disorder: Secondary | ICD-10-CM | POA: Diagnosis not present

## 2017-11-10 DIAGNOSIS — K219 Gastro-esophageal reflux disease without esophagitis: Secondary | ICD-10-CM | POA: Diagnosis not present

## 2017-11-10 DIAGNOSIS — E7800 Pure hypercholesterolemia, unspecified: Secondary | ICD-10-CM | POA: Diagnosis not present

## 2017-11-10 DIAGNOSIS — I10 Essential (primary) hypertension: Secondary | ICD-10-CM | POA: Diagnosis not present

## 2017-11-10 DIAGNOSIS — G4700 Insomnia, unspecified: Secondary | ICD-10-CM | POA: Diagnosis not present

## 2017-11-10 DIAGNOSIS — E559 Vitamin D deficiency, unspecified: Secondary | ICD-10-CM | POA: Diagnosis not present

## 2017-11-10 DIAGNOSIS — Z0000 Encounter for general adult medical examination without abnormal findings: Secondary | ICD-10-CM | POA: Diagnosis not present

## 2017-11-10 DIAGNOSIS — Z79899 Other long term (current) drug therapy: Secondary | ICD-10-CM | POA: Diagnosis not present

## 2017-11-10 DIAGNOSIS — Z125 Encounter for screening for malignant neoplasm of prostate: Secondary | ICD-10-CM | POA: Diagnosis not present

## 2017-12-12 DIAGNOSIS — D225 Melanocytic nevi of trunk: Secondary | ICD-10-CM | POA: Diagnosis not present

## 2017-12-12 DIAGNOSIS — L812 Freckles: Secondary | ICD-10-CM | POA: Diagnosis not present

## 2017-12-12 DIAGNOSIS — L821 Other seborrheic keratosis: Secondary | ICD-10-CM | POA: Diagnosis not present

## 2017-12-14 DIAGNOSIS — F3341 Major depressive disorder, recurrent, in partial remission: Secondary | ICD-10-CM | POA: Diagnosis not present

## 2018-01-10 DIAGNOSIS — H04122 Dry eye syndrome of left lacrimal gland: Secondary | ICD-10-CM | POA: Diagnosis not present

## 2018-02-23 DIAGNOSIS — F3341 Major depressive disorder, recurrent, in partial remission: Secondary | ICD-10-CM | POA: Diagnosis not present

## 2018-04-12 DIAGNOSIS — Z131 Encounter for screening for diabetes mellitus: Secondary | ICD-10-CM | POA: Diagnosis not present

## 2018-04-12 DIAGNOSIS — R202 Paresthesia of skin: Secondary | ICD-10-CM | POA: Diagnosis not present

## 2018-04-12 DIAGNOSIS — G4700 Insomnia, unspecified: Secondary | ICD-10-CM | POA: Diagnosis not present

## 2018-04-28 DIAGNOSIS — G4700 Insomnia, unspecified: Secondary | ICD-10-CM | POA: Diagnosis not present

## 2018-04-28 DIAGNOSIS — F418 Other specified anxiety disorders: Secondary | ICD-10-CM | POA: Diagnosis not present

## 2018-04-28 DIAGNOSIS — I10 Essential (primary) hypertension: Secondary | ICD-10-CM | POA: Diagnosis not present

## 2018-05-04 ENCOUNTER — Encounter: Payer: Self-pay | Admitting: Neurology

## 2018-05-18 DIAGNOSIS — Z23 Encounter for immunization: Secondary | ICD-10-CM | POA: Diagnosis not present

## 2018-05-24 DIAGNOSIS — I10 Essential (primary) hypertension: Secondary | ICD-10-CM | POA: Diagnosis not present

## 2018-05-24 DIAGNOSIS — F3341 Major depressive disorder, recurrent, in partial remission: Secondary | ICD-10-CM | POA: Diagnosis not present

## 2018-06-28 ENCOUNTER — Other Ambulatory Visit (INDEPENDENT_AMBULATORY_CARE_PROVIDER_SITE_OTHER): Payer: BLUE CROSS/BLUE SHIELD

## 2018-06-28 ENCOUNTER — Encounter: Payer: Self-pay | Admitting: Neurology

## 2018-06-28 ENCOUNTER — Ambulatory Visit: Payer: BLUE CROSS/BLUE SHIELD | Admitting: Neurology

## 2018-06-28 VITALS — BP 160/80 | HR 102 | Ht 70.0 in | Wt 150.1 lb

## 2018-06-28 DIAGNOSIS — R202 Paresthesia of skin: Secondary | ICD-10-CM | POA: Diagnosis not present

## 2018-06-28 DIAGNOSIS — M48061 Spinal stenosis, lumbar region without neurogenic claudication: Secondary | ICD-10-CM

## 2018-06-28 LAB — FOLATE: FOLATE: 21.3 ng/mL (ref >5.9)

## 2018-06-28 LAB — VITAMIN B12: VITAMIN B 12: 665 pg/mL (ref 211–911)

## 2018-06-28 NOTE — Progress Notes (Signed)
[name] Neurology Division Clinic Note - Initial Visit   Date: 06/28/18  Brad [name] MRN: [id number] DOB: 1956/07/17   Dear Dr. Kevan Ny:  Thank you for your kind referral of Brad [name] for consultation of bilatearl feet paresthesias. Although his history is well known to you, please allow [place] to reiterate it for the purpose of our medical record. The patient was accompanied to the clinic by self.  History of Present Illness: Brad [name] is a 62 y.o. Caucasian male with depression/anxiety, hypertension, mitral valve prolapse, insomnia, s/p ACDF at C5-T1, and known lumbar degenerative disease presenting for evaluation of bilateral feet paresthesias.    Starting in 2017, he began having tingling, burning sensation over the feet, which has gradually involved his lower legs.  He feels as if there is "clay" in at the bottoms of his feet.  Rest tends to improve symptoms.  Prolonged sitting and standing exacerbates symptoms.  He has pain with exertion and stress.  He has been seen pain management at [facility] for chronic low back pain and takes percocet half-tablet several times per week as needed for low back pain.  He has known degenerative lumbar disease, which is being managed conservatively.  He has not suffered falls and does not have weakness.   He endorses some imbalance such as when in the shower.    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  He endorses being under a great deal of stress for the past several years and has been resorting to drinking alcohol to manage his stress.  He endorses drinking 4 glasses wine nightly for the past two years.    Out-side paper records, electronic medical record, and images have been reviewed where available and summarized as:  CT cervical spine 02/06/2010: 1.  Technically successful lumbar puncture for cervical myelogram. 2.  C5-T1 ACDF with completed fusion at C5, C6, and C7. 3.  C3-C4 spondylosis.   MRI  brain 02/02/2008:  Mild premature atrophy.  Abnormal T2 bright signal globus pallidus.  MRA head and neck 02/02/2008:  Normal  Past Medical History:  Diagnosis Date  . Allergy   . Anxiety   . Depression   . DJD (degenerative joint disease), cervical    postiton with pillow under knees, cant turn neck   . Dysentery, amebic, acute 1981  . GERD (gastroesophageal reflux disease)   . H/O bronchitis   . H/O malaria 1984  . Hearing loss    bilateral   . Hypertension    labile Blood pressure  . Inguinal hernia   . Insomnia    early morning awakening  . MVP (mitral valve prolapse)    "no problems"  . Perianal pain   . Personal history of colonic polyps   . Tinnitus    right ear    Past Surgical History:  Procedure Laterality Date  . CARPAL TUNNEL RELEASE  10/06, 5/10   right wrist   . CARPAL TUNNEL RELEASE  3/10   left wrist   . cervical spine discectomy   09/2005  . COLONOSCOPY WITH PROPOFOL N/A 10/21/2015   Procedure: COLONOSCOPY WITH PROPOFOL;  Surgeon: Charolett Bumpers, MD;  Location: [facility];  Service: Endoscopy;  Laterality: N/A;  . INGUINAL HERNIA REPAIR  08/16/2011   Procedure: HERNIA REPAIR INGUINAL ADULT;  Surgeon: Valarie Merino, MD;  Location: [facility];  Service: General;  Laterality: Left;  . INGUINAL HERNIA REPAIR Right 05/03/2014   Procedure: OPEN RIGHT  INGUINAL HERNIA REPAIR WITH MESH;  Surgeon: Wenda Low, MD;  Location: [facility];  Service: General;  Laterality: Right;  . INSERTION OF MESH Right 05/03/2014   Procedure: INSERTION OF MESH;  Surgeon: Wenda Low, MD;  Location: [facility];  Service: General;  Laterality: Right;  . TONSILLECTOMY  1960     Medications:  Outpatient Encounter Medications as of 06/28/2018  Medication Sig  . ALPRAZolam (XANAX) 0.5 MG tablet Take 0.5 mg by mouth at bedtime as needed for anxiety.  Marland Kitchen amLODipine (NORVASC) 5 MG tablet Take 5 mg by mouth daily.  . busPIRone (BUSPAR) 10 MG tablet Take 20 mg by mouth 3 (three)  times daily.   . Cholecalciferol (VITAMIN D-3) 5000 UNITS TABS Take 5,000 Units by mouth daily.   Marland Kitchen co-enzyme Q-10 50 MG capsule Take 50 mg by mouth every morning.  Marland Kitchen DHEA 25 MG CAPS Take 25 mg by mouth every morning.   Marland Kitchen glucosamine-chondroitin 500-400 MG tablet Take 1 tablet by mouth daily.   Marland Kitchen losartan (COZAAR) 100 MG tablet   . losartan (COZAAR) 50 MG tablet   . Multiple Vitamin (MULTIVITAMIN WITH MINERALS) TABS tablet Take 1 tablet by mouth daily.  . Omega 3 1000 MG CAPS Take 2,000 mg by mouth daily.   Marland Kitchen OVER THE COUNTER MEDICATION Place 1-2 drops into both eyes 2 (two) times daily. MIneral Oil Eye Drops  . sildenafil (VIAGRA) 100 MG tablet Take 50 mg by mouth daily as needed for erectile dysfunction.   . triazolam (HALCION) 0.25 MG tablet   . zaleplon (SONATA) 10 MG capsule Take 10-20 mg by mouth at bedtime as needed (For early morning awakening.).   Marland Kitchen Zinc 50 MG CAPS Take 50 mg by mouth every morning.   . zolpidem (AMBIEN CR) 12.5 MG CR tablet Take 12.5 mg by mouth at bedtime.   . [DISCONTINUED] ibuprofen (ADVIL,MOTRIN) 800 MG tablet Take 1 tablet (800 mg total) by mouth 3 (three) times daily.  . [DISCONTINUED] Suvorexant (BELSOMRA) 20 MG TABS Take 20 mg by mouth at bedtime as needed (sleep).   . [DISCONTINUED] traMADol (ULTRAM) 50 MG tablet 1-2 tabs po q 6 hr prn pain Maximum dose= 8 tablets per day  . [DISCONTINUED] valsartan (DIOVAN) 160 MG tablet Take 160 mg by mouth daily.  . [DISCONTINUED] valsartan-hydrochlorothiazide (DIOVAN-HCT) 160-25 MG per tablet Take 1 tablet by mouth every morning. MWF a whole tablet and half a tablet on all other days.   No facility-administered encounter medications on file as of 06/28/2018.      Allergies: No Known Allergies  Family History: Family History  Problem Relation Age of Onset  . Cancer Mother        breast  . Intracerebral hemorrhage Father     Social History: Social History   Tobacco Use  . Smoking status: Never Smoker  .  Smokeless tobacco: Never Used  Substance Use Topics  . Alcohol use: Yes    Comment: 2 wine daily  . Drug use: Yes    Types: Marijuana    Comment: weekend use   Social History   Social History Narrative   Lives with wife and son in a 3 story home.  His daughter passed away from drug overdose.  He is a self employed Counsellor.      Review of Systems:  CONSTITUTIONAL: No fevers, chills, night sweats, or weight loss.   EYES: No visual changes or eye pain ENT: No hearing changes.  No history of nose bleeds.  RESPIRATORY: No cough, wheezing and shortness of breath.   CARDIOVASCULAR: Negative for chest pain, and palpitations.   GI: Negative for abdominal discomfort, blood in stools or black stools.  No recent change in bowel habits.   GU:  No history of incontinence.   MUSCLOSKELETAL: No history of joint pain or swelling.  No myalgias.   SKIN: Negative for lesions, rash, and itching.   HEMATOLOGY/ONCOLOGY: Negative for prolonged bleeding, bruising easily, and swollen nodes.  No history of cancer.   ENDOCRINE: Negative for cold or heat intolerance, polydipsia or goiter.   PSYCH:  +depression +anxiety symptoms.   NEURO: As Above.   Vital Signs:  BP (!) 160/80   Pulse (!) 102   Ht 5\' 10"  (1.778 m)   Wt 150 lb 2 oz (68.1 kg)   SpO2 98%   BMI 21.54 kg/m    General Medical Exam:   General:  Well appearing, appears restless.   Eyes/ENT: see cranial nerve examination.   Neck: No masses appreciated.  Full range of motion without tenderness.  No carotid bruits. Respiratory:  Clear to auscultation, good air entry bilaterally.   Cardiac:  Regular rate and rhythm, no murmur.   Extremities:  No deformities, edema, or skin discoloration.  Skin:  No rashes or lesions.  Neurological Exam: MENTAL STATUS including orientation to time, place, person, recent and remote memory, language, and fund of knowledge is normal. Attention span and concentration is fair.  Slightly erratic  behavior with voluntary commands, such as motor testing.  Speech is not dysarthric.  CRANIAL NERVES: II:  No visual field defects.  Unremarkable fundi.   III-IV-VI: Pupils equal round and reactive to light.  Normal conjugate, extra-ocular eye movements in all directions of gaze.  No nystagmus.  No ptosis.   V:  Normal facial sensation.   VII:  Normal facial symmetry and movements.  No pathologic facial reflexes.  VIII:  Normal hearing and vestibular function.   IX-X:  Normal palatal movement.   XI:  Normal shoulder shrug and head rotation.   XII:  Normal tongue strength and range of motion, no deviation or fasciculation.  MOTOR:  No atrophy, fasciculations or abnormal movements.  No pronator drift.  Tone is normal.    Right Upper Extremity:    Left Upper Extremity:    Deltoid  5/5   Deltoid  5/5   Biceps  5/5   Biceps  5/5   Triceps  5/5   Triceps  5/5   Wrist extensors  5/5   Wrist extensors  5/5   Wrist flexors  5/5   Wrist flexors  5/5   Finger extensors  5/5   Finger extensors  5/5   Finger flexors  5/5   Finger flexors  5/5   Dorsal interossei  5/5   Dorsal interossei  5/5   Abductor pollicis  5/5   Abductor pollicis  5/5   Tone (Ashworth scale)  0  Tone (Ashworth scale)  0   Right Lower Extremity:    Left Lower Extremity:    Hip flexors  5/5   Hip flexors  5/5   Hip extensors  5/5   Hip extensors  5/5   Knee flexors  5/5   Knee flexors  5/5   Knee extensors  5/5   Knee extensors  5/5   Dorsiflexors  5/5   Dorsiflexors  5/5   Plantarflexors  5/5   Plantarflexors  5/5   Toe extensors  5/5   Toe extensors  5/5   Toe flexors  5/5   Toe flexors  5/5   Tone (Ashworth scale)  0  Tone (Ashworth scale)  0   MSRs:  Right                                                                 Left brachioradialis 2+  brachioradialis 2+  biceps 2+  biceps 2+  triceps 2+  triceps 2+  patellar 3+  patellar 3+  ankle jerk 2+  ankle jerk 2+  Hoffman no  Hoffman no  plantar response down   plantar response down   SENSORY:  Normal and symmetric perception of light touch, pinprick, vibration, and proprioception.  Romberg's sign absent.   COORDINATION/GAIT: Normal finger-to- nose-finger.  Intact rapid alternating movements bilaterally.  Able to rise from a chair without using arms.  Gait narrow based and stable. Tandem and stressed gait intact.    IMPRESSION: Brad [name] is a 62 year-old man referred for evaluation of bilateral feet and leg paresthesias.  With his history of alcohol use, neuropathy is considered, however he does not have signs of neuropathy on his exam. Reflexes are actually brisk at the patella and normal patella jerks, sensation and motor strength distally is also normal.  This may suggest that his symptoms could be stemming from lumbar canal stenosis.  To better localize his symptoms, he will undergo NCS/EMG of the legs.  Check vitamin B12, vitamin B1, folate, copper.  I have asked him to start to taper his alcohol use and consider seeing a therapist for his anxiety/stress or consider joining AA.  All questions were answered.   Thank you for allowing me to participate in patient's care.  If I can answer any additional questions, I would be pleased to do so.    Sincerely,    Donika K. Allena Katz, DO

## 2018-07-01 LAB — COPPER, SERUM: Copper: 104 ug/dL (ref 70–175)

## 2018-07-01 LAB — VITAMIN B1: Vitamin B1 (Thiamine): 15 nmol/L (ref 8–30)

## 2018-07-03 ENCOUNTER — Telehealth: Payer: Self-pay | Admitting: *Deleted

## 2018-07-03 NOTE — Telephone Encounter (Signed)
-----   Message from Glendale Chard, DO sent at 07/03/2018  9:08 AM EST ----- Please notify patient lab are within normal limits.  Thank you.

## 2018-07-03 NOTE — Telephone Encounter (Signed)
Patient informed that labs are normal  

## 2018-07-05 DIAGNOSIS — F3341 Major depressive disorder, recurrent, in partial remission: Secondary | ICD-10-CM | POA: Diagnosis not present

## 2018-07-06 DIAGNOSIS — L649 Androgenic alopecia, unspecified: Secondary | ICD-10-CM | POA: Diagnosis not present

## 2018-07-06 DIAGNOSIS — L218 Other seborrheic dermatitis: Secondary | ICD-10-CM | POA: Diagnosis not present

## 2018-07-18 DIAGNOSIS — F411 Generalized anxiety disorder: Secondary | ICD-10-CM | POA: Diagnosis not present

## 2018-08-03 ENCOUNTER — Ambulatory Visit (INDEPENDENT_AMBULATORY_CARE_PROVIDER_SITE_OTHER): Payer: BLUE CROSS/BLUE SHIELD | Admitting: Neurology

## 2018-08-03 DIAGNOSIS — R202 Paresthesia of skin: Secondary | ICD-10-CM | POA: Diagnosis not present

## 2018-08-03 DIAGNOSIS — M5417 Radiculopathy, lumbosacral region: Secondary | ICD-10-CM

## 2018-08-03 NOTE — Procedures (Signed)
[facility]  [address] [place], [address]  [place], [place] [address] Tel: [phone] Fax:  [phone] Test Date:  08/03/2018  Patient: Brad [name] DOB: Jan 11, 1956 Physician: Nita Sickle, DO  Sex: Male Height: 5\' 10"  Ref Phys: Nita Sickle, DO  ID#: [id number] Temp: 32.0C Technician:    Patient Complaints: This is a 62 year old man referred for evaluation of chronic low back pain and bilateral feet numbness.  NCV & EMG Findings: Extensive electrodiagnostic testing of the right lower extremity and additional studies of the left shows:  1. Bilateral sural and superficial peroneal sensory responses are within normal limits. 2. Bilateral peroneal and tibial motor responses are within normal limits. 3. Bilateral tibial H reflex studies are within normal limits. 4. Chronic motor axonal loss changes are isolated to the right rectus femoris and abductor longus muscles, without accompanied active denervation.  These findings are not present in the left lower extremity.   Impression: 1. Chronic L3-L4 radiculopathy affecting the right lower extremity, mild in degree electrically. 2. There is no evidence of a large fiber sensorimotor polyneuropathy affecting the lower extremities.   ___________________________ Nita Sickle, DO    Nerve Conduction Studies Anti Sensory Summary Table   Site NR Peak (ms) Norm Peak (ms) P-T Amp (V) Norm P-T Amp  Left Sup Peroneal Anti Sensory (Ant Lat Mall)  32C  12 cm    3.2 <4.6 6.9 >3  Right Sup Peroneal Anti Sensory (Ant Lat Mall)  32C  12 cm    2.6 <4.6 8.9 >3  Left Sural Anti Sensory (Lat Mall)  32C  Calf    3.7 <4.6 10.2 >3  Right Sural Anti Sensory (Lat Mall)  32C  Calf    3.7 <4.6 10.9 >3   Motor Summary Table   Site NR Onset (ms) Norm Onset (ms) O-P Amp (mV) Norm O-P Amp Site1 Site2 Delta-0 (ms) Dist (cm) Vel (m/s) Norm Vel (m/s)  Left Peroneal Motor (Ext Dig Brev)  32C  Ankle    4.2 <6.0 5.2 >2.5 B Fib Ankle 8.5 38.0 45 >40   B Fib    12.7  4.8  Poplt B Fib 1.4 8.0 57 >40  Poplt    14.1  4.7         Right Peroneal Motor (Ext Dig Brev)  32C  Ankle    3.6 <6.0 4.8 >2.5 B Fib Ankle 8.5 37.0 44 >40  B Fib    12.1  4.5  Poplt B Fib 1.3 9.0 69 >40  Poplt    13.4  4.2         Left Tibial Motor (Abd Hall Brev)  32C  Ankle    4.1 <6.0 8.0 >4 Knee Ankle 9.7 40.0 41 >40  Knee    13.8  6.0         Right Tibial Motor (Abd Hall Brev)  32C  Ankle    3.4 <6.0 6.3 >4 Knee Ankle 10.0 42.0 42 >40  Knee    13.4  3.9          H Reflex Studies   NR H-Lat (ms) Lat Norm (ms) L-R H-Lat (ms)  Left Tibial (Gastroc)  32C     34.83 <35 0.14  Right Tibial (Gastroc)  32C     34.97 <35 0.14   EMG   Side Muscle Ins Act Fibs Psw Fasc Number Recrt Dur Dur. Amp Amp. Poly Poly. Comment  Right AntTibialis Nml Nml Nml Nml Nml Nml Nml Nml Nml Nml Nml  Nml N/A  Right Gastroc Nml Nml Nml Nml Nml Nml Nml Nml Nml Nml Nml Nml N/A  Right RectFemoris Nml Nml Nml Nml 1- Rapid Some 1+ Some 1+ Few 1+ N/A  Right GluteusMed Nml Nml Nml Nml Nml Nml Nml Nml Nml Nml Nml Nml N/A  Right AdductorLong Nml Nml Nml Nml 1- Rapid Some 1+ Some 1+ Few 1+ N/A  Right Flex Dig Long Nml Nml Nml Nml Nml Nml Nml Nml Nml Nml Nml Nml N/A  Left AntTibialis Nml Nml Nml Nml Nml Nml Nml Nml Nml Nml Nml Nml N/A  Left Gastroc Nml Nml Nml Nml Nml Nml Nml Nml Nml Nml Nml Nml N/A  Left RectFemoris Nml Nml Nml Nml Nml Nml Nml Nml Nml Nml Nml Nml N/A  Left GluteusMed Nml Nml Nml Nml Nml Nml Nml Nml Nml Nml Nml Nml N/A  Left AdductorLong Nml Nml Nml Nml Nml Nml Nml Nml Nml Nml Nml Nml N/A      Waveforms:

## 2018-08-04 ENCOUNTER — Encounter

## 2018-08-04 ENCOUNTER — Ambulatory Visit: Payer: BLUE CROSS/BLUE SHIELD | Admitting: Neurology

## 2018-08-07 ENCOUNTER — Telehealth: Payer: Self-pay | Admitting: *Deleted

## 2018-08-07 NOTE — Telephone Encounter (Signed)
-----   Message from Glendale Chardonika K Patel, DO sent at 08/04/2018 11:06 AM EST ----- Please inform patient that his nerve testing does not show neuropathy, which is good news.  There is nerve impingement in the back on the right side so it is possible is leg symptoms are stemming from his back.  Is he seeing someone for his back currently or does he have a recent MRI lumbar spine that I can review?  We may need to request records for this.  Thanks.

## 2018-08-07 NOTE — Telephone Encounter (Signed)
Patient given results.  He is not seeing anyone for his back right now and has not had a recent MRI.

## 2018-08-07 NOTE — Telephone Encounter (Signed)
Please order MRI lumbar spine wo contrast for lumbar canal stenosis. Thanks.

## 2018-08-08 ENCOUNTER — Other Ambulatory Visit: Payer: Self-pay | Admitting: *Deleted

## 2018-08-08 DIAGNOSIS — M48061 Spinal stenosis, lumbar region without neurogenic claudication: Secondary | ICD-10-CM

## 2018-08-11 ENCOUNTER — Telehealth: Payer: Self-pay | Admitting: Neurology

## 2018-08-11 NOTE — Telephone Encounter (Signed)
Patient called needing to speak with you regarding the pain in his Legs. He also said that he has not heard from anyone to set up hi MRI. He said he's in a lot of pain. Please Call. Thanks

## 2018-08-14 NOTE — Telephone Encounter (Signed)
Patient wants to speak with Morrie Sheldonashley he is has not heard from the MRI place and also about the message that was left on Friday 08-11-18

## 2018-08-15 NOTE — Telephone Encounter (Signed)
Patient notified that we do not prescribe narcotics.  He said that the meds that we do prescribe do not work for him.  Instructed him to try a pain clinic and he said that he has done so in the past.  Informed him that we need the MRI done before we do anything else.  Called [facility] and they have his scheduled for 08-18-18.

## 2018-08-18 ENCOUNTER — Ambulatory Visit
Admission: RE | Admit: 2018-08-18 | Discharge: 2018-08-18 | Disposition: A | Discharge status: Home / Self Care | Payer: BLUE CROSS/BLUE SHIELD | Source: Ambulatory Visit | Attending: Neurology | Admitting: Neurology

## 2018-08-18 DIAGNOSIS — M48061 Spinal stenosis, lumbar region without neurogenic claudication: Secondary | ICD-10-CM

## 2018-08-21 ENCOUNTER — Telehealth: Payer: Self-pay | Admitting: Neurology

## 2018-08-21 DIAGNOSIS — M5416 Radiculopathy, lumbar region: Secondary | ICD-10-CM | POA: Diagnosis not present

## 2018-08-21 DIAGNOSIS — R030 Elevated blood-pressure reading, without diagnosis of hypertension: Secondary | ICD-10-CM | POA: Diagnosis not present

## 2018-08-21 NOTE — Telephone Encounter (Signed)
Called and informed patient of his MRI lumbar spine results which shows multilevel foraminal stenosis at right L2-3 and L3-4, left L4-5, and bilateral L5-S1.  He is scheduled to see Dr[facility] Coletta [facility] today for his low back pain.  With his increasing low back pain, I recommend he start PT and consider epidural steroid injections.  He will discuss with Dr. Franky Machoabbell and then decide how to proceed.    Donika K. Allena KatzPatel, DO

## 2018-09-11 DIAGNOSIS — M5136 Other intervertebral disc degeneration, lumbar region: Secondary | ICD-10-CM | POA: Diagnosis not present

## 2018-09-11 DIAGNOSIS — R030 Elevated blood-pressure reading, without diagnosis of hypertension: Secondary | ICD-10-CM | POA: Diagnosis not present

## 2018-09-11 DIAGNOSIS — M4156 Other secondary scoliosis, lumbar region: Secondary | ICD-10-CM | POA: Diagnosis not present

## 2018-09-14 DIAGNOSIS — H524 Presbyopia: Secondary | ICD-10-CM | POA: Diagnosis not present

## 2018-09-14 DIAGNOSIS — H40013 Open angle with borderline findings, low risk, bilateral: Secondary | ICD-10-CM | POA: Diagnosis not present

## 2018-09-14 DIAGNOSIS — H2513 Age-related nuclear cataract, bilateral: Secondary | ICD-10-CM | POA: Diagnosis not present

## 2018-09-18 DIAGNOSIS — M545 Low back pain: Secondary | ICD-10-CM | POA: Diagnosis not present

## 2018-09-21 DIAGNOSIS — M545 Low back pain: Secondary | ICD-10-CM | POA: Diagnosis not present

## 2018-09-21 DIAGNOSIS — G8929 Other chronic pain: Secondary | ICD-10-CM | POA: Diagnosis not present

## 2018-09-21 DIAGNOSIS — M5432 Sciatica, left side: Secondary | ICD-10-CM | POA: Diagnosis not present

## 2018-09-21 DIAGNOSIS — M5431 Sciatica, right side: Secondary | ICD-10-CM | POA: Diagnosis not present

## 2018-09-28 DIAGNOSIS — M5432 Sciatica, left side: Secondary | ICD-10-CM | POA: Diagnosis not present

## 2018-09-28 DIAGNOSIS — M545 Low back pain: Secondary | ICD-10-CM | POA: Diagnosis not present

## 2018-09-28 DIAGNOSIS — M5431 Sciatica, right side: Secondary | ICD-10-CM | POA: Diagnosis not present

## 2018-09-28 DIAGNOSIS — G8929 Other chronic pain: Secondary | ICD-10-CM | POA: Diagnosis not present

## 2018-10-04 DIAGNOSIS — F3341 Major depressive disorder, recurrent, in partial remission: Secondary | ICD-10-CM | POA: Diagnosis not present

## 2018-10-05 ENCOUNTER — Other Ambulatory Visit: Payer: Self-pay | Admitting: Internal Medicine

## 2018-10-05 ENCOUNTER — Ambulatory Visit
Admission: RE | Admit: 2018-10-05 | Discharge: 2018-10-05 | Disposition: A | Discharge status: Home / Self Care | Payer: BLUE CROSS/BLUE SHIELD | Source: Ambulatory Visit | Attending: Internal Medicine | Admitting: Internal Medicine

## 2018-10-05 DIAGNOSIS — R0989 Other specified symptoms and signs involving the circulatory and respiratory systems: Secondary | ICD-10-CM

## 2018-10-05 DIAGNOSIS — J209 Acute bronchitis, unspecified: Secondary | ICD-10-CM | POA: Diagnosis not present

## 2018-10-05 DIAGNOSIS — R05 Cough: Secondary | ICD-10-CM | POA: Diagnosis not present

## 2018-10-10 DIAGNOSIS — M5432 Sciatica, left side: Secondary | ICD-10-CM | POA: Diagnosis not present

## 2018-10-10 DIAGNOSIS — M545 Low back pain: Secondary | ICD-10-CM | POA: Diagnosis not present

## 2018-10-10 DIAGNOSIS — G8929 Other chronic pain: Secondary | ICD-10-CM | POA: Diagnosis not present

## 2018-10-10 DIAGNOSIS — M5431 Sciatica, right side: Secondary | ICD-10-CM | POA: Diagnosis not present

## 2018-10-11 DIAGNOSIS — G8929 Other chronic pain: Secondary | ICD-10-CM | POA: Diagnosis not present

## 2018-10-11 DIAGNOSIS — M5432 Sciatica, left side: Secondary | ICD-10-CM | POA: Diagnosis not present

## 2018-10-11 DIAGNOSIS — M5431 Sciatica, right side: Secondary | ICD-10-CM | POA: Diagnosis not present

## 2018-10-11 DIAGNOSIS — M545 Low back pain: Secondary | ICD-10-CM | POA: Diagnosis not present

## 2018-10-12 DIAGNOSIS — I10 Essential (primary) hypertension: Secondary | ICD-10-CM | POA: Diagnosis not present

## 2018-10-12 DIAGNOSIS — M4156 Other secondary scoliosis, lumbar region: Secondary | ICD-10-CM | POA: Diagnosis not present

## 2018-10-13 DIAGNOSIS — I10 Essential (primary) hypertension: Secondary | ICD-10-CM | POA: Diagnosis not present

## 2018-10-13 DIAGNOSIS — M5416 Radiculopathy, lumbar region: Secondary | ICD-10-CM | POA: Diagnosis not present

## 2018-10-13 DIAGNOSIS — M47816 Spondylosis without myelopathy or radiculopathy, lumbar region: Secondary | ICD-10-CM | POA: Diagnosis not present

## 2018-10-13 DIAGNOSIS — M5136 Other intervertebral disc degeneration, lumbar region: Secondary | ICD-10-CM | POA: Diagnosis not present

## 2018-10-17 DIAGNOSIS — I10 Essential (primary) hypertension: Secondary | ICD-10-CM | POA: Diagnosis not present

## 2018-10-17 DIAGNOSIS — K1379 Other lesions of oral mucosa: Secondary | ICD-10-CM | POA: Diagnosis not present

## 2018-10-17 DIAGNOSIS — G4700 Insomnia, unspecified: Secondary | ICD-10-CM | POA: Diagnosis not present

## 2018-10-17 DIAGNOSIS — R209 Unspecified disturbances of skin sensation: Secondary | ICD-10-CM | POA: Diagnosis not present

## 2018-10-20 ENCOUNTER — Other Ambulatory Visit: Payer: Self-pay | Admitting: *Deleted

## 2018-10-20 ENCOUNTER — Telehealth: Payer: Self-pay | Admitting: Neurology

## 2018-10-20 MED ORDER — GABAPENTIN 300 MG PO CAPS: 300.0000 mg | ORAL_CAPSULE | Freq: Every day | ORAL | 0 refills | Status: DC

## 2018-10-20 NOTE — Telephone Encounter (Signed)
OK to refer to [facility] for second opinion for leg pain. OK to give 30-day supply of gabapentin 300mg  at bedtime, I do not recommend Lyrica.  He would benefit from seeing pain management for ongoing medication recommendations, as we do not manage chronic pain.

## 2018-10-20 NOTE — Telephone Encounter (Signed)
Where would you like to refer him and can he get Lyrica?

## 2018-10-20 NOTE — Telephone Encounter (Signed)
Gabapentin sent in.  Will send referral to [facility].

## 2018-10-20 NOTE — Telephone Encounter (Signed)
Patient does want a  Referral to a Dr that can do the testing that he needs to have done and also wants to talk to Morrie Sheldon about maybe getting Lyrica for the pain please call

## 2018-10-21 ENCOUNTER — Encounter (HOSPITAL_COMMUNITY): Payer: Self-pay

## 2018-10-21 ENCOUNTER — Emergency Department (HOSPITAL_COMMUNITY)
Admission: EM | Admit: 2018-10-21 | Discharge: 2018-10-22 | Disposition: A | Discharge status: Home / Self Care | Payer: BLUE CROSS/BLUE SHIELD | Attending: Emergency Medicine | Admitting: Emergency Medicine

## 2018-10-21 DIAGNOSIS — R079 Chest pain, unspecified: Secondary | ICD-10-CM | POA: Diagnosis not present

## 2018-10-21 DIAGNOSIS — R51 Headache: Secondary | ICD-10-CM | POA: Diagnosis not present

## 2018-10-21 DIAGNOSIS — R030 Elevated blood-pressure reading, without diagnosis of hypertension: Secondary | ICD-10-CM

## 2018-10-21 DIAGNOSIS — F419 Anxiety disorder, unspecified: Secondary | ICD-10-CM | POA: Insufficient documentation

## 2018-10-21 DIAGNOSIS — R1013 Epigastric pain: Secondary | ICD-10-CM | POA: Diagnosis not present

## 2018-10-21 DIAGNOSIS — I10 Essential (primary) hypertension: Secondary | ICD-10-CM | POA: Diagnosis not present

## 2018-10-21 DIAGNOSIS — Z79899 Other long term (current) drug therapy: Secondary | ICD-10-CM | POA: Insufficient documentation

## 2018-10-21 DIAGNOSIS — R5381 Other malaise: Secondary | ICD-10-CM | POA: Diagnosis not present

## 2018-10-21 DIAGNOSIS — R52 Pain, unspecified: Secondary | ICD-10-CM | POA: Diagnosis not present

## 2018-10-21 LAB — BASIC METABOLIC PANEL
Anion gap: 12 (ref 5–15)
BUN: 12 mg/dL (ref 8–23)
CHLORIDE: 97 mmol/L — AB (ref 98–111)
CO2: 23 mmol/L (ref 22–32)
Calcium: 9.6 mg/dL (ref 8.9–10.3)
Creatinine, Ser: 0.97 mg/dL (ref 0.61–1.24)
GFR calc Af Amer: >60 mL/min (ref >60)
GFR calc non Af Amer: >60 mL/min (ref >60)
GLUCOSE: 102 mg/dL — AB (ref 70–99)
Potassium: 3.4 mmol/L — ABNORMAL LOW (ref 3.5–5.1)
SODIUM: 132 mmol/L — AB (ref 135–145)

## 2018-10-21 LAB — CBC
HCT: 40.7 % (ref 39.0–52.0)
HEMOGLOBIN: 13.8 g/dL (ref 13.0–17.0)
MCH: 30.6 pg (ref 26.0–34.0)
MCHC: 33.9 g/dL (ref 30.0–36.0)
MCV: 90.2 fL (ref 80.0–100.0)
Platelets: 206 10*3/uL (ref 150–400)
RBC: 4.51 MIL/uL (ref 4.22–5.81)
RDW: 12.5 % (ref 11.5–15.5)
WBC: 7.3 10*3/uL (ref 4.0–10.5)
nRBC: 0 % (ref 0.0–0.2)

## 2018-10-21 NOTE — ED Triage Notes (Addendum)
Per EMS, pt arrives due to HTN and anxiety. Per EMS, pt smoked marijuana in the past couple hours. Last BP with EMS 180/108. Recent dx of HTN. Pt on amlodipine and HCTZ. Pt endorses taking his medicine today. Pt anxious in triage. Pt c/o bilateral leg pain, hx neuropathy. Pt denies thoughts of harming self or others.

## 2018-10-22 ENCOUNTER — Emergency Department (HOSPITAL_COMMUNITY): Payer: BLUE CROSS/BLUE SHIELD

## 2018-10-22 DIAGNOSIS — I10 Essential (primary) hypertension: Secondary | ICD-10-CM | POA: Diagnosis not present

## 2018-10-22 DIAGNOSIS — R079 Chest pain, unspecified: Secondary | ICD-10-CM | POA: Diagnosis not present

## 2018-10-22 LAB — RAPID URINE DRUG SCREEN, HOSP PERFORMED
Amphetamines: NOT DETECTED
Barbiturates: NOT DETECTED
Benzodiazepines: POSITIVE — AB
Cocaine: NOT DETECTED
Opiates: NOT DETECTED
Tetrahydrocannabinol: POSITIVE — AB

## 2018-10-22 LAB — I-STAT TROPONIN, ED: Troponin i, poc: 0.01 ng/mL (ref 0.00–0.08)

## 2018-10-22 MED ORDER — DIPHENHYDRAMINE HCL 50 MG/ML IJ SOLN
25.0000 mg | Freq: Once | INTRAMUSCULAR | Status: AC
  Administered 2018-10-22: 25 mg via INTRAVENOUS
  Filled 2018-10-22: qty 1

## 2018-10-22 MED ORDER — PROCHLORPERAZINE EDISYLATE 10 MG/2ML IJ SOLN
10.0000 mg | Freq: Once | INTRAMUSCULAR | Status: AC
  Administered 2018-10-22: 10 mg via INTRAVENOUS
  Filled 2018-10-22: qty 2

## 2018-10-22 MED ORDER — SODIUM CHLORIDE 0.9 % IV BOLUS
1000.0000 mL | Freq: Once | INTRAVENOUS | Status: AC
Start: 2018-10-22 — End: 2018-10-22
  Administered 2018-10-22: 1000 mL via INTRAVENOUS

## 2018-10-22 NOTE — ED Provider Notes (Signed)
[facility] EMERGENCY DEPARTMENT Provider Note   CSN: [id number] Arrival date & time: 10/21/18  2154    History   Chief Complaint Chief Complaint  Patient presents with  . Hypertension  . Anxiety    HPI Brad [name] is a 63 y.o. male.     63 y.o male with a PMH of HTN, Anxiety, Depression presents to the ED with a chief complaint of paresthesias and along with headache x today. Patient reports he was at a restaurant earlier when he began feeling paresthesias to the left side of his body beginning with his hands and leg. He also reports having some lower leg pain which he states might be chronic for him. He states getting care for his DDD by Dr. Marc Morgans. Patient also endorses a bad headache which he describes as throbbing from the back of his head to the front of his head. He also endorses some left arm pain but reports he might attribute this to his neuropathy. He reports some epigastric pain which he reports '"feels like my insides are burning". Patient reports taking oxycodone 15 mg daily for his back pain but has not taking any medication for his headache. He denies any fever, chest pain, shortness of breath.       Past Medical History:  Diagnosis Date  . Allergy   . Anxiety   . Depression   . DJD (degenerative joint disease), cervical    postiton with pillow under knees, cant turn neck   . Dysentery, amebic, acute 1981  . GERD (gastroesophageal reflux disease)   . H/O bronchitis   . H/O malaria 1984  . Hearing loss    bilateral   . Hypertension    labile Blood pressure  . Inguinal hernia   . Insomnia    early morning awakening  . MVP (mitral valve prolapse)    "no problems"  . Perianal pain   . Personal history of colonic polyps   . Tinnitus    right ear    Patient Active Problem List   Diagnosis Date Noted  . Paresthesias 06/28/2018  . Right inguinal hernia 04/12/2014  . Inguinal hernia, left-repair [facility] Dec 2012 09/10/2011    Past  Surgical History:  Procedure Laterality Date  . CARPAL TUNNEL RELEASE  10/06, 5/10   right wrist   . CARPAL TUNNEL RELEASE  3/10   left wrist   . cervical spine discectomy   09/2005  . COLONOSCOPY WITH PROPOFOL N/A 10/21/2015   Procedure: COLONOSCOPY WITH PROPOFOL;  Surgeon: Charolett Bumpers, MD;  Location: [facility];  Service: Endoscopy;  Laterality: N/A;  . INGUINAL HERNIA REPAIR  08/16/2011   Procedure: HERNIA REPAIR INGUINAL ADULT;  Surgeon: Valarie Merino, MD;  Location: [facility];  Service: General;  Laterality: Left;  . INGUINAL HERNIA REPAIR Right 05/03/2014   Procedure: OPEN RIGHT INGUINAL HERNIA REPAIR WITH MESH;  Surgeon: Wenda Low, MD;  Location: [facility];  Service: General;  Laterality: Right;  . INSERTION OF MESH Right 05/03/2014   Procedure: INSERTION OF MESH;  Surgeon: Wenda Low, MD;  Location: [facility];  Service: General;  Laterality: Right;  . TONSILLECTOMY  1960        Home Medications    Prior to Admission medications   Medication Sig Start Date End Date Taking? Authorizing Provider  ALPRAZolam Prudy Feeler) 1 MG tablet Take 1 mg by mouth 2 (two) times daily.   Yes [provider]  amLODipine (NORVASC) 5 MG  tablet Take 5 mg by mouth daily.   Yes [provider]  busPIRone (BUSPAR) 10 MG tablet Take 20 mg by mouth 3 (three) times daily.    Yes [provider]  Cholecalciferol (VITAMIN D-3) 5000 UNITS TABS Take 5,000 Units by mouth daily.    Yes [provider]  co-enzyme Q-10 50 MG capsule Take 50 mg by mouth daily.    Yes [provider]  DHEA 25 MG CAPS Take 25 mg by mouth daily.    Yes [provider]  glucosamine-chondroitin 500-400 MG tablet Take 1 tablet by mouth daily.    Yes [provider]  hydrochlorothiazide (HYDRODIURIL) 25 MG tablet Take 12.5 mg by mouth daily.   Yes [provider]  losartan (COZAAR) 100 MG tablet Take 100 mg by mouth daily.  04/28/18  Yes [provider]  Multiple Vitamin (MULTIVITAMIN WITH MINERALS) TABS tablet Take 1 tablet by mouth daily.   Yes [provider]  Omega 3 1000 MG CAPS Take 2,000 mg by mouth daily.    Yes [provider]  triazolam (HALCION) 0.25 MG tablet Take 0.25 mg by mouth at bedtime.  06/23/18  Yes [provider]  zaleplon (SONATA) 10 MG capsule Take 10-20 mg by mouth at bedtime as needed (For early morning awakening.).    Yes [provider]  Zinc 50 MG CAPS Take 50 mg by mouth daily.    Yes [provider]  zolpidem (AMBIEN CR) 12.5 MG CR tablet Take 12.5 mg by mouth at bedtime.    Yes [provider]  gabapentin (NEURONTIN) 300 MG capsule Take 1 capsule (300 mg total) by mouth at bedtime. Patient not taking: Reported on 10/22/2018 10/20/18   Glendale Chard, DO    Family History Family History  Problem Relation Age of Onset  . Cancer Mother        breast  . Intracerebral hemorrhage Father     Social History Social History   Tobacco Use  . Smoking status: Never Smoker  . Smokeless tobacco: Never Used  Substance Use Topics  . Alcohol use: Yes    Comment: 2 wine daily  . Drug use: Yes    Types: Marijuana    Comment: weekend use     Allergies   Patient has no known allergies.   Review of Systems Review of Systems  Constitutional: Negative for chills and fever.  HENT: Negative for ear pain and sore throat.   Eyes: Negative for pain and visual disturbance.  Respiratory: Negative for cough and shortness of breath.   Cardiovascular: Negative for chest pain and palpitations.  Gastrointestinal: Positive for abdominal pain (epigastric). Negative for vomiting.  Genitourinary: Negative for dysuria and hematuria.  Musculoskeletal: Negative for arthralgias and back pain.  Skin: Negative for color change and rash.  Neurological: Positive for headaches. Negative for seizures, syncope and weakness.  All other systems reviewed and are  negative.    Physical Exam Updated Vital Signs BP (!) 162/90   Pulse 78   Temp 98.7 F (37.1 C) (Oral)   Resp 11   Ht 5\' 10"  (1.778 m)   Wt 65.8 kg   SpO2 99%   BMI 20.81 kg/m   Physical Exam Vitals signs and nursing note reviewed.  Constitutional:      Appearance: He is well-developed.  HENT:     Head: Normocephalic and atraumatic.  Eyes:     General: No scleral icterus.    Pupils: Pupils are equal,  round, and reactive to light.  Neck:     Musculoskeletal: Normal range of motion.  Cardiovascular:     Heart sounds: Normal heart sounds.  Pulmonary:     Effort: Pulmonary effort is normal.     Breath sounds: Normal breath sounds. No wheezing.  Chest:     Chest wall: No tenderness.  Abdominal:     General: Bowel sounds are normal. There is no distension.     Palpations: Abdomen is soft.     Tenderness: There is no abdominal tenderness.  Musculoskeletal:        General: No tenderness or deformity.  Skin:    General: Skin is warm and dry.  Neurological:     Mental Status: He is alert and oriented to person, place, and time.     Comments: Alert, oriented, thought content appropriate. Speech fluent without evidence of aphasia. Able to follow 2 step commands without difficulty.  Cranial Nerves:  II:  Peripheral visual fields grossly normal, pupils, round, reactive to light III,IV, VI: ptosis not present, extra-ocular motions intact bilaterally  V,VII: smile symmetric, facial light touch sensation equal VIII: hearing grossly normal bilaterally  IX,X: midline uvula rise  XI: bilateral shoulder shrug equal and strong XII: midline tongue extension  Motor:  5/5 in upper and lower extremities bilaterally including strong and equal grip strength and dorsiflexion/plantar flexion Sensory: light touch normal in all extremities.  Cerebellar: normal finger-to-nose with bilateral upper extremities, pronator drift negative        ED Treatments / Results  Labs (all labs  ordered are listed, but only abnormal results are displayed) Labs Reviewed  BASIC METABOLIC PANEL - Abnormal; Notable for the following components:      Result Value   Sodium 132 (*)    Potassium 3.4 (*)    Chloride 97 (*)    Glucose, Bld 102 (*)    All other components within normal limits  RAPID URINE DRUG SCREEN, HOSP PERFORMED - Abnormal; Notable for the following components:   Benzodiazepines POSITIVE (*)    Tetrahydrocannabinol POSITIVE (*)    All other components within normal limits  CBC  I-STAT TROPONIN, ED    EKG None  Radiology Dg Chest 2 View  Result Date: 10/22/2018 CLINICAL DATA:  63 year old male with chest pain. EXAM: CHEST - 2 VIEW COMPARISON:  Chest radiograph dated 10/05/2018 FINDINGS: There is hyperexpansion of the lungs likely related to underlying air trapping and COPD or asthma. No focal consolidation, pleural effusion, or pneumothorax. Stable cardiac silhouette. No acute osseous pathology. Partially visualized lower cervical ACDF. IMPRESSION: No active cardiopulmonary disease. Electronically Signed   By: Elgie Collard M.D.   On: 10/22/2018 01:45   Ct Head Wo Contrast  Result Date: 10/22/2018 CLINICAL DATA:  Hypertension anxiety EXAM: CT HEAD WITHOUT CONTRAST TECHNIQUE: Contiguous axial images were obtained from the base of the skull through the vertex without intravenous contrast. COMPARISON:  CT 02/27/2008, MRI 02/02/2008 FINDINGS: Brain: No acute territorial infarction, hemorrhage or intracranial mass. Mild atrophy. Stable ventricle size. Vascular: No hyperdense vessels. Scattered calcifications at the carotid siphon Skull: Normal. Negative for fracture or focal lesion. Sinuses/Orbits: Mucosal thickening in the maxillary and ethmoid sinuses Other: None IMPRESSION: No CT evidence for acute intracranial abnormality.  Atrophy Electronically Signed   By: Jasmine Pang M.D.   On: 10/22/2018 01:47    Procedures Procedures (including critical care  time)  Medications Ordered in ED Medications  sodium chloride 0.9 % bolus 1,000 mL (0 mLs Intravenous Stopped 10/22/18  8250)  prochlorperazine (COMPAZINE) injection 10 mg (10 mg Intravenous Given 10/22/18 0215)  diphenhydrAMINE (BENADRYL) injection 25 mg (25 mg Intravenous Given 10/22/18 0215)     Initial Impression / Assessment and Plan / ED Course  I have reviewed the triage vital signs and the nursing notes.  Pertinent labs & imaging results that were available during my care of the patient were reviewed by me and considered in my medical decision making (see chart for details).    Patient presents with concerns for elevated blood pressure and paresthesias x couple of hours. He reports feeling some tingling in hands and feet but unable to tell if its different from his baseline neuropathy. Patient states he is feeling anxious along with a headache.  During evaluation patient's neuro exam is unremarkable. He denies any chest pain or shortness of breath. BMP showed slight decrease in sodium along with potassium, creatinine is within normal limits.  CBC showed no leukocytosis, hemoglobin was within nromal limits. UDS showed positive benzo, THC was also positive.   CT head showed no acute process, no hemorrhage or infarct. DG Chest 2 view showed no acute process. EKG showed no changes consistent with infarct or STEMI.  Troponin was negative, patient received a headache cocktail with improvement in symptoms.  His results have been discussed at length with him, will have him follow-up with PCP.  SPECT that this was due to patient's marijuana intake prior to arrival along with anxiety.  Dates he continues to have pain along the lower legs but this is a baseline for his neuropathy.  Return precautions provided at length for patient.    Final Clinical Impressions(s) / ED Diagnoses   Final diagnoses:  Anxiety  Elevated blood pressure reading    ED Discharge Orders    None       Claude Manges, PA-C 10/22/18 0446    Palumbo, April, MD 10/22/18 [phone]

## 2018-10-22 NOTE — ED Notes (Signed)
Pt complains of dizziness at this time. Rn notified. EKG done

## 2018-10-22 NOTE — ED Notes (Signed)
EKG in chart because the printer won't print them.

## 2018-10-22 NOTE — Discharge Instructions (Addendum)
Your laboratory results were normal today. Your CT Head showed no acute process. Chest xray was clear. Follow up with your primary care physician as needed.

## 2018-10-22 NOTE — ED Notes (Addendum)
Pt c/o dizziness added upon arrival to bed  Increased anxiety

## 2018-10-23 DIAGNOSIS — I10 Essential (primary) hypertension: Secondary | ICD-10-CM | POA: Diagnosis not present

## 2018-10-30 ENCOUNTER — Telehealth: Payer: Self-pay | Admitting: *Deleted

## 2018-10-30 NOTE — Telephone Encounter (Signed)
I spoke with patient and informed him that I will be sending his referral to [facility].  He mentioned that he can't take the gabapentin so he did not pick it up.

## 2018-11-29 ENCOUNTER — Telehealth: Payer: Self-pay | Admitting: Neurology

## 2018-11-29 ENCOUNTER — Ambulatory Visit: Payer: BLUE CROSS/BLUE SHIELD | Admitting: Neurology

## 2018-11-29 NOTE — Telephone Encounter (Signed)
[facility] and they have received the referral.  They will contact patient.

## 2018-11-29 NOTE — Telephone Encounter (Signed)
Patient left a VM last night stating he is waiting on the referral to [facility] that was suppose to be done about 6 weeks ago. He is not happy that we have not done this. He would like a phone call back today

## 2018-12-04 MED FILL — busPIRone HCL 10 MG TABS: 10 | 30 days supply | Qty: 180 | Fill #0

## 2018-12-04 MED FILL — LORazepam 1 MG TABS: 1 | 30 days supply | Qty: 60 | Fill #0

## 2018-12-04 MED FILL — ALPRAZolam 0.5 MG TABS: 0.5 | 30 days supply | Qty: 30 | Fill #0

## 2018-12-04 MED FILL — TRIAZOLAM 0.25 MG TABLET: 0.25 | 30 days supply | Qty: 30 | Fill #0

## 2018-12-06 DIAGNOSIS — F3341 Major depressive disorder, recurrent, in partial remission: Secondary | ICD-10-CM | POA: Diagnosis not present

## 2018-12-06 MED FILL — ZALEPLON 10 MG CAPS: 10 | 30 days supply | Qty: 45 | Fill #0

## 2018-12-06 MED FILL — ZOLPIDEM TART ER 12.5 MG TA: 12.5 | 30 days supply | Qty: 30 | Fill #0

## 2018-12-11 MED FILL — TRIAMCINOLONE 0.1% CREAM: 0.1 | 30 days supply | Qty: 30 | Fill #0

## 2018-12-21 DIAGNOSIS — R208 Other disturbances of skin sensation: Secondary | ICD-10-CM | POA: Diagnosis not present

## 2018-12-21 MED FILL — PREGABALIN 75 MG CAPS: 75 | 30 days supply | Qty: 60 | Fill #0

## 2018-12-26 DIAGNOSIS — G629 Polyneuropathy, unspecified: Secondary | ICD-10-CM | POA: Diagnosis not present

## 2018-12-26 DIAGNOSIS — F419 Anxiety disorder, unspecified: Secondary | ICD-10-CM | POA: Diagnosis not present

## 2018-12-26 DIAGNOSIS — R7303 Prediabetes: Secondary | ICD-10-CM | POA: Diagnosis not present

## 2018-12-30 MED FILL — busPIRone HCL 10 MG TABS: 10 | 30 days supply | Qty: 180 | Fill #0

## 2018-12-30 MED FILL — METOPROLOL SUCCINATE ER 25: 25 | 30 days supply | Qty: 60 | Fill #0

## 2018-12-30 MED FILL — ZALEPLON 10 MG CAPS: 10 | 30 days supply | Qty: 45 | Fill #1

## 2019-01-05 MED FILL — oxyCODONE HCL 15 MG TABS: 15 | 14 days supply | Qty: 56 | Fill #0

## 2019-01-08 DIAGNOSIS — M79671 Pain in right foot: Secondary | ICD-10-CM | POA: Diagnosis not present

## 2019-01-08 DIAGNOSIS — R202 Paresthesia of skin: Secondary | ICD-10-CM | POA: Diagnosis not present

## 2019-01-08 DIAGNOSIS — Z0000 Encounter for general adult medical examination without abnormal findings: Secondary | ICD-10-CM | POA: Diagnosis not present

## 2019-01-08 DIAGNOSIS — R351 Nocturia: Secondary | ICD-10-CM | POA: Diagnosis not present

## 2019-01-08 DIAGNOSIS — M25552 Pain in left hip: Secondary | ICD-10-CM | POA: Diagnosis not present

## 2019-01-08 DIAGNOSIS — I10 Essential (primary) hypertension: Secondary | ICD-10-CM | POA: Diagnosis not present

## 2019-01-08 DIAGNOSIS — Z125 Encounter for screening for malignant neoplasm of prostate: Secondary | ICD-10-CM | POA: Diagnosis not present

## 2019-01-08 DIAGNOSIS — K4090 Unilateral inguinal hernia, without obstruction or gangrene, not specified as recurrent: Secondary | ICD-10-CM | POA: Diagnosis not present

## 2019-01-08 DIAGNOSIS — Z13220 Encounter for screening for lipoid disorders: Secondary | ICD-10-CM | POA: Diagnosis not present

## 2019-01-08 DIAGNOSIS — F329 Major depressive disorder, single episode, unspecified: Secondary | ICD-10-CM | POA: Diagnosis not present

## 2019-01-08 MED FILL — HYDROCHLOROTHIAZIDE 12.5 MG: 12.5 | 90 days supply | Qty: 90 | Fill #0

## 2019-01-08 MED FILL — LOSARTAN POTASSIUM 100 MG T: 100 | 30 days supply | Qty: 30 | Fill #0

## 2019-01-15 DIAGNOSIS — M79671 Pain in right foot: Secondary | ICD-10-CM | POA: Diagnosis not present

## 2019-01-15 DIAGNOSIS — M25571 Pain in right ankle and joints of right foot: Secondary | ICD-10-CM | POA: Diagnosis not present

## 2019-01-18 DIAGNOSIS — I10 Essential (primary) hypertension: Secondary | ICD-10-CM | POA: Diagnosis not present

## 2019-01-18 DIAGNOSIS — M5136 Other intervertebral disc degeneration, lumbar region: Secondary | ICD-10-CM | POA: Diagnosis not present

## 2019-01-18 DIAGNOSIS — R1032 Left lower quadrant pain: Secondary | ICD-10-CM | POA: Diagnosis not present

## 2019-01-18 DIAGNOSIS — M47816 Spondylosis without myelopathy or radiculopathy, lumbar region: Secondary | ICD-10-CM | POA: Diagnosis not present

## 2019-01-23 ENCOUNTER — Emergency Department (HOSPITAL_COMMUNITY)
Admission: EM | Admit: 2019-01-23 | Discharge: 2019-01-23 | Disposition: A | Discharge status: Home / Self Care | Payer: BLUE CROSS/BLUE SHIELD | Attending: Emergency Medicine | Admitting: Emergency Medicine

## 2019-01-23 ENCOUNTER — Emergency Department (HOSPITAL_COMMUNITY)
Admission: EM | Admit: 2019-01-23 | Discharge: 2019-01-23 | Disposition: A | Discharge status: Home / Self Care | Payer: BLUE CROSS/BLUE SHIELD | Source: Home / Self Care | Attending: Emergency Medicine | Admitting: Emergency Medicine

## 2019-01-23 ENCOUNTER — Other Ambulatory Visit: Payer: Self-pay

## 2019-01-23 ENCOUNTER — Encounter (HOSPITAL_COMMUNITY): Payer: Self-pay | Admitting: Emergency Medicine

## 2019-01-23 DIAGNOSIS — R198 Other specified symptoms and signs involving the digestive system and abdomen: Secondary | ICD-10-CM

## 2019-01-23 DIAGNOSIS — I10 Essential (primary) hypertension: Secondary | ICD-10-CM | POA: Insufficient documentation

## 2019-01-23 DIAGNOSIS — R457 State of emotional shock and stress, unspecified: Secondary | ICD-10-CM | POA: Diagnosis not present

## 2019-01-23 DIAGNOSIS — Z79899 Other long term (current) drug therapy: Secondary | ICD-10-CM | POA: Insufficient documentation

## 2019-01-23 DIAGNOSIS — F419 Anxiety disorder, unspecified: Secondary | ICD-10-CM | POA: Insufficient documentation

## 2019-01-23 DIAGNOSIS — F411 Generalized anxiety disorder: Secondary | ICD-10-CM | POA: Insufficient documentation

## 2019-01-23 DIAGNOSIS — R0689 Other abnormalities of breathing: Secondary | ICD-10-CM | POA: Diagnosis not present

## 2019-01-23 DIAGNOSIS — R0602 Shortness of breath: Secondary | ICD-10-CM | POA: Diagnosis not present

## 2019-01-23 DIAGNOSIS — R0989 Other specified symptoms and signs involving the circulatory and respiratory systems: Secondary | ICD-10-CM | POA: Insufficient documentation

## 2019-01-23 DIAGNOSIS — F410 Panic disorder [episodic paroxysmal anxiety] without agoraphobia: Secondary | ICD-10-CM

## 2019-01-23 DIAGNOSIS — F458 Other somatoform disorders: Secondary | ICD-10-CM | POA: Diagnosis not present

## 2019-01-23 DIAGNOSIS — F329 Major depressive disorder, single episode, unspecified: Secondary | ICD-10-CM | POA: Insufficient documentation

## 2019-01-23 LAB — CBC WITH DIFFERENTIAL/PLATELET
Abs Immature Granulocytes: 0.02 10*3/uL (ref 0.00–0.07)
Basophils Absolute: 0 10*3/uL (ref 0.0–0.1)
Basophils Relative: 0 %
Eosinophils Absolute: 0 10*3/uL (ref 0.0–0.5)
Eosinophils Relative: 0 %
HCT: 36.6 % — ABNORMAL LOW (ref 39.0–52.0)
Hemoglobin: 12.6 g/dL — ABNORMAL LOW (ref 13.0–17.0)
Immature Granulocytes: 0 %
Lymphocytes Relative: 18 %
Lymphs Abs: 1.3 10*3/uL (ref 0.7–4.0)
MCH: 31.1 pg (ref 26.0–34.0)
MCHC: 34.4 g/dL (ref 30.0–36.0)
MCV: 90.4 fL (ref 80.0–100.0)
Monocytes Absolute: 0.5 10*3/uL (ref 0.1–1.0)
Monocytes Relative: 7 %
Neutro Abs: 5.3 10*3/uL (ref 1.7–7.7)
Neutrophils Relative %: 75 %
Platelets: 191 10*3/uL (ref 150–400)
RBC: 4.05 MIL/uL — ABNORMAL LOW (ref 4.22–5.81)
RDW: 12.3 % (ref 11.5–15.5)
WBC: 7.1 10*3/uL (ref 4.0–10.5)
nRBC: 0 % (ref 0.0–0.2)

## 2019-01-23 LAB — COMPREHENSIVE METABOLIC PANEL
ALT: 20 U/L (ref 0–44)
AST: 32 U/L (ref 15–41)
Albumin: 3.9 g/dL (ref 3.5–5.0)
Alkaline Phosphatase: 81 U/L (ref 38–126)
Anion gap: 9 (ref 5–15)
BUN: 19 mg/dL (ref 8–23)
CO2: 24 mmol/L (ref 22–32)
Calcium: 9.6 mg/dL (ref 8.9–10.3)
Chloride: 97 mmol/L — ABNORMAL LOW (ref 98–111)
Creatinine, Ser: 0.87 mg/dL (ref 0.61–1.24)
GFR calc Af Amer: >60 mL/min (ref >60)
GFR calc non Af Amer: >60 mL/min (ref >60)
Glucose, Bld: 140 mg/dL — ABNORMAL HIGH (ref 70–99)
Potassium: 3.7 mmol/L (ref 3.5–5.1)
Sodium: 130 mmol/L — ABNORMAL LOW (ref 135–145)
Total Bilirubin: 1 mg/dL (ref 0.3–1.2)
Total Protein: 6.1 g/dL — ABNORMAL LOW (ref 6.5–8.1)

## 2019-01-23 LAB — URINALYSIS, ROUTINE W REFLEX MICROSCOPIC
Bilirubin Urine: NEGATIVE
Glucose, UA: NEGATIVE mg/dL
Hgb urine dipstick: NEGATIVE
Ketones, ur: 5 mg/dL — AB
Leukocytes,Ua: NEGATIVE
Nitrite: NEGATIVE
Protein, ur: NEGATIVE mg/dL
Specific Gravity, Urine: 1.015 (ref 1.005–1.030)
pH: 6 (ref 5.0–8.0)

## 2019-01-23 LAB — RAPID URINE DRUG SCREEN, HOSP PERFORMED
Amphetamines: NOT DETECTED
Barbiturates: NOT DETECTED
Benzodiazepines: POSITIVE — AB
Cocaine: NOT DETECTED
Opiates: NOT DETECTED
Tetrahydrocannabinol: NOT DETECTED

## 2019-01-23 LAB — ACETAMINOPHEN LEVEL: Acetaminophen (Tylenol), Serum: <10 ug/mL — ABNORMAL LOW (ref 10–30)

## 2019-01-23 LAB — SALICYLATE LEVEL: Salicylate Lvl: <7 mg/dL (ref 2.8–30.0)

## 2019-01-23 LAB — ETHANOL: Alcohol, Ethyl (B): <10 mg/dL (ref <10)

## 2019-01-23 MED ORDER — GABAPENTIN 300 MG PO CAPS: 300.0000 mg | ORAL_CAPSULE | Freq: Every day | ORAL | Status: DC

## 2019-01-23 MED ORDER — ALPRAZOLAM 0.25 MG PO TABS
1.0000 mg | ORAL_TABLET | Freq: Two times a day (BID) | ORAL | Status: DC
  Administered 2019-01-23: 1 mg via ORAL
  Filled 2019-01-23: qty 4

## 2019-01-23 MED ORDER — HYDROCHLOROTHIAZIDE 25 MG PO TABS
12.5000 mg | ORAL_TABLET | Freq: Every day | ORAL | Status: DC
  Administered 2019-01-23: 12.5 mg via ORAL
  Filled 2019-01-23: qty 1

## 2019-01-23 MED ORDER — AMLODIPINE BESYLATE 5 MG PO TABS
5.0000 mg | ORAL_TABLET | Freq: Every day | ORAL | Status: DC
  Administered 2019-01-23: 5 mg via ORAL
  Filled 2019-01-23: qty 1

## 2019-01-23 MED ORDER — BUSPIRONE HCL 10 MG PO TABS
20.0000 mg | ORAL_TABLET | Freq: Three times a day (TID) | ORAL | Status: DC
  Administered 2019-01-23: 20 mg via ORAL
  Filled 2019-01-23: qty 2

## 2019-01-23 NOTE — BH Assessment (Signed)
Tele Assessment Note   Patient Name: Brad [name] MRN: [id number] Referring Physician: Ines Bloomer joy Location of Patient: [facility] Location of Provider: Behavioral Health TTS Department  Brad [name] is an 63 y.o. male who has presented twice in the [facility] this date due to his anxiety issues.  Patient initially presented to the ED because he thought that he had aspirated three Ibuprofen tablets.  Patient was cleared medically and returned home.  Patient states that when he returned home that he was feeling really "anxious, agitated and uptight so I went for a walk."  He states that his wife and a neighbor tracked him down and brought him back to the ED.  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 Patient states that he has bills piling up. Patient states that he also has multiple physical ailments, he mentions a hernia, that has been causing him some discomfort.  Patient states that he is only sleeping 3-5 hours per night and he is keeping his wife up and he feels guilty because he is keeping her from sleeping.  Patient states that he was seeing a therapist, Dayton Scrape at [facility], but her retired and patient states that he has not sought out a new therapist.  He states that when he was in therapy that it was helpful in controlling his anxiety.  Patient states that he has been seeing Dr. Donell Beers for six to seven years and he has been treating his anxiety.  Patient states that he takes Ativan (Lorazepam), Buspirone (Buspar) for his anxiety and Ambien for sleep disturbance.  Patient states that until six months ago that he was taking 2 mg of Lorazepam po tid, but has since tapered to 1 mg po tid.  He states that Dr. Donell Beers has prescribed him Alprazolam (Xanax) 1-2 mg po tid in conjunction with  the Lorazepam in order to try to help him manage his anxiety.  He states that he is also taking Buspirone 20 mg po tid.  Patient states that he does not feel like his medications are working effectively and agrees that he may have built tolerance to the medication.  It was addressed as a concern that he could be going through some withdrawal in between dosages.  Possible withdrawal symptoms combined with social stressors and poor quality sleep could definitely heighten his anxiety.  Patient states that he does not feel like he is depressed clinically, but situationally he is struggling.  Patient states that he is not suicidal/homicidal and denies any previous or current thoughts, plan or intent.  Patient states that he has never experienced any psychotic symptoms.  Patient states that he does have an occasional glass of wine with his meal and smokes marijuana on occasion.    Patient presents as being alert and oriented.  His memory is intact and his thoughts are organized. His judgment, insight and impulse control are partially impaired and it appears that he has poor coping mechanisms to manage his everyday stressors.  Patient does not appear to be responding to any internal stimuli.  His eye contact is good and his speech clear and coherent.  He was moderately to severely anxious during his assessment.  Patient did not feel like he needed Psychiatric hospitalization.  He stated that he felt  safe to go home and that he was able to contract for safety.  His wife Brad [name]) was present in the room and felt like patient could be safe to return home.  Patient and his wife agreed that patient needed to get back into therapy.  They also stated that patient needed to contact Dr. Donell Beers to address his medication issues.  Patient and his wife both agreed to return to the ED with any further psychiatric decompensation.  Diagnosis: F41.1 Generalized anxiety disorder  Past Medical History:   Past Medical History:  Diagnosis Date  . Allergy   . Anxiety   . Depression   . DJD (degenerative joint disease), cervical    postiton with pillow under knees, cant turn neck   . Dysentery, amebic, acute 1981  . GERD (gastroesophageal reflux disease)   . H/O bronchitis   . H/O malaria 1984  . Hearing loss    bilateral   . Hypertension    labile Blood pressure  . Inguinal hernia   . Insomnia    early morning awakening  . MVP (mitral valve prolapse)    "no problems"  . Perianal pain   . Personal history of colonic polyps   . Tinnitus    right ear    Past Surgical History:  Procedure Laterality Date  . CARPAL TUNNEL RELEASE  10/06, 5/10   right wrist   . CARPAL TUNNEL RELEASE  3/10   left wrist   . cervical spine discectomy   09/2005  . COLONOSCOPY WITH PROPOFOL N/A 10/21/2015   Procedure: COLONOSCOPY WITH PROPOFOL;  Surgeon: Charolett Bumpers, MD;  Location: [facility];  Service: Endoscopy;  Laterality: N/A;  . INGUINAL HERNIA REPAIR  08/16/2011   Procedure: HERNIA REPAIR INGUINAL ADULT;  Surgeon: Valarie Merino, MD;  Location: [facility];  Service: General;  Laterality: Left;  . INGUINAL HERNIA REPAIR Right 05/03/2014   Procedure: OPEN RIGHT INGUINAL HERNIA REPAIR WITH MESH;  Surgeon: Wenda Low, MD;  Location: [facility];  Service: General;  Laterality: Right;  . INSERTION OF MESH Right 05/03/2014   Procedure: INSERTION OF MESH;  Surgeon: Wenda Low, MD;  Location: [facility];  Service: General;  Laterality: Right;  . TONSILLECTOMY  1960    Family History:  Family History  Problem Relation Age of Onset  . Cancer Mother        breast  . Intracerebral hemorrhage Father     Social History:  reports that he has never smoked. He has never used smokeless tobacco. He reports current alcohol use. He reports current drug use. Drug: Marijuana.  Additional Social History:  Alcohol / Drug Use Pain Medications: see MAR Prescriptions: see MAR Over the Counter: see  MAR History of alcohol / drug use?: Yes Longest period of sobriety (when/how long): none reported Substance #1 Name of Substance 1: marijuana 1 - Age of First Use: unable to assess 1 - Amount (size/oz): occasional joint 1 - Frequency: occasional 1 - Duration: unknown 1 - Last Use / Amount: unable to assess  CIWA: CIWA-Ar BP: 102/70 Pulse Rate: 65 COWS:    Allergies:  Allergies  Allergen Reactions  . Gabapentin     Home Medications: (Not in a hospital admission)   OB/GYN Status:  No LMP for male patient.  General Assessment Data Location of Assessment: [facility] ED TTS Assessment: In system Is this a Tele or Face-to-Face Assessment?: Tele Assessment Is this an Initial Assessment or a Re-assessment for this encounter?: Initial  Assessment Patient Accompanied by:: Other(Spouse) Language Other than English: No Living Arrangements: Other (Comment)(has home with spouse) What gender do you identify as?: Male Marital status: Married Living Arrangements: Spouse/significant other Can pt return to current living arrangement?: Yes Admission Status: Voluntary Is patient capable of signing voluntary admission?: Yes Referral Source: Self/Family/Friend Insurance type: [employer]     Crisis Care Plan Living Arrangements: Spouse/significant other Legal Guardian: Other:(self) Name of Psychiatrist: Plovsky Name of Therapist: none  Education Status Is patient currently in school?: No Is the patient employed, unemployed or receiving disability?: Unemployed  Risk to self with the past 6 months Suicidal Ideation: No Has patient been a risk to self within the past 6 months prior to admission? : No Suicidal Intent: No Has patient had any suicidal intent within the past 6 months prior to admission? : No Is patient at risk for suicide?: No Suicidal Plan?: No Has patient had any suicidal plan within the past 6 months prior to admission? : No Access to Means: No What has been your use of  drugs/alcohol within the last 12 months?: (occasional THC) Previous Attempts/Gestures: No How many times?: 0 Other Self Harm Risks: none Triggers for Past Attempts: None known Intentional Self Injurious Behavior: None Family Suicide History: No Recent stressful life event(s): Financial Problems Persecutory voices/beliefs?: No Depression: No Depression Symptoms: Insomnia Substance abuse history and/or treatment for substance abuse?: Yes Suicide prevention information given to non-admitted patients: Not applicable  Risk to Others within the past 6 months Homicidal Ideation: No Does patient have any lifetime risk of violence toward others beyond the six months prior to admission? : No Thoughts of Harm to Others: No Current Homicidal Intent: No Current Homicidal Plan: No Access to Homicidal Means: No Identified Victim: none History of harm to others?: No Assessment of Violence: None Noted Violent Behavior Description: none Does patient have access to weapons?: No Criminal Charges Pending?: No Does patient have a court date: No Is patient on probation?: No  Psychosis Hallucinations: None noted Delusions: None noted  Mental Status Report Appearance/Hygiene: Unremarkable Eye Contact: Good Motor Activity: Unremarkable Speech: Logical/coherent Level of Consciousness: Alert Mood: Anxious Affect: Anxious Anxiety Level: Severe Thought Processes: Coherent, Relevant Judgement: Partial Orientation: Person, Place, Time, Situation Obsessive Compulsive Thoughts/Behaviors: Severe  Cognitive Functioning Concentration: Decreased Memory: Recent Intact, Remote Intact Is patient IDD: No Insight: Fair Impulse Control: Poor Appetite: Good Have you had any weight changes? : No Change Sleep: Decreased Total Hours of Sleep: 3 Vegetative Symptoms: None  ADLScreening [facility] Assessment Services) Patient's cognitive ability adequate to safely complete daily activities?: Yes Patient able to  express need for assistance with ADLs?: Yes Independently performs ADLs?: Yes (appropriate for developmental age)  Prior Inpatient Therapy Prior Inpatient Therapy: No  Prior Outpatient Therapy Prior Outpatient Therapy: Yes Prior Therapy Dates: active Prior Therapy Facilty/Provider(s): Dr. Donell Beers Reason for Treatment: anxiety Does patient have an ACCT team?: No Does patient have Intensive In-House Services?  : No Does patient have [facility] services? : No Does patient have P4CC services?: No  ADL Screening (condition at time of admission) Patient's cognitive ability adequate to safely complete daily activities?: Yes Is the patient deaf or have difficulty hearing?: No Does the patient have difficulty seeing, even when wearing glasses/contacts?: No Does the patient have difficulty concentrating, remembering, or making decisions?: No Patient able to express need for assistance with ADLs?: Yes Does the patient have difficulty dressing or bathing?: No Independently performs ADLs?: Yes (appropriate for developmental age) Does the patient have difficulty  walking or climbing stairs?: No Weakness of Legs: None Weakness of Arms/Hands: None  Home Assistive Devices/Equipment Home Assistive Devices/Equipment: None  Therapy Consults (therapy consults require a physician order) PT Evaluation Needed: No OT Evalulation Needed: No SLP Evaluation Needed: No Abuse/Neglect Assessment (Assessment to be complete while patient is alone) Abuse/Neglect Assessment Can Be Completed: Yes Physical Abuse: Denies Verbal Abuse: Denies Sexual Abuse: Denies Exploitation of patient/patient's resources: Denies Self-Neglect: Denies Values / Beliefs Cultural Requests During Hospitalization: None Spiritual Requests During Hospitalization: None Consults Spiritual Care Consult Needed: No Social Work Consult Needed: No Merchant navy officerAdvance Directives (For Healthcare) Does Patient Have a Medical Advance Directive?: No Would  patient like information on creating a medical advance directive?: No - Patient declined Nutrition Screen- [facility] Has the patient recently lost weight without trying?: No Has the patient been eating poorly because of a decreased appetite?: No Malnutrition Screening Tool Score: 0        Disposition: Per Denzil MagnusonLaShunda Thomas, NP, patient does not meet inpatient admission criteria and can be discharged from the ED to follow-up with Dr. Donell BeersPlovsky Disposition Initial Assessment Completed for this Encounter: Yes Patient referred to: (follow up with Dr. Donell BeersPlovsky)  This service was provided via telemedicine using a 2-way, interactive audio and video technology.  Names of all persons participating in this telemedicine service and their role in this encounter. Name: Theotis BarrioJohn Placencia Role: patient  Name: Brad RavelSoares Do Nascimento,Marilucia Role: patient's wife  Name: Edee Nifong Role: TTS  Name:  Role:     Daphene CalamityDanny J Vangie Henthorn 01/23/2019 1:45 PM

## 2019-01-23 NOTE — Discharge Instructions (Signed)
Please follow up with your primary care provider for any further management of this issue.  Return to the ED for shortness of breath, chest pain, choking, or any other major concerns.

## 2019-01-23 NOTE — ED Notes (Signed)
This RN acting as nurse navigator and rounded on pt to see if I need to contact any family. Pt's family present in room and well informed.

## 2019-01-23 NOTE — ED Triage Notes (Signed)
Neighbor and wife with pt. . Pt was just D/C'ed this morning at 802. Wife and neighbor had to find him. Brought hime back to ED. Pt will not cooperate and the wife and neighbor are trying to hole him down from just  walking away.. Pt will not be still and always wants to leave. The wife wants him to be seen.

## 2019-01-23 NOTE — ED Provider Notes (Signed)
[facility] EMERGENCY DEPARTMENT Provider Note   CSN: [id number] Arrival date & time: 01/23/19  1009    History   Chief Complaint Chief Complaint  Patient presents with  . Anxiety  . Psychiatric Evaluation    HPI Brad [name] is a 63 y.o. male.     The history is provided by the patient.  Anxiety  This is a new problem. The current episode started more than 2 days ago. The problem occurs constantly. The problem has been gradually worsening. Nothing relieves the symptoms. He has tried nothing for the symptoms.   Pt reports he has been concerned about COVID.  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  Patient reports he has a history of anxiety and insomnia he is on multiple medications.  He is followed by [facility] Kath[facility].  Patient's wife reports patient has been pacing and anxious.  Patient has been obsessing about his pants.Pt reports he stunk his pants and they are very uncomfortable.  Pt is aware that he is fixating on things.   Pt reports he has been unable to sleep.  He takes multiple medications for sleep, Pt was here earlier and left.  Pt is wanting to leave at the time of evaluation.   Past Medical History:  Diagnosis Date  . Allergy   . Anxiety   . Depression   . DJD (degenerative joint disease), cervical    postiton with pillow under knees, cant turn neck   . Dysentery, amebic, acute 1981  . GERD (gastroesophageal reflux disease)   . H/O bronchitis   . H/O malaria 1984  . Hearing loss    bilateral   . Hypertension    labile Blood pressure  . Inguinal hernia   . Insomnia    early morning awakening  . MVP (mitral valve prolapse)    "no problems"  . Perianal pain   . Personal history of colonic polyps   . Tinnitus    right ear    Patient Active Problem List   Diagnosis Date Noted  . Paresthesias 06/28/2018  . Right inguinal hernia 04/12/2014  . Inguinal hernia,  left-repair [facility] Dec 2012 09/10/2011    Past Surgical History:  Procedure Laterality Date  . CARPAL TUNNEL RELEASE  10/06, 5/10   right wrist   . CARPAL TUNNEL RELEASE  3/10   left wrist   . cervical spine discectomy   09/2005  . COLONOSCOPY WITH PROPOFOL N/A 10/21/2015   Procedure: COLONOSCOPY WITH PROPOFOL;  Surgeon: Charolett Bumpers, MD;  Location: [facility];  Service: Endoscopy;  Laterality: N/A;  . INGUINAL HERNIA REPAIR  08/16/2011   Procedure: HERNIA REPAIR INGUINAL ADULT;  Surgeon: Valarie Merino, MD;  Location: [facility];  Service: General;  Laterality: Left;  . INGUINAL HERNIA REPAIR Right 05/03/2014   Procedure: OPEN RIGHT INGUINAL HERNIA REPAIR WITH MESH;  Surgeon: Wenda Low, MD;  Location: [facility];  Service: General;  Laterality: Right;  . INSERTION OF MESH Right 05/03/2014   Procedure: INSERTION OF MESH;  Surgeon: Wenda Low, MD;  Location: [facility];  Service: General;  Laterality: Right;  . TONSILLECTOMY  1960        Home Medications    Prior to Admission medications   Medication Sig Start Date End Date Taking? Authorizing Provider  ALPRAZolam Prudy Feeler) 1 MG tablet Take 1 mg by mouth 2 (two) times daily.    [provider]  amLODipine (NORVASC) 5 MG tablet Take 5 mg by mouth daily.    [provider]  busPIRone (BUSPAR) 10 MG tablet Take 20 mg by mouth 3 (three) times daily.     [provider]  Cholecalciferol (VITAMIN D-3) 5000 UNITS TABS Take 5,000 Units by mouth daily.     [provider]  co-enzyme Q-10 50 MG capsule Take 50 mg by mouth daily.     [provider]  DHEA 25 MG CAPS Take 25 mg by mouth daily.     [provider]  gabapentin (NEURONTIN) 300 MG capsule Take 1 capsule (300 mg total) by mouth at bedtime. Patient not taking: Reported on 10/22/2018 10/20/18   Nita SicklePatel, Donika K, DO  glucosamine-chondroitin 500-400 MG tablet Take 1 tablet by mouth daily.     [provider]   hydrochlorothiazide (HYDRODIURIL) 25 MG tablet Take 12.5 mg by mouth daily.    [provider]  losartan (COZAAR) 100 MG tablet Take 100 mg by mouth daily.  04/28/18   [provider]  Multiple Vitamin (MULTIVITAMIN WITH MINERALS) TABS tablet Take 1 tablet by mouth daily.    [provider]  Omega 3 1000 MG CAPS Take 2,000 mg by mouth daily.     [provider]  triazolam (HALCION) 0.25 MG tablet Take 0.25 mg by mouth at bedtime.  06/23/18   [provider]  zaleplon (SONATA) 10 MG capsule Take 10-20 mg by mouth at bedtime as needed (For early morning awakening.).     [provider]  Zinc 50 MG CAPS Take 50 mg by mouth daily.     [provider]  zolpidem (AMBIEN CR) 12.5 MG CR tablet Take 12.5 mg by mouth at bedtime.     [provider]    Family History Family History  Problem Relation Age of Onset  . Cancer Mother        breast  . Intracerebral hemorrhage Father     Social History Social History   Tobacco Use  . Smoking status: Never Smoker  . Smokeless tobacco: Never Used  Substance Use Topics  . Alcohol use: Yes    Comment: 2 wine daily  . Drug use: Yes    Types: Marijuana    Comment: weekend use     Allergies   Gabapentin   Review of Systems Review of Systems  Psychiatric/Behavioral: Negative for hallucinations and suicidal ideas. The patient is nervous/anxious.   All other systems reviewed and are negative.    Physical Exam Updated Vital Signs BP 102/70   Pulse 65   Temp 98.6 F (37 C) (Oral)   Resp 18   SpO2 98%   Physical Exam Vitals signs and nursing note reviewed.  Constitutional:      Appearance: He is well-developed.  HENT:     Head: Normocephalic.     Nose: Nose normal.     Mouth/Throat:     Mouth: Mucous membranes are moist.  Eyes:     Pupils: Pupils are equal, round, and reactive to light.  Neck:     Musculoskeletal: Normal range of motion.  Cardiovascular:      Rate and Rhythm: Normal rate.  Pulmonary:     Effort: Pulmonary effort is normal.  Abdominal:     General: Abdomen is flat. There is no distension.  Musculoskeletal: Normal range of motion.  Skin:    General: Skin is warm.  Neurological:     Mental Status: He is  alert and oriented to person, place, and time.  Psychiatric:        Mood and Affect: Mood normal.      ED Treatments / Results  Labs (all labs ordered are listed, but only abnormal results are displayed) Labs Reviewed  COMPREHENSIVE METABOLIC PANEL - Abnormal; Notable for the following components:      Result Value   Sodium 130 (*)    Chloride 97 (*)    Glucose, Bld 140 (*)    Total Protein 6.1 (*)    All other components within normal limits  RAPID URINE DRUG SCREEN, HOSP PERFORMED - Abnormal; Notable for the following components:   Benzodiazepines POSITIVE (*)    All other components within normal limits  CBC WITH DIFFERENTIAL/PLATELET - Abnormal; Notable for the following components:   RBC 4.05 (*)    Hemoglobin 12.6 (*)    HCT 36.6 (*)    All other components within normal limits  URINALYSIS, ROUTINE W REFLEX MICROSCOPIC - Abnormal; Notable for the following components:   Ketones, ur 5 (*)    All other components within normal limits  ACETAMINOPHEN LEVEL - Abnormal; Notable for the following components:   Acetaminophen (Tylenol), Serum <10 (*)    All other components within normal limits  ETHANOL  SALICYLATE LEVEL    EKG None  Radiology No results found.  Procedures Procedures (including critical care time)  Medications Ordered in ED Medications  ALPRAZolam (XANAX) tablet 1 mg (1 mg Oral Given 01/23/19 1218)  amLODipine (NORVASC) tablet 5 mg (5 mg Oral Given 01/23/19 1228)  busPIRone (BUSPAR) tablet 20 mg (20 mg Oral Not Given 01/23/19 1508)  gabapentin (NEURONTIN) capsule 300 mg (has no administration in time range)  hydrochlorothiazide (HYDRODIURIL) tablet 12.5 mg (12.5 mg Oral Given 01/23/19  1218)     Initial Impression / Assessment and Plan / ED Course  I have reviewed the triage vital signs and the nursing notes.  Pertinent labs & imaging results that were available during my care of the patient were reviewed by me and considered in my medical decision making (see chart for details).        Pt agreed to TTS consult.  TTS advise pt does not meet criteria for IVC.   Counselor advised pt has not had medications and advised to give medications.  On reevaluation approx 45 minutes later.  Pt is calm. Pt agreed to blood work.   Pt advised to call his psychiatrist to discuss his medications and for referral to a new counselor.   Pt observed.  Pt seems much improved.   Pt advised to return if any further problems.   Final Clinical Impressions(s) / ED Diagnoses   Final diagnoses:  Anxiety    ED Discharge Orders    None    An After Visit Summary was printed and given to the patient.    Osie Cheeks 01/23/19 1559    Gerhard Munch, MD 01/24/19 [id number]

## 2019-01-23 NOTE — ED Notes (Signed)
Patient verbalizes understanding of discharge instructions. Opportunity for questioning and answering were provided.patient discharged from ED.  Wife at bedside and is taking patient home

## 2019-01-23 NOTE — ED Triage Notes (Signed)
Pt brought in by ems for c/o "aspirating on 3 baby asa a couple of days ago ; pt takes multiple meds for anxiety and states they havent been working these last couple of days ; pt alert and oriented x 4 and is super anxious ; pt unable to stay still for vital signs and states " I cannot , im so anxious"

## 2019-01-23 NOTE — ED Notes (Signed)
Sophia ,PA aware that ekg was never performed on patient ; ok to discharge per sophia ,PA

## 2019-01-23 NOTE — ED Notes (Signed)
Patient refusing meds , ekg , and blood work; Dr. Jeraldine Loots aware and at bedside

## 2019-01-23 NOTE — ED Provider Notes (Signed)
[facility] EMERGENCY DEPARTMENT Provider Note   CSN: [id number] Arrival date & time: 01/23/19  4098    History   Chief Complaint Chief Complaint  Patient presents with  . anxiety  . Aspiration    HPI Brad [name] is a 63 y.o. male.     HPI   Brad [name] is a 63 y.o. male, with a history of anxiety, GERD, HTN, insomnia, presenting to the ED with a concern for aspiration.  He states last night he "threw back" three Advils by tossing them into his mouth.  This caused him to gag.  He is concerned he may have breathed them into his lungs.  He initially had a sensation of shortness of breath.  He became more more anxious that he breathed then these pills until he "just could not take it anymore."  He states, "I just want somebody to look me over."  Denies current shortness of breath, cough, chest pain, N/V, abdominal pain, throat pain, or any other complaints.       Past Medical History:  Diagnosis Date  . Allergy   . Anxiety   . Depression   . DJD (degenerative joint disease), cervical    postiton with pillow under knees, cant turn neck   . Dysentery, amebic, acute 1981  . GERD (gastroesophageal reflux disease)   . H/O bronchitis   . H/O malaria 1984  . Hearing loss    bilateral   . Hypertension    labile Blood pressure  . Inguinal hernia   . Insomnia    early morning awakening  . MVP (mitral valve prolapse)    "no problems"  . Perianal pain   . Personal history of colonic polyps   . Tinnitus    right ear    Patient Active Problem List   Diagnosis Date Noted  . Paresthesias 06/28/2018  . Right inguinal hernia 04/12/2014  . Inguinal hernia, left-repair [facility] Dec 2012 09/10/2011    Past Surgical History:  Procedure Laterality Date  . CARPAL TUNNEL RELEASE  10/06, 5/10   right wrist   . CARPAL TUNNEL RELEASE  3/10   left wrist   . cervical spine discectomy   09/2005  . COLONOSCOPY WITH PROPOFOL N/A 10/21/2015   Procedure: COLONOSCOPY WITH  PROPOFOL;  Surgeon: Charolett Bumpers, MD;  Location: [facility];  Service: Endoscopy;  Laterality: N/A;  . INGUINAL HERNIA REPAIR  08/16/2011   Procedure: HERNIA REPAIR INGUINAL ADULT;  Surgeon: Valarie Merino, MD;  Location: [facility];  Service: General;  Laterality: Left;  . INGUINAL HERNIA REPAIR Right 05/03/2014   Procedure: OPEN RIGHT INGUINAL HERNIA REPAIR WITH MESH;  Surgeon: Wenda Low, MD;  Location: [facility];  Service: General;  Laterality: Right;  . INSERTION OF MESH Right 05/03/2014   Procedure: INSERTION OF MESH;  Surgeon: Wenda Low, MD;  Location: [facility];  Service: General;  Laterality: Right;  . TONSILLECTOMY  1960        Home Medications    Prior to Admission medications   Medication Sig Start Date End Date Taking? Authorizing Provider  ALPRAZolam Prudy Feeler) 1 MG tablet Take 1 mg by mouth 2 (two) times daily.    [provider]  amLODipine (NORVASC) 5 MG tablet Take 5 mg by mouth daily.    [provider]  busPIRone (BUSPAR) 10 MG tablet Take 20 mg by mouth 3 (three) times daily.     [provider]  Cholecalciferol (VITAMIN D-3) 5000  UNITS TABS Take 5,000 Units by mouth daily.     [provider]  co-enzyme Q-10 50 MG capsule Take 50 mg by mouth daily.     [provider]  DHEA 25 MG CAPS Take 25 mg by mouth daily.     [provider]  gabapentin (NEURONTIN) 300 MG capsule Take 1 capsule (300 mg total) by mouth at bedtime. Patient not taking: Reported on 10/22/2018 10/20/18   Nita SicklePatel, Donika K, DO  glucosamine-chondroitin 500-400 MG tablet Take 1 tablet by mouth daily.     [provider]  hydrochlorothiazide (HYDRODIURIL) 25 MG tablet Take 12.5 mg by mouth daily.    [provider]  losartan (COZAAR) 100 MG tablet Take 100 mg by mouth daily.  04/28/18   [provider]  Multiple Vitamin (MULTIVITAMIN WITH MINERALS) TABS tablet Take 1 tablet by mouth daily.    [provider]  Omega 3 1000 MG CAPS Take 2,000 mg by mouth daily.     [provider]  triazolam (HALCION) 0.25 MG tablet Take 0.25 mg by mouth at bedtime.  06/23/18   [provider]  zaleplon (SONATA) 10 MG capsule Take 10-20 mg by mouth at bedtime as needed (For early morning awakening.).     [provider]  Zinc 50 MG CAPS Take 50 mg by mouth daily.     [provider]  zolpidem (AMBIEN CR) 12.5 MG CR tablet Take 12.5 mg by mouth at bedtime.     [provider]    Family History Family History  Problem Relation Age of Onset  . Cancer Mother        breast  . Intracerebral hemorrhage Father     Social History Social History   Tobacco Use  . Smoking status: Never Smoker  . Smokeless tobacco: Never Used  Substance Use Topics  . Alcohol use: Yes    Comment: 2 wine daily  . Drug use: Yes    Types: Marijuana    Comment: weekend use     Allergies   Gabapentin   Review of Systems Review of Systems  Constitutional: Negative for chills, diaphoresis and fever.  HENT: Negative for sore throat, trouble swallowing and voice change.   Respiratory: Positive for shortness of breath (resolved). Negative for cough.        Concern for aspiration of pills  Cardiovascular: Negative for chest pain.  Gastrointestinal: Negative for abdominal pain, nausea and vomiting.  Musculoskeletal: Negative for neck pain.  Neurological: Negative for syncope.  Psychiatric/Behavioral: The patient is nervous/anxious.   All other systems reviewed and are negative.    Physical Exam Updated Vital Signs BP (!) 137/100 (BP Location: Right Arm)   Pulse (!) 101   Temp 97.9 F (36.6 C) (Oral)   Resp (!) 22   SpO2 100%   Physical Exam Vitals signs and nursing note reviewed.  Constitutional:      General: He is not in acute distress.    Appearance: He is well-developed. He is not diaphoretic.  HENT:     Head: Normocephalic and atraumatic.     Mouth/Throat:      Mouth: Mucous membranes are moist.     Pharynx: Oropharynx is clear.     Comments: Dentition appears to be intact and stable.  No noted area of swelling or fluctuance.  No trismus.  Mouth opening to at least 3 finger widths.  Handles oral secretions without difficulty.  No noted facial swelling.  No sublingual swelling.  No swelling or tenderness to the submental or submandibular regions.  No swelling or tenderness into the soft tissues of the neck. Eyes:     Conjunctiva/sclera: Conjunctivae normal.  Neck:     Musculoskeletal: Neck supple.  Cardiovascular:     Rate and Rhythm: Normal rate and regular rhythm.     Pulses: Normal pulses.          Radial pulses are 2+ on the right side and 2+ on the left side.       Posterior tibial pulses are 2+ on the right side and 2+ on the left side.     Heart sounds: Normal heart sounds.     Comments: Pulse manually measured at 88. Pulmonary:     Effort: Pulmonary effort is normal. No respiratory distress.     Breath sounds: Normal breath sounds.  Abdominal:     Palpations: Abdomen is soft.     Tenderness: There is no abdominal tenderness. There is no guarding.  Musculoskeletal:     Right lower leg: No edema.     Left lower leg: No edema.  Lymphadenopathy:     Cervical: No cervical adenopathy.  Skin:    General: Skin is warm and dry.  Neurological:     Mental Status: He is alert and oriented to person, place, and time.     Comments:  Motor function intact in each of the extremities.  No gait disturbance. Coordination intact.  Facial cranial nerves were able to be assessed partially through observation, noted to be normal.  No facial droop.   Psychiatric:        Mood and Affect: Affect normal. Mood is anxious.        Speech: Speech normal.        Behavior: Behavior is hyperactive.     Comments: Patient initially appearing anxious, pacing around the room. I was able to calm him using verbal techniques and get him to sit and speak with me.       ED Treatments / Results  Labs (all labs ordered are listed, but only abnormal results are displayed) Labs Reviewed - No data to display  EKG None  Radiology No results found.  Procedures Procedures (including critical care time)  Medications Ordered in ED Medications - No data to display   Initial Impression / Assessment and Plan / ED Course  I have reviewed the triage vital signs and the nursing notes.  Pertinent labs & imaging results that were available during my care of the patient were reviewed by me and considered in my medical decision making (see chart for details).        Patient presents with concern that he may have aspirated Advil pills.  He appears to be anxious, however, is able to be redirected.  He is alert and oriented.  He has no evidence on exam of airway obstruction nor does he seem to have signs of lower airway irritation.  We did discuss further evaluation, such as chest x-ray, however, patient declined stating he "just wanted to be checked out" and then stated he was ready to leave.  Return precautions discussed. Patient voices understanding of these instructions, accepts the plan, and is comfortable with discharge.  Findings and plan of care discussed with Shaune Pollack, MD.   Final Clinical Impressions(s) / ED Diagnoses   Final diagnoses:  Globus sensation    ED Discharge Orders    None       Concepcion Living 01/23/19 2440  Shaune Pollack, MD 01/24/19 [phone]

## 2019-01-23 NOTE — ED Notes (Signed)
Pt refusing EKG. Pt very anxious and will not lay down.

## 2019-01-23 NOTE — ED Notes (Signed)
Pt lying in bed and a lot more calm at this time ; pt still refusing blood work and ekg ; pt states " I already feel a lot more calm now , I dont really need all that " Dr. Jeraldine Loots aware

## 2019-01-24 MED FILL — ALPRAZolam 0.5 MG TABS: 0.5 | 30 days supply | Qty: 20 | Fill #0

## 2019-01-24 MED FILL — PREGABALIN 75 MG CAPS: 75 | 30 days supply | Qty: 60 | Fill #1

## 2019-01-24 MED FILL — LORazepam 1 MG TABS: 1 | 30 days supply | Qty: 60 | Fill #1

## 2019-01-24 MED FILL — ZOLPIDEM TART ER 12.5 MG TA: 12.5 | 30 days supply | Qty: 30 | Fill #1

## 2019-01-24 MED FILL — ZALEPLON 10 MG CAPS: 10 | 30 days supply | Qty: 45 | Fill #2

## 2019-01-26 ENCOUNTER — Emergency Department (HOSPITAL_COMMUNITY): Payer: BLUE CROSS/BLUE SHIELD

## 2019-01-26 ENCOUNTER — Emergency Department (HOSPITAL_COMMUNITY)
Admission: EM | Admit: 2019-01-26 | Discharge: 2019-01-26 | Disposition: A | Discharge status: Home / Self Care | Payer: BLUE CROSS/BLUE SHIELD | Attending: Emergency Medicine | Admitting: Emergency Medicine

## 2019-01-26 ENCOUNTER — Encounter (HOSPITAL_COMMUNITY): Payer: Self-pay

## 2019-01-26 ENCOUNTER — Other Ambulatory Visit: Payer: Self-pay

## 2019-01-26 DIAGNOSIS — I10 Essential (primary) hypertension: Secondary | ICD-10-CM | POA: Insufficient documentation

## 2019-01-26 DIAGNOSIS — K5903 Drug induced constipation: Secondary | ICD-10-CM

## 2019-01-26 DIAGNOSIS — K4090 Unilateral inguinal hernia, without obstruction or gangrene, not specified as recurrent: Secondary | ICD-10-CM

## 2019-01-26 DIAGNOSIS — Z79899 Other long term (current) drug therapy: Secondary | ICD-10-CM | POA: Diagnosis not present

## 2019-01-26 DIAGNOSIS — K5641 Fecal impaction: Secondary | ICD-10-CM | POA: Diagnosis not present

## 2019-01-26 LAB — COMPREHENSIVE METABOLIC PANEL
ALT: 23 U/L (ref 0–44)
AST: 36 U/L (ref 15–41)
Albumin: 4.1 g/dL (ref 3.5–5.0)
Alkaline Phosphatase: 79 U/L (ref 38–126)
Anion gap: 9 (ref 5–15)
BUN: 19 mg/dL (ref 8–23)
CO2: 25 mmol/L (ref 22–32)
Calcium: 9.5 mg/dL (ref 8.9–10.3)
Chloride: 96 mmol/L — ABNORMAL LOW (ref 98–111)
Creatinine, Ser: 0.9 mg/dL (ref 0.61–1.24)
GFR calc Af Amer: >60 mL/min (ref >60)
GFR calc non Af Amer: >60 mL/min (ref >60)
Glucose, Bld: 88 mg/dL (ref 70–99)
Potassium: 3.4 mmol/L — ABNORMAL LOW (ref 3.5–5.1)
Sodium: 130 mmol/L — ABNORMAL LOW (ref 135–145)
Total Bilirubin: 1.2 mg/dL (ref 0.3–1.2)
Total Protein: 6.3 g/dL — ABNORMAL LOW (ref 6.5–8.1)

## 2019-01-26 LAB — CBC
HCT: 38 % — ABNORMAL LOW (ref 39.0–52.0)
Hemoglobin: 13.4 g/dL (ref 13.0–17.0)
MCH: 31.5 pg (ref 26.0–34.0)
MCHC: 35.3 g/dL (ref 30.0–36.0)
MCV: 89.4 fL (ref 80.0–100.0)
Platelets: 218 10*3/uL (ref 150–400)
RBC: 4.25 MIL/uL (ref 4.22–5.81)
RDW: 12.5 % (ref 11.5–15.5)
WBC: 8.7 10*3/uL (ref 4.0–10.5)
nRBC: 0 % (ref 0.0–0.2)

## 2019-01-26 LAB — URINALYSIS, ROUTINE W REFLEX MICROSCOPIC
Bilirubin Urine: NEGATIVE
Glucose, UA: NEGATIVE mg/dL
Hgb urine dipstick: NEGATIVE
Ketones, ur: NEGATIVE mg/dL
Leukocytes,Ua: NEGATIVE
Nitrite: NEGATIVE
Protein, ur: NEGATIVE mg/dL
Specific Gravity, Urine: 1.014 (ref 1.005–1.030)
pH: 7 (ref 5.0–8.0)

## 2019-01-26 MED ORDER — IOHEXOL 300 MG/ML  SOLN
100.0000 mL | Freq: Once | INTRAMUSCULAR | Status: AC | PRN
  Administered 2019-01-26: 100 mL via INTRAVENOUS

## 2019-01-26 MED ORDER — MINERAL OIL RE ENEM
1.0000 | ENEMA | Freq: Once | RECTAL | Status: AC
  Administered 2019-01-26: 1 via RECTAL
  Filled 2019-01-26 (×2): qty 1

## 2019-01-26 NOTE — ED Triage Notes (Signed)
Pt endorse "I think my inguinal hernia ruptured" Pt has hernia in left lower quadrant. Has a surgeon that is going to repair it in 2 months but pain is much worse and hernia is larger over last several days. VSS

## 2019-01-26 NOTE — Discharge Instructions (Addendum)
You were seen in the ED for abdominal pain, hernia pain and constipation.  On exam your hernia was easily reduced and CT showed no complications of a hernia to warrant emergent intervention or surgery.  You had significant amount of stool in the rectum and CT also showed prominent stool burden in the rectum, this called fecal impaction.  I suspect this is from your use of narcotic pain medicines which are classically known for causing constipation.  We offered warm water/mineral oil enema in the ED but you opted to do this at home.  This is reasonable.  Treatment of fecal impaction includes softening the stool with warm water enemas as needed.  ER nurse today provided education on how to do this at home.  You may need to do an enema more than once to soften the stool and help evacuate the stool.  Start using MiraLAX cleanout to facilitate bowel movement and prevent ongoing constipation.  Dissolve 1 capful of MiraLAX powder in 8 ounces of water and take this every 8 hours until you become regular.  Once you start having bowel movements, you can do this once a day.  Increase your fiber intake in your diet.    Can alternate ibuprofen or acetaminophen for pain as needed. Oxycodone or hydrocodone will put you at risk for more constipation and slow gut motility.  Follow-up with Dr. Daphine Deutscher as needed for elective surgery repair of your hernia.  Return to the ED if there is fevers, nausea, vomiting, abdominal pain, inability to have a bowel movement, no passing gas.

## 2019-01-26 NOTE — ED Notes (Signed)
Wife outside: [phone]

## 2019-01-26 NOTE — ED Provider Notes (Signed)
[facility] EMERGENCY DEPARTMENT Provider Note   CSN: [id number] Arrival date & time: 01/26/19  1103    History   Chief Complaint Chief Complaint  Patient presents with   Hernia    HPI Brad [name] is a 63 y.o. male with h/o cervical DDD s/p ACDF at C5/T1, HTN, anxiety, left inguinal hernia with elective surgical repair (Dr Daphine Deutscher) 2015, right direct inguinal hernia s/p open repair 2012 presents to ED for evaluation of pain in left inguinal hernia.  Onset suddenly 3 days ago. Constant, 10/10, sharp, radiating into left testicle. Associated with increased bulging that comes and goes when he stands, coughs better when he lays down.  He has not had a BM in 3 days and has had no flatus.  Feels like there is pressure in his rectum.  Has been straining on the toilet every day to have a BM without success.  Usually has a BM every day. Also noted decreased urination but no dysuria, hematuria, bladder incontinence. Takes daily oxycodone.  No associated fevers, chills, nausea, vomiting, blood in stool.  Feels like the hernia "ruptured".  No interventions. No alleviating factors. Has scheduled elective left hernia surgery in 2 months with Dr Daphine Deutscher.      HPI  Past Medical History:  Diagnosis Date   Allergy    Anxiety    Depression    DJD (degenerative joint disease), cervical    postiton with pillow under knees, cant turn neck    Dysentery, amebic, acute 1981   GERD (gastroesophageal reflux disease)    H/O bronchitis    H/O malaria 1984   Hearing loss    bilateral    Hypertension    labile Blood pressure   Inguinal hernia    Insomnia    early morning awakening   MVP (mitral valve prolapse)    "no problems"   Perianal pain    Personal history of colonic polyps    Tinnitus    right ear    Patient Active Problem List   Diagnosis Date Noted   Paresthesias 06/28/2018   Right inguinal hernia 04/12/2014   Inguinal hernia, left-repair [facility] Dec 2012  09/10/2011    Past Surgical History:  Procedure Laterality Date   CARPAL TUNNEL RELEASE  10/06, 5/10   right wrist    CARPAL TUNNEL RELEASE  3/10   left wrist    cervical spine discectomy   09/2005   COLONOSCOPY WITH PROPOFOL N/A 10/21/2015   Procedure: COLONOSCOPY WITH PROPOFOL;  Surgeon: Charolett Bumpers, MD;  Location: [facility];  Service: Endoscopy;  Laterality: N/A;   INGUINAL HERNIA REPAIR  08/16/2011   Procedure: HERNIA REPAIR INGUINAL ADULT;  Surgeon: Valarie Merino, MD;  Location: [facility];  Service: General;  Laterality: Left;   INGUINAL HERNIA REPAIR Right 05/03/2014   Procedure: OPEN RIGHT INGUINAL HERNIA REPAIR WITH MESH;  Surgeon: Wenda Low, MD;  Location: [facility];  Service: General;  Laterality: Right;   INSERTION OF MESH Right 05/03/2014   Procedure: INSERTION OF MESH;  Surgeon: Wenda Low, MD;  Location: [facility];  Service: General;  Laterality: Right;   TONSILLECTOMY  1960        Home Medications    Prior to Admission medications   Medication Sig Start Date End Date Taking? Authorizing Provider  ALPRAZolam (NIRAVAM) 0.5 MG dissolvable tablet Take 0.5 mg by mouth daily as needed for anxiety.    Yes [provider]  amLODipine (NORVASC) 5 MG tablet Take  5 mg by mouth daily.   Yes [provider]  busPIRone (BUSPAR) 10 MG tablet Take 20 mg by mouth 3 (three) times daily.    Yes [provider]  Cholecalciferol (VITAMIN D-3) 5000 UNITS TABS Take 5,000 Units by mouth daily.    Yes [provider]  co-enzyme Q-10 50 MG capsule Take 50 mg by mouth daily.    Yes [provider]  glucosamine-chondroitin 500-400 MG tablet Take 1 tablet by mouth daily.    Yes [provider]  hydrochlorothiazide (HYDRODIURIL) 25 MG tablet Take 12.5 mg by mouth daily.   Yes [provider]  LORazepam (ATIVAN) 1 MG tablet Take 1 mg by mouth 2 (two) times a day. 10/26/18  Yes [provider]    losartan (COZAAR) 100 MG tablet Take 100 mg by mouth daily.  04/28/18  Yes [provider]  metoprolol succinate (TOPROL-XL) 25 MG 24 hr tablet Take 25 mg by mouth daily.   Yes [provider]  Multiple Vitamin (MULTIVITAMIN WITH MINERALS) TABS tablet Take 1 tablet by mouth daily.   Yes [provider]  Omega 3 1000 MG CAPS Take 2,000 mg by mouth daily.    Yes [provider]  oxyCODONE (ROXICODONE) 15 MG immediate release tablet Take 15 mg by mouth every 6 (six) hours as needed for pain.   Yes [provider]  pregabalin (LYRICA) 75 MG capsule Take 75 mg by mouth 2 (two) times daily.   Yes [provider]  sildenafil (REVATIO) 20 MG tablet Take 60-100 mg by mouth daily as needed.   Yes [provider]  zaleplon (SONATA) 10 MG capsule Take 10-20 mg by mouth at bedtime as needed (For early morning awakening.).    Yes [provider]  Zinc 50 MG CAPS Take 50 mg by mouth daily.    Yes [provider]  zolpidem (AMBIEN CR) 12.5 MG CR tablet Take 12.5 mg by mouth at bedtime.    Yes [provider]  gabapentin (NEURONTIN) 300 MG capsule Take 1 capsule (300 mg total) by mouth at bedtime. Patient not taking: Reported on 10/22/2018 10/20/18   Nita Sickle K, DO  triazolam (HALCION) 0.25 MG tablet Take 0.25 mg by mouth at bedtime as needed.  06/23/18   [provider]    Family History Family History  Problem Relation Age of Onset   Cancer Mother        breast   Intracerebral hemorrhage Father     Social History Social History   Tobacco Use   Smoking status: Never Smoker   Smokeless tobacco: Never Used  Substance Use Topics   Alcohol use: Yes    Comment: 2 wine daily   Drug use: Yes    Types: Marijuana    Comment: weekend use     Allergies   Gabapentin   Review of Systems Review of Systems  Gastrointestinal: Positive for abdominal pain and constipation.       Hernia pain  No  flatus   Genitourinary: Positive for decreased urine volume.  All other systems reviewed and are negative.    Physical Exam Updated Vital Signs BP (!) 158/89 (BP Location: Right Arm)    Pulse 70    Temp 97.6 F (36.4 C) (Oral)    SpO2 100%   Physical Exam Vitals signs and nursing note reviewed. Exam conducted with a chaperone present.  Constitutional:      Appearance: He is well-developed.     Comments:  Non toxic.  HENT:     Head: Normocephalic and atraumatic.     Nose: Nose normal.  Eyes:     Conjunctiva/sclera: Conjunctivae normal.  Neck:     Musculoskeletal: Normal range of motion.  Cardiovascular:     Rate and Rhythm: Normal rate and regular rhythm.     Heart sounds: Normal heart sounds.  Pulmonary:     Effort: Pulmonary effort is normal.     Breath sounds: Normal breath sounds.  Abdominal:     General: Bowel sounds are normal.     Palpations: Abdomen is soft.     Tenderness: There is abdominal tenderness.     Hernia: A hernia is present.     Comments: Lemon sized hernia to left inguinal crease when standing, tender with guarding but reducible.  Hernia self reduces with laying flat.  Mild LLQ tenderness with guarding.  No overlaying skin changes to hernia. No suprapubic or CVA tenderness. No BS to lower quadrants.  No distention, rigidity.   Genitourinary:    Comments:  Circumcised male. No groin lymphadenopathy. Left inguinal hernia noted as above, tender but reducible.  Scrotum without edema, asymmetry or tenderness. Non tender testicles. Epididymis and spermatic cord without tenderness or masses, bilaterally. Cremasteric reflex intact.   Moderate amount of very hard stool in distal rectal vault consistent with fecal impaction. I was only able to remove a small amount of stool due to patient discomfort. No melena. No external hemorrhoids or fissures. Good rectal tone. Musculoskeletal: Normal range of motion.  Skin:    General: Skin is warm and dry.     Capillary  Refill: Capillary refill takes less than 2 seconds.  Neurological:     Mental Status: He is alert.  Psychiatric:        Behavior: Behavior normal.      ED Treatments / Results  Labs (all labs ordered are listed, but only abnormal results are displayed) Labs Reviewed  COMPREHENSIVE METABOLIC PANEL - Abnormal; Notable for the following components:      Result Value   Sodium 130 (*)    Potassium 3.4 (*)    Chloride 96 (*)    Total Protein 6.3 (*)    All other components within normal limits  CBC - Abnormal; Notable for the following components:   HCT 38.0 (*)    All other components within normal limits  URINE CULTURE  URINALYSIS, ROUTINE W REFLEX MICROSCOPIC    EKG None  Radiology Ct Abdomen Pelvis W Contrast  Result Date: 01/26/2019 CLINICAL DATA:  Enlarging painful left inguinal hernia. Scheduled for inguinal hernia surgery in 2 months. Fecal impaction. EXAM: CT ABDOMEN AND PELVIS WITH CONTRAST TECHNIQUE: Multidetector CT imaging of the abdomen and pelvis was performed using the standard protocol following bolus administration of intravenous contrast. CONTRAST:  OMNIPAQUE IOHEXOL 300 MG/ML  SOLN COMPARISON:  None. FINDINGS: Lower chest: Clear lung bases. No significant pleural or pericardial effusion. Hepatobiliary: The liver is normal in density without suspicious focal abnormality. Tiny low-density hepatic lesions likely cysts. No evidence of gallstones, gallbladder wall thickening or significant biliary dilatation. Pancreas: Unremarkable. No pancreatic ductal dilatation or surrounding inflammatory changes. Spleen: Normal in size without focal abnormality. Adrenals/Urinary Tract: Both adrenal glands appear normal. Both kidneys appear normal. No evidence of urinary tract calculus or hydronephrosis. The bladder appears unremarkable for its degree of distention. Stomach/Bowel: No enteric contrast was administered. No evidence of bowel wall thickening, distention or surrounding  inflammation. The cecum is located low in the  pelvis, and the appendix is not confidently identified. There is prominent stool in the cecum and rectum. No herniated bowel. Vascular/Lymphatic: There are no enlarged abdominal or pelvic lymph nodes. Mild aortic and branch vessel atherosclerosis. No acute vascular findings. Reproductive: The prostate gland and seminal vesicles appear normal. Other: No evidence of abdominal wall mass or hernia. No ascites. Musculoskeletal: No acute or significant osseous findings. There is lower lumbar spondylosis associated with a convex left scoliosis. IMPRESSION: 1. No acute findings identified. No significant abdominal wall hernia. 2. Prominent stool in the cecum and rectum without evidence of bowel obstruction or wall thickening. 3.  Aortic Atherosclerosis (ICD10-I70.0). Electronically Signed   By: Carey Bullocks M.D.   On: 01/26/2019 14:46    Procedures Procedures (including critical care time)  Medications Ordered in ED Medications  mineral oil enema 1 enema (has no administration in time range)  iohexol (OMNIPAQUE) 300 MG/ML solution 100 mL (100 mLs Intravenous Contrast Given 01/26/19 1429)     Initial Impression / Assessment and Plan / ED Course  I have reviewed the triage vital signs and the nursing notes.  Pertinent labs & imaging results that were available during my care of the patient were reviewed by me and considered in my medical decision making (see chart for details).  Clinical Course as of Jan 26 1516  Fri Jan 26, 2019  1327 Called CT tech regarding delay - tech reviewed labs, will take patient next    [CG]  1452 IMPRESSION: 1. No acute findings identified. No significant abdominal wall hernia. 2. Prominent stool in the cecum and rectum without evidence of bowel obstruction or wall thickening. 3. Aortic Atherosclerosis (ICD10-I70.0).  CT ABDOMEN PELVIS W CONTRAST [CG]    Clinical Course User Index [CG] Liberty Handy, PA-C     Hernia is larger and more painful than usual per patient however exam is reassuring, hernia is mildly tender and easily reducible.  A hernia complication such as strangulation, extrangulation is not consistent with exam.  He has fecal impaction and this is likely primary cause to abdominal pain.  On daily oxycodone contributing to this.  He has been straining for last 3 days and suspect hernia has enlarged due to this.  Given his new onset constipation, fecal impaction, focal LLQ tenderness around inguinal hernia and previous multiple abdominal surgeries in the past will obtain labs, CTAP to r/o obstruction, ileus, other acute intraabd pathology related to hernia.  Lower suspicion for renal stone, pyelonephritis/UTI, diverticulitis.   1515:  CTAP without hernia complications, diverticulitis but shows prominent stool in cecum/rectum consistent with fecal impaction.  Pt's symptoms almost completely resolved in ED and he didn't require pain meds in ED. No emesis. Tolerating PO.  Offered warm water/mineral oil enema in ED to soften stool and facilitate evacuation, but opted to do this at home.  Will dc with high fiber diet, miralax clean out and warm water enemas. Strict return precautions given. RN Steward Drone educated patient on how to do enema at home.  Final Clinical Impressions(s) / ED Diagnoses   Final diagnoses:  Fecal impaction (HCC)  Reducible left inguinal hernia  Drug-induced constipation    ED Discharge Orders    None       Jerrell Mylar 01/26/19 1517    Eber Hong, MD 01/27/19 1037

## 2019-01-27 LAB — URINE CULTURE: Culture: <10000 — AB

## 2019-02-02 MED FILL — AMLODIPINE BESYLATE 5 MG TA: 5 | 90 days supply | Qty: 90 | Fill #0

## 2019-02-02 MED FILL — busPIRone HCL 10 MG TABS: 10 | 30 days supply | Qty: 180 | Fill #1

## 2019-02-05 ENCOUNTER — Other Ambulatory Visit: Payer: Self-pay

## 2019-02-05 ENCOUNTER — Emergency Department (HOSPITAL_COMMUNITY)
Admission: EM | Admit: 2019-02-05 | Discharge: 2019-02-06 | Disposition: A | Discharge status: Other Acute Inpatient Hospital | Payer: BLUE CROSS/BLUE SHIELD | Attending: Emergency Medicine | Admitting: Emergency Medicine

## 2019-02-05 ENCOUNTER — Encounter (HOSPITAL_COMMUNITY): Payer: Self-pay

## 2019-02-05 DIAGNOSIS — F29 Unspecified psychosis not due to a substance or known physiological condition: Secondary | ICD-10-CM | POA: Diagnosis not present

## 2019-02-05 DIAGNOSIS — I10 Essential (primary) hypertension: Secondary | ICD-10-CM | POA: Insufficient documentation

## 2019-02-05 DIAGNOSIS — R4689 Other symptoms and signs involving appearance and behavior: Secondary | ICD-10-CM | POA: Diagnosis not present

## 2019-02-05 DIAGNOSIS — F332 Major depressive disorder, recurrent severe without psychotic features: Secondary | ICD-10-CM | POA: Insufficient documentation

## 2019-02-05 DIAGNOSIS — K5900 Constipation, unspecified: Secondary | ICD-10-CM | POA: Diagnosis not present

## 2019-02-05 DIAGNOSIS — R109 Unspecified abdominal pain: Secondary | ICD-10-CM | POA: Diagnosis not present

## 2019-02-05 DIAGNOSIS — R52 Pain, unspecified: Secondary | ICD-10-CM | POA: Diagnosis not present

## 2019-02-05 DIAGNOSIS — Z79899 Other long term (current) drug therapy: Secondary | ICD-10-CM | POA: Insufficient documentation

## 2019-02-05 DIAGNOSIS — Z1339 Encounter for screening examination for other mental health and behavioral disorders: Secondary | ICD-10-CM | POA: Diagnosis not present

## 2019-02-05 DIAGNOSIS — Z7689 Persons encountering health services in other specified circumstances: Secondary | ICD-10-CM

## 2019-02-05 DIAGNOSIS — R1084 Generalized abdominal pain: Secondary | ICD-10-CM | POA: Diagnosis not present

## 2019-02-05 LAB — COMPREHENSIVE METABOLIC PANEL
ALT: 25 U/L (ref 0–44)
AST: 32 U/L (ref 15–41)
Albumin: 4.8 g/dL (ref 3.5–5.0)
Alkaline Phosphatase: 88 U/L (ref 38–126)
Anion gap: 9 (ref 5–15)
BUN: 23 mg/dL (ref 8–23)
CO2: 26 mmol/L (ref 22–32)
Calcium: 9.6 mg/dL (ref 8.9–10.3)
Chloride: 95 mmol/L — ABNORMAL LOW (ref 98–111)
Creatinine, Ser: 0.74 mg/dL (ref 0.61–1.24)
GFR calc Af Amer: >60 mL/min (ref >60)
GFR calc non Af Amer: >60 mL/min (ref >60)
Glucose, Bld: 96 mg/dL (ref 70–99)
Potassium: 3.3 mmol/L — ABNORMAL LOW (ref 3.5–5.1)
Sodium: 130 mmol/L — ABNORMAL LOW (ref 135–145)
Total Bilirubin: 0.9 mg/dL (ref 0.3–1.2)
Total Protein: 7.3 g/dL (ref 6.5–8.1)

## 2019-02-05 LAB — URINALYSIS, ROUTINE W REFLEX MICROSCOPIC
Bilirubin Urine: NEGATIVE
Glucose, UA: NEGATIVE mg/dL
Hgb urine dipstick: NEGATIVE
Ketones, ur: NEGATIVE mg/dL
Leukocytes,Ua: NEGATIVE
Nitrite: NEGATIVE
Protein, ur: NEGATIVE mg/dL
Specific Gravity, Urine: 1.017 (ref 1.005–1.030)
pH: 8 (ref 5.0–8.0)

## 2019-02-05 LAB — ACETAMINOPHEN LEVEL: Acetaminophen (Tylenol), Serum: <10 ug/mL — ABNORMAL LOW (ref 10–30)

## 2019-02-05 LAB — CBC
HCT: 38.5 % — ABNORMAL LOW (ref 39.0–52.0)
Hemoglobin: 13.7 g/dL (ref 13.0–17.0)
MCH: 32.3 pg (ref 26.0–34.0)
MCHC: 35.6 g/dL (ref 30.0–36.0)
MCV: 90.8 fL (ref 80.0–100.0)
Platelets: 228 10*3/uL (ref 150–400)
RBC: 4.24 MIL/uL (ref 4.22–5.81)
RDW: 12.2 % (ref 11.5–15.5)
WBC: 6.7 10*3/uL (ref 4.0–10.5)
nRBC: 0 % (ref 0.0–0.2)

## 2019-02-05 LAB — SALICYLATE LEVEL: Salicylate Lvl: <7 mg/dL (ref 2.8–30.0)

## 2019-02-05 LAB — RAPID URINE DRUG SCREEN, HOSP PERFORMED
Amphetamines: NOT DETECTED
Barbiturates: NOT DETECTED
Benzodiazepines: POSITIVE — AB
Cocaine: NOT DETECTED
Opiates: NOT DETECTED
Tetrahydrocannabinol: NOT DETECTED

## 2019-02-05 LAB — ETHANOL: Alcohol, Ethyl (B): <10 mg/dL (ref <10)

## 2019-02-05 MED ORDER — LORAZEPAM 1 MG PO TABS
1.0000 mg | ORAL_TABLET | Freq: Once | ORAL | Status: AC
  Administered 2019-02-05: 1 mg via ORAL
  Filled 2019-02-05: qty 1

## 2019-02-05 MED ORDER — LOSARTAN POTASSIUM 50 MG PO TABS
100.0000 mg | ORAL_TABLET | Freq: Every day | ORAL | Status: DC
  Administered 2019-02-06: 100 mg via ORAL
  Filled 2019-02-05: qty 2

## 2019-02-05 MED ORDER — DEXAMETHASONE SODIUM PHOSPHATE 10 MG/ML IJ SOLN
10.0000 mg | Freq: Once | INTRAMUSCULAR | Status: AC
  Administered 2019-02-05: 10 mg via INTRAMUSCULAR
  Filled 2019-02-05: qty 1

## 2019-02-05 MED ORDER — ZOLPIDEM TARTRATE 5 MG PO TABS
5.0000 mg | ORAL_TABLET | Freq: Once | ORAL | Status: AC
  Administered 2019-02-05: 5 mg via ORAL
  Filled 2019-02-05: qty 1

## 2019-02-05 MED ORDER — KETOROLAC TROMETHAMINE 60 MG/2ML IM SOLN
60.0000 mg | Freq: Once | INTRAMUSCULAR | Status: AC
  Administered 2019-02-05: 60 mg via INTRAMUSCULAR
  Filled 2019-02-05: qty 2

## 2019-02-05 MED ORDER — DOCUSATE SODIUM 100 MG PO CAPS
100.0000 mg | ORAL_CAPSULE | Freq: Once | ORAL | Status: AC
  Administered 2019-02-05: 100 mg via ORAL
  Filled 2019-02-05: qty 1

## 2019-02-05 MED ORDER — HYDROMORPHONE HCL 1 MG/ML IJ SOLN
1.0000 mg | Freq: Once | INTRAMUSCULAR | Status: AC
  Administered 2019-02-05: 1 mg via INTRAMUSCULAR
  Filled 2019-02-05: qty 1

## 2019-02-05 MED ORDER — GLYCERIN (LAXATIVE) 2.1 G RE SUPP
1.0000 | Freq: Once | RECTAL | Status: AC
  Administered 2019-02-05: 23:00:00 1 via RECTAL
  Filled 2019-02-05: qty 1

## 2019-02-05 MED ORDER — ZOLPIDEM TARTRATE 5 MG PO TABS
5.0000 mg | ORAL_TABLET | Freq: Every evening | ORAL | Status: DC | PRN
  Administered 2019-02-05: 5 mg via ORAL
  Filled 2019-02-05: qty 1

## 2019-02-05 MED ORDER — LORAZEPAM 1 MG PO TABS
2.0000 mg | ORAL_TABLET | Freq: Once | ORAL | Status: AC
  Administered 2019-02-05: 2 mg via ORAL
  Filled 2019-02-05: qty 2

## 2019-02-05 MED ORDER — AMLODIPINE BESYLATE 5 MG PO TABS
5.0000 mg | ORAL_TABLET | Freq: Every day | ORAL | Status: DC
  Administered 2019-02-06: 5 mg via ORAL
  Filled 2019-02-05 (×2): qty 1

## 2019-02-05 MED ORDER — PREGABALIN 50 MG PO CAPS
75.0000 mg | ORAL_CAPSULE | Freq: Two times a day (BID) | ORAL | Status: DC
  Administered 2019-02-05 – 2019-02-06 (×2): 75 mg via ORAL
  Filled 2019-02-05 (×2): qty 1

## 2019-02-05 MED ORDER — METOPROLOL SUCCINATE ER 25 MG PO TB24: 25.0000 mg | ORAL_TABLET | Freq: Every day | ORAL | Status: DC

## 2019-02-05 MED ORDER — HYDROCHLOROTHIAZIDE 25 MG PO TABS
12.5000 mg | ORAL_TABLET | Freq: Every day | ORAL | Status: DC
  Administered 2019-02-06: 12.5 mg via ORAL
  Filled 2019-02-05: qty 0.5

## 2019-02-05 MED ORDER — METOPROLOL SUCCINATE ER 25 MG PO TB24
25.0000 mg | ORAL_TABLET | Freq: Every day | ORAL | Status: DC
  Administered 2019-02-06: 25 mg via ORAL
  Filled 2019-02-05: qty 1

## 2019-02-05 MED ORDER — BUSPIRONE HCL 10 MG PO TABS
20.0000 mg | ORAL_TABLET | Freq: Three times a day (TID) | ORAL | Status: DC
  Administered 2019-02-05 – 2019-02-06 (×3): 20 mg via ORAL
  Filled 2019-02-05 (×3): qty 2

## 2019-02-05 MED ORDER — ZOLPIDEM TARTRATE 10 MG PO TABS: 10.0000 mg | ORAL_TABLET | Freq: Every evening | ORAL | Status: DC | PRN

## 2019-02-05 MED ORDER — HYDROCHLOROTHIAZIDE 25 MG PO TABS: 12.5000 mg | ORAL_TABLET | Freq: Every day | ORAL | Status: DC

## 2019-02-05 MED ORDER — ZOLPIDEM TARTRATE 5 MG PO TABS
5.0000 mg | ORAL_TABLET | Freq: Once | ORAL | Status: AC
  Administered 2019-02-05: 5 mg via ORAL

## 2019-02-05 MED ORDER — ALPRAZOLAM 0.5 MG PO TABS
0.5000 mg | ORAL_TABLET | Freq: Every day | ORAL | Status: DC | PRN
  Administered 2019-02-05 – 2019-02-06 (×2): 0.5 mg via ORAL
  Filled 2019-02-05 (×2): qty 1

## 2019-02-05 MED ORDER — LORAZEPAM 1 MG PO TABS
1.0000 mg | ORAL_TABLET | Freq: Once | ORAL | Status: DC
  Filled 2019-02-05: qty 1

## 2019-02-05 MED ORDER — POLYETHYLENE GLYCOL 3350 17 G PO PACK
17.0000 g | PACK | Freq: Once | ORAL | Status: AC
  Administered 2019-02-05: 17 g via ORAL
  Filled 2019-02-05: qty 1

## 2019-02-05 MED ORDER — POLYETHYLENE GLYCOL 3350 17 G PO PACK
17.0000 g | PACK | Freq: Every day | ORAL | Status: DC | PRN
  Administered 2019-02-05: 17 g via ORAL
  Filled 2019-02-05 (×2): qty 1

## 2019-02-05 MED ORDER — SILDENAFIL CITRATE 20 MG PO TABS: 60.0000 mg | ORAL_TABLET | Freq: Every day | ORAL | Status: DC | PRN

## 2019-02-05 MED ORDER — AMLODIPINE BESYLATE 5 MG PO TABS: 5.0000 mg | ORAL_TABLET | Freq: Every day | ORAL | Status: DC
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
## 2019-02-05 NOTE — ED Provider Notes (Signed)
Called by nursing to reassess patient for back pain and abdominal pain.  Patient is for planned inpatient psych admission.  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 Before reviewing the chart,  I did give the patient the option of assessment by an alternative provider.  Patient advised he was agreeable to having me review his chart and provide care.  Patient advises that he is having significant problems with pain in his lower back and left lower abdomen.  There have been several concurrent medical issues.  Patient reports that he does have chronic element of low back pain and pedal paresthesia, is being treated by neurology, Dr. Nita Sickleonika Patel and has been treated by [facility]. last MRI was 08/18/2018.  There is a moderate foraminal stenosis and degenerative disc disease.  Patient also has had hernia repair with recurrence of reducible inguinal hernias.  Patient was seen in the emergency department and CT scans done 9 days ago to assess for inguinal hernia pain and constipation.  No acute findings on CT.  Patient has been in significant pain.  He has been pacing extensively.  He does report this is likely contributing to symptoms as well.  Due to stress and anxiety he has been doing a lot of pacing and walking over the past several weeks.  He thinks this is contributed to weight loss as well.  Patient also feels that constipation is a significant problem.  He does have a periodic use of oxycodone 15 mg IR for pain control.  He reports he has taken some occasional MiraLAX but has not been consistently taking stool softener or MiraLAX for bowel movement.  At time of my evaluation, patient is anxious and distressed in pain and pacing about.  He is ambulating with intact strength for bilateral lower extremities.  Patient can get in and out of the bed at will without signs of lower extremity weakness. 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 Valeria BatmanJoe Zammit perform abdominal and hernia exam.  Per his exam there is no incarcerated or bulging hernias at this time.  Lower abdomen on the left was tender.)  Patient was examined earlier same day by PA-C Couture, rectal exam was done documented is scant brown stool without any impaction or hard stool in the rectal vault.  Patient is expressing significant pain and appears very anxious.  He does have chronic back pain possibly exacerbated by extensive pacing and walking over the past several weeks.  Decadron ordered 10 mg IM with Toradol 60 mg IM and Dilaudid 1 mg IM for control of back pain.  Anxiety is likely exacerbating somatic symptoms.  Patient had been given 1 mg of Ativan earlier in the day at time of evaluation.  He chronically takes Xanax 0.5 mg as needed.  I added Ativan 2 mg p.o. due to significant anxiety with pacing and ruminations.  I did extensively discussed the possibility of repeat diagnostic studies of  CT scan and MRI given the degree of pain patient is expressing.  Patient does not think that he needs repeat diagnostic imaging.  He does feel that a significant amount of the abdominal pain is due to the chronic pain associated with his hernias and constipation.  The back pain has been a chronic feature with associated pedal pain and paresthesia.  After extensive chart review and review of prior diagnostic studies will defer additional imaging at this time.  Charlesetta Shanks, MD 02/05/19 [phone]

## 2019-02-05 NOTE — ED Triage Notes (Signed)
Pt has been IVC and papers are on the way per [employer].
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
## 2019-02-05 NOTE — BH Assessment (Addendum)
[facility] Assessment Progress Note Case was staffed with Reita Cliche DNP who recommended a inpatient admssion.

## 2019-02-05 NOTE — ED Notes (Signed)
Since start of shift patient at doorway complaining of pain, moaning loudly, stating he cant stand the pain. Dr Hulen Skains and requested meds for pain and she came on the unit, spoke with him and ordered meds which were given along with the Xanax which was given for anxiety. He is very needy, impatient. When came in his room to give him meds, which were three injections complained about that. Demanding to call his wife but doesn't know her number the two numbers writer provided him, one from the IVC the other from snapshot he said arent right. Continues to stand at the doorway with same requests despite telling him I would get to his request but other matters more pressing. He asked about his disposition and told hospitalization was recommended and a bed was being found for him. Accepting of this information. Sitter at bedside, monitoring safety.
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
## 2019-02-05 NOTE — BH Assessment (Signed)
02/05/2019: Case was staffed with Reita Cliche DNP who recommended a inpatient admssion. Faxed referrals to the following hospitals for review:  [facility]  [facility]  [facility]  [facility]  [place] [facility]  [facility]  [facility]  [facility]  [facility]  [facility]  [facility]
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
## 2019-02-05 NOTE — ED Notes (Signed)
Bed: WA05 Expected date:  Expected time:  Means of arrival:  Comments: 
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
## 2019-02-05 NOTE — ED Notes (Signed)
Reports no BM for three days, has been ordered and taken multiple med to help him have a BM but so far no results. He states his pain is about a 6 but has had periods of time in his own life, not here where he is without pain. Pleasant, calmer, less demanding. Has had multiple meds tonight for anxiety and pain but has not slept at all this shift. He has a prn of Ambien ordered but states he takes 12.5 mg q HS and wont accept the 5 mg he has available. Notified Dr Johnney Killian of his request.

## 2019-02-05 NOTE — ED Notes (Signed)
Pt to room 30. Oriented to unit. Pt IVC .  Pt anxious, needy, disorganized thinking.  Pt needs redirection.

## 2019-02-06 DIAGNOSIS — I10 Essential (primary) hypertension: Secondary | ICD-10-CM | POA: Diagnosis not present

## 2019-02-06 DIAGNOSIS — F4310 Post-traumatic stress disorder, unspecified: Secondary | ICD-10-CM | POA: Diagnosis not present

## 2019-02-06 DIAGNOSIS — F411 Generalized anxiety disorder: Secondary | ICD-10-CM | POA: Diagnosis not present

## 2019-02-06 DIAGNOSIS — F3110 Bipolar disorder, current episode manic without psychotic features, unspecified: Secondary | ICD-10-CM | POA: Diagnosis not present

## 2019-02-06 DIAGNOSIS — Z79899 Other long term (current) drug therapy: Secondary | ICD-10-CM | POA: Diagnosis not present

## 2019-02-06 DIAGNOSIS — K4090 Unilateral inguinal hernia, without obstruction or gangrene, not specified as recurrent: Secondary | ICD-10-CM | POA: Diagnosis not present

## 2019-02-06 DIAGNOSIS — F332 Major depressive disorder, recurrent severe without psychotic features: Secondary | ICD-10-CM | POA: Diagnosis not present

## 2019-02-06 DIAGNOSIS — F319 Bipolar disorder, unspecified: Secondary | ICD-10-CM | POA: Diagnosis not present

## 2019-02-06 DIAGNOSIS — K5900 Constipation, unspecified: Secondary | ICD-10-CM | POA: Diagnosis not present

## 2019-02-06 DIAGNOSIS — R45851 Suicidal ideations: Secondary | ICD-10-CM | POA: Diagnosis not present

## 2019-02-06 DIAGNOSIS — M519 Unspecified thoracic, thoracolumbar and lumbosacral intervertebral disc disorder: Secondary | ICD-10-CM | POA: Diagnosis not present

## 2019-02-06 DIAGNOSIS — G8929 Other chronic pain: Secondary | ICD-10-CM | POA: Diagnosis not present

## 2019-02-06 DIAGNOSIS — R4689 Other symptoms and signs involving appearance and behavior: Secondary | ICD-10-CM | POA: Diagnosis not present

## 2019-02-06 NOTE — Progress Notes (Signed)
Received Brad [name] in his room asleep with the sitter at the bedside. No noted distress noted. He slept throughout the night.

## 2019-02-06 NOTE — ED Notes (Signed)
MRI called to send for patient.  Pt stated that he didn't feel like he needed and MRI.  MRI was notified and 15 minutes later this patient asked if it was too late to get MRI.  MRI is now busy and stated that it would be later today.
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
## 2019-02-06 NOTE — Discharge Summary (Signed)
  Patient to be transferred to [facility] for [facility] treatment   Shuvon B. Rankin, NP

## 2019-02-06 NOTE — ED Notes (Signed)
Prior to going to sleep had not yet had a BM.

## 2019-02-07 DIAGNOSIS — F319 Bipolar disorder, unspecified: Secondary | ICD-10-CM | POA: Diagnosis not present

## 2019-02-08 DIAGNOSIS — F319 Bipolar disorder, unspecified: Secondary | ICD-10-CM | POA: Diagnosis not present

## 2019-02-09 DIAGNOSIS — F319 Bipolar disorder, unspecified: Secondary | ICD-10-CM | POA: Diagnosis not present

## 2019-02-10 DIAGNOSIS — F319 Bipolar disorder, unspecified: Secondary | ICD-10-CM | POA: Diagnosis not present

## 2019-02-11 DIAGNOSIS — F319 Bipolar disorder, unspecified: Secondary | ICD-10-CM | POA: Diagnosis not present

## 2019-02-12 DIAGNOSIS — F319 Bipolar disorder, unspecified: Secondary | ICD-10-CM | POA: Diagnosis not present

## 2019-02-15 DIAGNOSIS — R413 Other amnesia: Secondary | ICD-10-CM | POA: Diagnosis not present

## 2019-02-15 DIAGNOSIS — M79605 Pain in left leg: Secondary | ICD-10-CM | POA: Diagnosis not present

## 2019-02-15 DIAGNOSIS — M79604 Pain in right leg: Secondary | ICD-10-CM | POA: Diagnosis not present

## 2019-02-15 DIAGNOSIS — G629 Polyneuropathy, unspecified: Secondary | ICD-10-CM | POA: Diagnosis not present

## 2019-02-15 DIAGNOSIS — G6289 Other specified polyneuropathies: Secondary | ICD-10-CM | POA: Diagnosis not present

## 2019-02-19 DIAGNOSIS — F3112 Bipolar disorder, current episode manic without psychotic features, moderate: Secondary | ICD-10-CM | POA: Diagnosis not present

## 2019-02-22 ENCOUNTER — Emergency Department (HOSPITAL_COMMUNITY)
Admission: EM | Admit: 2019-02-22 | Discharge: 2019-02-23 | Disposition: A | Discharge status: Home / Self Care | Payer: BC Managed Care – PPO | Attending: Emergency Medicine | Admitting: Emergency Medicine

## 2019-02-22 ENCOUNTER — Other Ambulatory Visit: Payer: Self-pay

## 2019-02-22 ENCOUNTER — Emergency Department (HOSPITAL_COMMUNITY): Payer: BC Managed Care – PPO

## 2019-02-22 DIAGNOSIS — K469 Unspecified abdominal hernia without obstruction or gangrene: Secondary | ICD-10-CM | POA: Diagnosis not present

## 2019-02-22 DIAGNOSIS — F329 Major depressive disorder, single episode, unspecified: Secondary | ICD-10-CM | POA: Insufficient documentation

## 2019-02-22 DIAGNOSIS — Z0389 Encounter for observation for other suspected diseases and conditions ruled out: Secondary | ICD-10-CM | POA: Diagnosis not present

## 2019-02-22 DIAGNOSIS — Z79899 Other long term (current) drug therapy: Secondary | ICD-10-CM | POA: Insufficient documentation

## 2019-02-22 DIAGNOSIS — F419 Anxiety disorder, unspecified: Secondary | ICD-10-CM | POA: Diagnosis not present

## 2019-02-22 DIAGNOSIS — F411 Generalized anxiety disorder: Secondary | ICD-10-CM | POA: Insufficient documentation

## 2019-02-22 DIAGNOSIS — M25531 Pain in right wrist: Secondary | ICD-10-CM | POA: Diagnosis not present

## 2019-02-22 DIAGNOSIS — I10 Essential (primary) hypertension: Secondary | ICD-10-CM | POA: Diagnosis not present

## 2019-02-22 DIAGNOSIS — R109 Unspecified abdominal pain: Secondary | ICD-10-CM | POA: Diagnosis not present

## 2019-02-22 LAB — COMPREHENSIVE METABOLIC PANEL
ALT: 27 U/L (ref 0–44)
AST: 30 U/L (ref 15–41)
Albumin: 4.3 g/dL (ref 3.5–5.0)
Alkaline Phosphatase: 85 U/L (ref 38–126)
Anion gap: 9 (ref 5–15)
BUN: 19 mg/dL (ref 8–23)
CO2: 24 mmol/L (ref 22–32)
Calcium: 9.6 mg/dL (ref 8.9–10.3)
Chloride: 96 mmol/L — ABNORMAL LOW (ref 98–111)
Creatinine, Ser: 0.99 mg/dL (ref 0.61–1.24)
GFR calc Af Amer: >60 mL/min (ref >60)
GFR calc non Af Amer: >60 mL/min (ref >60)
Glucose, Bld: 103 mg/dL — ABNORMAL HIGH (ref 70–99)
Potassium: 3.7 mmol/L (ref 3.5–5.1)
Sodium: 129 mmol/L — ABNORMAL LOW (ref 135–145)
Total Bilirubin: 1.1 mg/dL (ref 0.3–1.2)
Total Protein: 6.7 g/dL (ref 6.5–8.1)

## 2019-02-22 LAB — CBC
HCT: 38.1 % — ABNORMAL LOW (ref 39.0–52.0)
Hemoglobin: 13.6 g/dL (ref 13.0–17.0)
MCH: 31.4 pg (ref 26.0–34.0)
MCHC: 35.7 g/dL (ref 30.0–36.0)
MCV: 88 fL (ref 80.0–100.0)
Platelets: 210 10*3/uL (ref 150–400)
RBC: 4.33 MIL/uL (ref 4.22–5.81)
RDW: 11.9 % (ref 11.5–15.5)
WBC: 8.7 10*3/uL (ref 4.0–10.5)
nRBC: 0 % (ref 0.0–0.2)

## 2019-02-22 LAB — RAPID URINE DRUG SCREEN, HOSP PERFORMED
Amphetamines: NOT DETECTED
Barbiturates: NOT DETECTED
Benzodiazepines: POSITIVE — AB
Cocaine: NOT DETECTED
Opiates: NOT DETECTED
Tetrahydrocannabinol: NOT DETECTED

## 2019-02-22 LAB — ETHANOL: Alcohol, Ethyl (B): <10 mg/dL (ref <10)

## 2019-02-22 MED ORDER — OXYCODONE HCL ER 15 MG PO T12A
15.0000 mg | EXTENDED_RELEASE_TABLET | Freq: Two times a day (BID) | ORAL | Status: DC
  Administered 2019-02-22: 15 mg via ORAL
  Filled 2019-02-22: qty 1

## 2019-02-22 MED ORDER — MILK AND MOLASSES ENEMA
1.0000 | Freq: Once | RECTAL | Status: DC
  Filled 2019-02-22: qty 240

## 2019-02-22 NOTE — ED Triage Notes (Signed)
Pt here for evaluation of inguinal hernia that's growing, hurt his right wrist last night, has been constipated, recent biopsy on LLE and he thinks the wounds are infected. Loss of 20 lbs in the last few months and had decreased appetite.

## 2019-02-22 NOTE — ED Notes (Signed)
This RN went to meet the patient and the visitor. The patient states "My leg wounds from my biopsy are infected. I have constipation. My inguinal hernia is hurting. I think my wrist is broke, I cannot move it. " The patient spoke calmly to the nurse. He had full range of motion of his wrist.

## 2019-02-22 NOTE — ED Notes (Signed)
Pt refusing enema. Stating "I just want to go home." MD aware

## 2019-02-23 ENCOUNTER — Emergency Department (HOSPITAL_COMMUNITY): Payer: BC Managed Care – PPO

## 2019-02-23 DIAGNOSIS — Z0389 Encounter for observation for other suspected diseases and conditions ruled out: Secondary | ICD-10-CM | POA: Diagnosis not present

## 2019-02-23 MED ORDER — ALPRAZOLAM 0.25 MG PO TABS: 0.5000 mg | ORAL_TABLET | Freq: Every day | ORAL | Status: DC | PRN

## 2019-02-23 MED ORDER — IOHEXOL 300 MG/ML  SOLN
100.0000 mL | Freq: Once | INTRAMUSCULAR | Status: AC | PRN
  Administered 2019-02-23: 100 mL via INTRAVENOUS

## 2019-02-23 MED ORDER — OXYCODONE HCL 5 MG PO TABS: 15.0000 mg | ORAL_TABLET | Freq: Four times a day (QID) | ORAL | Status: DC | PRN

## 2019-02-23 MED ORDER — METOPROLOL SUCCINATE ER 25 MG PO TB24: 25.0000 mg | ORAL_TABLET | Freq: Every day | ORAL | Status: DC

## 2019-02-23 MED ORDER — AMLODIPINE BESYLATE 5 MG PO TABS: 5.0000 mg | ORAL_TABLET | Freq: Every day | ORAL | Status: DC

## 2019-02-23 MED ORDER — BUSPIRONE HCL 10 MG PO TABS: 20.0000 mg | ORAL_TABLET | Freq: Three times a day (TID) | ORAL | Status: DC

## 2019-02-23 NOTE — ED Notes (Signed)
Patient verbalizes understanding of discharge instructions. Opportunity for questioning and answers were provided. Armband removed by staff, pt discharged from ED ambulatory with neighbor to take home

## 2019-02-23 NOTE — BHH Counselor (Signed)
Clinician called Judson Roch, RN to set up TTS cart. Clinician asked RN a little about the pt. Per RN pt does not want to talk and is ready to go home. RN reported, she is going to talk to the doctor and called clinician on the next step.   Vertell Novak, MS, [facility], [facility] Triage Specialist [phone].

## 2019-02-23 NOTE — ED Provider Notes (Signed)
Plan at signout is to f/u on CT imaging and consult psych Pt otherwise stable at this time   Ripley Fraise, MD 02/23/19 0011

## 2019-02-23 NOTE — ED Provider Notes (Signed)
[facility] Provider Note   CSN: [id number] Arrival date & time: 02/22/19  1844    History   Chief Complaint Chief Complaint  Patient presents with  . Constipation  . Hernia  . Anxiety    HPI Brad [name] is a 63 y.o. male.     HPI  63 year old male with history of insomnia, depression, anxiety and known inguinal hernia comes in with chief complaint of abdominal pain, worst pain and anxiety.  Patient brought to the ER by his neighbor.  The neighbor states that patient has been calling him frequently over the past few days with nonspecific complaints.  When he goes to check on the patient patient is typically writhing in pain or complaining about his worries about not being able to pay bills.  The neighbor informed her that patient's wife has left him because patient for the last 2 or 3 months has been constantly worried about not being able to pay bills.  Patient does not sleep more than 2 hours at nighttime, and is fixated about his hernia but has not followed up with surgeons.  Patient states that he has been taking his medications as prescribed.  He was seen in the ER earlier in the month and admitted at [facility].  According the neighbor patient did not have any changes to his medications after being admitted at [facility] and he has seen no change at all in his behavior.  Patient's behavior has become so erratic, that the wife has left him.   Patient has no SI, HI.  He is complaining of wrist pain, leg pain, abdominal pain and constipation.  Patient's abdominal pain is in the lower quadrant by his inguinal hernia.  Patient states that he has not follow-up with general surgeons as requested, but unable to given a good reason why.  Past Medical History:  Diagnosis Date  . Allergy   . Anxiety   . Depression   . DJD (degenerative joint disease), cervical    postiton with pillow under knees, cant turn neck   . Dysentery, amebic, acute  1981  . GERD (gastroesophageal reflux disease)   . H/O bronchitis   . H/O malaria 1984  . Hearing loss    bilateral   . Hypertension    labile Blood pressure  . Inguinal hernia   . Insomnia    early morning awakening  . MVP (mitral valve prolapse)    "no problems"  . Perianal pain   . Personal history of colonic polyps   . Tinnitus    right ear    Patient Active Problem List   Diagnosis Date Noted  . Paresthesias 06/28/2018  . Right inguinal hernia 04/12/2014  . [facility]gu[facility] 09/10/2011    Past Surgical History:  Procedure Laterality Date  . CARPAL TUNNEL RELEASE  10/06, 5/10   right wrist   . CARPAL TUNNEL RELEASE  3/10   left wrist   . cervical spine discectomy   09/2005  . COLONOSCOPY WITH PROPOFOL N/A 10/21/2015   Procedure: COLONOSCOPY WITH PROPOFOL;  Surgeon: Charolett BumpersMartin K Johnson, MD;  [facility];  Service: Endoscopy;  Laterality: N/A;  . INGUINAL HERNIA REPAIR  08/16/2011   Procedure: HERNIA REPAIR INGUINAL ADULT;  Surgeon: Valarie MerinoMatthew B Martin, MD;  Location: [facility];  Service: General;  Laterality: Left;  . INGUINAL HERNIA REPAIR Right 05/03/2014   Procedure: OPEN RIGHT INGUINAL HERNIA REPAIR WITH MESH;  Surgeon: Susy FrizzleMatt  Daphine DeutscherMartin, MD;  Location: [facility];  Service: General;  Laterality: Right;  . INSERTION OF MESH Right 05/03/2014   Procedure: INSERTION OF MESH;  Surgeon: Wenda LowMatt Martin, MD;  Location: [facility];  Service: General;  Laterality: Right;  . TONSILLECTOMY  1960        Home Medications    Prior to Admission medications   Medication Sig Start Date End Date Taking? Authorizing Provider  ALPRAZolam (NIRAVAM) 0.5 MG dissolvable tablet Take 0.5 mg by mouth daily as needed for anxiety.     [provider]  amLODipine (NORVASC) 5 MG tablet Take 5 mg by mouth daily.    [provider]  busPIRone (BUSPAR) 10 MG tablet Take 20 mg by mouth 3 (three) times daily.     [provider]   Cholecalciferol (VITAMIN D-3) 5000 UNITS TABS Take 5,000 Units by mouth daily.     [provider]  co-enzyme Q-10 50 MG capsule Take 50 mg by mouth daily.     [provider]  glucosamine-chondroitin 500-400 MG tablet Take 1 tablet by mouth daily.     [provider]  hydrochlorothiazide (HYDRODIURIL) 12.5 MG tablet Take 12.5 mg by mouth daily. 01/08/19   [provider]  LORazepam (ATIVAN) 1 MG tablet Take 1 mg by mouth 2 (two) times a day. 10/26/18   [provider]  losartan (COZAAR) 100 MG tablet Take 100 mg by mouth daily.  04/28/18   [provider]  metoprolol succinate (TOPROL-XL) 25 MG 24 hr tablet Take 25 mg by mouth daily.    [provider]  Multiple Vitamin (MULTIVITAMIN WITH MINERALS) TABS tablet Take 1 tablet by mouth daily.    [provider]  Omega 3 1000 MG CAPS Take 2,000 mg by mouth daily.     [provider]  oxyCODONE (ROXICODONE) 15 MG immediate release tablet Take 15 mg by mouth every 6 (six) hours as needed for pain.    [provider]  pregabalin (LYRICA) 75 MG capsule Take 75 mg by mouth 2 (two) times daily.    [provider]  sildenafil (REVATIO) 20 MG tablet Take 60-100 mg by mouth daily as needed (for erectile dysfunction).     [provider]  zaleplon (SONATA) 10 MG capsule Take 10 mg by mouth at bedtime as needed (For early morning awakening.).     [provider]  Zinc 50 MG CAPS Take 50 mg by mouth daily.     [provider]  zolpidem (AMBIEN CR) 12.5 MG CR tablet Take 12.5 mg by mouth at bedtime.     [provider]    Family History Family History  Problem Relation Age of Onset  . Cancer Mother        breast  . Intracerebral hemorrhage Father     Social History Social History   Tobacco Use  . Smoking status: Never Smoker  . Smokeless tobacco: Never Used  Substance Use Topics  . Alcohol use: Yes    Comment: 2 wine  daily  . Drug use: Yes    Types: Marijuana    Comment: weekend use     Allergies   Gabapentin and Trileptal [oxcarbazepine]   Review of Systems Review of Systems  Constitutional: Positive for activity change.  Gastrointestinal: Positive for abdominal pain.  Musculoskeletal: Positive for arthralgias.  Psychiatric/Behavioral: Negative for suicidal ideas. The patient is nervous/anxious and is hyperactive.   All other systems reviewed and are negative.    Physical  Exam Updated Vital Signs BP (!) 142/96   Pulse 69   Temp 98.6 F (37 C) (Oral)   Resp 15   SpO2 100%   Physical Exam Vitals signs and nursing note reviewed.  Constitutional:      Appearance: He is well-developed.  HENT:     Head: Atraumatic.  Neck:     Musculoskeletal: Neck supple.  Cardiovascular:     Rate and Rhythm: Normal rate.  Pulmonary:     Effort: Pulmonary effort is normal.  Skin:    General: Skin is warm.  Neurological:     Mental Status: He is alert and oriented to person, place, and time.  Psychiatric:     Comments: Tangential thinking, anxious appearing, needs frequent redirection      ED Treatments / Results  Labs (all labs ordered are listed, but only abnormal results are displayed) Labs Reviewed  COMPREHENSIVE METABOLIC PANEL - Abnormal; Notable for the following components:      Result Value   Sodium 129 (*)    Chloride 96 (*)    Glucose, Bld 103 (*)    All other components within normal limits  CBC - Abnormal; Notable for the following components:   HCT 38.1 (*)    All other components within normal limits  RAPID URINE DRUG SCREEN, HOSP PERFORMED - Abnormal; Notable for the following components:   Benzodiazepines POSITIVE (*)    All other components within normal limits  ETHANOL    EKG None  Radiology Dg Wrist Complete Right  Result Date: 02/22/2019 CLINICAL DATA:  Right wrist pain. EXAM: RIGHT WRIST - COMPLETE 3+ VIEW COMPARISON:  None. FINDINGS: There is no  evidence of fracture or dislocation. There is no evidence of arthropathy or other focal bone abnormality. Soft tissues are unremarkable. IMPRESSION: Negative. Electronically Signed   By: Constance Holster M.D.   On: 02/22/2019 19:39    Procedures Procedures (including critical care time)  Medications Ordered in ED Medications  milk and molasses enema (has no administration in time range)  oxyCODONE (OXYCONTIN) 12 hr tablet 15 mg (15 mg Oral Given 02/22/19 2358)     Initial Impression / Assessment and Plan / ED Course  I have reviewed the triage vital signs and the nursing notes.  Pertinent labs & imaging results that were available during my care of the patient were reviewed by me and considered in my medical decision making (see chart for details).       63 year old male comes in a chief complaint of abdominal pain.  His neighbor is primarily concerned about patient's psychiatric wellbeing.  Patient has no SI, HI but he clearly appears to be decompensated from psychiatry perspective.  It appears that his symptoms of anxiety have prevented him from sleeping well and he is fixated on things like his bills, cleaning the house, abdominal discomfort.  Patient's wife has left him because of his mental status.  Patient was recently admitted at [facility], however it seems like there was no improvement in his symptoms per the neighbor.  On exam he does have an inguinal hernia.  He is quite tender in that area but the hernia also appears to be reducible.  Given the severe pain, which often leads to him writhing in pain, CT abdomen and pelvis ordered.  We will also rule out small bowel obstruction.  Dr. Christy Gentles will follow-up on the CT scan. If the CT is negative patient will be medically cleared for psychiatric evaluation.  Patient does not meet IVC  criteria as he is not an imminent danger to himself or anyone else.  Patient also is not in acute psychiatry crisis.   Final Clinical  Impressions(s) / ED Diagnoses   Final diagnoses:  Anxiety  Hernia of abdominal cavity    ED Discharge Orders    None       Derwood KaplanNanavati, Nichola Cieslinski, MD 02/23/19 [phone]

## 2019-02-23 NOTE — ED Notes (Signed)
Patient transported to CT 

## 2019-02-23 NOTE — ED Notes (Signed)
Pt refusing discharge vitals.

## 2019-02-23 NOTE — ED Notes (Signed)
Pt refusing to stay. Stating he wants to leave right now. MD aware

## 2019-02-23 NOTE — ED Provider Notes (Signed)
Patient now requesting psychiatric consultation.  Discharge has been canceled and will consult psychiatry   Ripley Fraise, MD 02/23/19 [phone]

## 2019-02-23 NOTE — BH Assessment (Addendum)
Tele Assessment Note   Patient Name: Brad [name]  MRN: [id number] Referring Physician: Dr. Zadie Rhineonald [name]. [facility] [phone]. Location of Provider: Behavioral Health TTS Department  Brad [name] is an 63 y.o. male, who presents voluntary and [facility] Per nurse the pt wanted his friend present during the assessment. Throughout the assessment the pt would say he's hungry (a sandwich/drink was in front of him), he's tired, and he feels like he was going to pass out. Clinician asked the pt, "what brought you to the hospital?" Pt's friend reported, the pt was incoherent, unable to take care of himself, his wife left him and he's going home to an empty house. Pt's friend reported, the pt has behavioral anxiety, thinks his wrist is broken but he has not fracture. Pt then wanted his friend to leave the assessment. Pt told staff she could not obtain additional collateral information from his friend. Pt reported, he follows his wife around the house, it got to be too much for her, she left but she's coming back. Pt reported, he depends on his wife a lot, he doesn't cook, etc. Pt then reported, his wife depends on him. Pt reported, hernia pain and he thought he broke his wrist by slamming his hand on table. Pt reported, he is going to make an appointment to a hand specialist, a surgeon for his hernia. Pt denies, SI, HI, AVH, self-injurious, and access to weapons.   Pt reported, he was bullied when he was in the 9th grade. Pt's UDS is positive for benzodiazepines. Pt's reported, he is linked to Brad [name] for medication management. Pt reported, he is prescribed: Lorazepam, Buspar and Xanax (PRN). Pt reported, he has an appointment with his psychiatrist on Monday (02/26/2019). Pt reported, he started seeing a new therapist last week. Per chart, pt has previous inpatient admissions to [facility].   Pt presents alert in scrubs with logical, coherent speech. Pt's mood,  affect was anxious. Pt's thought process was coherent, relevant. Pt's judgement was partial. Pt was oriented x4. Pt's concentration, insight and impulse control was fair. Pt reported, [facility] he could contract for safety. Pt reported, he is ready to go home.  Diagnosis: Generalized Anxiety Disorder.   Past Medical History:  Past Medical History:  Diagnosis Date  . Allergy   . Anxiety   . Depression   . DJD (degenerative joint disease), cervical    postiton with pillow under knees, cant turn neck   . Dysentery, amebic, acute 1981  . GERD (gastroesophageal reflux disease)   . H/O bronchitis   . H/O malaria 1984  . Hearing loss    bilateral   . Hypertension    labile Blood pressure  . Inguinal hernia   . Insomnia    early morning awakening  . MVP (mitral valve prolapse)    "no problems"  . Perianal pain   . Personal history of colonic polyps   . Tinnitus    right ear    Past Surgical History:  Procedure Laterality Date  . CARPAL TUNNEL RELEASE  10/06, 5/10   right wrist   . CARPAL TUNNEL RELEASE  3/10   left wrist   . cervical spine discectomy   09/2005  . COLONOSCOPY WITH PROPOFOL N/A 10/21/2015   Procedure: COLONOSCOPY WITH PROPOFOL;  Surgeon: Brad BumpersMartin K Johnson, MD;  [facility];  Service: Endoscopy;  Laterality: N/A;  . INGUINAL HERNIA REPAIR  08/16/2011  Procedure: HERNIA REPAIR INGUINAL ADULT;  Surgeon: Brad Earls, MD;  Location: [place];  Service: General;  Laterality: Left;  . INGUINAL HERNIA REPAIR Right 05/03/2014   Procedure: OPEN RIGHT INGUINAL HERNIA REPAIR WITH MESH;  Surgeon: Brad Lim, MD;  [facility];  Service: General;  Laterality: Right;  . INSERTION OF MESH Right 05/03/2014   Procedure: INSERTION OF MESH;  Surgeon: Brad Lim, MD;  [facility];  Service: General;  Laterality: Right;  . TONSILLECTOMY  1960    Family History:  Family History  Problem Relation Age of Onset  . Cancer Mother         breast  . Intracerebral hemorrhage Father     Social History:  reports that he has never smoked. He has never used smokeless tobacco. He reports current alcohol use. He reports current drug use. Drug: Marijuana.  Additional Social History:  Alcohol / Drug Use Pain Medications: See MAR Prescriptions: See MAR Over the Counter: See MAR History of alcohol / drug use?: Yes Substance #1 Name of Substance 1: Benzodiazepines. 1 - Age of First Use: UTA 1 - Amount (size/oz): Pt's UDS is positive for benzodiazepines. 1 - Frequency: UTA 1 - Duration: UTA 1 - Last Use / Amount: UTA  CIWA: CIWA-Ar BP: (!) 142/96 Pulse Rate: 69 COWS:    Allergies:  Allergies  Allergen Reactions  . Gabapentin Other (See Comments)    Unable to urinate, drowsiness  . Trileptal [Oxcarbazepine] Other (See Comments)    Bad taste in his mouth    Home Medications: (Not in a hospital admission)   OB/GYN Status:  No LMP for male patient.  General Assessment Data Location of Assessment: [facility] ED TTS Assessment: In system Is this a Tele or Face-to-Face Assessment?: Tele Assessment Is this an Initial Assessment or a Re-assessment for this encounter?: Initial Assessment Patient Accompanied by:: Adult Permission Given to speak with another: Yes(Then pt did not want the neighbor involved in assessment.) Name, Relationship and Phone Number: Brad [name].  Language Other than English: No Living Arrangements: Other (Comment)(Wife. ) What gender do you identify as?: Male Marital status: Married Living Arrangements: Spouse/significant other Can pt return to current living arrangement?: Yes Admission Status: Voluntary Is patient capable of signing voluntary admission?: Yes Referral Source: Self/Family/Friend Insurance type: BCBS     Crisis Care Plan Living Arrangements: Spouse/significant other Legal Guardian: Other:(Self. ) Name of Psychiatrist: [name] Name of Therapist: Seen new counseslor last  week.  Education Status Is patient currently in school?: No Is the patient employed, unemployed or receiving disability?: Unemployed(Per chart. )  Risk to self with the past 6 months Suicidal Ideation: No(Pt denies. ) Has patient been a risk to self within the past 6 months prior to admission? : No Suicidal Intent: No Has patient had any suicidal intent within the past 6 months prior to admission? : No Is patient at risk for suicide?: No Suicidal Plan?: No(Pt denies. ) Has patient had any suicidal plan within the past 6 months prior to admission? : No Access to Means: No What has been your use of drugs/alcohol within the last 12 months?: Benzodiazepines.  Previous Attempts/Gestures: No(Pt denies. ) How many times?: 0 Other Self Harm Risks: NA Triggers for Past Attempts: None known Intentional Self Injurious Behavior: None(Pt denies. ) Family Suicide History: No Recent stressful life event(s): Financial Problems(Per chart. ) Persecutory voices/beliefs?: No Depression: Yes Depression Symptoms: Feeling angry/irritable, Feeling worthless/self pity, Isolating, Fatigue Substance abuse history and/or treatment  for substance abuse?: Yes(Per chart. ) Suicide prevention information given to non-admitted patients: Not applicable  Risk to Others within the past 6 months Homicidal Ideation: No(Pt denies. ) Does patient have any lifetime risk of violence toward others beyond the six months prior to admission? : No Thoughts of Harm to Others: No(Pt denies. ) Current Homicidal Intent: No Current Homicidal Plan: No Access to Homicidal Means: No Identified Victim: NA History of harm to others?: No Assessment of Violence: None Noted Violent Behavior Description: NA Does patient have access to weapons?: No(Pt denies. ) Criminal Charges Pending?: No Does patient have a court date: No Is patient on probation?: No  Psychosis Hallucinations: None noted Delusions: None noted  Mental Status  Report Appearance/Hygiene: Unremarkable Eye Contact: Poor Motor Activity: Restlessness Speech: Logical/coherent Level of Consciousness: Alert Mood: Anxious Affect: Anxious Anxiety Level: Moderate Thought Processes: Coherent, Relevant Judgement: Partial Orientation: Person, Place, Time, Situation Obsessive Compulsive Thoughts/Behaviors: Moderate  Cognitive Functioning Concentration: Fair Memory: Recent Intact Is patient IDD: No Insight: Fair Impulse Control: Fair Appetite: Poor Have you had any weight changes? : Loss Amount of the weight change? (lbs): 20 lbs(pounds in three months. ) Sleep: Decreased Vegetative Symptoms: None  ADLScreening [facility] Assessment Services) Patient's cognitive ability adequate to safely complete daily activities?: Yes Patient able to express need for assistance with ADLs?: Yes Independently performs ADLs?: Yes (appropriate for developmental age)  Prior Inpatient Therapy Prior Inpatient Therapy: No  Prior Outpatient Therapy Prior Outpatient Therapy: Yes Prior Therapy Dates: Current. Prior Therapy Facilty/Provider(s): Brad [name]. Reason for Treatment: Medication management.  Does patient have an ACCT team?: No Does patient have Intensive In-House Services?  : No Does patient have [facility] services? : No Does patient have P4CC services?: No  ADL Screening (condition at time of admission) Patient's cognitive ability adequate to safely complete daily activities?: Yes Is the patient deaf or have difficulty hearing?: No Does the patient have difficulty seeing, even when wearing glasses/contacts?: Yes(Pt reports, using reading glasses.) Does the patient have difficulty concentrating, remembering, or making decisions?: Yes Patient able to express need for assistance with ADLs?: Yes Does the patient have difficulty dressing or bathing?: No Independently performs ADLs?: Yes (appropriate for developmental age) Does the patient have difficulty walking  or climbing stairs?: No Weakness of Legs: None(Pt reported, back pain, hernia pain, foot pain and wrist pain.) Weakness of Arms/Hands: None  Home Assistive Devices/Equipment Home Assistive Devices/Equipment: Eyeglasses(Reading glasses.)    Abuse/Neglect Assessment (Assessment to be complete while patient is alone) Abuse/Neglect Assessment Can Be Completed: Yes Physical Abuse: Denies(Pt denies.) Verbal Abuse: Yes, past (Comment)(Pt reported, he was bullied in 9th grade.) Sexual Abuse: Denies(Pt denies.) Exploitation of patient/patient's resources: Denies(Pt denies.) Self-Neglect: Denies(Pt denies.)     Advance Directives (For Healthcare) Does Patient Have a Medical Advance Directive?: No Would patient like information on creating a medical advance directive?: No - Patient declined         Disposition: Nira ConnJason Berry, NP recommends pt to be reassessed by psychiatry. Pt to wants to leave, discussed with Dr. Bebe ShaggyWickline. [facility]    Disposition Initial Assessment Completed for this Encounter: Yes  This service was provided via telemedicine using a 2-way, interactive audio and video technology.  Names of all persons participating in this telemedicine service and their role in this encounter. Name: Brad [name]. Role: Patient.   Name: Molli HazardMatthew.  Role: Neighbor.Friend.  Name: Redmond Pullingreylese D Tico Crotteau, MS, [facility], [facility]. Role: Counselor.       Caprice Renshawreylese  D Huey Scalia 02/23/2019 6:29 AM    Redmond Pullingreylese D Forrest Jaroszewski, MS, [facility], [facility] Triage Specialist [phone]

## 2019-02-23 NOTE — ED Provider Notes (Signed)
Seen by psych Plan to reassess in morning Pt refusing to stay and requesting d/c   Ripley Fraise, MD 02/23/19 [phone]

## 2019-02-23 NOTE — Discharge Instructions (Addendum)
°  SEEK IMMEDIATE MEDICAL ATTENTION IF: °The pain does not go away or becomes severe, particularly over the next 8-12 hours.  °A temperature above 100.4F develops.  °Repeated vomiting occurs (multiple episodes).  ° °Blood is being passed in stools or vomit (bright red or black tarry stools).  °Return also if you develop chest pain, difficulty breathing, dizziness or fainting, or become confused, poorly responsive, or inconsolable. ° °

## 2019-02-23 NOTE — ED Notes (Signed)
Pt increasingly anxious and stating he wants to leave. Informed of plan of care and pt continuously going back and forth on whether or not he wants to leave.

## 2019-02-23 NOTE — ED Provider Notes (Signed)
CT scan is negative. Patient is very anxious, pacing around the room.  He declines further work-up, would like to be discharged home. He denies SI, he has no access to weapons.  He reports he has a psychiatry appointment on June 29 Patient prefers to be discharged. Patient reports "I got more than I bargained for, I came here for wrist pain "    Ripley Fraise, MD 02/23/19 [phone]

## 2019-02-26 ENCOUNTER — Emergency Department (HOSPITAL_COMMUNITY)
Admission: EM | Admit: 2019-02-26 | Discharge: 2019-02-27 | Disposition: A | Discharge status: Other Acute Inpatient Hospital | Payer: BC Managed Care – PPO | Source: Home / Self Care | Attending: Emergency Medicine | Admitting: Emergency Medicine

## 2019-02-26 ENCOUNTER — Encounter (HOSPITAL_COMMUNITY): Payer: Self-pay | Admitting: Emergency Medicine

## 2019-02-26 ENCOUNTER — Emergency Department (HOSPITAL_COMMUNITY): Payer: BC Managed Care – PPO

## 2019-02-26 ENCOUNTER — Other Ambulatory Visit: Payer: Self-pay

## 2019-02-26 DIAGNOSIS — G4700 Insomnia, unspecified: Secondary | ICD-10-CM | POA: Diagnosis not present

## 2019-02-26 DIAGNOSIS — F1290 Cannabis use, unspecified, uncomplicated: Secondary | ICD-10-CM | POA: Insufficient documentation

## 2019-02-26 DIAGNOSIS — Z03818 Encounter for observation for suspected exposure to other biological agents ruled out: Secondary | ICD-10-CM | POA: Diagnosis not present

## 2019-02-26 DIAGNOSIS — I10 Essential (primary) hypertension: Secondary | ICD-10-CM | POA: Insufficient documentation

## 2019-02-26 DIAGNOSIS — R569 Unspecified convulsions: Secondary | ICD-10-CM

## 2019-02-26 DIAGNOSIS — F3341 Major depressive disorder, recurrent, in partial remission: Secondary | ICD-10-CM | POA: Diagnosis not present

## 2019-02-26 DIAGNOSIS — W19XXXA Unspecified fall, initial encounter: Secondary | ICD-10-CM

## 2019-02-26 DIAGNOSIS — Z1159 Encounter for screening for other viral diseases: Secondary | ICD-10-CM | POA: Diagnosis not present

## 2019-02-26 DIAGNOSIS — R4689 Other symptoms and signs involving appearance and behavior: Secondary | ICD-10-CM

## 2019-02-26 DIAGNOSIS — W109XXA Fall (on) (from) unspecified stairs and steps, initial encounter: Secondary | ICD-10-CM | POA: Insufficient documentation

## 2019-02-26 DIAGNOSIS — I4510 Unspecified right bundle-branch block: Secondary | ICD-10-CM | POA: Diagnosis not present

## 2019-02-26 DIAGNOSIS — R4182 Altered mental status, unspecified: Secondary | ICD-10-CM | POA: Diagnosis not present

## 2019-02-26 DIAGNOSIS — Z79899 Other long term (current) drug therapy: Secondary | ICD-10-CM | POA: Insufficient documentation

## 2019-02-26 DIAGNOSIS — Z20828 Contact with and (suspected) exposure to other viral communicable diseases: Secondary | ICD-10-CM | POA: Insufficient documentation

## 2019-02-26 DIAGNOSIS — R410 Disorientation, unspecified: Secondary | ICD-10-CM | POA: Diagnosis not present

## 2019-02-26 DIAGNOSIS — R52 Pain, unspecified: Secondary | ICD-10-CM | POA: Diagnosis not present

## 2019-02-26 DIAGNOSIS — F29 Unspecified psychosis not due to a substance or known physiological condition: Secondary | ICD-10-CM | POA: Diagnosis not present

## 2019-02-26 DIAGNOSIS — F323 Major depressive disorder, single episode, severe with psychotic features: Secondary | ICD-10-CM | POA: Diagnosis not present

## 2019-02-26 DIAGNOSIS — F0280 Dementia in other diseases classified elsewhere without behavioral disturbance: Secondary | ICD-10-CM | POA: Diagnosis not present

## 2019-02-26 DIAGNOSIS — F329 Major depressive disorder, single episode, unspecified: Secondary | ICD-10-CM | POA: Insufficient documentation

## 2019-02-26 LAB — CBC WITH DIFFERENTIAL/PLATELET
Abs Immature Granulocytes: 0.04 10*3/uL (ref 0.00–0.07)
Basophils Absolute: 0 10*3/uL (ref 0.0–0.1)
Basophils Relative: 0 %
Eosinophils Absolute: 0 10*3/uL (ref 0.0–0.5)
Eosinophils Relative: 0 %
HCT: 36.5 % — ABNORMAL LOW (ref 39.0–52.0)
Hemoglobin: 13 g/dL (ref 13.0–17.0)
Immature Granulocytes: 0 %
Lymphocytes Relative: 13 %
Lymphs Abs: 1.2 10*3/uL (ref 0.7–4.0)
MCH: 31.3 pg (ref 26.0–34.0)
MCHC: 35.6 g/dL (ref 30.0–36.0)
MCV: 88 fL (ref 80.0–100.0)
Monocytes Absolute: 0.7 10*3/uL (ref 0.1–1.0)
Monocytes Relative: 8 %
Neutro Abs: 7.3 10*3/uL (ref 1.7–7.7)
Neutrophils Relative %: 79 %
Platelets: 186 10*3/uL (ref 150–400)
RBC: 4.15 MIL/uL — ABNORMAL LOW (ref 4.22–5.81)
RDW: 12 % (ref 11.5–15.5)
WBC: 9.2 10*3/uL (ref 4.0–10.5)
nRBC: 0 % (ref 0.0–0.2)

## 2019-02-26 LAB — SARS CORONAVIRUS 2 BY RT PCR (HOSPITAL ORDER, PERFORMED IN ~~LOC~~ HOSPITAL LAB): SARS Coronavirus 2: NEGATIVE

## 2019-02-26 LAB — COMPREHENSIVE METABOLIC PANEL
ALT: 31 U/L (ref 0–44)
AST: 49 U/L — ABNORMAL HIGH (ref 15–41)
Albumin: 3.8 g/dL (ref 3.5–5.0)
Alkaline Phosphatase: 75 U/L (ref 38–126)
Anion gap: 11 (ref 5–15)
BUN: 24 mg/dL — ABNORMAL HIGH (ref 8–23)
CO2: 22 mmol/L (ref 22–32)
Calcium: 9.2 mg/dL (ref 8.9–10.3)
Chloride: 95 mmol/L — ABNORMAL LOW (ref 98–111)
Creatinine, Ser: 1.07 mg/dL (ref 0.61–1.24)
GFR calc Af Amer: >60 mL/min (ref >60)
GFR calc non Af Amer: >60 mL/min (ref >60)
Glucose, Bld: 94 mg/dL (ref 70–99)
Potassium: 3.2 mmol/L — ABNORMAL LOW (ref 3.5–5.1)
Sodium: 128 mmol/L — ABNORMAL LOW (ref 135–145)
Total Bilirubin: 1.6 mg/dL — ABNORMAL HIGH (ref 0.3–1.2)
Total Protein: 6.2 g/dL — ABNORMAL LOW (ref 6.5–8.1)

## 2019-02-26 LAB — MAGNESIUM: Magnesium: 2.4 mg/dL (ref 1.7–2.4)

## 2019-02-26 LAB — CBG MONITORING, ED: Glucose-Capillary: 78 mg/dL (ref 70–99)

## 2019-02-26 LAB — RAPID URINE DRUG SCREEN, HOSP PERFORMED
Amphetamines: NOT DETECTED
Barbiturates: NOT DETECTED
Benzodiazepines: POSITIVE — AB
Cocaine: NOT DETECTED
Opiates: NOT DETECTED
Tetrahydrocannabinol: NOT DETECTED

## 2019-02-26 LAB — ETHANOL: Alcohol, Ethyl (B): <10 mg/dL (ref <10)

## 2019-02-26 LAB — ACETAMINOPHEN LEVEL: Acetaminophen (Tylenol), Serum: <10 ug/mL — ABNORMAL LOW (ref 10–30)

## 2019-02-26 LAB — SALICYLATE LEVEL: Salicylate Lvl: <7 mg/dL (ref 2.8–30.0)

## 2019-02-26 MED ORDER — METOPROLOL SUCCINATE ER 25 MG PO TB24
25.0000 mg | ORAL_TABLET | Freq: Every day | ORAL | Status: DC
  Administered 2019-02-27: 10:00:00 25 mg via ORAL
  Filled 2019-02-26: qty 1

## 2019-02-26 MED ORDER — AMLODIPINE BESYLATE 5 MG PO TABS: 5.0000 mg | ORAL_TABLET | Freq: Every day | ORAL | Status: DC

## 2019-02-26 MED ORDER — METOPROLOL SUCCINATE ER 25 MG PO TB24: 25.0000 mg | ORAL_TABLET | Freq: Every day | ORAL | Status: DC

## 2019-02-26 MED ORDER — LORAZEPAM 1 MG PO TABS
1.0000 mg | ORAL_TABLET | Freq: Two times a day (BID) | ORAL | Status: DC
  Administered 2019-02-27: 10:00:00 1 mg via ORAL
  Filled 2019-02-26 (×2): qty 1

## 2019-02-26 MED ORDER — POTASSIUM CHLORIDE CRYS ER 20 MEQ PO TBCR
40.0000 meq | EXTENDED_RELEASE_TABLET | Freq: Once | ORAL | Status: AC
  Administered 2019-02-26: 22:00:00 40 meq via ORAL
  Filled 2019-02-26: qty 2

## 2019-02-26 MED ORDER — ZOLPIDEM TARTRATE 5 MG PO TABS
5.0000 mg | ORAL_TABLET | Freq: Every evening | ORAL | Status: DC | PRN
  Administered 2019-02-27: 5 mg via ORAL
  Filled 2019-02-26: qty 1

## 2019-02-26 MED ORDER — ALPRAZOLAM 0.25 MG PO TABS
0.5000 mg | ORAL_TABLET | Freq: Every day | ORAL | Status: DC | PRN
  Filled 2019-02-26: qty 2

## 2019-02-26 MED ORDER — LOSARTAN POTASSIUM 50 MG PO TABS: 100.0000 mg | ORAL_TABLET | Freq: Every day | ORAL | Status: DC

## 2019-02-26 MED ORDER — HYDROCHLOROTHIAZIDE 25 MG PO TABS: 12.5000 mg | ORAL_TABLET | Freq: Every day | ORAL | Status: DC

## 2019-02-26 MED ORDER — LORAZEPAM 1 MG PO TABS
1.0000 mg | ORAL_TABLET | Freq: Once | ORAL | Status: AC
  Administered 2019-02-26: 20:00:00 1 mg via ORAL
  Filled 2019-02-26: qty 1

## 2019-02-26 MED ORDER — BUSPIRONE HCL 10 MG PO TABS
20.0000 mg | ORAL_TABLET | Freq: Three times a day (TID) | ORAL | Status: DC
  Administered 2019-02-27 (×2): 20 mg via ORAL
  Filled 2019-02-26 (×3): qty 2

## 2019-02-26 MED ORDER — SODIUM CHLORIDE 0.9 % IV BOLUS
500.0000 mL | Freq: Once | INTRAVENOUS | Status: AC
  Administered 2019-02-26: 22:00:00 500 mL via INTRAVENOUS

## 2019-02-26 MED ORDER — AMLODIPINE BESYLATE 5 MG PO TABS
5.0000 mg | ORAL_TABLET | Freq: Every day | ORAL | Status: DC
  Administered 2019-02-27: 10:00:00 5 mg via ORAL
  Filled 2019-02-26: qty 1

## 2019-02-26 NOTE — ED Provider Notes (Addendum)
[facility] Provider Note   CSN: [id number] Arrival date & time: 02/26/19  1654     History   Chief Complaint No chief complaint on file.   HPI Everardo AllJohn C Burnstein is a 63 y.o. male.     HPI  Patient is a 63 year old male with a past medical history of anxiety, depression, insomnia, mitral valve prolapse, degenerative disc disease presenting after his family called EMS for an episode of convulsing.  Patient has been seen multiple times in the past month for several issues.  Patient reports that his main problem today is he had difficulty breathing when he was in a room in his house surrounded by "white powder".  He does not think he actually fell, had a seizure or hit his head.  He denies any loss of consciousness.  He also reports weight loss which he believes is due to "his hernia".  Collateral information obtained from patient's son Clayburn Pertvan, who was in the presence of the patient's wife, who is mostly non-English-speaking.  He states that the patient fell down approximately 5-6 steps today, they believe he hit his head.  They thought they saw him convulsing for 30 seconds.  He has no history of seizures.  They did not know if he stopped any of his medications voluntarily such as his benzodiazepines.  Prior to this, they report that his mental state has been deteriorating and that his personal psychiatrist was aware of his episode today and recommended he come to the emergency department.  According to them, the primary psychiatrist, Dr. Donell BeersPlovsky will be in touch with the emergency psychiatry team.  They report that patient has been on a 7131-month decline and is extremely paranoid.  He has expressed others at times that he wants to commit suicide however has not made any acute mentions of this today.  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  He is normally very high functioning.  Patient reports to this writer that he has not been practicing in  some time.  Past Medical History:  Diagnosis Date   Allergy    Anxiety    Depression    DJD (degenerative joint disease), cervical    postiton with pillow under knees, cant turn neck    Dysentery, amebic, acute 1981   GERD (gastroesophageal reflux disease)    H/O bronchitis    H/O malaria 1984   Hearing loss    bilateral    Hypertension    labile Blood pressure   Inguinal hernia    Insomnia    early morning awakening   MVP (mitral valve prolapse)    "no problems"   Perianal pain    Personal history of colonic polyps    Tinnitus    right ear    Patient Active Problem List   Diagnosis Date Noted   Paresthesias 06/28/2018   Right inguinal hernia 04/12/2014   [facility]    Past Surgical History:  Procedure Laterality Date   CARPAL TUNNEL RELEASE  10/06, 5/10   right wrist    CARPAL TUNNEL RELEASE  3/10   left wrist    cervical spine discectomy   09/2005   COLONOSCOPY WITH PROPOFOL N/A 10/21/2015   Procedure: COLONOSCOPY WITH PROPOFOL;  Surgeon: Charolett BumpersMartin K Johnson, MD;  Location: [facility];  Service: Endoscopy;  Laterality: N/A;   INGUINAL HERNIA REPAIR  08/16/2011   Procedure: HERNIA REPAIR INGUINAL ADULT;  Surgeon: Valarie MerinoMatthew B Martin, MD;  Location: [facility];  Service: General;  Laterality: Left;   INGUINAL HERNIA REPAIR Right 05/03/2014   Procedure: OPEN RIGHT INGUINAL HERNIA REPAIR WITH MESH;  Surgeon: Wenda LowMatt Martin, MD;  Location: [facility];  Service: General;  Laterality: Right;   INSERTION OF MESH Right 05/03/2014   Procedure: INSERTION OF MESH;  Surgeon: Wenda LowMatt Martin, MD;  Location: [facility];  Service: General;  Laterality: Right;   TONSILLECTOMY  1960        Home Medications    Prior to Admission medications   Medication Sig Start Date End Date Taking? Authorizing Provider  ALPRAZolam (NIRAVAM) 0.5 MG dissolvable tablet Take 0.5 mg by mouth daily as needed for anxiety.     [provider]  amLODipine (NORVASC) 5 MG tablet Take 5 mg by mouth daily.    [provider]  busPIRone (BUSPAR) 10 MG tablet Take 20 mg by mouth 3 (three) times daily.     [provider]  hydrochlorothiazide (HYDRODIURIL) 12.5 MG tablet Take 12.5 mg by mouth daily. 01/08/19   [provider]  LORazepam (ATIVAN) 1 MG tablet Take 1 mg by mouth 2 (two) times a day. 10/26/18   [provider]  losartan (COZAAR) 100 MG tablet Take 100 mg by mouth daily.  04/28/18   [provider]  metoprolol succinate (TOPROL-XL) 25 MG 24 hr tablet Take 25 mg by mouth daily.    [provider]  oxyCODONE (ROXICODONE) 15 MG immediate release tablet Take 15 mg by mouth every 6 (six) hours as needed for pain.    [provider]  sildenafil (REVATIO) 20 MG tablet Take 60-100 mg by mouth daily as needed (for erectile dysfunction).     [provider]  zaleplon (SONATA) 10 MG capsule Take 10 mg by mouth at bedtime as needed (For early morning awakening.).     [provider]  zolpidem (AMBIEN CR) 12.5 MG CR tablet Take 12.5 mg by mouth at bedtime.     [provider]    Family History Family History  Problem Relation Age of Onset   Cancer Mother        breast   Intracerebral hemorrhage Father     Social History Social History   Tobacco Use   Smoking status: Never Smoker   Smokeless tobacco: Never Used  Substance Use Topics   Alcohol use: Yes    Comment: 2 wine daily   Drug use: Yes    Types: Marijuana    Comment: weekend use     Allergies   Gabapentin and Trileptal [oxcarbazepine]   Review of Systems Review of Systems  Constitutional: Negative for chills and fever.  HENT: Negative for congestion and rhinorrhea.   Eyes: Negative for visual disturbance.  Respiratory: Negative for cough and shortness of breath.   Cardiovascular: Negative for chest pain.  Gastrointestinal: Negative for abdominal pain,  nausea and vomiting.  Neurological: Negative for syncope, speech difficulty, weakness and headaches.  Psychiatric/Behavioral: Positive for agitation, dysphoric mood and sleep disturbance. Negative for hallucinations and self-injury. The patient is nervous/anxious and is hyperactive.   All other systems reviewed and are negative.    Physical Exam Updated Vital Signs BP (!) 136/92    Pulse 80    Temp 99.2 F (37.3 C) (Oral)    Resp (!) 22    SpO2 100%   Physical Exam Vitals signs and nursing note reviewed.  Constitutional:      General: He is not in acute distress.  Appearance: He is well-developed. He is not ill-appearing.     Comments: Thin appearing male. Anxious appearing.   HENT:     Head: Normocephalic and atraumatic.     Mouth/Throat:     Mouth: Mucous membranes are moist.  Eyes:     Conjunctiva/sclera: Conjunctivae normal.     Pupils: Pupils are equal, round, and reactive to light.  Neck:     Musculoskeletal: Normal range of motion and neck supple.  Cardiovascular:     Rate and Rhythm: Normal rate and regular rhythm.     Heart sounds: S1 normal and S2 normal. No murmur.  Pulmonary:     Effort: Pulmonary effort is normal.     Breath sounds: Normal breath sounds. No wheezing or rales.  Abdominal:     General: There is no distension.     Palpations: Abdomen is soft.     Tenderness: There is no abdominal tenderness. There is no guarding.  Musculoskeletal: Normal range of motion.        General: No deformity.  Lymphadenopathy:     Cervical: No cervical adenopathy.  Skin:    General: Skin is warm and dry.     Findings: No erythema or rash.  Neurological:     General: No focal deficit present.     Mental Status: He is alert and oriented to person, place, and time.     Comments: Mental Status:  Alert, oriented, thought content appropriate, able to give a coherent history. Speech fluent without evidence of aphasia. Able to follow 2 step commands without difficulty.    Cranial Nerves:  II:  Peripheral visual fields grossly normal, pupils equal, round, reactive to light III,IV, VI: ptosis not present, extra-ocular motions intact bilaterally  V,VII: smile symmetric, facial light touch sensation equal VIII: hearing grossly normal to voice  X: uvula elevates symmetrically  XI: bilateral shoulder shrug symmetric and strong XII: midline tongue extension without fassiculations Patient moves extremities symmetrically and with good coordination. Strength 5/5 in upper and lower extremities.   Psychiatric:     Comments: Patient is alert and oriented x3.  He appears anxious and agitated with flight of ideas.  He has difficulty completing one thought.  He is very sensitive to particular language that providers use, and frequently corrects them for the use of "ok" or other affirmative phrases which he feels to be dismissive.  Does not appear to be responding to internal stimuli.      ED Treatments / Results  Labs (all labs ordered are listed, but only abnormal results are displayed) Labs Reviewed  CBC WITH DIFFERENTIAL/PLATELET - Abnormal; Notable for the following components:      Result Value   RBC 4.15 (*)    HCT 36.5 (*)    All other components within normal limits  COMPREHENSIVE METABOLIC PANEL - Abnormal; Notable for the following components:   Sodium 128 (*)    Potassium 3.2 (*)    Chloride 95 (*)    BUN 24 (*)    Total Protein 6.2 (*)    AST 49 (*)    Total Bilirubin 1.6 (*)    All other components within normal limits  MAGNESIUM  ETHANOL  RAPID URINE DRUG SCREEN, HOSP PERFORMED  SALICYLATE LEVEL  ACETAMINOPHEN LEVEL  CBG MONITORING, ED    EKG    EKG Interpretation  Date/Time:  Monday February 26 2019 18:53:36 EDT Ventricular Rate:  80 PR Interval:    QRS Duration: 104 QT Interval:  413 QTC Calculation: 477  R Axis:   -59 Text Interpretation:  Sinus rhythm Incomplete RBBB and LAFB Abnormal R-wave progression, early transition Consider  anterior infarct nonspecific flattenning compared with prior 2/20 Confirmed by Meridee ScoreButler, Michael [phone]([id number]) on 02/26/2019 9:13:29 PM       Radiology Ct Head Wo Contrast  Result Date: 02/26/2019 CLINICAL DATA:  Seizure EXAM: CT HEAD WITHOUT CONTRAST TECHNIQUE: Contiguous axial images were obtained from the base of the skull through the vertex without intravenous contrast. COMPARISON:  10/22/2018 FINDINGS: Brain: No evidence of acute infarction, hemorrhage, hydrocephalus, extra-axial collection or mass lesion/mass effect. Mild periventricular white matter hypodensity. Vascular: No hyperdense vessel or unexpected calcification. Skull: Normal. Negative for fracture or focal lesion. Sinuses/Orbits: No acute finding. Other: None. IMPRESSION: No acute intracranial pathology. Mild small-vessel white matter disease. Electronically Signed   By: Lauralyn PrimesAlex  Bibbey M.D.   On: 02/26/2019 18:10   Dg Chest Port 1 View  Result Date: 02/26/2019 CLINICAL DATA:  Seizure EXAM: PORTABLE CHEST 1 VIEW COMPARISON:  10/22/2018 FINDINGS: Cardiomegaly. Both lungs are clear. The visualized skeletal structures are unremarkable. IMPRESSION: Cardiomegaly without acute abnormality of the lungs in AP portable projection. Electronically Signed   By: Lauralyn PrimesAlex  Bibbey M.D.   On: 02/26/2019 18:32    Procedures Procedures (including critical care time)  Medications Ordered in ED Medications  sodium chloride 0.9 % bolus 500 mL (has no administration in time range)  potassium chloride SA (K-DUR) CR tablet 40 mEq (has no administration in time range)  ALPRAZolam (XANAX) tablet 0.5 mg (has no administration in time range)  amLODipine (NORVASC) tablet 5 mg (has no administration in time range)  busPIRone (BUSPAR) tablet 20 mg (has no administration in time range)  LORazepam (ATIVAN) tablet 1 mg (has no administration in time range)  losartan (COZAAR) tablet 100 mg (has no administration in time range)  metoprolol succinate (TOPROL-XL) 24 hr tablet  25 mg (has no administration in time range)  zolpidem (AMBIEN) tablet 5 mg (has no administration in time range)  LORazepam (ATIVAN) tablet 1 mg (1 mg Oral Given 02/26/19 1951)     Initial Impression / Assessment and Plan / ED Course  I have reviewed the triage vital signs and the nursing notes.  Pertinent labs & imaging results that were available during my care of the patient were reviewed by me and considered in my medical decision making (see chart for details).  Clinical Course as of Feb 25 2205  Mon Feb 26, 2019  2108 Care signed out to PA Rhea BleacherJosh Geiple at 9:08 PM.    [AM]  2108 Patient's baseline is 129-130.   Sodium(!): 128 [AM]  2206 Reassessed. Believe this was charted in error. Pt has not been tachypneic on my evaluation.   Resp(!): 38 [AM]    Clinical Course User Index [AM] Elisha PonderMurray, Eden Rho B, PA-C       This is a 62-year male with a past medical history of anxiety, depression,  insomnia, mitral valve prolapse, degenerative disc disease presenting for abnormal convulsive-like activity reported by his family as well as request for psychiatric evaluation in the emergency room per his psychiatrist (per the patient).  In the emergency room, he does not appear postictal.  He is alert and oriented.  He does appear to have flight of ideas, increased alertness and some paranoia.  Questionable hallucinations with seeing "white powder" in his home.    In speaking with his son for collateral information, he has had a progressive mental decline over the past 2 months.  They  report that his psychiatrist is acutely involved in his case and wants him to have emergency psychiatric care.  I do not feel that this patient is stable to go home tonight, and will require psychiatric evaluation tomorrow.  Should he attempt to leave, would recommend IVC.  He was informed of this and the recommendation to stay, and is cooperative. He was also evaluated by attending physician, Dr. Donnetta Hutching, who is in  agreement.  Regarding seizure-like activity, CT head obtained for possible head trauma which was normal.  Patient does not have a history of seizures.  He does take benzodiazepines, and may have medication noncompliance.  He has had no further activity here in the emergency department.  Per evaluation of attending physician, Dr. Donnetta Hutching, he can be medically cleared after his laboratory values return and he can speak with TTS.  He will remain on his benzodiazepine regimen. Benzo regimen confirmed with patient's psychiatrist.  Patient has stable and chronic hyponatremia today at 128. Baseline over past couple months 129-130. HCTZ may be contributory, will discontinue. Also discontinued Cozaar temporarily as patient was on a high dose.  His blood pressures been very stable here.  Given the weight loss and poor p.o. intake, feel that additional antihypertensives can be added back when blood pressures are determined to be hypertensive. Pt has stable BP here, states he took his meds this AM, but family is unsure.   Patient's primary psychiatrist, Dr. Donell Beers called this writer in the emergency department to provide history and the patient.  He reports that he is been the patient's psychiatrist for many years.  He reports that the patient had abnormal mental status today with complete erratic nature.  This is unlike him.  He reports that the patient was admitted to [facility] within the last 2 months, however he was not involved in the patient's care at that time. Dr. Donell Beers encourages calls regarding further information about patient at [phone].   Home medications are ordered and benzodiazepines are ensured to be scheduled as ordered by psychiatrist. Verified pt's regimen with psychiatry.   Care signed out to Rhea Bleacher, PA-C at 10:26 PM to follow up on TTS assessment and psychiatry recommendations.   This is a shared visit with Dr. Donnetta Hutching. Patient was independently evaluated by this attending  physician. Attending physician consulted in evaluation and management, and disposition planning.   Final Clinical Impressions(s) / ED Diagnoses   Final diagnoses:  Seizure [facility])  Fall, initial encounter  Change in behavior    ED Discharge Orders    None         Delia Chimes 02/27/19 0042    Donnetta Hutching, MD 03/05/19 [phone]

## 2019-02-26 NOTE — ED Notes (Signed)
Phlebotomy attempted lab draw Pt refused

## 2019-02-26 NOTE — ED Notes (Signed)
ED Provider at bedside. 

## 2019-02-26 NOTE — ED Provider Notes (Signed)
11:23 PM handoff from [facility] at shift change.  Patient currently in the emergency department with questionable seizures, also worsening paranoid and manic behaviors.  Currently awaiting TTS consultation.  Lab work shows mild chronic hyponatremia, mild hypokalemia with normal magnesium.  CT head without acute findings.  BP 109/73   Pulse 63   Temp 99.2 F (37.3 C) (Oral)   Resp 18   SpO2 100%   12:30 AM Lorre Munroe PA-C aware at shift change.    Carlisle Cater, PA-C 02/27/19 0030    Hayden Rasmussen, MD 02/27/19 [phone]

## 2019-02-26 NOTE — ED Notes (Signed)
TTS outside of pt. room

## 2019-02-26 NOTE — ED Notes (Signed)
Please call pt's wife at [phone] or or his son at [phone] to give them an update on pt.

## 2019-02-26 NOTE — ED Triage Notes (Addendum)
Per EMS: pt here from home with wife.Wife witnessed pt have a full body seizure that lasted 30-45 seconds. EMS reports patient fell during seizure. Denies neck or back pain. Upon arrival pt repeatedly stating he sees white dust coming out of the vents at home.    EMS Vitals:   BP 136/59 Pulse 111 Sp02 98% RA CBG 96

## 2019-02-26 NOTE — ED Notes (Signed)
Pt given [place] sandwich bag, milk, and orange juice per Alyssa- PA.

## 2019-02-26 NOTE — ED Notes (Signed)
Pt refusing lab draw  EDP aware

## 2019-02-27 ENCOUNTER — Other Ambulatory Visit: Payer: Self-pay

## 2019-02-27 ENCOUNTER — Inpatient Hospital Stay (HOSPITAL_COMMUNITY)
Admission: AD | Admit: 2019-02-27 | Discharge: 2019-03-02 | DRG: 885 | Disposition: A | Discharge status: Home / Self Care | Payer: BC Managed Care – PPO | Source: Intra-hospital | Attending: Psychiatry | Admitting: Psychiatry

## 2019-02-27 ENCOUNTER — Encounter (HOSPITAL_COMMUNITY): Payer: Self-pay

## 2019-02-27 ENCOUNTER — Other Ambulatory Visit: Payer: Self-pay | Admitting: Behavioral Health

## 2019-02-27 DIAGNOSIS — R4182 Altered mental status, unspecified: Secondary | ICD-10-CM | POA: Diagnosis present

## 2019-02-27 DIAGNOSIS — K4090 Unilateral inguinal hernia, without obstruction or gangrene, not specified as recurrent: Secondary | ICD-10-CM

## 2019-02-27 DIAGNOSIS — F333 Major depressive disorder, recurrent, severe with psychotic symptoms: Secondary | ICD-10-CM | POA: Diagnosis not present

## 2019-02-27 DIAGNOSIS — F0280 Dementia in other diseases classified elsewhere without behavioral disturbance: Secondary | ICD-10-CM | POA: Diagnosis present

## 2019-02-27 DIAGNOSIS — G4700 Insomnia, unspecified: Secondary | ICD-10-CM | POA: Diagnosis present

## 2019-02-27 DIAGNOSIS — Z1159 Encounter for screening for other viral diseases: Secondary | ICD-10-CM

## 2019-02-27 DIAGNOSIS — F329 Major depressive disorder, single episode, unspecified: Secondary | ICD-10-CM | POA: Diagnosis present

## 2019-02-27 DIAGNOSIS — F323 Major depressive disorder, single episode, severe with psychotic features: Secondary | ICD-10-CM | POA: Diagnosis present

## 2019-02-27 DIAGNOSIS — I10 Essential (primary) hypertension: Secondary | ICD-10-CM | POA: Diagnosis present

## 2019-02-27 DIAGNOSIS — K5900 Constipation, unspecified: Secondary | ICD-10-CM

## 2019-02-27 MED ORDER — SODIUM CHLORIDE 1 G PO TABS
2.0000 g | ORAL_TABLET | Freq: Two times a day (BID) | ORAL | Status: DC
  Administered 2019-02-27 – 2019-03-01 (×4): 2 g via ORAL
  Filled 2019-02-27 (×11): qty 2

## 2019-02-27 MED ORDER — TEMAZEPAM 30 MG PO CAPS
30.0000 mg | ORAL_CAPSULE | Freq: Every day | ORAL | Status: DC
  Administered 2019-02-27 – 2019-03-01 (×3): 30 mg via ORAL
  Filled 2019-02-27: qty 2
  Filled 2019-02-27: qty 1

## 2019-02-27 MED ORDER — PRENATAL MULTIVITAMIN CH
1.0000 | ORAL_TABLET | Freq: Every day | ORAL | Status: DC
  Administered 2019-02-27 – 2019-03-02 (×4): 1 via ORAL
  Filled 2019-02-27 (×7): qty 1

## 2019-02-27 MED ORDER — CLONIDINE HCL 0.1 MG PO TABS
0.2000 mg | ORAL_TABLET | ORAL | Status: DC | PRN
  Administered 2019-03-02: 0.2 mg via ORAL
  Filled 2019-02-27: qty 2

## 2019-02-27 MED ORDER — LORAZEPAM 1 MG PO TABS
1.0000 mg | ORAL_TABLET | Freq: Three times a day (TID) | ORAL | Status: DC
  Administered 2019-02-27 – 2019-03-02 (×8): 1 mg via ORAL
  Filled 2019-02-27 (×9): qty 1

## 2019-02-27 MED ORDER — ACETAMINOPHEN 325 MG PO TABS
650.0000 mg | ORAL_TABLET | Freq: Four times a day (QID) | ORAL | Status: DC | PRN
  Administered 2019-03-01: 650 mg via ORAL
  Filled 2019-02-27: qty 2

## 2019-02-27 MED ORDER — ALUM & MAG HYDROXIDE-SIMETH 200-200-20 MG/5ML PO SUSP: 30.0000 mL | ORAL | Status: DC | PRN

## 2019-02-27 MED ORDER — ENSURE ENLIVE PO LIQD: 237.0000 mL | Freq: Two times a day (BID) | ORAL | Status: DC

## 2019-02-27 NOTE — Progress Notes (Signed)
Pt accepted to [facility], Bed 503-1 Mordecai Maes NP is the accepting provider.  Johnn Hai, MD. is the attending provider.  Call report to [phone]  Becky @ [facility] ED notified.   Pt is Voluntary.  Pt may be transported by [facility]  Pt scheduled  to arrive at [facility] as soon as transport can be arranged.  Areatha Keas. Judi Cong, MSW, [place] Disposition Clinical Social Work [phone] (cell) [phone] (office)

## 2019-02-27 NOTE — ED Provider Notes (Signed)
TTS recommends inpatient.  Working on finding a bed.   Montine Circle, PA-C 02/27/19 [place], Delice Bison, DO 02/27/19 [phone]

## 2019-02-27 NOTE — ED Notes (Signed)
Lindon Romp, NP, patient meets inpatient criteria. No available beds at [facility] per [facility]. TTS will secure placement. RN informed of disposition.

## 2019-02-27 NOTE — Tx Team (Signed)
Initial Treatment Plan 02/27/2019 2:19 PM Brad [name] [facility] MDY:[id number]    PATIENT STRESSORS: Financial difficulties Health problems Medication change or noncompliance   PATIENT STRENGTHS: Average or above average intelligence Communication skills Supportive family/friends   PATIENT IDENTIFIED PROBLEMS: "To organize my life again"  "Overcome other people's prejudice"  Anxiety  Hallucination               DISCHARGE CRITERIA:  Adequate post-discharge living arrangements Motivation to continue treatment in a less acute level of care  PRELIMINARY DISCHARGE PLAN: Attend aftercare/continuing care group Outpatient therapy Return to previous living arrangement  PATIENT/FAMILY INVOLVEMENT: This treatment plan has been presented to and reviewed with the patient, Brad [name], and/or family member.  The patient and family have been given the opportunity to ask questions and make suggestions.  Vela Prose, RN 02/27/2019, 2:19 PM

## 2019-02-27 NOTE — ED Notes (Signed)
Ordered bfast 

## 2019-02-27 NOTE — ED Notes (Signed)
Pt repeatedly leaving room in purple zone, calm but insisting that he "needs to make plans for the futurre." Reassured pt that family and friend will bring fresh clothes and will be contacted prior to discharge. Pt reluctant to return to room but is cooperative at this time.

## 2019-02-27 NOTE — Progress Notes (Signed)
Admission Note: Patient is a 63 year old male admitted to the unit for increased depression and anxiety.  Patient is alert and oriented to person, place and time.  Presents with anxious affect and mood.  Refused to sign treatment plan and forward little information during assessment.  Appears guarded and paranoid.  Skin assessment and personal belongings completed.  Skin is dry and intact.  Complained of hernia noted on left side of groin. States he is here to organize his life again and to overcome prejudice.  Patient oriented to the unit, staff and room.  Routine safety checks initiated.  Patient is safe on the unit.

## 2019-02-27 NOTE — Progress Notes (Signed)
NUTRITION ASSESSMENT RD working remotely.   Pt identified as at risk on the Malnutrition Screen Tool  INTERVENTION: - continue Ensure Enlive BID, each supplement provides 350 kcal and 20 grams of protein. - continue to encourage PO intakes.    NUTRITION DIAGNOSIS: Unintentional weight loss related to sub-optimal intake as evidenced by pt report.   Goal: Pt to meet >/= 90% of their estimated nutrition needs.  Monitor:  PO intake  Assessment:  Patient admitted for increased depression and anxiety. Notes indicate that patient had been extremely paranoid for several weeks to months PTA.  Per chart review, current weight is 129 lb and weight on 6/8 was 137. This indicates 8 lb weight loss (5.8% body weight) in ~3 weeks; significant for time frame.    63 y.o. male  Height: Ht Readings from Last 1 Encounters:  02/27/19 5\' 10"  (1.778 m)    Weight: Wt Readings from Last 1 Encounters:  02/27/19 58.5 kg    Weight Hx: Wt Readings from Last 10 Encounters:  02/27/19 58.5 kg  02/05/19 62.1 kg  10/21/18 65.8 kg  06/28/18 68.1 kg  03/17/15 68.9 kg  05/03/14 73.9 kg  04/30/14 73.9 kg  04/12/14 72.1 kg  12/02/11 69.4 kg  11/04/11 69.6 kg    BMI:  Body mass index is 18.51 kg/m. Pt meets criteria for underweight based on current BMI.  Estimated Nutritional Needs: Kcal: 25-30 kcal/kg Protein: > 1 gram protein/kg Fluid: 1 ml/kcal  Diet Order:  Diet Order            Diet regular Room service appropriate? No; Fluid consistency: Thin; Fluid restriction: 2000 mL Fluid  Diet effective now             Pt is also offered choice of unit snacks mid-morning and mid-afternoon.  Pt is eating as desired.   Lab results and medications reviewed.      Jarome Matin, MS, RD, LDN, [facility] Inpatient Clinical Dietitian Pager # [phone] After hours/weekend pager # [phone]

## 2019-02-27 NOTE — Progress Notes (Addendum)
Brad [name] did not attend wrap- up group. He appears flat/restless/anxious in affect and mood. Pt denies SI/HI/AVH at this time. Pt appears to be preoccupied with his medications. Pt states he takes ativan, buspar, and ambien. Pt states he will not be able to sleep tonight. Pt states "I normally take Ambien 12.5 at night. Pt was ordered Restoril in which he indicated "That won't do a thing; Why not Seroquel?". Pt requires firm redirection from staff; he appears to be forgetful. Support offered. Will continue with POC.

## 2019-02-27 NOTE — [facility] Suicide Risk Assessment (Signed)
[facility] Admission Suicide Risk Assessment   Nursing information obtained from:  Patient Demographic factors:  Male Current Mental Status:  NA Loss Factors:  Decline in physical health Historical Factors:  NA Risk Reduction Factors:  Living with another person, especially a relative  Total Time spent with patient: 45 minutes Principal Problem: mental status changes Diagnosis:  Active Problems:   MDD (major depressive disorder)  Subjective Data: new onset mental status changes in high functioning pt  Continued Clinical Symptoms:    The "Alcohol Use Disorders Identification Test", Guidelines for Use in Primary Care, Second Edition.  World Pharmacologist [facility]). Score between 0-7:  no or low risk or alcohol related problems. Score between 8-15:  moderate risk of alcohol related problems. Score between 16-19:  high risk of alcohol related problems. Score 20 or above:  warrants further diagnostic evaluation for alcohol dependence and treatment.   CLINICAL FACTORS:   Depression:   Severe   Musculoskeletal: Strength & Muscle Tone: within normal limits Gait & Station: normal Patient leans: N/A  Psychiatric Specialty Exam: Physical Exam  ROS  Blood pressure 102/61, pulse (!) 54, temperature 98.8 F (37.1 C), temperature source Oral, resp. rate 18, height 5\' 10"  (1.778 m), weight 58.5 kg, SpO2 100 %.Body mass index is 18.51 kg/m.  General Appearance: Casual  Eye Contact:  Good  Speech:  rambles- nl rate  Volume:  Normal  Mood:  Anxious and Dysphoric  Affect:  Congruent and Constricted  Thought Process:  Irrelevant and Descriptions of Associations: Tangential  Orientation:  Full (Time, Place, and Person)  Thought Content:  Rumination and Tangential  Suicidal Thoughts:  No  Homicidal Thoughts:  No  Memory:  Immediate;   Poor  Judgement:  Impaired  Insight:  Fair  Psychomotor Activity:  Normal  Concentration:  Concentration: Fair  Recall:  Poor  Fund of Knowledge:  Fair   Language:  Fair  Akathisia:  Negative  Handed:  Right  AIMS (if indicated):     Assets:  Resilience  ADL's:  Intact  Cognition:  WNL  Sleep:         COGNITIVE FEATURES THAT CONTRIBUTE TO RISK:  Loss of executive function    SUICIDE RISK:   Minimal: No identifiable suicidal ideation.  Patients presenting with no risk factors but with morbid ruminations; may be classified as minimal risk based on the severity of the depressive symptoms  PLAN OF CARE: see eval  I certify that inpatient services furnished can reasonably be expected to improve the patient's condition.   Johnn Hai, MD 02/27/2019, 3:01 PM

## 2019-02-27 NOTE — ED Notes (Signed)
RN contacted Opal Sidles @ [facility] for Pt transport to [facility]

## 2019-02-27 NOTE — BH Assessment (Addendum)
Tele Assessment Note   Patient Name: Brad [name] MRN: [id number] Referring Physician: Renne CriglerJoshua Geiple, PA-C Location of Patient: [facility] Location of Provider: Behavioral Health TTS Department  Brad [name] is an 63 y.o. male presenting with increased depression and anxiety. Patient denied SI, HI, psychosis and drug and alcohol usage. During beginning of assessment, clinician asked what brought you here, patient told event of how he was searching around the house for something, it was unclean, there was white dust ever where. Patient noticed missing pieces of plaster from ceiling and on the floor. Patient reported plaster pieces were on the floor and in his eye. Patient continued to make repetitive statements as he described the white substance and plaster under your feet. Patient reported inhaling white dust and getting the white pebbles. Patient reported becoming very dizzy and out of breath, which is how he presented to Brad [name] appointment on 02/27/19. Patient reported getting plaster in his eyes and it was uncomfortable. Patient denied past SI, HI and alcohol/drug usage. Patient reported inpatient mental health treatment in 2008, due to patient inability to care for himself. Brad [name] recommended patient be seen at the ED.   PER CHART Pt reported, he was bullied when he was in the 9th grade. Pt's UDS is positive for benzodiazepines. Pt's reported, he is linked to Brad [name] for medication management. Pt reported, he is prescribed: Lorazepam, Buspar and Xanax (PRN). Pt reported, he has an appointment with his psychiatrist on Monday (02/26/2019). Pt reported, he started seeing a new therapist last week. Per chart, pt has previous inpatient admissions to [facility].   PER EDP COLLATERAL CONTACT: Collateral information obtained from patient's son Brad [name], who was in the presence of the patient's wife, who is mostly non-English-speaking.  He states that the patient fell down approximately 5-6 steps today,  they believe he hit his head.  They thought they saw him convulsing for 30 seconds.  He has no history of seizures.  They did not know if he stopped any of his medications voluntarily such as his benzodiazepines.  Prior to this, they report that his mental state has been deteriorating and that his personal psychiatrist was aware of his episode today and recommended he come to the emergency department.  According to them, the primary psychiatrist, Brad [name] will be in touch with the emergency psychiatry team.  They report that patient has been on a 633-month decline and is extremely paranoid.  He has expressed others at times that he wants to commit suicide however has not made any acute mentions of this today.  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  He is normally very high functioning.  Patient reports to this writer that he has not been practicing in some time.  UDS +benzodiazepines BAL negative  Diagnosis: Generalized Anxiety Disorder  Past Medical History:  Past Medical History:  Diagnosis Date  . Allergy   . Anxiety   . Depression   . DJD (degenerative joint disease), cervical    postiton with pillow under knees, cant turn neck   . Dysentery, amebic, acute 1981  . GERD (gastroesophageal reflux disease)   . H/O bronchitis   . H/O malaria 1984  . Hearing loss    bilateral   . Hypertension    labile Blood pressure  . Inguinal hernia   . Insomnia    early morning awakening  . MVP (mitral valve prolapse)    "no problems"  . Perianal pain   .  Personal history of colonic polyps   . Tinnitus    right ear    Past Surgical History:  Procedure Laterality Date  . CARPAL TUNNEL RELEASE  10/06, 5/10   right wrist   . CARPAL TUNNEL RELEASE  3/10   left wrist   . cervical spine discectomy   09/2005  . COLONOSCOPY WITH PROPOFOL N/A 10/21/2015   Procedure: COLONOSCOPY WITH PROPOFOL;  Surgeon: [name], MD;  Location: [facility];  Service: Endoscopy;   Laterality: N/A;  . INGUINAL HERNIA REPAIR  08/16/2011   Procedure: HERNIA REPAIR INGUINAL ADULT;  Surgeon: [name], MD;  Location: [facility];  Service: General;  Laterality: Left;  . INGUINAL HERNIA REPAIR Right 05/03/2014   Procedure: OPEN RIGHT INGUINAL HERNIA REPAIR WITH MESH;  Surgeon: [name], MD;  Location: [facility];  Service: General;  Laterality: Right;  . INSERTION OF MESH Right 05/03/2014   Procedure: INSERTION OF MESH;  Surgeon: [name], MD;  Location: [facility];  Service: General;  Laterality: Right;  . TONSILLECTOMY  1960    Family History:  Family History  Problem Relation Age of Onset  . Cancer Mother        breast  . Intracerebral hemorrhage Father     Social History:  reports that he has never smoked. He has never used smokeless tobacco. He reports current alcohol use. He reports current drug use. Drug: Marijuana.  Additional Social History:  Alcohol / Drug Use Pain Medications: see MAR Prescriptions: see MAR Over the Counter: see MAR  CIWA: CIWA-Ar BP: 109/73 Pulse Rate: 63 COWS:    Allergies:  Allergies  Allergen Reactions  . Gabapentin Other (See Comments)    Unable to urinate, drowsiness  . Trileptal [Oxcarbazepine] Other (See Comments)    Bad taste in his mouth    Home Medications: (Not in a hospital admission)   OB/GYN Status:  No LMP for male patient.  General Assessment Data Location of Assessment: [facility] ED TTS Assessment: In system Is this a Tele or Face-to-Face Assessment?: Tele Assessment Is this an Initial Assessment or a Re-assessment for this encounter?: Initial Assessment Patient Accompanied by:: Adult Permission Given to speak with another: Yes Name, Relationship and Phone Number: (Brad [name], wife [phone]) Language Other than English: No Living Arrangements: Other (Comment)(with wife) What gender do you identify as?: Male Marital status: Married Living Arrangements: Spouse/significant other Can pt  return to current living arrangement?: Yes Admission Status: Voluntary Is patient capable of signing voluntary admission?: Yes Referral Source: Self/Family/Friend     Crisis Care Plan Living Arrangements: Spouse/significant other Legal Guardian: Other:(self) Name of Psychiatrist: [name] Name of Therapist: Seen new counseslor last week.  Education Status Is patient currently in school?: No Is the patient employed, unemployed or receiving disability?: Unemployed  Risk to self with the past 6 months Suicidal Ideation: No Has patient been a risk to self within the past 6 months prior to admission? : No Suicidal Intent: No Has patient had any suicidal intent within the past 6 months prior to admission? : No Is patient at risk for suicide?: No Suicidal Plan?: No Has patient had any suicidal plan within the past 6 months prior to admission? : No Access to Means: No What has been your use of drugs/alcohol within the last 12 months?: (benzodiazepines) Previous Attempts/Gestures: No How many times?: (0) Other Self Harm Risks: (n/a) Triggers for Past Attempts: None known Intentional Self Injurious Behavior: None Family Suicide History: No Recent stressful  life event(s): Financial Problems(per chart) Persecutory voices/beliefs?: No Depression: Yes Depression Symptoms: Feeling worthless/self pity, Feeling angry/irritable, Isolating, Fatigue Substance abuse history and/or treatment for substance abuse?: Yes Suicide prevention information given to non-admitted patients: Not applicable  Risk to Others within the past 6 months Homicidal Ideation: No Does patient have any lifetime risk of violence toward others beyond the six months prior to admission? : No Thoughts of Harm to Others: No Current Homicidal Intent: No Current Homicidal Plan: No Access to Homicidal Means: No Identified Victim: (n/a) History of harm to others?: No Assessment of Violence: None Noted Violent Behavior  Description: (none reported) Does patient have access to weapons?: No(denied) Criminal Charges Pending?: No Does patient have a court date: No Is patient on probation?: No  Psychosis Hallucinations: None noted Delusions: None noted  Mental Status Report Appearance/Hygiene: Unremarkable Eye Contact: Poor Motor Activity: Restlessness Speech: Slow, Soft Level of Consciousness: Alert Mood: Anxious, Depressed Affect: Anxious, Depressed, Appropriate to circumstance Anxiety Level: Severe Thought Processes: Relevant, Coherent Judgement: Partial Orientation: Person, Place, Time, Situation Obsessive Compulsive Thoughts/Behaviors: None  Cognitive Functioning Concentration: Fair Memory: Recent Intact Is patient IDD: No Insight: Fair Impulse Control: Fair Appetite: Poor Have you had any weight changes? : Loss Amount of the weight change? (lbs): (20) Sleep: Decreased Total Hours of Sleep: (interrupted sleep) Vegetative Symptoms: None, Decreased grooming  ADLScreening [facility] Assessment Services) Patient's cognitive ability adequate to safely complete daily activities?: Yes Patient able to express need for assistance with ADLs?: Yes Independently performs ADLs?: Yes (appropriate for developmental age)  Prior Inpatient Therapy Prior Inpatient Therapy: No  Prior Outpatient Therapy Prior Outpatient Therapy: Yes Prior Therapy Dates: Current. Prior Therapy Facilty/Provider(s): Brad [name]. Reason for Treatment: Medication management.  Does patient have an ACCT team?: No Does patient have Intensive In-House Services?  : No Does patient have [facility] services? : No Does patient have P4CC services?: No  ADL Screening (condition at time of admission) Patient's cognitive ability adequate to safely complete daily activities?: Yes Patient able to express need for assistance with ADLs?: Yes Independently performs ADLs?: Yes (appropriate for developmental age)  Merchant navy officerAdvance Directives (For  Healthcare) Does Patient Have a Medical Advance Directive?: No Would patient like information on creating a medical advance directive?: No - Patient declined    Disposition:  Disposition Initial Assessment Completed for this Encounter: Yes  Nira ConnJason Berry, NP, patient meets inpatient criteria. [facility] TTS will secure placement. RN informed of disposition.   This service was provided via telemedicine using a 2-way, interactive audio and video technology.  Names of all persons participating in this telemedicine service and their role in this encounter. Name: Theotis BarrioJohn Legrande Role: Patient  Name: Al CorpusLatisha Lovenia Debruler Role: TTS Clinician  Name:  Role:   Name: Role:     Burnetta SabinLatisha D Saoirse Legere 02/27/2019 1:05 AM

## 2019-02-27 NOTE — ED Notes (Signed)
Pt asks to use one of two 5 min phone calls for today. Aware of unit policy for pt to have two 5 min phone calls per day. Continues to be fixated on "making plans for the future" which he repeats to staff

## 2019-02-27 NOTE — Progress Notes (Signed)
[place] NOVEL CORONAVIRUS (COVID-19) DAILY CHECK-OFF SYMPTOMS - answer yes or no to each - every day NO YES  Have you had a fever in the past 24 hours?  . Fever (Temp > 37.80C / 100F) X   Have you had any of these symptoms in the past 24 hours? . New Cough .  Sore Throat  .  Shortness of Breath .  Difficulty Breathing .  Unexplained Body Aches   X   Have you had any one of these symptoms in the past 24 hours not related to allergies?   . Runny Nose .  Nasal Congestion .  Sneezing   X   If you have had runny nose, nasal congestion, sneezing in the past 24 hours, has it worsened?  X   EXPOSURES - check yes or no X   Have you traveled outside the state in the past 14 days?  X   Have you been in contact with someone with a confirmed diagnosis of COVID-19 or PUI in the past 14 days without wearing appropriate PPE?  X   Have you been living in the same home as a person with confirmed diagnosis of COVID-19 or a PUI (household contact)?    X   Have you been diagnosed with COVID-19?    X              What to do next: Answered NO to all: Answered YES to anything:   Proceed with unit schedule Follow the [facility] Inpatient Flowsheet.   

## 2019-02-27 NOTE — Progress Notes (Signed)
Patient did not attend wrap up group. 

## 2019-02-27 NOTE — H&P (Signed)
Psychiatric Admission Assessment Adult  Patient Identification: Brad [name] MRN:  [id number] Date of Evaluation:  02/27/2019 Chief Complaint:  MDD Principal Diagnosis: Mental Status changes Diagnosis:  Active Problems:   MDD (major depressive disorder)  History of Present Illness:   This is the third psychiatric admission overall, the second this month, for Dr. Huntley Estelle, and baseline a high functioning, local therapist.  He is admitted for the continuation of mental status changes, decline in functioning, concerns over delusional believes and obsessiveness, further, recently had a witnessed seizure/fall with some head trauma. A CT scan of the brain on 6/49, yesterday, showed no acute pathology but was described as showing mild small vessel white matter disease/periventricular-  Further, the patient's been treated for generalized anxiety and has chronically taken lorazepam, 1 mg twice a day since 2009.  Further he has had alprazolam for breakthrough anxiety, as well as buspirone on an ongoing basis however he reports, and his wife confirms, that he is not missed many doses of the benzodiazepines, perhaps about a third of his doses but he does not believe he is suffering from benzodiazepine withdrawal because most days he generally takes at least 1 Ativan.  It is unclear whether his seizure was the result of missing doses of benzodiazepines and/or the result of chronic hyponatremia that has been noted in his past medical records, blamed on his antihypertensive diuretics, his sodium yesterday was 128, 5 days ago 129, and a month ago 130.  The patient himself, on mental status exam, is fully cooperative to the best of his ability but his short-term memory is impaired.  He is aware of day and general time he thinks it is already July 1 and has to be reoriented to the fact that is still June 30 -he can recall 3 of 3 objects immediately but 0 of 3 and is embarrassed by this performance stating that I "got  him on a bad day when he cannot do this" and states he has not slept in 4 days. He reports no auditory or visual hallucinations.  With regards to thoughts of harming himself he states that he has gotten to the point where he thought about suicide, no specific plans but then began imagining "what it would be like to be dead" and says "no way" and is able to dispel the thoughts of self destruction.  His wife reports that he was delusional and that he was insistent his wrist was broken and that other health issues were prominent and this is also noted in our recent medical records however the patient rationalizes this when I show him the x-ray report saying that his wrist x-rays are normal, he states "I been to a hand doctor and I know how they do x-rays here" and basically disputes the finding.  Past admissions included a [facility] admission in 2009 he states "they wanted to put me on Depakote and Seroquel" he also was admitted a couple weeks ago to [facility] states they want to start Seroquel but states they did not stop his Lorazepam.  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  He has been practicing since 1992.  In summary, though he has been coded as depression with psychosis-  I believe his mental status changes are the result of the following issues-  Chronic mild hyponatremia/recent fall and possible concussion/depression with obsessiveness and possible psychosis/delusional focus on health     Associated  Signs/Symptoms: Depression Symptoms:  insomnia, (Hypo) Manic Symptoms:  Distractibility, Anxiety Symptoms:  Excessive Worry, Obsessive Compulsive Symptoms:   physical obsessions, Psychotic Symptoms:  Delusions, PTSD Symptoms: Had a traumatic exposure:  recent fall/loss of income Total Time spent with patient: 1 hour  Past Psychiatric History: past   Is the patient at risk to self? Yes.    Has the patient been a risk to self in the past 6 months? No.  Has the patient been a risk to self within the distant past? No.  Is the patient a risk to others? No.  Has the patient been a risk to others in the past 6 months? No.  Has the patient been a risk to others within the distant past? No.   Prior Inpatient Therapy:   Prior Outpatient Therapy:    Alcohol Screening: Patient refused Alcohol Screening Tool: Yes 1. How often do you have a drink containing alcohol?: Never 2. How many drinks containing alcohol do you have on a typical day when you are drinking?: 1 or 2 3. How often do you have six or more drinks on one occasion?: Never AUDIT-C Score: 0 Alcohol Brief Interventions/Follow-up: Patient Refused Substance Abuse History in the last 12 months:  No. Consequences of Substance Abuse: NA Previous Psychotropic Medications: Yes  Psychological Evaluations: No  Past Medical History:  Past Medical History:  Diagnosis Date  . Allergy   . Anxiety   . Depression   . DJD (degenerative joint disease), cervical    postiton with pillow under knees, cant turn neck   . Dysentery, amebic, acute 1981  . GERD (gastroesophageal reflux disease)   . H/O bronchitis   . H/O malaria 1984  . Hearing loss    bilateral   . Hypertension    labile Blood pressure  . Inguinal hernia   . Insomnia    early morning awakening  . MVP (mitral valve prolapse)    "no problems"  . Perianal pain   . Personal history of colonic polyps   . Tinnitus    right ear    Past Surgical History:  Procedure  Laterality Date  . CARPAL TUNNEL RELEASE  10/06, 5/10   right wrist   . CARPAL TUNNEL RELEASE  3/10   left wrist   . cervical spine discectomy   09/2005  . COLONOSCOPY WITH PROPOFOL N/A 10/21/2015   Procedure: COLONOSCOPY WITH PROPOFOL;  Surgeon: Charolett Bumpers, MD;  Location: [facility];  Service: Endoscopy;  Laterality: N/A;  . INGUINAL HERNIA REPAIR  08/16/2011   Procedure: HERNIA REPAIR INGUINAL ADULT;  Surgeon: Valarie Merino, MD;  Location: [facility];  Service: General;  Laterality: Left;  . INGUINAL HERNIA REPAIR Right 05/03/2014   Procedure: OPEN RIGHT INGUINAL HERNIA REPAIR WITH MESH;  Surgeon: Wenda Low, MD;  Location: [facility];  Service: General;  Laterality: Right;  . INSERTION OF MESH Right 05/03/2014   Procedure: INSERTION OF MESH;  Surgeon: Wenda Low, MD;  Location: [facility];  Service: General;  Laterality: Right;  . TONSILLECTOMY  1960   Family History:  Family History  Problem Relation Age of Onset  .  Cancer Mother        breast  . Intracerebral hemorrhage Father    Family Psychiatric  History: neg Tobacco Screening: Have you used any form of tobacco in the last 30 days? (Cigarettes, Smokeless Tobacco, Cigars, and/or Pipes): No Social History:  Social History   Substance and Sexual Activity  Alcohol Use Yes   Comment: 2 wine daily     Social History   Substance and Sexual Activity  Drug Use Yes  . Types: Marijuana   Comment: weekend use    Additional Social History:                           Allergies:   Allergies  Allergen Reactions  . Gabapentin Other (See Comments)    Unable to urinate, drowsiness  . Trileptal [Oxcarbazepine] Other (See Comments)    Bad taste in his mouth   Lab Results:  Results for orders placed or performed during the hospital encounter of 02/26/19 (from the past 48 hour(s))  CBG monitoring, ED     Status: None   Collection Time: 02/26/19  7:05 PM  Result Value Ref Range   Glucose-Capillary 78 70  - 99 mg/dL   Comment 1 Notify RN    Comment 2 Document in Chart   CBC WITH DIFFERENTIAL     Status: Abnormal   Collection Time: 02/26/19  7:13 PM  Result Value Ref Range   WBC 9.2 4.0 - 10.5 K/uL   RBC 4.15 (L) 4.22 - 5.81 MIL/uL   Hemoglobin 13.0 13.0 - 17.0 g/dL   HCT 36.5 (L) 39.0 - 52.0 %   MCV 88.0 80.0 - 100.0 fL   MCH 31.3 26.0 - 34.0 pg   MCHC 35.6 30.0 - 36.0 g/dL   RDW 12.0 11.5 - 15.5 %   Platelets 186 150 - 400 K/uL   nRBC 0.0 0.0 - 0.2 %   Neutrophils Relative % 79 %   Neutro Abs 7.3 1.7 - 7.7 K/uL   Lymphocytes Relative 13 %   Lymphs Abs 1.2 0.7 - 4.0 K/uL   Monocytes Relative 8 %   Monocytes Absolute 0.7 0.1 - 1.0 K/uL   Eosinophils Relative 0 %   Eosinophils Absolute 0.0 0.0 - 0.5 K/uL   Basophils Relative 0 %   Basophils Absolute 0.0 0.0 - 0.1 K/uL   Immature Granulocytes 0 %   Abs Immature Granulocytes 0.04 0.00 - 0.07 K/uL    Comment: Performed at [facility], [address], [place], [place] [address]  Comprehensive metabolic panel     Status: Abnormal   Collection Time: 02/26/19  7:13 PM  Result Value Ref Range   Sodium 128 (L) 135 - 145 mmol/L   Potassium 3.2 (L) 3.5 - 5.1 mmol/L   Chloride 95 (L) 98 - 111 mmol/L   CO2 22 22 - 32 mmol/L   Glucose, Bld 94 70 - 99 mg/dL   BUN 24 (H) 8 - 23 mg/dL   Creatinine, Ser 1.07 0.61 - 1.24 mg/dL   Calcium 9.2 8.9 - 10.3 mg/dL   Total Protein 6.2 (L) 6.5 - 8.1 g/dL   Albumin 3.8 3.5 - 5.0 g/dL   AST 49 (H) 15 - 41 U/L   ALT 31 0 - 44 U/L   Alkaline Phosphatase 75 38 - 126 U/L   Total Bilirubin 1.6 (H) 0.3 - 1.2 mg/dL   GFR calc non Af Amer >60 >60 mL/min   GFR  calc Af Amer >60 >60 mL/min   Anion gap 11 5 - 15    Comment: Performed at [facility], [address] [place], [place] [address]  Magnesium     Status: None   Collection Time: 02/26/19  7:13 PM  Result Value Ref Range   Magnesium 2.4 1.7 - 2.4 mg/dL    Comment: Performed at [facility], [address] [place], [place]  [address]  Ethanol/ETOH     Status: None   Collection Time: 02/26/19  7:13 PM  Result Value Ref Range   Alcohol, Ethyl (B) <10 <10 mg/dL    Comment: (NOTE) Lowest detectable limit for serum alcohol is 10 mg/dL. For medical purposes only. Performed at [facility], [address] [place], [place] [address]   Salicylate level     Status: None   Collection Time: 02/26/19  9:15 PM  Result Value Ref Range   Salicylate Lvl <7.0 2.8 - 30.0 mg/dL    Comment: Performed at [facility], [address] [place], [place] [address]  Acetaminophen level     Status: Abnormal   Collection Time: 02/26/19  9:15 PM  Result Value Ref Range   Acetaminophen (Tylenol), Serum <10 (L) 10 - 30 ug/mL    Comment: Performed at [facility], [address] [place], [place] [address]  Rapid urine drug screen (hospital performed)     Status: Abnormal   Collection Time: 02/26/19 10:25 PM  Result Value Ref Range   Opiates NONE DETECTED NONE DETECTED   Cocaine NONE DETECTED NONE DETECTED   Benzodiazepines POSITIVE (A) NONE DETECTED   Amphetamines NONE DETECTED NONE DETECTED   Tetrahydrocannabinol NONE DETECTED NONE DETECTED   Barbiturates NONE DETECTED NONE DETECTED    Comment: (NOTE) DRUG SCREEN FOR MEDICAL PURPOSES ONLY.  IF CONFIRMATION IS NEEDED FOR ANY PURPOSE, NOTIFY LAB WITHIN 5 DAYS. LOWEST DETECTABLE LIMITS FOR URINE DRUG SCREEN Drug Class                     Cutoff (ng/mL) Amphetamine and metabolites    1000 Barbiturate and metabolites    200 Benzodiazepine                 200 Tricyclics and metabolites     300 Opiates and metabolites        300 Cocaine and metabolites        300 THC                            50 Performed at [facility], [address] [place], [place] [address]   SARS Coronavirus 2 (CEPHEID - Performed in [facility] hospital lab), Hosp Order     Status: None   Collection Time: 02/26/19 10:25 PM   Specimen: Nasopharyngeal Swab  Result Value Ref  Range   SARS Coronavirus 2 NEGATIVE NEGATIVE    Comment: (NOTE) If result is NEGATIVE SARS-CoV-2 target nucleic acids are NOT DETECTED. The SARS-CoV-2 RNA is generally detectable in upper and lower  respiratory specimens during the acute phase of infection. The lowest  concentration of SARS-CoV-2 viral copies this assay can detect is 250  copies / mL. A negative result does not preclude SARS-CoV-2 infection  and should not be used as the sole basis for treatment or other  patient management decisions.  A negative result may occur with  improper specimen collection /  handling, submission of specimen other  than nasopharyngeal swab, presence of viral mutation(s) within the  areas targeted by this assay, and inadequate number of viral copies  (<250 copies / mL). A negative result must be combined with clinical  observations, patient history, and epidemiological information. If result is POSITIVE SARS-CoV-2 target nucleic acids are DETECTED. The SARS-CoV-2 RNA is generally detectable in upper and lower  respiratory specimens dur ing the acute phase of infection.  Positive  results are indicative of active infection with SARS-CoV-2.  Clinical  correlation with patient history and other diagnostic information is  necessary to determine patient infection status.  Positive results do  not rule out bacterial infection or co-infection with other viruses. If result is PRESUMPTIVE POSTIVE SARS-CoV-2 nucleic acids MAY BE PRESENT.   A presumptive positive result was obtained on the submitted specimen  and confirmed on repeat testing.  While 2019 novel coronavirus  (SARS-CoV-2) nucleic acids may be present in the submitted sample  additional confirmatory testing may be necessary for epidemiological  and / or clinical management purposes  to differentiate between  SARS-CoV-2 and other Sarbecovirus currently known to infect humans.  If clinically indicated additional testing with an alternate test   methodology [phone]) is advised. The SARS-CoV-2 RNA is generally  detectable in upper and lower respiratory sp ecimens during the acute  phase of infection. The expected result is Negative. Fact Sheet for Patients:  BoilerBrush.com.cyhttps://www.fda.gov/media/136312/download Fact Sheet for Healthcare Providers: https://pope.com/https://www.fda.gov/media/136313/download This test is not yet approved or cleared by the [place] FDA and has been authorized for detection and/or diagnosis of SARS-CoV-2 by FDA under an Emergency Use Authorization (EUA).  This EUA will remain in effect (meaning this test can be used) for the duration of the COVID-19 declaration under Section 564(b)(1) of the Act, 21 U.S.C. section 360bbb-3(b)(1), unless the authorization is terminated or revoked sooner. Performed at [facility], [address] [place], [place] [address]     Blood Alcohol level:  Lab Results  Component Value Date   ETH <10 02/26/2019   ETH <10 02/22/2019    Metabolic Disorder Labs:  No results found for: HGBA1C, MPG No results found for: PROLACTIN Lab Results  Component Value Date   CHOL  02/02/2008    193        ATP III CLASSIFICATION:  <200     mg/dL   Desirable  [id number]  mg/dL   Borderline High  >=782>=240    mg/dL   High   TRIG 45 95/62/130806/12/2007   HDL 80 02/02/2008   CHOLHDL 2.4 02/02/2008   VLDL 9 02/02/2008   LDLCALC (H) 02/02/2008    104        Total Cholesterol/HDL:CHD Risk Coronary Heart Disease Risk Table                     Men   Women  1/2 Average Risk   3.4   3.3    Current Medications: Current Facility-Administered Medications  Medication Dose Route Frequency Provider Last Rate Last Dose  . acetaminophen (TYLENOL) tablet 650 mg  650 mg Oral Q6H PRN Denzil Magnusonhomas, Lashunda, NP      . alum & mag hydroxide-simeth (MAALOX/MYLANTA) 200-200-20 MG/5ML suspension 30 mL  30 mL Oral Q4H PRN Denzil Magnusonhomas, Lashunda, NP      . LORazepam (ATIVAN) tablet 1 mg  1 mg Oral TID Malvin JohnsFarah, Jomaira Darr, MD      . Melene Muller[START ON  02/28/2019] prenatal vitamin w/FE, FA (NATACHEW) chewable tablet  1 tablet  1 tablet Oral Q1200 Malvin JohnsFarah, Keondre Markson, MD      . temazepam (RESTORIL) capsule 30 mg  30 mg Oral QHS Malvin JohnsFarah, Severiano Utsey, MD       PTA Medications: Medications Prior to Admission  Medication Sig Dispense Refill Last Dose  . ALPRAZolam (NIRAVAM) 0.5 MG dissolvable tablet Take 0.5 mg by mouth daily as needed for anxiety.      Marland Kitchen. amLODipine (NORVASC) 5 MG tablet Take 5 mg by mouth daily.     . busPIRone (BUSPAR) 10 MG tablet Take 20 mg by mouth 3 (three) times daily.      . hydrochlorothiazide (HYDRODIURIL) 12.5 MG tablet Take 12.5 mg by mouth daily.     Marland Kitchen. LORazepam (ATIVAN) 1 MG tablet Take 1 mg by mouth 2 (two) times a day.     . losartan (COZAAR) 100 MG tablet Take 100 mg by mouth daily.   3   . metoprolol succinate (TOPROL-XL) 25 MG 24 hr tablet Take 25 mg by mouth daily.     Marland Kitchen. oxyCODONE (ROXICODONE) 15 MG immediate release tablet Take 15 mg by mouth every 6 (six) hours as needed for pain.     . sildenafil (REVATIO) 20 MG tablet Take 60-100 mg by mouth daily as needed (for erectile dysfunction).      . zaleplon (SONATA) 10 MG capsule Take 10 mg by mouth at bedtime as needed (For early morning awakening.).      Marland Kitchen. zolpidem (AMBIEN CR) 12.5 MG CR tablet Take 12.5 mg by mouth at bedtime.       Musculoskeletal: Strength & Muscle Tone: within normal limits Gait & Station:  Slight wobbliness Patient leans: N/A  Psychiatric Specialty Exam: Physical Exam -head atraumatic vital stable but pulse low  ROS patient fall, wife reports he did hit his head but the patient denies any headache or other postconcussive syndromes, states at baseline he has right-sided tinnitus, states he has hypertension and his blood pressure can get quite high, denies disequilibrium but does shuffle a bit and has a general unsteadiness about him  Blood pressure 102/61, pulse (!) 54, temperature 98.8 F (37.1 C), temperature source Oral, resp. rate 18, height 5\' 10"   (1.778 m), weight 58.5 kg, SpO2 100 %.Body mass index is 18.51 kg/m.  General Appearance: Casual  Eye Contact:  Good  Speech:  rambles- nl rate  Volume:  Normal  Mood:  Anxious and Dysphoric  Affect:  Congruent and Constricted  Thought Process:  Irrelevant and Descriptions of Associations: Tangential  Orientation:  Full (Time, Place, and Person)  Thought Content:  Rumination and Tangential  Suicidal Thoughts:  No  Homicidal Thoughts:  No  Memory:  Immediate;   Poor  Judgement:  Impaired  Insight:  Fair  Psychomotor Activity:  Normal  Concentration:  Concentration: Fair  Recall:  Poor  Fund of Knowledge:  Fair  Language:  Fair  Akathisia:  Negative  Handed:  Right  AIMS (if indicated):     Assets:  Resilience  ADL's:  Intact  Cognition:  WNL  Sleep:         Treatment Plan Summary: Daily contact with patient to assess and evaluate symptoms and progress in treatment and Medication management  Observation Level/Precautions:  15 minute checks  Laboratory:  UDS  Psychotherapy: Reality based cognitive-based  Medications: Several adjustments but continue benzodiazepines  Consultations: Possible medicine consult if he cannot bring his sodium up simply by holding diuretics and adding salt tablets  Discharge Concerns: Long-term stability/diagnostic clarity  Estimated LOS: 5-7  Other:     Physician Treatment Plan for Primary Diagnosis: Correct medical issues addressed psychological issues Long Term Goal(s): Improvement in symptoms so as ready for discharge  Short Term Goals: Ability to verbalize feelings will improve, Ability to disclose and discuss suicidal ideas, Ability to demonstrate self-control will improve and Ability to identify and develop effective coping behaviors will improve  Physician Treatment Plan for Secondary Diagnosis: Active Problems:   MDD (major depressive disorder)  Long Term Goal(s): Improvement in symptoms so as ready for discharge  Short Term  Goals: Ability to demonstrate self-control will improve, Ability to identify and develop effective coping behaviors will improve and Ability to maintain clinical measurements within normal limits will improve  I certify that inpatient services furnished can reasonably be expected to improve the patient's condition.    Malvin Johns, MD 6/30/20203:14 PM

## 2019-02-27 NOTE — ED Notes (Signed)
Snack provided

## 2019-02-28 LAB — TSH: TSH: 0.604 u[IU]/mL (ref 0.350–4.500)

## 2019-02-28 LAB — LIPID PANEL
Cholesterol: 175 mg/dL (ref 0–200)
HDL: 78 mg/dL (ref >40)
LDL Cholesterol: 91 mg/dL (ref 0–99)
Total CHOL/HDL Ratio: 2.2 RATIO
Triglycerides: 29 mg/dL (ref <150)
VLDL: 6 mg/dL (ref 0–40)

## 2019-02-28 LAB — HEMOGLOBIN A1C
Hgb A1c MFr Bld: 5.5 % (ref 4.8–5.6)
Mean Plasma Glucose: 111.15 mg/dL

## 2019-02-28 MED ORDER — ACAMPROSATE CALCIUM 333 MG PO TBEC
666.0000 mg | DELAYED_RELEASE_TABLET | Freq: Three times a day (TID) | ORAL | Status: DC
  Administered 2019-02-28 (×2): 666 mg via ORAL
  Filled 2019-02-28 (×13): qty 2

## 2019-02-28 MED ORDER — DOCUSATE SODIUM 100 MG PO CAPS
200.0000 mg | ORAL_CAPSULE | Freq: Two times a day (BID) | ORAL | Status: DC
  Administered 2019-02-28 – 2019-03-02 (×2): 200 mg via ORAL
  Filled 2019-02-28 (×9): qty 2

## 2019-02-28 NOTE — Plan of Care (Signed)
Progress note  Pt found in bed; compliant with medication administration. Pt denies any physical pain. Pt is still vexed by his medications but not as preoccupied as yesterday. Pt is still anxious but again seems less anxious than yesterday. Pt denies si/hi/ah/vh and verbally agrees to approach staff if these become apparent or before harming himself/others while at [place]. Pt provided support and encouragement. Pt given medication per protocol and standing orders. Q3m safety checks implemented and continued. Will continue to monitor. Pt safe on the unit.   Pt progressing in the following metrics  Problem: Education: Goal: Knowledge of [facility] Education information/materials will improve Outcome: Progressing Goal: Emotional status will improve Outcome: Progressing Goal: Mental status will improve Outcome: Progressing Goal: Verbalization of understanding the information provided will improve Outcome: Progressing

## 2019-02-28 NOTE — Progress Notes (Signed)
Recreation Therapy Notes  Date: 7.1.20 Time: 1015 Location: [address] Dayroom  Group Topic: Communication, Team Building, Problem Solving  Goal Area(s) Addresses:  Patient will effectively work with peer towards shared goal.  Patient will identify skill used to make activity successful.  Patient will identify how skills used during activity can be used to reach post d/c goals.   Behavioral Response: Engaged  Intervention: STEM Activity   Activity:  Metallurgist.  Patients were in groups of three.  Patients were given 12 pipe cleaners.  Patients were to build a free standing tower as tall as possible.  During the course of the activity, there will be budget cuts and certain skills will be prohibited from use.   Education: Education officer, community, Dentist.   Education Outcome: Acknowledges education/In group clarification offered/Needs additional education.   Clinical Observations/Feedback:  Pt was active and engaged during group activity.  Pt expressed the group had to construct the different ideas of everyone in the group.  Pt stated this skill can be used with your support system by gathering ideas from everyone involved to come up with a plan.    Victorino Sparrow, LRT/CTRS     Victorino Sparrow A 02/28/2019 11:23 AM

## 2019-02-28 NOTE — Progress Notes (Signed)
Progress note  Pt found in bed; compliant with medication administration. Pt denies any physical problems or pain. Pt provided support and encouragement. Pt denies si/hi/ah/vh and verbally agrees to approach staff if these become apparent or before harming himself/others while at [place] given medication per protocol and standing orders. Pt safe on the unit. Will continue to monitor.

## 2019-02-28 NOTE — Tx Team (Signed)
fInterdisciplinary Treatment and Diagnostic Plan Update  02/28/2019 Time of Session: 09:19am Brad [name] [facility] MRN: [id number]  Principal Diagnosis: <principal problem not specified>  Secondary Diagnoses: Active Problems:   MDD (major depressive disorder)   Current Medications:  Current Facility-Administered Medications  Medication Dose Route Frequency Provider Last Rate Last Dose  . acetaminophen (TYLENOL) tablet 650 mg  650 mg Oral Q6H PRN Mordecai Maes, NP      . alum & mag hydroxide-simeth (MAALOX/MYLANTA) 200-200-20 MG/5ML suspension 30 mL  30 mL Oral Q4H PRN Mordecai Maes, NP      . cloNIDine (CATAPRES) tablet 0.2 mg  0.2 mg Oral Q4H PRN Johnn Hai, MD      . feeding supplement (ENSURE ENLIVE) (ENSURE ENLIVE) liquid 237 mL  237 mL Oral BID BM Johnn Hai, MD      . LORazepam (ATIVAN) tablet 1 mg  1 mg Oral TID Johnn Hai, MD   1 mg at 02/28/19 0816  . prenatal multivitamin tablet 1 tablet  1 tablet Oral Daily Johnn Hai, MD   1 tablet at 02/28/19 [phone]  . sodium chloride tablet 2 g  2 g Oral BID WC Johnn Hai, MD   2 g at 02/28/19 0816  . temazepam (RESTORIL) capsule 30 mg  30 mg Oral QHS Johnn Hai, MD   30 mg at 02/27/19 2239   PTA Medications: Medications Prior to Admission  Medication Sig Dispense Refill Last Dose  . ALPRAZolam (NIRAVAM) 0.5 MG dissolvable tablet Take 0.5 mg by mouth daily as needed for anxiety.      Marland Kitchen amLODipine (NORVASC) 5 MG tablet Take 5 mg by mouth daily.     . busPIRone (BUSPAR) 10 MG tablet Take 20 mg by mouth 3 (three) times daily.      . hydrochlorothiazide (HYDRODIURIL) 12.5 MG tablet Take 12.5 mg by mouth daily.     Marland Kitchen LORazepam (ATIVAN) 1 MG tablet Take 1 mg by mouth 2 (two) times a day.     . losartan (COZAAR) 100 MG tablet Take 100 mg by mouth daily.   3   . metoprolol succinate (TOPROL-XL) 25 MG 24 hr tablet Take 25 mg by mouth daily.     Marland Kitchen oxyCODONE (ROXICODONE) 15 MG immediate release tablet Take 15 mg by mouth every 6 (six) hours  as needed for pain.     . sildenafil (REVATIO) 20 MG tablet Take 60-100 mg by mouth daily as needed (for erectile dysfunction).      . zaleplon (SONATA) 10 MG capsule Take 10 mg by mouth at bedtime as needed (For early morning awakening.).      Marland Kitchen zolpidem (AMBIEN CR) 12.5 MG CR tablet Take 12.5 mg by mouth at bedtime.        Patient Stressors: Financial difficulties Health problems Medication change or noncompliance  Patient Strengths: Average or above average intelligence Communication skills Supportive family/friends  Treatment Modalities: Medication Management, Group therapy, Case management,  1 to 1 session with clinician, Psychoeducation, Recreational therapy.   Physician Treatment Plan for Primary Diagnosis: <principal problem not specified> Long Term Goal(s): Improvement in symptoms so as ready for discharge Improvement in symptoms so as ready for discharge   Short Term Goals: Ability to verbalize feelings will improve Ability to disclose and discuss suicidal ideas Ability to demonstrate self-control will improve Ability to identify and develop effective coping behaviors will improve Ability to demonstrate self-control will improve Ability to identify and develop effective coping behaviors will improve Ability to maintain clinical measurements within normal  limits will improve  Medication Management: Evaluate patient's response, side effects, and tolerance of medication regimen.  Therapeutic Interventions: 1 to 1 sessions, Unit Group sessions and Medication administration.  Evaluation of Outcomes: Not Met  Physician Treatment Plan for Secondary Diagnosis: Active Problems:   MDD (major depressive disorder)  Long Term Goal(s): Improvement in symptoms so as ready for discharge Improvement in symptoms so as ready for discharge   Short Term Goals: Ability to verbalize feelings will improve Ability to disclose and discuss suicidal ideas Ability to demonstrate self-control  will improve Ability to identify and develop effective coping behaviors will improve Ability to demonstrate self-control will improve Ability to identify and develop effective coping behaviors will improve Ability to maintain clinical measurements within normal limits will improve     Medication Management: Evaluate patient's response, side effects, and tolerance of medication regimen.  Therapeutic Interventions: 1 to 1 sessions, Unit Group sessions and Medication administration.  Evaluation of Outcomes: Not Met   RN Treatment Plan for Primary Diagnosis: <principal problem not specified> Long Term Goal(s): Knowledge of disease and therapeutic regimen to maintain health will improve  Short Term Goals: Ability to participate in decision making will improve, Ability to verbalize feelings will improve, Ability to disclose and discuss suicidal ideas, Ability to identify and develop effective coping behaviors will improve and Compliance with prescribed medications will improve  Medication Management: RN will administer medications as ordered by provider, will assess and evaluate patient's response and provide education to patient for prescribed medication. RN will report any adverse and/or side effects to prescribing provider.  Therapeutic Interventions: 1 on 1 counseling sessions, Psychoeducation, Medication administration, Evaluate responses to treatment, Monitor vital signs and CBGs as ordered, Perform/monitor CIWA, COWS, AIMS and Fall Risk screenings as ordered, Perform wound care treatments as ordered.  Evaluation of Outcomes: Not Met   LCSW Treatment Plan for Primary Diagnosis: <principal problem not specified> Long Term Goal(s): Safe transition to appropriate next level of care at discharge, Engage patient in therapeutic group addressing interpersonal concerns.  Short Term Goals: Engage patient in aftercare planning with referrals and resources and Increase skills for wellness and  recovery  Therapeutic Interventions: Assess for all discharge needs, 1 to 1 time with Social worker, Explore available resources and support systems, Assess for adequacy in community support network, Educate family and significant other(s) on suicide prevention, Complete Psychosocial Assessment, Interpersonal group therapy.  Evaluation of Outcomes: Not Met   Progress in Treatment: Attending groups: No. Participating in groups: No. Taking medication as prescribed: Yes. Toleration medication: Yes. Family/Significant other contact made: No, will contact:  will contact if given consent to contact Patient understands diagnosis: Yes. Discussing patient identified problems/goals with staff: Yes. Medical problems stabilized or resolved: No. Denies suicidal/homicidal ideation: Yes. Issues/concerns per patient self-inventory: No. Other:   New problem(s) identified: No, Describe:  None  New Short Term/Long Term Goal(s): Medication stabilization, elimination of SI thoughts, and development of a comprehensive mental wellness plan.   Patient Goals:  "Organize my life again. Get back to working and get back to a loving relationship with my wife"  Discharge Plan or Barriers: CSW will continue to follow up for appropriate referrals and possible discharge planning  Reason for Continuation of Hospitalization: Anxiety Depression Medical Issues Medication stabilization  Estimated Length of Stay: 3-5 days  Attendees: Patient: 02/28/2019   Physician: Dr. Johnn Hai, MD 02/28/2019  Nursing: Legrand Como, RN 02/28/2019  RN Care Manager: 02/28/2019   Social Worker: Ardelle Anton, LCSW 02/28/2019   Recreational  Therapist:  02/28/2019  Other:  02/28/2019  Other:  02/28/2019   Other: 02/28/2019        Scribe for Treatment Team: Trecia Rogers, LCSW 02/28/2019 9:58 AM

## 2019-02-28 NOTE — Progress Notes (Signed)
Recreation Therapy Notes  INPATIENT RECREATION THERAPY ASSESSMENT  Patient Details Name: Brad [name] MRN: [id number] DOB: 06-Dec-1955 Today's Date: 02/28/2019       Information Obtained From: Patient  Able to Participate in Assessment/Interview: Yes  Patient Presentation: Alert  Reason for Admission (Per Patient): Other (Comments)(Pt stated [place])  Patient Stressors: Relationship, Work, Other (Comment)(Finances, Stopped paying the bills)  Coping Skills:   Isolation, TV, Sports, Arguments, Music, Exercise, Meditate, Deep Breathing, Talk, Read  Leisure Interests (2+):  Music - Play instrument, Community - Other (Comment), Exercise - Lifting Weights, Nature - Hiking(Gym, Traveling)  Frequency of Recreation/Participation: Weekly(Pt stated he travels and hikes every few months.)  Awareness of Community Resources:  Yes  Community Resources:  Gym, Patent examiner, Engineer, drilling, Other (Comment)(Hiking trails)  Current Use: Yes  If no, Barriers?:    Expressed Interest in [place]: No  [employer] of Residence:  Investment banker, corporate  Patient Main Form of Transportation: Musician  Patient Strengths:  Empathic person; Helpful  Patient Identified Areas of Improvement:  Standing up for self; Not being so hard headed  Patient Goal for Hospitalization:  "to get my life together, pay my bills and enjoy life with my wife"  Current SI (including self-harm):  No  Current HI:  No  Current AVH: No  Staff Intervention Plan: Group Attendance, Collaborate with Interdisciplinary Treatment Team  Consent to Intern Participation: N/A    Victorino Sparrow, LRT/CTRS  Victorino Sparrow A 02/28/2019, 12:19 PM

## 2019-02-28 NOTE — Progress Notes (Signed)
[facility] MD Progress Note  02/28/2019 10:59 AM Brad AllJohn C [name]  MRN:  [id number] Subjective:   Showing some cognitive improvement today alert oriented to person place day date situation conversant less obsessive overall still expressing some of the same concerns.  Denies thoughts of self-harm.  Again showing improvement short-term memory somewhat improved 3 of 3 and 1 of 3 Principal Problem: conflation of medical and psychosocial stressors leading to dysphoria/obsessiveness and mental status changes Diagnosis: Active Problems:   MDD (major depressive disorder)  Total Time spent with patient: 20 minutes  Past Medical History:  Past Medical History:  Diagnosis Date  . Allergy   . Anxiety   . Depression   . DJD (degenerative joint disease), cervical    postiton with pillow under knees, cant turn neck   . Dysentery, amebic, acute 1981  . GERD (gastroesophageal reflux disease)   . H/O bronchitis   . H/O malaria 1984  . Hearing loss    bilateral   . Hypertension    labile Blood pressure  . Inguinal hernia   . Insomnia    early morning awakening  . MVP (mitral valve prolapse)    "no problems"  . Perianal pain   . Personal history of colonic polyps   . Tinnitus    right ear    Past Surgical History:  Procedure Laterality Date  . CARPAL TUNNEL RELEASE  10/06, 5/10   right wrist   . CARPAL TUNNEL RELEASE  3/10   left wrist   . cervical spine discectomy   09/2005  . COLONOSCOPY WITH PROPOFOL N/A 10/21/2015   Procedure: COLONOSCOPY WITH PROPOFOL;  Surgeon: Charolett BumpersMartin K Johnson, MD;  Location: [facility];  Service: Endoscopy;  Laterality: N/A;  . INGUINAL HERNIA REPAIR  08/16/2011   Procedure: HERNIA REPAIR INGUINAL ADULT;  Surgeon: Valarie MerinoMatthew B Martin, MD;  Location: [facility];  Service: General;  Laterality: Left;  . INGUINAL HERNIA REPAIR Right 05/03/2014   Procedure: OPEN RIGHT INGUINAL HERNIA REPAIR WITH MESH;  Surgeon: Wenda LowMatt Martin, MD;  Location: [facility];  Service: General;   Laterality: Right;  . INSERTION OF MESH Right 05/03/2014   Procedure: INSERTION OF MESH;  Surgeon: Wenda LowMatt Martin, MD;  Location: [facility];  Service: General;  Laterality: Right;  . TONSILLECTOMY  1960   Family History:  Family History  Problem Relation Age of Onset  . Cancer Mother        breast  . Intracerebral hemorrhage Father     Social History:  Social History   Substance and Sexual Activity  Alcohol Use Yes   Comment: 2 wine daily     Social History   Substance and Sexual Activity  Drug Use Yes  . Types: Marijuana   Comment: weekend use    Social History   Socioeconomic History  . Marital status: Married    Spouse name: Not on file  . Number of children: 2  . Years of education: Not on file  . Highest education level: Professional school degree (e.g., MD, DDS, DVM, JD)  Occupational History  . Occupation: psychologist  Social Needs  . Financial resource strain: Not on file  . Food insecurity    Worry: Not on file    Inability: Not on file  . Transportation needs    Medical: Not on file    Non-medical: Not on file  Tobacco Use  . Smoking status: Never Smoker  . Smokeless tobacco: Never Used  Substance and Sexual Activity  . Alcohol use:  Yes    Comment: 2 wine daily  . Drug use: Yes    Types: Marijuana    Comment: weekend use  . Sexual activity: Yes  Lifestyle  . Physical activity    Days per week: Not on file    Minutes per session: Not on file  . Stress: Not on file  Relationships  . Social Musicianconnections    Talks on phone: Not on file    Gets together: Not on file    Attends religious service: Not on file    Active member of club or organization: Not on file    Attends meetings of clubs or organizations: Not on file    Relationship status: Not on file  Other Topics Concern  . Not on file  Social History Narrative   Lives with wife and son in a 3 story home.  His daughter passed away from drug overdose.  He is a self employed Counsellorclinical psychologist.      Sleep: Good  Appetite:  Good  Current Medications: Current Facility-Administered Medications  Medication Dose Route Frequency Provider Last Rate Last Dose  . acamprosate (CAMPRAL) tablet 666 mg  666 mg Oral TID WC Malvin JohnsFarah, Dessire Grimes, MD      . acetaminophen (TYLENOL) tablet 650 mg  650 mg Oral Q6H PRN Denzil Magnusonhomas, Lashunda, NP      . alum & mag hydroxide-simeth (MAALOX/MYLANTA) 200-200-20 MG/5ML suspension 30 mL  30 mL Oral Q4H PRN Denzil Magnusonhomas, Lashunda, NP      . cloNIDine (CATAPRES) tablet 0.2 mg  0.2 mg Oral Q4H PRN Malvin JohnsFarah, Ferris Fielden, MD      . feeding supplement (ENSURE ENLIVE) (ENSURE ENLIVE) liquid 237 mL  237 mL Oral BID BM Malvin JohnsFarah, Estefany Goebel, MD      . LORazepam (ATIVAN) tablet 1 mg  1 mg Oral TID Malvin JohnsFarah, Ernest Orr, MD   1 mg at 02/28/19 0816  . prenatal multivitamin tablet 1 tablet  1 tablet Oral Daily Malvin JohnsFarah, Riggin Cuttino, MD   1 tablet at 02/28/19 [phone]  . sodium chloride tablet 2 g  2 g Oral BID WC Malvin JohnsFarah, Nyajah Hyson, MD   2 g at 02/28/19 0816  . temazepam (RESTORIL) capsule 30 mg  30 mg Oral QHS Malvin JohnsFarah, Jimel Myler, MD   30 mg at 02/27/19 2239    Lab Results:  Results for orders placed or performed during the hospital encounter of 02/27/19 (from the past 48 hour(s))  Hemoglobin A1c     Status: None   Collection Time: 02/28/19  6:24 AM  Result Value Ref Range   Hgb A1c MFr Bld 5.5 4.8 - 5.6 %    Comment: (NOTE) Pre diabetes:          5.7%-6.4% Diabetes:              >6.4% Glycemic control for   <7.0% adults with diabetes    Mean Plasma Glucose 111.15 mg/dL    Comment: Performed at [facility], [address] [place], [place] [address]  Lipid panel     Status: None   Collection Time: 02/28/19  6:24 AM  Result Value Ref Range   Cholesterol 175 0 - 200 mg/dL   Triglycerides 29 <409<150 mg/dL   HDL 78 >81>40 mg/dL   Total CHOL/HDL Ratio 2.2 RATIO   VLDL 6 0 - 40 mg/dL   LDL Cholesterol 91 0 - 99 mg/dL    Comment:        Total Cholesterol/HDL:CHD Risk Coronary Heart Disease Risk Table  Men    Women  1/2 Average Risk   3.4   3.3  Average Risk       5.0   4.4  2 X Average Risk   9.6   7.1  3 X Average Risk  23.4   11.0        Use the calculated Patient Ratio above and the CHD Risk Table to determine the patient's CHD Risk.        ATP III CLASSIFICATION (LDL):  <100     mg/dL   Optimal  [id number]  mg/dL   Near or Above                    Optimal  130-159  mg/dL   Borderline  [id number]  mg/dL   High  >811>190     mg/dL   Very High Performed at [facility], [address] [place], [place] [address]   TSH     Status: None   Collection Time: 02/28/19  6:24 AM  Result Value Ref Range   TSH 0.604 0.350 - 4.500 uIU/mL    Comment: Performed by a 3rd Generation assay with a functional sensitivity of <=0.01 uIU/mL. Performed at [facility], [address] [place], [place] [address]     Blood Alcohol level:  Lab Results  Component Value Date   ETH <10 02/26/2019   ETH <10 02/22/2019    Metabolic Disorder Labs: Lab Results  Component Value Date   HGBA1C 5.5 02/28/2019   MPG 111.15 02/28/2019   No results found for: PROLACTIN Lab Results  Component Value Date   CHOL 175 02/28/2019   TRIG 29 02/28/2019   HDL 78 02/28/2019   CHOLHDL 2.2 02/28/2019   VLDL 6 02/28/2019   LDLCALC 91 02/28/2019   LDLCALC (H) 02/02/2008    104        Total Cholesterol/HDL:CHD Risk Coronary Heart Disease Risk Table                     Men   Women  1/2 Average Risk   3.4   3.3    Physical Findings: AIMS:  , ,  ,  ,    CIWA:    COWS:     Musculoskeletal: Strength & Muscle Tone: within normal limits Gait & Station: normal Patient leans: N/A  Psychiatric Specialty Exam: Physical Exam  ROS  Blood pressure 105/84, pulse (!) 58, temperature 98.8 F (37.1 C), temperature source Oral, resp. rate 18, height 5\' 10"  (1.778 m), weight 58.5 kg, SpO2 100 %.Body mass index is 18.51 kg/m.  General Appearance: Casual  Eye Contact:  nl  Speech:  Clear and  Coherent  Volume:  Decreased  Mood:  Anxious and Dysphoric  Affect:  Congruent  Thought Process:  Goal Directed and Descriptions of Associations: Circumstantial  Orientation:  Full (Time, Place, and Person)  Thought Content:  Logical  Suicidal Thoughts:  No  Homicidal Thoughts:  No  Memory:  Immediate;   Good  Judgement:  Good  Insight:  Good  Psychomotor Activity:  Normal  Concentration:  Concentration: Fair  Recall:  Poor  Fund of Knowledge:  Fair  Language:  Fair  Akathisia:  Negative  Handed:  Right  AIMS (if indicated):     Assets:  Financial Resources/Insurance Housing Intimacy Leisure Time Social Support Talents/Skills  ADL's:  Intact  Cognition:  WNL  Sleep:  Number of Hours: 6.5     Treatment  Plan Summary: Daily contact with patient to assess and evaluate symptoms and progress in treatment, Medication management and Plan Continue sodium supplementation hold diuretics blood pressure normal continue Lorazepam sleep aid as well patient request Campral for tinnitus which we had discussed do not want to confuse the issue but this should be a benign enough addition  Kylyn Mcdade, MD 02/28/2019, 10:59 AM

## 2019-03-01 LAB — COMPREHENSIVE METABOLIC PANEL
ALT: 48 U/L — ABNORMAL HIGH (ref 0–44)
AST: 39 U/L (ref 15–41)
Albumin: 3.8 g/dL (ref 3.5–5.0)
Alkaline Phosphatase: 89 U/L (ref 38–126)
Anion gap: 6 (ref 5–15)
BUN: 17 mg/dL (ref 8–23)
CO2: 26 mmol/L (ref 22–32)
Calcium: 8.7 mg/dL — ABNORMAL LOW (ref 8.9–10.3)
Chloride: 102 mmol/L (ref 98–111)
Creatinine, Ser: 0.76 mg/dL (ref 0.61–1.24)
GFR calc Af Amer: >60 mL/min (ref >60)
GFR calc non Af Amer: >60 mL/min (ref >60)
Glucose, Bld: 105 mg/dL — ABNORMAL HIGH (ref 70–99)
Potassium: 3.5 mmol/L (ref 3.5–5.1)
Sodium: 134 mmol/L — ABNORMAL LOW (ref 135–145)
Total Bilirubin: 0.5 mg/dL (ref 0.3–1.2)
Total Protein: 6.2 g/dL — ABNORMAL LOW (ref 6.5–8.1)

## 2019-03-01 MED ORDER — LACTULOSE ENEMA
300.0000 mL | Freq: Once | ORAL | Status: DC
  Filled 2019-03-01: qty 300

## 2019-03-01 MED ORDER — SODIUM CHLORIDE 1 G PO TABS
2.0000 g | ORAL_TABLET | Freq: Two times a day (BID) | ORAL | Status: DC
  Filled 2019-03-01 (×6): qty 2

## 2019-03-01 NOTE — Progress Notes (Signed)
Nursing Progress Note: 7p-7a D: Pt currently presents with a preoccupied/persecutory affect and behavior. Interacting appropriately with the milieu. Pt reports good sleep during the previous night with current medication regimen.  A: Pt provided with medications per providers orders. Pt's labs and vitals were monitored throughout the night. Pt supported emotionally and encouraged to express concerns and questions. Pt educated on medications.  R: Pt's safety ensured with 15 minute and environmental checks. Pt currently denies SI, HI, and AVH. Pt verbally contracts to seek staff if SI,HI, or AVH occurs and to consult with staff before acting on any harmful thoughts. Will continue to monitor.   [place] NOVEL CORONAVIRUS (COVID-19) DAILY CHECK-OFF SYMPTOMS - answer yes or no to each - every day NO YES  Have you had a fever in the past 24 hours?  . Fever (Temp > 37.80C / 100F) X   Have you had any of these symptoms in the past 24 hours? . New Cough .  Sore Throat  .  Shortness of Breath .  Difficulty Breathing .  Unexplained Body Aches   X   Have you had any one of these symptoms in the past 24 hours not related to allergies?   . Runny Nose .  Nasal Congestion .  Sneezing   X   If you have had runny nose, nasal congestion, sneezing in the past 24 hours, has it worsened?  X   EXPOSURES - check yes or no X   Have you traveled outside the state in the past 14 days?  X   Have you been in contact with someone with a confirmed diagnosis of COVID-19 or PUI in the past 14 days without wearing appropriate PPE?  X   Have you been living in the same home as a person with confirmed diagnosis of COVID-19 or a PUI (household contact)?    X   Have you been diagnosed with COVID-19?    X              What to do next: Answered NO to all: Answered YES to anything:   Proceed with unit schedule Follow the [facility] Inpatient Flowsheet.
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
## 2019-03-01 NOTE — Plan of Care (Signed)
Progress note  D: pt found in the hallway; compliant with medication administration. Pt is still anxious and guarded. Pt is hesitant with some parts of his treatment, especially when considering his constipation and hernia. Pt is preoccupied with this but quick to deflect to seeing other providers once he leaves. Pt declined his imaging and enema. Pt also seems unappreciative of certain parts of his medication regimen, scoffing at them at med pass. Pt denies si/hi/ah/vh and verbally agrees to approach staff if these become apparent or before harming himself/others while at [facility]: Pt provided support and encouragement. Pt given medication per protocol and standing orders. Q27m safety checks implemented and continued.  R: Pt safe on the unit. Will continue to monitor.  Pt progressing in the following metrics  Problem: Activity: Goal: Interest or engagement in activities will improve Outcome: Progressing Goal: Sleeping patterns will improve Outcome: Progressing   Problem: Coping: Goal: Ability to verbalize frustrations and anger appropriately will improve Outcome: Progressing Goal: Ability to demonstrate self-control will improve Outcome: Progressing

## 2019-03-01 NOTE — Progress Notes (Signed)
Recreation Therapy Notes  Date: 7.2.20 Time: 1000 Location: 500 Hall Dayroom  Group Topic: Anger Management  Goal Area(s) Addresses:  Patient will identify triggers for anger.  Patient will identify a situation that makes them angry.  Patient will identify what other emotions comes with anger.   Intervention: Worksheet  Activity:  Introduction to Anger Management.  Patients were to identify three situations that lead to anger, what they do when angry and what problems have they run into because of anger.  Education: Anger Management, Discharge Planning   Education Outcome: Acknowledges education/In group clarification offered/Needs additional education.   Clinical Observations/Feedback: Patient did not attend group.     Amaranta Mehl, LRT/CTRS         Rune Mendez A 03/01/2019 10:53 AM 

## 2019-03-01 NOTE — Progress Notes (Signed)
[facility]  03/01/2019 11:45 AM Brad [name]  MRN:  [id number] Subjective:   Patient continues to be somewhat obsessive but certainly more organized he states has not had a significant bowel movement, just minimal bowel movements and this is been going on for several weeks he blames his hernia but my understanding is that his hernia is inguinal and should not be relevant to that. He denies psychotic symptoms still focused on injuries so forth denies wanting to harm self or others still has some cognitive slowing  Student revealed in rounds that the patient may have missed his Lorazepam at least 2 days and that may have induced a seizure I think it may have been both the hyponatremia lowering threshold as well Principal Problem:  Diagnosis: Active Problems:   MDD (major depressive disorder)  Total Time spent with patient: 20 minutes  Past Medical History:  Past Medical History:  Diagnosis Date  . Allergy   . Anxiety   . Depression   . DJD (degenerative joint disease), cervical    postiton with pillow under knees, cant turn neck   . Dysentery, amebic, acute 1981  . GERD (gastroesophageal reflux disease)   . H/O bronchitis   . H/O malaria 1984  . Hearing loss    bilateral   . Hypertension    labile Blood pressure  . Inguinal hernia   . Insomnia    early morning awakening  . MVP (mitral valve prolapse)    "no problems"  . Perianal pain   . Personal history of colonic polyps   . Tinnitus    right ear    Past Surgical History:  Procedure Laterality Date  . CARPAL TUNNEL RELEASE  10/06, 5/10   right wrist   . CARPAL TUNNEL RELEASE  3/10   left wrist   . cervical spine discectomy   09/2005  . COLONOSCOPY WITH PROPOFOL N/A 10/21/2015   Procedure: COLONOSCOPY WITH PROPOFOL;  Surgeon: Charolett BumpersMartin K Johnson, MD;  Location: [facility];  Service: Endoscopy;  Laterality: N/A;  . INGUINAL HERNIA REPAIR  08/16/2011   Procedure: HERNIA REPAIR INGUINAL ADULT;  Surgeon: Valarie MerinoMatthew B  Martin, MD;  Location: [facility];  Service: General;  Laterality: Left;  . INGUINAL HERNIA REPAIR Right 05/03/2014   Procedure: OPEN RIGHT INGUINAL HERNIA REPAIR WITH MESH;  Surgeon: Wenda LowMatt Martin, MD;  Location: [facility];  Service: General;  Laterality: Right;  . INSERTION OF MESH Right 05/03/2014   Procedure: INSERTION OF MESH;  Surgeon: Wenda LowMatt Martin, MD;  Location: [facility];  Service: General;  Laterality: Right;  . TONSILLECTOMY  1960   Family History:  Family History  Problem Relation Age of Onset  . Cancer Mother        breast  . Intracerebral hemorrhage Father    Family Psychiatric  History: neg Social History:  Social History   Substance and Sexual Activity  Alcohol Use Yes   Comment: 2 wine daily     Social History   Substance and Sexual Activity  Drug Use Yes  . Types: Marijuana   Comment: weekend use    Social History   Socioeconomic History  . Marital status: Married    Spouse name: Not on file  . Number of children: 2  . Years of education: Not on file  . Highest education level: Professional school degree (e.g., MD, DDS, DVM, JD)  Occupational History  . Occupation: psychologist  Social Needs  . Financial resource strain: Not on file  .  Food insecurity    Worry: Not on file    Inability: Not on file  . Transportation needs    Medical: Not on file    Non-medical: Not on file  Tobacco Use  . Smoking status: Never Smoker  . Smokeless tobacco: Never Used  Substance and Sexual Activity  . Alcohol use: Yes    Comment: 2 wine daily  . Drug use: Yes    Types: Marijuana    Comment: weekend use  . Sexual activity: Yes  Lifestyle  . Physical activity    Days per week: Not on file    Minutes per session: Not on file  . Stress: Not on file  Relationships  . Social Herbalist on phone: Not on file    Gets together: Not on file    Attends religious service: Not on file    Active member of club or organization: Not on file    Attends  meetings of clubs or organizations: Not on file    Relationship status: Not on file  Other Topics Concern  . Not on file  Social History Narrative   Lives with wife and son in a 3 story home.  His daughter passed away from drug overdose.  He is a self employed Investment banker, operational.     Additional Social History:                         Sleep: Good  Appetite:  Good  Current Medications: Current Facility-Administered Medications  Medication Dose Route Frequency Provider Last Rate Last Dose  . acamprosate (CAMPRAL) tablet 666 mg  666 mg Oral TID WC Johnn Hai, MD   666 mg at 02/28/19 1641  . acetaminophen (TYLENOL) tablet 650 mg  650 mg Oral Q6H PRN Mordecai Maes, NP      . alum & mag hydroxide-simeth (MAALOX/MYLANTA) 200-200-20 MG/5ML suspension 30 mL  30 mL Oral Q4H PRN Mordecai Maes, NP      . cloNIDine (CATAPRES) tablet 0.2 mg  0.2 mg Oral Q4H PRN Johnn Hai, MD      . docusate sodium (COLACE) capsule 200 mg  200 mg Oral BID Johnn Hai, MD   200 mg at 02/28/19 2152  . feeding supplement (ENSURE ENLIVE) (ENSURE ENLIVE) liquid 237 mL  237 mL Oral BID BM Johnn Hai, MD      . lactulose [facility]) enema 200 gm  300 mL Rectal Once Johnn Hai, MD      . LORazepam (ATIVAN) tablet 1 mg  1 mg Oral TID Johnn Hai, MD   1 mg at 03/01/19 0735  . prenatal multivitamin tablet 1 tablet  1 tablet Oral Daily Johnn Hai, MD   1 tablet at 03/01/19 0735  . sodium chloride tablet 2 g  2 g Oral BID WC Johnn Hai, MD   2 g at 03/01/19 0735  . temazepam (RESTORIL) capsule 30 mg  30 mg Oral QHS Johnn Hai, MD   30 mg at 02/28/19 2152    Lab Results:  Results for orders placed or performed during the hospital encounter of 02/27/19 (from the past 48 hour(s))  Hemoglobin A1c     Status: None   Collection Time: 02/28/19  6:24 AM  Result Value Ref Range   Hgb A1c MFr Bld 5.5 4.8 - 5.6 %    Comment: (NOTE) Pre diabetes:          5.7%-6.4% Diabetes:               >  6.4% Glycemic control for   <7.0% adults with diabetes    Mean Plasma Glucose 111.15 mg/dL    Comment: Performed at [facility], [address] [place], [place] [address]  Lipid panel     Status: None   Collection Time: 02/28/19  6:24 AM  Result Value Ref Range   Cholesterol 175 0 - 200 mg/dL   Triglycerides 29 <578<150 mg/dL   HDL 78 >46>40 mg/dL   Total CHOL/HDL Ratio 2.2 RATIO   VLDL 6 0 - 40 mg/dL   LDL Cholesterol 91 0 - 99 mg/dL    Comment:        Total Cholesterol/HDL:CHD Risk Coronary Heart Disease Risk Table                     Men   Women  1/2 Average Risk   3.4   3.3  Average Risk       5.0   4.4  2 X Average Risk   9.6   7.1  3 X Average Risk  23.4   11.0        Use the calculated Patient Ratio above and the CHD Risk Table to determine the patient's CHD Risk.        ATP III CLASSIFICATION (LDL):  <100     mg/dL   Optimal  [id number]  mg/dL   Near or Above                    Optimal  130-159  mg/dL   Borderline  [id number]  mg/dL   High  >401>190     mg/dL   Very High Performed at [facility], [address] [place], [place] [address]   TSH     Status: None   Collection Time: 02/28/19  6:24 AM  Result Value Ref Range   TSH 0.604 0.350 - 4.500 uIU/mL    Comment: Performed by a 3rd Generation assay with a functional sensitivity of <=0.01 uIU/mL. Performed at [facility], [address] [place], [place] [address]     Blood Alcohol level:  Lab Results  Component Value Date   ETH <10 02/26/2019   ETH <10 02/22/2019    Metabolic Disorder Labs: Lab Results  Component Value Date   HGBA1C 5.5 02/28/2019   MPG 111.15 02/28/2019   No results found for: PROLACTIN Lab Results  Component Value Date   CHOL 175 02/28/2019   TRIG 29 02/28/2019   HDL 78 02/28/2019   CHOLHDL 2.2 02/28/2019   VLDL 6 02/28/2019   LDLCALC 91 02/28/2019   LDLCALC (H) 02/02/2008    104        Total Cholesterol/HDL:CHD Risk Coronary Heart  Disease Risk Table                     Men   Women  1/2 Average Risk   3.4   3.3    Physical Findings: AIMS:  , ,  ,  ,    CIWA:    COWS:     Musculoskeletal: Strength & Muscle Tone: within normal limits Gait & Station: normal Patient leans: N/A Low he never had very visible disequilibrium on initial presentation I felt he was struggling slightly to keep his balance normal Psychiatric Specialty Exam: Physical Exam  ROS  Blood pressure 106/82, pulse 61, temperature 97.9 F (36.6 C), resp. rate 18, height 5\' 10"  (1.778 m), weight 58.5 kg, SpO2 100 %.  Body mass index is 18.51 kg/m.  General Appearance: Casual  Eye Contact:  good  Speech:  Clear and Coherent  Volume:  Decreased  Mood:  Dysphoric  Affect:  Constricted  Thought Process:  Goal Directed and Descriptions of Associations: Intact  Orientation:  Full (Time, Place, and Person)  Thought Content:  Obsessions and Rumination  Suicidal Thoughts:  No  Homicidal Thoughts:  No  Memory:  Immediate;   Fair  Judgement:  Fair  Insight:  Fair  Psychomotor Activity:  Normal  Concentration:  Concentration: Fair  Recall:  Fair  Fund of Knowledge:  Good  Language:  Good  Akathisia:  Negative  Handed:  Right  AIMS (if indicated):     Assets:  Communication Skills Desire for Improvement Housing Intimacy Leisure Time Physical Health Resilience Social Support Talents/Skills Transportation  ADL's:  Intact  Cognition:  WNL  Sleep:  Number of Hours: 5.75     Treatment Plan Summary: Daily contact with patient to assess and evaluate symptoms and progress in treatment, Medication management and Plan With regards to depressive symptoms continue B vitamins, with regards to cognitive difficulties and mental status changes, we still attribute them to a combination of stress, small vessel ischemic changes, hyponatremia though mild, and we are addressing them as follows, continue salt tablets for now, continue moderate fluid restriction  just by verbal recommendation only, check KUB regarding abdominal issues, use enema as needed, continue off diuretic blood pressure actually normal.  No change in precautions.  Continue lorazepam as he has a long history of dependency.  His seizure may have been due to missing 2-1/2 days to 3 days of doses  Malvin JohnsFARAH,Rechel Delosreyes, MD 03/01/2019, 11:45 AM

## 2019-03-01 NOTE — B[facility] Group Notes (Signed)
[facility] LCSW Group Therapy Note  Date/Time: 03/01/2019 @ 1:00pm  Type of Therapy/Topic:  Group Therapy:  Feelings about Diagnosis  Participation Level:  Minimal   Mood: Pleasant    Description of Group:    This group will allow patients to explore their thoughts and feelings about diagnoses they have received. Patients will be guided to explore their level of understanding and acceptance of these diagnoses. Facilitator will encourage patients to process their thoughts and feelings about the reactions of others to their diagnosis, and will guide patients in identifying ways to discuss their diagnosis with significant others in their lives. This group will be process-oriented, with patients participating in exploration of their own experiences as well as giving and receiving support and challenge from other group members.   Therapeutic Goals: 1. Patient will demonstrate understanding of diagnosis as evidence by identifying two or more symptoms of the disorder:  2. Patient will be able to express two feelings regarding the diagnosis 3. Patient will demonstrate ability to communicate their needs through discussion and/or role plays  Summary of Patient Progress:    Pt did attend group therapy. Pt stated that he does not have any current mental health concerns but that he does have physical health concerns. Pt was able to communicate his feelings around the feelings of having a mental health diagnosis which are: people often feel that he complains too much and that sometimes his mental health excuses behaviors that he may possess. Pt then fell asleep until the end of group.     Therapeutic Modalities:   Cognitive Behavioral Therapy Brief Therapy Feelings Identification   Ardelle Anton, LCSW

## 2019-03-02 MED ORDER — SODIUM CHLORIDE 1 G PO TABS: 1.0000 g | ORAL_TABLET | Freq: Two times a day (BID) | ORAL | 0 refills | Status: AC

## 2019-03-02 MED ORDER — CEREFOLIN NAC 6-2-600 MG PO TABS: ORAL_TABLET | ORAL | 0 refills | Status: DC

## 2019-03-02 MED ORDER — PRENATAL MULTIVITAMIN CH: 1.0000 | ORAL_TABLET | Freq: Every day | ORAL | 1 refills | Status: DC

## 2019-03-02 MED ORDER — AMLODIPINE BESYLATE 5 MG PO TABS: 10.0000 mg | ORAL_TABLET | Freq: Every day | ORAL | 1 refills | Status: AC

## 2019-03-02 MED ORDER — MEMANTINE HCL 10 MG PO TABS: 10.0000 mg | ORAL_TABLET | Freq: Two times a day (BID) | ORAL | 2 refills | Status: DC

## 2019-03-02 NOTE — B[facility] Suicide Risk Assessment (Signed)
[facility] INPATIENT:  Family/Significant Other Suicide Prevention Education  Suicide Prevention Education:  Education Completed; wife, Brad [name] has been identified by the patient as the family member/significant other with whom the patient will be residing, and identified as the person(s) who will aid the patient in the event of a mental health crisis (suicidal ideations/suicide attempt).  With written consent from the patient, the family member/significant other has been provided the following suicide prevention education, prior to the and/or following the discharge of the patient.  The suicide prevention education provided includes the following:  Suicide risk factors  Suicide prevention and interventions  National Suicide Hotline telephone number  [facility] assessment telephone number  [facility] Emergency Assistance [place] and/or Residential Mobile Crisis Unit telephone number  Request made of family/significant other to:  Remove weapons (e.g., guns, rifles, knives), all items previously/currently identified as safety concern.    Remove drugs/medications (over-the-counter, prescriptions, illicit drugs), all items previously/currently identified as a safety concern.  The family member/significant other verbalizes understanding of the suicide prevention education information provided.  The family member/significant other agrees to remove the items of safety concern listed above.  Wife called CSW back, she says she is aware of discharge and is okay with the patient coming home today. She has no concerns or questions and will pick him up at 1:30pm  Joellen Jersey 03/02/2019, 9:41 AM

## 2019-03-02 NOTE — Progress Notes (Signed)
Pt discharged to lobby. Pt was stable and appreciative at that time. All papers and prescriptions were given and valuables returned. Verbal understanding expressed. Denies SI/HI and A/VH. Pt given opportunity to express concerns and ask questions.  

## 2019-03-02 NOTE — Progress Notes (Signed)
Recreation Therapy Notes  Date: 7.3.20 Time: 1000 Location: 500 Hall Dayroom  Group Topic: Goal Setting  Goal Area(s) Addresses:  Patient will be able to identify at least 3 life goals.  Patient will be able to identify benefit of investing in life goals.  Patient will be able to identify benefit of setting life goals.   Intervention:  Worksheet  Activity: Lobbyist.  Pt will identify goals they wish to accomplish in the next week, month, year and five years.  Pt will also identify obstacles to reaching goals, what they need to achieve goals and what they can start doing now to work towards goals.  Education:  Discharge Planning, Radiographer, therapeutic, Leisure Education   Education Outcome: Acknowledges Education/In Group Clarification Provided/Needs Additional Education  Clinical Observations:  Pt did not attend group.    Victorino Sparrow, LRT/CTRS         Ria Comment, Yeila Morro A 03/02/2019 10:59 AM
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
## 2019-03-02 NOTE — Progress Notes (Signed)
Recreation Therapy Notes  INPATIENT RECREATION TR PLAN  Patient Details Name: DARELLE KINGS MRN: [id number] DOB: December 10, 1955 Today's Date: 03/02/2019  Rec Therapy Plan Is patient appropriate for Therapeutic Recreation?: Yes Treatment times per week: about 3 days Estimated Length of Stay: 5-7 days TR Treatment/Interventions: Group participation (Comment)  Discharge Criteria Pt will be discharged from therapy if:: Discharged Treatment plan/goals/alternatives discussed and agreed upon by:: Patient/family  Discharge Summary Short term goals set: See patient care plan Short term goals met: Not met Progress toward goals comments: Groups attended Which groups?: Other (Comment)(Team building) Reason goals not met: Pt attended one group session. Therapeutic equipment acquired: N/A Reason patient discharged from therapy: Discharge from hospital Pt/family agrees with progress & goals achieved: Yes Date patient discharged from therapy: 03/02/19     Victorino Sparrow, LRT/CTRS  Ria Comment, Trena Dunavan A 03/02/2019, 11:04 AM

## 2019-03-02 NOTE — Discharge Summary (Signed)
Physician Discharge Summary Note  Patient:  Brad [name] is an 63 y.o., male MRN:  [id number] DOB:  Jul 24, 1956 Patient phone:  [phone] (home)  Patient address:   [address] [place] [address],  Total Time spent with patient: 45 minutes  Date of Admission:  02/27/2019 Date of Discharge: 03/02/2019  Reason for Admission:    This is the third psychiatric admission overall, the second this month, for Dr. Huntley EstellePogue, and baseline a high functioning, local therapist.  He is admitted for the continuation of mental status changes, decline in functioning, concerns over delusional believes and obsessiveness, further, recently had a witnessed seizure/fall with some head trauma. A CT scan of the brain on 6/49, yesterday, showed no acute pathology but was described as showing mild small vessel white matter disease/periventricular-  Further, the patient's been treated for generalized anxiety and has chronically taken lorazepam, 1 mg twice a day since 2009.  Further he has had alprazolam for breakthrough anxiety, as well as buspirone on an ongoing basis however he reports, and his wife confirms, that he is not missed many doses of the benzodiazepines, perhaps about a third of his doses but he does not believe he is suffering from benzodiazepine withdrawal because most days he generally takes at least 1 Ativan.  It is unclear whether his seizure was the result of missing doses of benzodiazepines and/or the result of chronic hyponatremia that has been noted in his past medical records, blamed on his antihypertensive diuretics, his sodium yesterday was 128, 5 days ago 129, and a month ago 130.  The patient himself, on mental status exam, is fully cooperative to the best of his ability but his short-term memory is impaired.  He is aware of day and general time he thinks it is already July 1 and has to be reoriented to the fact that is still June 30 -he can recall 3 of 3 objects immediately but 0 of 3 and  is embarrassed by this performance stating that I "got him on a bad day when he cannot do this" and states he has not slept in 4 days. He reports no auditory or visual hallucinations.  With regards to thoughts of harming himself he states that he has gotten to the point where he thought about suicide, no specific plans but then began imagining "what it would be like to be dead" and says "no way" and is able to dispel the thoughts of self destruction.  His wife reports that he was delusional and that he was insistent his wrist was broken and that other health issues were prominent and this is also noted in our recent medical records however the patient rationalizes this when I show him the x-ray report saying that his wrist x-rays are normal, he states "I been to a hand doctor and I know how they do x-rays here" and basically disputes the finding.  Past admissions included a [facility] he states "they wanted to put me on Depakote and Seroquel" he also was [facility] a [facility] states they want to start Seroquel but states they did not stop his Lorazepam.  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  He has been practicing since 1992.  In summary, though he has been coded as depression with psychosis-  I believe his mental status changes are the result of the following issues-  Chronic mild hyponatremia/recent fall and possible concussion/depression with obsessiveness and possible psychosis/delusional  focus on health    Principal Problem: Mental status changes/multifactorial Discharge Diagnoses: Active Problems:   MDD (major depressive disorder)   Past Psychiatric History: Long-term Lorazepam usage  Past Medical History:  Past Medical History:  Diagnosis Date  . Allergy   . Anxiety   . Depression   . DJD (degenerative joint disease), cervical    postiton with pillow under knees, cant turn neck   . Dysentery, amebic, acute 1981  . GERD (gastroesophageal reflux disease)   . H/O bronchitis   . H/O malaria 1984  . Hearing loss    bilateral   . Hypertension    labile Blood pressure  . Inguinal hernia   . Insomnia    early morning awakening  . MVP (mitral valve prolapse)    "no problems"  . Perianal pain   . Personal history of colonic polyps   . Tinnitus    right ear    Past Surgical History:  Procedure Laterality Date  . CARPAL TUNNEL RELEASE  10/06, 5/10   right wrist   . CARPAL TUNNEL RELEASE  3/10   left wrist   . cervical spine discectomy   09/2005  . COLONOSCOPY WITH PROPOFOL N/A 10/21/2015   Procedure: COLONOSCOPY WITH PROPOFOL;  Surgeon: [name], MD;  Location: [facility];  Service: Endoscopy;  Laterality: N/A;  . INGUINAL HERNIA REPAIR  08/16/2011   Procedure: HERNIA REPAIR INGUINAL ADULT;  Surgeon: [name], MD;  Location: [facility];  Service: General;  Laterality: Left;  . INGUINAL HERNIA REPAIR Right 05/03/2014   Procedure: OPEN RIGHT INGUINAL HERNIA REPAIR WITH MESH;  Surgeon: [name], MD;  Location: [facility];  Service: General;  Laterality: Right;  . INSERTION OF MESH Right 05/03/2014   Procedure: INSERTION OF MESH;  Surgeon: [name], MD;  Location: [facility];  Service: General;  Laterality: Right;  . TONSILLECTOMY  1960   Family History:  Family History  Problem Relation Age of Onset  . Cancer Mother        breast  . Intracerebral hemorrhage Father    Family Psychiatric  History: ukn Social History:  Social  History   Substance and Sexual Activity  Alcohol Use Yes   Comment: 2 wine daily     Social History   Substance and Sexual Activity  Drug Use Yes  . Types: Marijuana   Comment: weekend use    Social History   Socioeconomic History  . Marital status: Married    Spouse name: Not on file  . Number of children: 2  . Years of education: Not on file  . Highest education level: Professional school degree (e.g., MD, DDS, DVM, JD)  Occupational History  . Occupation: psychologist  Social Needs  . Financial resource strain: Not on file  . Food insecurity    Worry: Not on file    Inability: Not on file  . Transportation needs    Medical: Not on file    Non-medical: Not on file  Tobacco Use  . Smoking status: Never Smoker  . Smokeless tobacco:  Never Used  Substance and Sexual Activity  . Alcohol use: Yes    Comment: 2 wine daily  . Drug use: Yes    Types: Marijuana    Comment: weekend use  . Sexual activity: Yes  Lifestyle  . Physical activity    Days per week: Not on file    Minutes per session: Not on file  . Stress: Not on file  Relationships  . Social Musicianconnections    Talks on phone: Not on file    Gets together: Not on file    Attends religious service: Not on file    Active member of club or organization: Not on file    Attends meetings of clubs or organizations: Not on file    Relationship status: Not on file  Other Topics Concern  . Not on file  Social History Narrative   Lives with wife and son in a 3 story home.  His daughter passed away from drug overdose.  He is a self employed clinical psychologist.      Hospital Course:    As discussed this was the third psychiatric admission overall, but the first and a long period of time for Dr. Jolyne LoaPoag who is, at his normal baseline a high functioning PhD psychologist in the community.   He presented with somatic obsessions, decline in his level of functioning, and overall mental status changes warranting inpatient  evaluation.   On 6/26 he presented with extreme somatic obsessions that were borderline delusional as far as their extensiveness, requested psychiatry consult, was discharged but represented on 6/29 after a seizure.    This seizure was thought to be the result of missing doses of his long-term lorazepam prescription probably missing about 2-1/2 days when he had the seizure, but also we believe seizure threshold was lowered given his hyponatremia.  Though his hyponatremia was mild, we felt it was best categorized as chronic symptomatic mild hyponatremia, contributing to the mental status changes as well. Further, neuroimaging revealed small vessel ischemic changes/periventricular.  From a psychological standpoint, he was forced to shut his practice due to the pandemic, and could never adapted to telemedicine and therefore lost his income.  He has subsequently lost track of his current client base.  Initially, the patient was somatically focused to the point of delusions, believing his wrist was fractured even though he was shown a negative x-ray report, and he had short-term memory deficits 3 of 3 and 0 of 3 and had a little bit of unsteadiness again I thought this was all related to the principal diagnosis of hyponatremia, but all the other contributing factors were noted and addressed.  We of course stopped his diuretic did moderate fluid restriction added salt tablets, supplemented him with B vitamins.  He initially asked for Campral as a treatment for his tinnitus but then refused the pill when it was ordered and brought him because he feared it would take away his desire for alcohol he elaborated that he liked drinking wine once in a while by his report.  When told it should not change this basic moderate social intake pattern he still refused the Campral after initially asking for a treatment for tinnitus.  He did not feel he needed an antidepressant.  By the date of the second he was refusing  his medications for the most part even the salt tablets, he further continued his Lorazepam and his blood pressure was generally normal for a long period of time without any antihypertensives but at the  time of discharge we did reinstitute his Norvasc and metoprolol   By the date of the second his sodium had recovered to 134 his mental status had shown much improvement, on the morning of the third he was alert fully oriented and cooperative he denied thoughts of harming himself insisted he had no such thoughts, he could recall 3 of 3 objects instantly and 3 of 3 after 30 seconds and several distraction so this aspect of his short-term memory had improved he was also more steady on his feet.  He was still somatically focused, stating that he had some constipation because his hernia needed to be repaired and it was painful for him to strain to go to the bathroom so forth but he was having bowel movements here.  We discussed the implications of his neuroimaging, and I recommended Namenda as well as B12 folate and antioxidants for neuro protection.  He is very close to his baseline but still not 100% but again he is not acutely dangerous requesting discharge and certainly not committable at this point in time.  Further though it is a subtle finding, I think his mental status changes were such that he was somewhat aloof and indifferent to hospitalization for the first few days but once his sodium recovered and he did show an overall cognitive improvement, he developed a rather healthy disdain for being in the hospital with individuals who are psychotic, during the noise was too much that he could not sleep here so forth so he went from indifferent to unhappy about being here which I think is quite healthy.    Musculoskeletal: Strength & Muscle Tone: within normal limits Gait & Station: normal Patient leans: N/A  Psychiatric Specialty Exam: ROS  Blood pressure (!) 123/91, pulse (!) 56, temperature 97.9  F (36.6 C), resp. rate 18, height  (1.778 m), weight 58.5 kg, SpO2 100 %.Body mass index is 18.51 kg/m.  General Appearance: Casual  Eye Contact::  Good  Speech:  Clear and Coherent409  Volume:  Normal  Mood:  Anxious  Affect:  Congruent  Thought Process:  Coherent, Goal Directed and Descriptions of Associations: Tangential  Orientation:  Full (Time, Place, and Person)  Thought Content:  Obsessions  Suicidal Thoughts:  No  Homicidal Thoughts:  No  Memory:  Recent;   Fair 3/3 and 3/3  (!)  Judgement:  Fair  Insight:  Fair  Psychomotor Activity:  Normal  Concentration:  Fair  Recall:  Fair  Fund of Knowledge:Good  Language: Good  Akathisia:  Negative  Handed:  Right  AIMS (if indicated):     Assets:  Communication Skills Intimacy Leisure Time Physical Health Resilience Social Support Talents/Skills  Sleep:  Number of Hours: 6  Cognition: WNL  ADL's:  Intact    Have you used any form of tobacco in the last 30 days? (Cigarettes, Smokeless Tobacco, Cigars, and/or Pipes): No  Has this patient used any form of tobacco in the last 30 days? (Cigarettes, Smokeless Tobacco, Cigars, and/or Pipes) Yes, No  Blood Alcohol level:  Lab Results  Component Value Date   ETH <10 02/26/2019   ETH <10 02/22/2019    Metabolic Disorder Labs:  Lab Results  Component Value Date   HGBA1C 5.5 02/28/2019   MPG 111.15 02/28/2019   No results found for: PROLACTIN Lab Results  Component Value Date   CHOL 175 02/28/2019   TRIG 29 02/28/2019   HDL 78 02/28/2019   CHOLHDL 2.2 02/28/2019   VLDL 6 02/28/2019  LDLCALC 91 02/28/2019   LDLCALC (H) 02/02/2008    104        Total Cholesterol/HDL:CHD Risk Coronary Heart Disease Risk Table                     Men   Women  1/2 Average Risk   3.4   3.3    See Psychiatric Specialty Exam and Suicide Risk Assessment completed by Attending Physician prior to discharge.  Discharge destination:  Home  Is patient on multiple antipsychotic  therapies at discharge:  No   Has Patient had three or more failed trials of antipsychotic monotherapy by history:  No  Recommended Plan for Multiple Antipsychotic Therapies: NA   Allergies as of 03/02/2019      Reactions   Gabapentin Other (See Comments)   Unable to urinate, drowsiness   Trileptal [oxcarbazepine] Other (See Comments)   Bad taste in his mouth      Medication List    STOP taking these medications   ALPRAZolam 0.5 MG dissolvable tablet Commonly known as: NIRAVAM   hydrochlorothiazide 12.5 MG tablet Commonly known as: HYDRODIURIL   losartan 100 MG tablet Commonly known as: COZAAR   oxyCODONE 15 MG immediate release tablet Commonly known as: ROXICODONE   zaleplon 10 MG capsule Commonly known as: SONATA     TAKE these medications     Indication  amLODipine 5 MG tablet Commonly known as: NORVASC Take 2 tablets (10 mg total) by mouth daily. What changed: how much to take  Indication: High Blood Pressure Disorder   BuSpar 10 MG tablet Generic drug: busPIRone Take 20 mg by mouth 3 (three) times daily.  Indication: Anxiety Disorder   Cerefolin NAC 6-2-600 MG Tabs 1 a day  Indication: 21-Hydroxylase Deficiency   LORazepam 1 MG tablet Commonly known as: ATIVAN Take 1 mg by mouth 2 (two) times a day.  Indication: Feeling Anxious   memantine 10 MG tablet Commonly known as: Namenda Take 1 tablet (10 mg total) by mouth 2 (two) times daily.  Indication: Dementia due to Vascular Disease   metoprolol succinate 25 MG 24 hr tablet Commonly known as: TOPROL-XL Take 25 mg by mouth daily.  Indication: High Blood Pressure Disorder   prenatal multivitamin Tabs tablet Take 1 tablet by mouth daily. Start taking on: March 03, 2019  Indication: Vitamin Deficiency   sildenafil 20 MG tablet Commonly known as: REVATIO Take 60-100 mg by mouth daily as needed (for erectile dysfunction).  Indication: Pulmonary Arterial Hypertension   sodium chloride 1 g  tablet Take 1 tablet (1 g total) by mouth 2 (two) times daily with a meal for 3 days.  Indication: Electrolyte Disorder   zolpidem 12.5 MG CR tablet Commonly known as: AMBIEN CR Take 12.5 mg by mouth at bedtime.  Indication: Trouble Sleeping      Follow-up Information    [facility] Follow up.   Specialty: Behavioral Health Why: Social Buyer, retailWorker Assistant will call you on Monday with an appointment with your medication management provider.  Contact information: [address] [place] [address] [phone]        [facility] Follow up.   Why: Due to the holiday office is currently closed. Social Buyer, retailWorker Assistant will call you on Monday with your therapy appointment.  Contact information: [address] [place] [address] [phone]           Regarding diagnostic clarity-his case is summarized  as follows  - mental status changes the results of a conflation of the following: Psychosocial stress at loss of income and career/small vessel ischemic changes and incipient vascular dementia possible/chronic mild hyponatremia, symptomatic nonetheless/excessive somatic focus obsessions and intermittently delusional about his somatic symptoms   SignedMalvin Johns, MD 03/02/2019, 10:53 AM

## 2019-03-02 NOTE — Progress Notes (Signed)
  [facility] Adult Case Management Discharge Plan :  Will you be returning to the same living situation after discharge:  Yes,  home At discharge, do you have transportation home?: Yes,  wife will pick up at 1:30pm Do you have the ability to pay for your medications: Yes,  BCBS insurance.  Release of information consent forms completed and in the chart. Patient to Follow up at: [facility] Follow up.   Specialty: Behavioral Health Why: Social Civil engineer, contracting will call you on Monday with an appointment with your medication management provider.  Contact information: [address] [place] [address] [phone]        [facility] Follow up.   Why: Due to the holiday office is currently closed. Social Civil engineer, contracting will call you on Monday with your therapy appointment.  Contact information: [address] [place] [address] [phone]           Next level of care provider has access to [place] and Suicide Prevention discussed: Yes,  with wife  Have you used any form of tobacco in the last 30 days? (Cigarettes, Smokeless Tobacco, Cigars, and/or Pipes): No  Has patient been referred to the Quitline?: N/A patient is not a smoker  Patient has been referred for addiction treatment: Yes  Joellen Jersey, [place] 03/02/2019, 9:42 AM

## 2019-03-02 NOTE — BHH Suicide Risk Assessment (Signed)
[facility] INPATIENT:  Family/Significant Other Suicide Prevention Education  Suicide Prevention Education:  Contact Attempts: wife, Eyvonne Mechanic [place] [phone] has been identified by the patient as the family member/significant other with whom the patient will be residing, and identified as the person(s) who will aid the patient in the event of a mental health crisis.  With written consent from the patient, two attempts were made to provide suicide prevention education, prior to and/or following the patient's discharge.  We were unsuccessful in providing suicide prevention education.  A suicide education pamphlet was given to the patient to share with family/significant other.   CSW called twice with the assistance of [place] (wife speaks [place] and [place]). There was no option to leave a voicemail.  Date and time of first attempt: 03/02/2019  at 09:25am Date and time of second attempt: 03/29/2019 at 9:30am  Joellen Jersey 03/02/2019, 9:29 AM

## 2019-03-02 NOTE — Plan of Care (Signed)
Pt attended one recreation therapy group session.    Sugar Vanzandt, LRT/CTRS 

## 2019-03-08 DIAGNOSIS — F3112 Bipolar disorder, current episode manic without psychotic features, moderate: Secondary | ICD-10-CM | POA: Diagnosis not present

## 2019-03-08 MED FILL — oxyCODONE HCL 15 MG TABS: 15 | 14 days supply | Qty: 56 | Fill #0

## 2019-03-14 DIAGNOSIS — F3341 Major depressive disorder, recurrent, in partial remission: Secondary | ICD-10-CM | POA: Diagnosis not present

## 2019-03-15 DIAGNOSIS — F3112 Bipolar disorder, current episode manic without psychotic features, moderate: Secondary | ICD-10-CM | POA: Diagnosis not present

## 2019-03-17 ENCOUNTER — Encounter (HOSPITAL_COMMUNITY): Payer: Self-pay

## 2019-03-17 ENCOUNTER — Ambulatory Visit (HOSPITAL_COMMUNITY)
Admission: EM | Admit: 2019-03-17 | Discharge: 2019-03-17 | Disposition: A | Discharge status: Home / Self Care | Payer: BC Managed Care – PPO | Attending: Emergency Medicine | Admitting: Emergency Medicine

## 2019-03-17 ENCOUNTER — Ambulatory Visit (INDEPENDENT_AMBULATORY_CARE_PROVIDER_SITE_OTHER): Payer: BC Managed Care – PPO

## 2019-03-17 ENCOUNTER — Other Ambulatory Visit: Payer: Self-pay

## 2019-03-17 DIAGNOSIS — S60221A Contusion of right hand, initial encounter: Secondary | ICD-10-CM

## 2019-03-17 DIAGNOSIS — S6991XA Unspecified injury of right wrist, hand and finger(s), initial encounter: Secondary | ICD-10-CM | POA: Diagnosis not present
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
## 2019-03-17 NOTE — Discharge Instructions (Addendum)
Your x-ray was normal today.  Take Tylenol as needed for the discomfort.    Follow-up with your primary care provider next week if your symptoms persist.
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
## 2019-03-22 DIAGNOSIS — F3112 Bipolar disorder, current episode manic without psychotic features, moderate: Secondary | ICD-10-CM | POA: Diagnosis not present

## 2019-03-23 DIAGNOSIS — R1032 Left lower quadrant pain: Secondary | ICD-10-CM | POA: Diagnosis not present

## 2019-03-29 DIAGNOSIS — F3112 Bipolar disorder, current episode manic without psychotic features, moderate: Secondary | ICD-10-CM | POA: Diagnosis not present

## 2019-03-30 DIAGNOSIS — M79671 Pain in right foot: Secondary | ICD-10-CM | POA: Diagnosis not present

## 2019-05-28 ENCOUNTER — Other Ambulatory Visit: Payer: Self-pay

## 2019-05-28 DIAGNOSIS — U071 COVID-19: Secondary | ICD-10-CM | POA: Diagnosis not present

## 2019-05-29 LAB — NOVEL CORONAVIRUS, NAA: SARS-CoV-2, NAA: NOT DETECTED

## 2019-05-30 DIAGNOSIS — Z23 Encounter for immunization: Secondary | ICD-10-CM | POA: Diagnosis not present

## 2019-06-01 ENCOUNTER — Ambulatory Visit
Admission: RE | Admit: 2019-06-01 | Discharge: 2019-06-01 | Disposition: A | Discharge status: Home / Self Care | Payer: BC Managed Care – PPO | Source: Ambulatory Visit | Attending: Internal Medicine | Admitting: Internal Medicine

## 2019-06-01 ENCOUNTER — Other Ambulatory Visit: Payer: Self-pay | Admitting: Internal Medicine

## 2019-06-01 DIAGNOSIS — M79671 Pain in right foot: Secondary | ICD-10-CM | POA: Diagnosis not present

## 2019-06-01 DIAGNOSIS — G629 Polyneuropathy, unspecified: Secondary | ICD-10-CM | POA: Diagnosis not present

## 2019-06-01 DIAGNOSIS — M19072 Primary osteoarthritis, left ankle and foot: Secondary | ICD-10-CM | POA: Diagnosis not present

## 2019-06-01 DIAGNOSIS — Z23 Encounter for immunization: Secondary | ICD-10-CM | POA: Diagnosis not present

## 2019-06-01 DIAGNOSIS — M25774 Osteophyte, right foot: Secondary | ICD-10-CM | POA: Diagnosis not present

## 2019-06-01 DIAGNOSIS — M2011 Hallux valgus (acquired), right foot: Secondary | ICD-10-CM | POA: Diagnosis not present

## 2019-06-04 DIAGNOSIS — G629 Polyneuropathy, unspecified: Secondary | ICD-10-CM | POA: Diagnosis not present

## 2019-06-04 DIAGNOSIS — Z79899 Other long term (current) drug therapy: Secondary | ICD-10-CM | POA: Diagnosis not present

## 2019-06-04 DIAGNOSIS — R202 Paresthesia of skin: Secondary | ICD-10-CM | POA: Diagnosis not present

## 2019-06-04 DIAGNOSIS — F3341 Major depressive disorder, recurrent, in partial remission: Secondary | ICD-10-CM | POA: Diagnosis not present

## 2019-06-12 DIAGNOSIS — M25641 Stiffness of right hand, not elsewhere classified: Secondary | ICD-10-CM | POA: Diagnosis not present

## 2019-06-12 DIAGNOSIS — M79641 Pain in right hand: Secondary | ICD-10-CM | POA: Diagnosis not present

## 2019-06-19 MED FILL — oxyCODONE HCL 15 MG TABS: 15 | 14 days supply | Qty: 56 | Fill #0

## 2019-06-22 DIAGNOSIS — R1032 Left lower quadrant pain: Secondary | ICD-10-CM | POA: Diagnosis not present

## 2019-06-27 DIAGNOSIS — M792 Neuralgia and neuritis, unspecified: Secondary | ICD-10-CM | POA: Diagnosis not present

## 2019-06-27 DIAGNOSIS — M5136 Other intervertebral disc degeneration, lumbar region: Secondary | ICD-10-CM | POA: Diagnosis not present

## 2019-07-09 DIAGNOSIS — G629 Polyneuropathy, unspecified: Secondary | ICD-10-CM | POA: Diagnosis not present

## 2019-07-09 DIAGNOSIS — I10 Essential (primary) hypertension: Secondary | ICD-10-CM | POA: Diagnosis not present

## 2019-07-09 DIAGNOSIS — M79671 Pain in right foot: Secondary | ICD-10-CM | POA: Diagnosis not present

## 2019-07-09 DIAGNOSIS — F331 Major depressive disorder, recurrent, moderate: Secondary | ICD-10-CM | POA: Diagnosis not present

## 2019-07-17 DIAGNOSIS — M792 Neuralgia and neuritis, unspecified: Secondary | ICD-10-CM | POA: Diagnosis not present

## 2019-07-18 DIAGNOSIS — R109 Unspecified abdominal pain: Secondary | ICD-10-CM | POA: Diagnosis not present

## 2019-07-18 DIAGNOSIS — G629 Polyneuropathy, unspecified: Secondary | ICD-10-CM | POA: Diagnosis not present

## 2019-07-18 DIAGNOSIS — I10 Essential (primary) hypertension: Secondary | ICD-10-CM | POA: Diagnosis not present

## 2019-07-18 DIAGNOSIS — F331 Major depressive disorder, recurrent, moderate: Secondary | ICD-10-CM | POA: Diagnosis not present

## 2019-07-18 DIAGNOSIS — G6289 Other specified polyneuropathies: Secondary | ICD-10-CM | POA: Diagnosis not present

## 2019-07-19 DIAGNOSIS — S6991XA Unspecified injury of right wrist, hand and finger(s), initial encounter: Secondary | ICD-10-CM | POA: Diagnosis not present

## 2019-07-19 DIAGNOSIS — G5601 Carpal tunnel syndrome, right upper limb: Secondary | ICD-10-CM | POA: Diagnosis not present

## 2019-07-20 ENCOUNTER — Other Ambulatory Visit: Payer: Self-pay | Admitting: Internal Medicine

## 2019-07-20 DIAGNOSIS — R109 Unspecified abdominal pain: Secondary | ICD-10-CM

## 2019-07-28 MED FILL — LORazepam 1 MG TABS: 1 | 30 days supply | Qty: 60 | Fill #0

## 2019-07-31 DIAGNOSIS — M792 Neuralgia and neuritis, unspecified: Secondary | ICD-10-CM | POA: Diagnosis not present

## 2019-08-01 ENCOUNTER — Other Ambulatory Visit: Payer: BC Managed Care – PPO

## 2019-08-01 ENCOUNTER — Ambulatory Visit
Admission: RE | Admit: 2019-08-01 | Discharge: 2019-08-01 | Disposition: A | Discharge status: Home / Self Care | Payer: BC Managed Care – PPO | Source: Ambulatory Visit | Attending: Internal Medicine | Admitting: Internal Medicine

## 2019-08-01 DIAGNOSIS — K7689 Other specified diseases of liver: Secondary | ICD-10-CM | POA: Diagnosis not present

## 2019-08-01 DIAGNOSIS — R109 Unspecified abdominal pain: Secondary | ICD-10-CM

## 2019-08-01 MED ORDER — IOPAMIDOL (ISOVUE-300) INJECTION 61%
100.0000 mL | Freq: Once | INTRAVENOUS | Status: AC | PRN
  Administered 2019-08-01: 100 mL via INTRAVENOUS

## 2019-08-03 DIAGNOSIS — F3341 Major depressive disorder, recurrent, in partial remission: Secondary | ICD-10-CM | POA: Diagnosis not present

## 2019-08-07 DIAGNOSIS — M792 Neuralgia and neuritis, unspecified: Secondary | ICD-10-CM | POA: Diagnosis not present

## 2019-08-10 DIAGNOSIS — M549 Dorsalgia, unspecified: Secondary | ICD-10-CM | POA: Diagnosis not present

## 2019-08-10 DIAGNOSIS — R109 Unspecified abdominal pain: Secondary | ICD-10-CM | POA: Diagnosis not present

## 2019-08-10 DIAGNOSIS — R630 Anorexia: Secondary | ICD-10-CM | POA: Diagnosis not present

## 2019-08-10 DIAGNOSIS — F331 Major depressive disorder, recurrent, moderate: Secondary | ICD-10-CM | POA: Diagnosis not present

## 2019-08-10 DIAGNOSIS — R748 Abnormal levels of other serum enzymes: Secondary | ICD-10-CM | POA: Diagnosis not present

## 2019-08-10 DIAGNOSIS — G629 Polyneuropathy, unspecified: Secondary | ICD-10-CM | POA: Diagnosis not present

## 2019-08-10 DIAGNOSIS — I700 Atherosclerosis of aorta: Secondary | ICD-10-CM | POA: Diagnosis not present

## 2019-08-14 DIAGNOSIS — G5601 Carpal tunnel syndrome, right upper limb: Secondary | ICD-10-CM | POA: Diagnosis not present

## 2019-08-14 DIAGNOSIS — M792 Neuralgia and neuritis, unspecified: Secondary | ICD-10-CM | POA: Diagnosis not present

## 2019-08-14 DIAGNOSIS — M79641 Pain in right hand: Secondary | ICD-10-CM | POA: Diagnosis not present

## 2019-08-20 DIAGNOSIS — H00016 Hordeolum externum left eye, unspecified eyelid: Secondary | ICD-10-CM | POA: Diagnosis not present

## 2019-08-21 DIAGNOSIS — M792 Neuralgia and neuritis, unspecified: Secondary | ICD-10-CM | POA: Diagnosis not present

## 2019-08-28 DIAGNOSIS — M792 Neuralgia and neuritis, unspecified: Secondary | ICD-10-CM | POA: Diagnosis not present

## 2019-10-22 ENCOUNTER — Ambulatory Visit: Payer: BC Managed Care – PPO | Attending: Family

## 2019-10-22 DIAGNOSIS — Z23 Encounter for immunization: Secondary | ICD-10-CM

## 2019-10-22 NOTE — Progress Notes (Signed)
   Covid-19 Vaccination Clinic  Name:  Brad [name]    MRN: [id number] DOB: [birth year]  10/22/2019  Mr. Brad [name] was observed post Covid-19 immunization for 15 minutes without incidence. He was provided with Vaccine Information Sheet and instruction to access the V-Safe system.   Mr. Brad [name] was instructed to call 911 with any severe reactions post vaccine: Marland Kitchen Difficulty breathing  . Swelling of your face and throat  . A fast heartbeat  . A bad rash all over your body  . Dizziness and weakness    Immunizations Administered    Name Date Dose VIS Date Route   Moderna COVID-19 Vaccine 10/22/2019  3:53 PM 0.5 mL 07/31/2019 Intramuscular   Manufacturer: Moderna   Lot: [id number]   NDC: 53748-270-78

## 2019-10-23 DIAGNOSIS — F3341 Major depressive disorder, recurrent, in partial remission: Secondary | ICD-10-CM | POA: Diagnosis not present

## 2019-10-24 DIAGNOSIS — G629 Polyneuropathy, unspecified: Secondary | ICD-10-CM | POA: Diagnosis not present

## 2019-10-24 DIAGNOSIS — I10 Essential (primary) hypertension: Secondary | ICD-10-CM | POA: Diagnosis not present

## 2019-10-24 DIAGNOSIS — I700 Atherosclerosis of aorta: Secondary | ICD-10-CM | POA: Diagnosis not present

## 2019-10-25 DIAGNOSIS — G5601 Carpal tunnel syndrome, right upper limb: Secondary | ICD-10-CM | POA: Diagnosis not present

## 2019-10-25 DIAGNOSIS — M792 Neuralgia and neuritis, unspecified: Secondary | ICD-10-CM | POA: Diagnosis not present

## 2019-11-05 DIAGNOSIS — G5763 Lesion of plantar nerve, bilateral lower limbs: Secondary | ICD-10-CM | POA: Diagnosis not present

## 2019-11-12 DIAGNOSIS — M7751 Other enthesopathy of right foot: Secondary | ICD-10-CM | POA: Diagnosis not present

## 2019-11-12 DIAGNOSIS — M7752 Other enthesopathy of left foot: Secondary | ICD-10-CM | POA: Diagnosis not present

## 2019-11-12 DIAGNOSIS — G5763 Lesion of plantar nerve, bilateral lower limbs: Secondary | ICD-10-CM | POA: Diagnosis not present

## 2019-11-15 DIAGNOSIS — H40013 Open angle with borderline findings, low risk, bilateral: Secondary | ICD-10-CM | POA: Diagnosis not present

## 2019-11-20 ENCOUNTER — Ambulatory Visit: Payer: BC Managed Care – PPO | Attending: Family

## 2019-11-20 DIAGNOSIS — Z23 Encounter for immunization: Secondary | ICD-10-CM

## 2019-11-20 NOTE — Progress Notes (Signed)
   Covid-19 Vaccination Clinic  Name:  Brad [name]    MRN: [id number] DOB: [birth year]  11/20/2019  Brad [name] was observed post Covid-19 immunization for 15 minutes without incident. He was provided with Vaccine Information Sheet and instruction to access the V-Safe system.   Brad [name] was instructed to call 911 with any severe reactions post vaccine: Marland Kitchen Difficulty breathing  . Swelling of face and throat  . A fast heartbeat  . A bad rash all over body  . Dizziness and weakness   Immunizations Administered    Name Date Dose VIS Date Route   Moderna COVID-19 Vaccine 11/20/2019  4:32 PM 0.5 mL 07/31/2019 Intramuscular   Manufacturer: Moderna   Lot: [id number]   NDC: 83094-076-80

## 2019-11-28 ENCOUNTER — Other Ambulatory Visit: Payer: Self-pay

## 2019-11-28 ENCOUNTER — Ambulatory Visit (INDEPENDENT_AMBULATORY_CARE_PROVIDER_SITE_OTHER): Payer: BC Managed Care – PPO | Admitting: Podiatry

## 2019-11-28 ENCOUNTER — Ambulatory Visit (INDEPENDENT_AMBULATORY_CARE_PROVIDER_SITE_OTHER): Payer: BC Managed Care – PPO

## 2019-11-28 DIAGNOSIS — G5763 Lesion of plantar nerve, bilateral lower limbs: Secondary | ICD-10-CM

## 2019-11-28 NOTE — Patient Instructions (Signed)
Pre-Operative Instructions  Congratulations, you have decided to take an important step towards improving your quality of life.  You can be assured that the doctors and staff at [facility] will be with you every step of the way.  Here are some important things you should know:  1. Plan to be at the surgery center/hospital at least 1 (one) hour prior to your scheduled time, unless otherwise directed by the surgical center/hospital staff.  You must have a responsible adult accompany you, remain during the surgery and drive you home.  Make sure you have directions to the surgical center/hospital to ensure you arrive on time. 2. If you are having surgery at [facility] or [facility], you will need a copy of your medical history and physical form from your family physician within one month prior to the date of surgery. We will give you a form for your primary physician to complete.  3. We make every effort to accommodate the date you request for surgery.  However, there are times where surgery dates or times have to be moved.  We will contact you as soon as possible if a change in schedule is required.   4. No aspirin/ibuprofen for one week before surgery.  If you are on aspirin, any non-steroidal anti-inflammatory medications (Mobic, Aleve, Ibuprofen) should not be taken seven (7) days prior to your surgery.  You make take Tylenol for pain prior to surgery.  5. Medications - If you are taking daily heart and blood pressure medications, seizure, reflux, allergy, asthma, anxiety, pain or diabetes medications, make sure you notify the surgery center/hospital before the day of surgery so they can tell you which medications you should take or avoid the day of surgery. 6. No food or drink after midnight the night before surgery unless directed otherwise by surgical center/hospital staff. 7. No alcoholic beverages 24-hours prior to surgery.  No smoking 24-hours prior or 24-hours after  surgery. 8. Wear loose pants or shorts. They should be loose enough to fit over bandages, boots, and casts. 9. Don't wear slip-on shoes. Sneakers are preferred. 10. Bring your boot with you to the surgery center/hospital.  Also bring crutches or a walker if your physician has prescribed it for you.  If you do not have this equipment, it will be provided for you after surgery. 11. If you have not been contacted by the surgery center/hospital by the day before your surgery, call to confirm the date and time of your surgery. 12. Leave-time from work may vary depending on the type of surgery you have.  Appropriate arrangements should be made prior to surgery with your employer. 13. Prescriptions will be provided immediately following surgery by your doctor.  Fill these as soon as possible after surgery and take the medication as directed. Pain medications will not be refilled on weekends and must be approved by the doctor. 14. Remove nail polish on the operative foot and avoid getting pedicures prior to surgery. 15. Wash the night before surgery.  The night before surgery wash the foot and leg well with water and the antibacterial soap provided. Be sure to pay special attention to beneath the toenails and in between the toes.  Wash for at least three (3) minutes. Rinse thoroughly with water and dry well with a towel.  Perform this wash unless told not to do so by your physician.  Enclosed: 1 Ice pack (please put in freezer the night before surgery)   1 Hibiclens skin cleaner     Pre-op instructions  If you have any questions regarding the instructions, please do not hesitate to call our office.  [place]: [address], [place], [place] [address] -- [phone]  [place]: [address] [place], [place] [address] -- [phone]  [place]: [address], [place], [place] [address] -- [phone]   Website: [email or web address] 

## 2019-11-29 ENCOUNTER — Telehealth: Payer: Self-pay

## 2019-11-29 NOTE — Telephone Encounter (Signed)
DOS 12/20/19  NEURECTOMY B/L - 28080  BCBS EFFECTIVE DATE - 08/31/2019    In-Network   Max Per Benefit Period Year-to-Date Remaining     CoInsurance 30%      Deductible $2500.00 $2490.29     Out-Of-Pocket $8550.00 $8236.04  Copay Not Applicable Coinsurance 30%  per  Service Year Authorization Required  No

## 2019-12-03 ENCOUNTER — Ambulatory Visit: Payer: BC Managed Care – PPO | Admitting: Podiatry

## 2019-12-04 ENCOUNTER — Ambulatory Visit: Payer: Self-pay

## 2019-12-05 NOTE — Progress Notes (Signed)
   HPI: 64 y.o. male presenting today as a new patient, referred by Dr. Elijah Birk, with a chief complaint of burning, stabbing, throbbing pain located to the ball of the feet bilaterally that began about 3 years ago. He has been diagnosed with bilateral neuromas. Walking increases the pain. He has taken Lyrica and Meloxicam, received sympathetic nerve blocks and injections for treatment. Marland Kitchentje   Past Medical History:  Diagnosis Date  . Allergy   . Anxiety   . Depression   . DJD (degenerative joint disease), cervical    postiton with pillow under knees, cant turn neck   . Dysentery, amebic, acute 1981  . GERD (gastroesophageal reflux disease)   . H/O bronchitis   . H/O malaria 1984  . Hearing loss    bilateral   . Hypertension    labile Blood pressure  . Inguinal hernia   . Insomnia    early morning awakening  . MVP (mitral valve prolapse)    "no problems"  . Perianal pain   . Personal history of colonic polyps   . Tinnitus    right ear      Physical Exam: General: The patient is alert and oriented x3 in no acute distress.  Dermatology: Skin is warm, dry and supple bilateral lower extremities. Negative for open lesions or macerations.  Vascular: Palpable pedal pulses bilaterally. No edema or erythema noted. Capillary refill within normal limits.  Neurological: Epicritic and protective threshold grossly intact bilaterally.   Musculoskeletal Exam: Sharp pain with palpation of the bilateral 2nd interspace and lateral compression of the metatarsal heads consistent with neuroma.  Positive Lendell Caprice sign with loadbearing of the forefoot.  Radiographic Exam:  Normal osseous mineralization. Joint spaces preserved. No fracture/dislocation/boney destruction.    Assessment: 1.  Morton's neuroma 2nd interspace bilateral feet   Plan of Care:  1. Patient was evaluated. X-Rays reviewed.  2. Today we discussed the conservative versus surgical management of the presenting pathology. The  patient opts for surgical management. All possible complications and details of the procedure were explained. All patient questions were answered. No guarantees were expressed or implied. 3. Authorization for surgery was initiated today. Surgery will consist of excision of Morton's neuromas bilateral 2nd interspaces.  4. Return to clinic one week post op.   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    Felecia Shelling, DPM [facility]  Dr. Felecia Shelling, DPM    [address]                                        [place], [place] [address]                Office [phone]  Fax [phone]

## 2019-12-06 DIAGNOSIS — L821 Other seborrheic keratosis: Secondary | ICD-10-CM | POA: Diagnosis not present

## 2019-12-06 DIAGNOSIS — D2272 Melanocytic nevi of left lower limb, including hip: Secondary | ICD-10-CM | POA: Diagnosis not present

## 2019-12-06 DIAGNOSIS — D225 Melanocytic nevi of trunk: Secondary | ICD-10-CM | POA: Diagnosis not present

## 2019-12-06 DIAGNOSIS — L812 Freckles: Secondary | ICD-10-CM | POA: Diagnosis not present

## 2019-12-19 DIAGNOSIS — K219 Gastro-esophageal reflux disease without esophagitis: Secondary | ICD-10-CM | POA: Diagnosis not present

## 2019-12-19 DIAGNOSIS — K1379 Other lesions of oral mucosa: Secondary | ICD-10-CM | POA: Diagnosis not present

## 2019-12-19 DIAGNOSIS — G5762 Lesion of plantar nerve, left lower limb: Secondary | ICD-10-CM | POA: Diagnosis not present

## 2019-12-19 DIAGNOSIS — G5761 Lesion of plantar nerve, right lower limb: Secondary | ICD-10-CM | POA: Diagnosis not present

## 2019-12-20 ENCOUNTER — Other Ambulatory Visit: Payer: Self-pay | Admitting: Podiatry

## 2019-12-20 DIAGNOSIS — I10 Essential (primary) hypertension: Secondary | ICD-10-CM | POA: Diagnosis not present

## 2019-12-20 DIAGNOSIS — G5762 Lesion of plantar nerve, left lower limb: Secondary | ICD-10-CM | POA: Diagnosis not present

## 2019-12-20 DIAGNOSIS — G5761 Lesion of plantar nerve, right lower limb: Secondary | ICD-10-CM | POA: Diagnosis not present

## 2019-12-20 DIAGNOSIS — G5763 Lesion of plantar nerve, bilateral lower limbs: Secondary | ICD-10-CM | POA: Diagnosis not present

## 2019-12-20 MED ORDER — OXYCODONE-ACETAMINOPHEN 5-325 MG PO TABS: 1.0000 | ORAL_TABLET | Freq: Four times a day (QID) | ORAL | 0 refills | Status: DC | PRN

## 2019-12-20 NOTE — Progress Notes (Signed)
PRN postop 

## 2019-12-21 ENCOUNTER — Other Ambulatory Visit: Payer: Self-pay

## 2019-12-21 ENCOUNTER — Encounter: Payer: Self-pay | Admitting: Podiatry

## 2019-12-21 ENCOUNTER — Ambulatory Visit (INDEPENDENT_AMBULATORY_CARE_PROVIDER_SITE_OTHER): Payer: BC Managed Care – PPO | Admitting: Podiatry

## 2019-12-21 DIAGNOSIS — Z9889 Other specified postprocedural states: Secondary | ICD-10-CM

## 2019-12-21 DIAGNOSIS — G5763 Lesion of plantar nerve, bilateral lower limbs: Secondary | ICD-10-CM

## 2019-12-21 NOTE — Progress Notes (Signed)
Subjective:  Patient ID: Brad [name], male    DOB: 01-31-1956,  MRN: [id number]  Chief Complaint  Patient presents with  . Post-op Problem    DOS 12/20/2019. Pt stated, "Bleeding through dressing on L foot. 10/10 pain bilaterally. Taking the pain medication consistently. I've been walking a lot at home, but I walk on my heels. Wearing surgical shoes".    64 y.o. male returns for post-op check.  He states that he is doing well.  He has bleeding to the left side of the dressing.  Patient is status post bilateral neuroma.  He just wanted to get it evaluated make sure that there was not anything else going on.  He still has pain.  He has been taking his pain medication.  He has been ambulating with bilateral surgical shoe.  He did walk a little bit too much after the surgery which may have led to little bit of bleeding.  He denies any other acute complaints  Review of Systems: Negative except as noted in the HPI. Denies N/V/F/Ch.  Past Medical History:  Diagnosis Date  . Allergy   . Anxiety   . Depression   . DJD (degenerative joint disease), cervical    postiton with pillow under knees, cant turn neck   . Dysentery, amebic, acute 1981  . GERD (gastroesophageal reflux disease)   . H/O bronchitis   . H/O malaria 1984  . Hearing loss    bilateral   . Hypertension    labile Blood pressure  . Inguinal hernia   . Insomnia    early morning awakening  . MVP (mitral valve prolapse)    "no problems"  . Perianal pain   . Personal history of colonic polyps   . Tinnitus    right ear    Current Outpatient Medications:  .  amLODipine (NORVASC) 5 MG tablet, Take 2 tablets (10 mg total) by mouth daily., Disp: 90 tablet, Rfl: 1 .  busPIRone (BUSPAR) 10 MG tablet, Take 20 mg by mouth 3 (three) times daily. , Disp: , Rfl:  .  LORazepam (ATIVAN) 1 MG tablet, Take 1 mg by mouth 2 (two) times a day., Disp: , Rfl:  .  memantine (NAMENDA) 10 MG tablet, Take 1 tablet (10 mg total) by mouth 2 (two)  times daily., Disp: 60 tablet, Rfl: 2 .  Methylfol-Methylcob-Acetylcyst (CEREFOLIN NAC) 6-2-600 MG TABS, 1 a day, Disp: 30 tablet, Rfl: 0 .  metoprolol succinate (TOPROL-XL) 25 MG 24 hr tablet, Take 25 mg by mouth daily., Disp: , Rfl:  .  oxyCODONE-acetaminophen (PERCOCET) 5-325 MG tablet, Take 1 tablet by mouth every 6 (six) hours as needed for severe pain., Disp: 30 tablet, Rfl: 0 .  Prenatal Vit-Fe Fumarate-FA (PRENATAL MULTIVITAMIN) TABS tablet, Take 1 tablet by mouth daily., Disp: 90 tablet, Rfl: 1 .  sildenafil (REVATIO) 20 MG tablet, Take 60-100 mg by mouth daily as needed (for erectile dysfunction). , Disp: , Rfl:  .  zolpidem (AMBIEN CR) 12.5 MG CR tablet, Take 12.5 mg by mouth at bedtime. , Disp: , Rfl:   Social History   Tobacco Use  Smoking Status Never Smoker  Smokeless Tobacco Never Used    Allergies  Allergen Reactions  . Gabapentin Other (See Comments)    Unable to urinate, drowsiness  . Trileptal [Oxcarbazepine] Other (See Comments)    Bad taste in his mouth   Objective:  There were no vitals filed for this visit. There is no height or weight on file to  calculate BMI. Constitutional Well developed. Well nourished.  Vascular Foot warm and well perfused. Capillary refill normal to all digits.   Neurologic Normal speech. Oriented to person, place, and time. Epicritic sensation to light touch grossly present bilaterally.  Dermatologic Skin healing well without signs of infection. Skin edges well coapted without signs of infection.  Orthopedic: Tenderness to palpation noted about the surgical site.   Radiographs: None Assessment:   1. Morton's neuroma of both feet   2. Status post foot surgery    Plan:  Patient was evaluated and treated and all questions answered.  S/p foot surgery bilaterally -Progressing as expected post-operatively. -XR: None -WB Status: Weightbearing as tolerated in surgical shoe bilaterally -Sutures: Left side was evaluated sutures are  intact.  No signs of dehiscence or infection noted. -Medications: None -Foot redressed.  No follow-ups on file.

## 2019-12-25 ENCOUNTER — Encounter: Payer: Self-pay | Admitting: Podiatry

## 2019-12-26 ENCOUNTER — Ambulatory Visit (INDEPENDENT_AMBULATORY_CARE_PROVIDER_SITE_OTHER): Payer: BC Managed Care – PPO

## 2019-12-26 ENCOUNTER — Ambulatory Visit (INDEPENDENT_AMBULATORY_CARE_PROVIDER_SITE_OTHER): Payer: BC Managed Care – PPO | Admitting: Podiatry

## 2019-12-26 ENCOUNTER — Encounter: Payer: Self-pay | Admitting: Podiatry

## 2019-12-26 ENCOUNTER — Other Ambulatory Visit: Payer: Self-pay

## 2019-12-26 VITALS — Temp 97.6°F

## 2019-12-26 DIAGNOSIS — G5763 Lesion of plantar nerve, bilateral lower limbs: Secondary | ICD-10-CM

## 2019-12-26 DIAGNOSIS — G5761 Lesion of plantar nerve, right lower limb: Secondary | ICD-10-CM | POA: Diagnosis not present

## 2019-12-26 DIAGNOSIS — G5762 Lesion of plantar nerve, left lower limb: Secondary | ICD-10-CM | POA: Diagnosis not present

## 2019-12-26 DIAGNOSIS — Z9889 Other specified postprocedural states: Secondary | ICD-10-CM

## 2019-12-28 NOTE — Progress Notes (Signed)
   Subjective:  Patient presents today status post neurectomy bilateral 3rd interspace. DOS: 12/20/2019. He reports a significant amount of pain which he is taking Percocet every six hours for treatment. He is also taking Ibuprofen for the pain. He has been using the post op shoes as directed. There are no worsening factors noted. Patient is here for further evaluation and treatment.    Past Medical History:  Diagnosis Date  . Allergy   . Anxiety   . Depression   . DJD (degenerative joint disease), cervical    postiton with pillow under knees, cant turn neck   . Dysentery, amebic, acute 1981  . GERD (gastroesophageal reflux disease)   . H/O bronchitis   . H/O malaria 1984  . Hearing loss    bilateral   . Hypertension    labile Blood pressure  . Inguinal hernia   . Insomnia    early morning awakening  . MVP (mitral valve prolapse)    "no problems"  . Perianal pain   . Personal history of colonic polyps   . Tinnitus    right ear      Objective/Physical Exam Neurovascular status intact.  Skin incisions appear to be well coapted with sutures and staples intact. No sign of infectious process noted. No dehiscence. No active bleeding noted. Moderate edema noted to the surgical extremity.  Radiographic Exam:  Normal osseous mineralization. Joint spaces preserved. No fracture/dislocation/boney destruction.    Assessment: 1. s/p neurectomy bilateral 3rd interspaces. DOS: 12/20/2019   Plan of Care:  1. Patient was evaluated. X-rays reviewed 2. Dressing changed.  3. Continue using post op shoes.  4. Return to clinic in one week for suture removal.   Son's name is Visual merchandiser.    Felecia Shelling, DPM [facility]  Dr. Felecia Shelling, DPM    [address]                                        [place], [place] [address]                Office [phone]  Fax [phone]

## 2020-01-01 ENCOUNTER — Telehealth: Payer: Self-pay | Admitting: *Deleted

## 2020-01-01 ENCOUNTER — Encounter: Payer: Self-pay | Admitting: Podiatry

## 2020-01-01 NOTE — Telephone Encounter (Signed)
Pt communicated through MyChart of discomfort in the surgery feet and in sent instructions through MyChart on how to treat. Pt requested refill of the oxycodone.

## 2020-01-02 ENCOUNTER — Other Ambulatory Visit: Payer: Self-pay | Admitting: Podiatry

## 2020-01-02 MED ORDER — OXYCODONE-ACETAMINOPHEN 5-325 MG PO TABS: 1.0000 | ORAL_TABLET | Freq: Four times a day (QID) | ORAL | 0 refills | Status: DC | PRN

## 2020-01-02 MED FILL — OXYCODONE-APAP 5-325MG: 5-325 | 8 days supply | Qty: 30 | Fill #0

## 2020-01-02 NOTE — Progress Notes (Signed)
PRN postop 

## 2020-01-02 NOTE — Telephone Encounter (Signed)
Rx sent 

## 2020-01-07 ENCOUNTER — Other Ambulatory Visit: Payer: Self-pay

## 2020-01-07 ENCOUNTER — Ambulatory Visit (INDEPENDENT_AMBULATORY_CARE_PROVIDER_SITE_OTHER): Payer: BC Managed Care – PPO | Admitting: Podiatry

## 2020-01-07 DIAGNOSIS — G5763 Lesion of plantar nerve, bilateral lower limbs: Secondary | ICD-10-CM

## 2020-01-07 DIAGNOSIS — Z9889 Other specified postprocedural states: Secondary | ICD-10-CM

## 2020-01-10 NOTE — Progress Notes (Signed)
   Subjective:  Patient presents today status post neurectomy bilateral 3rd interspace. DOS: 12/20/2019. He states he is doing well overall. He reports an increase in walking lately which has caused a "knot" feeling in the feet. He has been taking Percocet for pain. Patient is here for further evaluation and treatment.    Past Medical History:  Diagnosis Date  . Allergy   . Anxiety   . Depression   . DJD (degenerative joint disease), cervical    postiton with pillow under knees, cant turn neck   . Dysentery, amebic, acute 1981  . GERD (gastroesophageal reflux disease)   . H/O bronchitis   . H/O malaria 1984  . Hearing loss    bilateral   . Hypertension    labile Blood pressure  . Inguinal hernia   . Insomnia    early morning awakening  . MVP (mitral valve prolapse)    "no problems"  . Perianal pain   . Personal history of colonic polyps   . Tinnitus    right ear      Objective/Physical Exam Neurovascular status intact.  Skin incisions appear to be well coapted with sutures and staples intact. No sign of infectious process noted. No dehiscence. No active bleeding noted. Moderate edema noted to the surgical extremity.  Assessment: 1. s/p neurectomy bilateral 3rd interspaces. DOS: 12/20/2019   Plan of Care:  1. Patient was evaluated.  2. Sutures removed.  3. Discontinue using post op shoes. Recommended good shoe gear.  4. Compression anklets dispensed.  5. Return to clinic in 4 weeks.    Son's name is Visual merchandiser.    Felecia Shelling, DPM [facility]  Dr. Felecia Shelling, DPM    [address]                                        [place], [place] [address]                Office [phone]  Fax [phone]

## 2020-01-18 DIAGNOSIS — I10 Essential (primary) hypertension: Secondary | ICD-10-CM | POA: Diagnosis not present

## 2020-01-18 DIAGNOSIS — F331 Major depressive disorder, recurrent, moderate: Secondary | ICD-10-CM | POA: Diagnosis not present

## 2020-01-18 DIAGNOSIS — G629 Polyneuropathy, unspecified: Secondary | ICD-10-CM | POA: Diagnosis not present

## 2020-01-18 DIAGNOSIS — I700 Atherosclerosis of aorta: Secondary | ICD-10-CM | POA: Diagnosis not present

## 2020-01-18 DIAGNOSIS — Z125 Encounter for screening for malignant neoplasm of prostate: Secondary | ICD-10-CM | POA: Diagnosis not present

## 2020-01-18 DIAGNOSIS — E049 Nontoxic goiter, unspecified: Secondary | ICD-10-CM | POA: Diagnosis not present

## 2020-01-18 DIAGNOSIS — Z0000 Encounter for general adult medical examination without abnormal findings: Secondary | ICD-10-CM | POA: Diagnosis not present

## 2020-01-21 ENCOUNTER — Other Ambulatory Visit: Payer: Self-pay

## 2020-01-21 ENCOUNTER — Ambulatory Visit (INDEPENDENT_AMBULATORY_CARE_PROVIDER_SITE_OTHER): Payer: BC Managed Care – PPO | Admitting: Podiatry

## 2020-01-21 ENCOUNTER — Ambulatory Visit (INDEPENDENT_AMBULATORY_CARE_PROVIDER_SITE_OTHER): Payer: BC Managed Care – PPO

## 2020-01-21 ENCOUNTER — Encounter: Payer: Self-pay | Admitting: Podiatry

## 2020-01-21 DIAGNOSIS — Z9889 Other specified postprocedural states: Secondary | ICD-10-CM

## 2020-01-21 DIAGNOSIS — G5763 Lesion of plantar nerve, bilateral lower limbs: Secondary | ICD-10-CM

## 2020-01-22 NOTE — Progress Notes (Signed)
   Subjective:  Patient presents today status post neurectomy bilateral 3rd interspace. DOS: 12/20/2019. He states he has been more active lately which has caused an increase in soreness of the foot. He states the pain has been radiating to his ankle. He has been taking Percocet for treatment with some relief. Patient is here for further evaluation and treatment.  Past Medical History:  Diagnosis Date  . Allergy   . Anxiety   . Depression   . DJD (degenerative joint disease), cervical    postiton with pillow under knees, cant turn neck   . Dysentery, amebic, acute 1981  . GERD (gastroesophageal reflux disease)   . H/O bronchitis   . H/O malaria 1984  . Hearing loss    bilateral   . Hypertension    labile Blood pressure  . Inguinal hernia   . Insomnia    early morning awakening  . MVP (mitral valve prolapse)    "no problems"  . Perianal pain   . Personal history of colonic polyps   . Tinnitus    right ear      Objective/Physical Exam Neurovascular status intact.  Skin incisions appear to be well coapted. No sign of infectious process noted. No dehiscence. No active bleeding noted. Moderate edema noted to the surgical extremity.  Radiographic Exam:  Normal osseous mineralization. Joint spaces preserved. No fracture/dislocation/boney destruction.    Assessment: 1. s/p neurectomy bilateral 3rd interspaces. DOS: 12/20/2019   Plan of Care:  1. Patient was evaluated. X-Rays reviewed.  2. Recommended good shoe gear.  3. Continue taking OTC Advil 800 mg BID.  4. Return to clinic in 4 weeks.     Son's name is Visual merchandiser. Owns a house in [place].    Felecia Shelling, DPM [facility]  Dr. Felecia Shelling, DPM    [address]                                        [place], [place] [address]                Office [phone]  Fax [phone]

## 2020-02-04 ENCOUNTER — Ambulatory Visit (INDEPENDENT_AMBULATORY_CARE_PROVIDER_SITE_OTHER): Payer: BC Managed Care – PPO | Admitting: Podiatry

## 2020-02-04 ENCOUNTER — Other Ambulatory Visit: Payer: Self-pay

## 2020-02-04 ENCOUNTER — Ambulatory Visit (HOSPITAL_COMMUNITY)
Admission: EM | Admit: 2020-02-04 | Discharge: 2020-02-04 | Discharge status: Against Medical Advice | Payer: BC Managed Care – PPO

## 2020-02-04 ENCOUNTER — Encounter: Payer: Self-pay | Admitting: Podiatry

## 2020-02-04 DIAGNOSIS — Z9889 Other specified postprocedural states: Secondary | ICD-10-CM

## 2020-02-04 DIAGNOSIS — G5763 Lesion of plantar nerve, bilateral lower limbs: Secondary | ICD-10-CM

## 2020-02-04 MED ORDER — OXYCODONE-ACETAMINOPHEN 5-325 MG PO TABS: 1.0000 | ORAL_TABLET | Freq: Four times a day (QID) | ORAL | 0 refills | Status: DC | PRN

## 2020-02-04 NOTE — Progress Notes (Signed)
   Subjective:  Patient presents today status post neurectomy bilateral 3rd interspace. DOS: 12/20/2019.  Patient states over the last few days he has been experiencing some increased pain to bilateral feet plantarly.  Patient has been taking OTC Advil as needed pain.  Patient is going to [place] for 3 weeks and wants to know if he is okay to go.  No new complaints at this time  Past Medical History:  Diagnosis Date  . Allergy   . Anxiety   . Depression   . DJD (degenerative joint disease), cervical    postiton with pillow under knees, cant turn neck   . Dysentery, amebic, acute 1981  . GERD (gastroesophageal reflux disease)   . H/O bronchitis   . H/O malaria 1984  . Hearing loss    bilateral   . Hypertension    labile Blood pressure  . Inguinal hernia   . Insomnia    early morning awakening  . MVP (mitral valve prolapse)    "no problems"  . Perianal pain   . Personal history of colonic polyps   . Tinnitus    right ear      Objective/Physical Exam Neurovascular status intact.  Skin incisions appear to be well coapted and have healed.  Negative for any significant edema.  There is some tenderness palpation to the plantar aspect of the second and third metatarsal heads bilateral.  Assessment: 1. s/p neurectomy bilateral 3rd interspaces. DOS: 12/20/2019   Plan of Care:  1. Patient was evaluated.   2.  Patient is okay to go to [place].  Recommend good supportive tennis shoes or clarks shoes that he wore today. 3.  If the patient is going to wear sandals recommend that they are supportive.  Do not recommend going barefoot 4.  Refill prescription for Percocet 5/325 mg #30  5.  Continue OTC Advil as needed  6.  Return to clinic in 6 weeks for final follow-up  Son's name is Visual merchandiser. Owns a house in [place].    Felecia Shelling, DPM [facility]  Dr. Felecia Shelling, DPM    [address]                                        [place], [place] [address]                 Office [phone]  Fax [phone]

## 2020-02-05 DIAGNOSIS — Z658 Other specified problems related to psychosocial circumstances: Secondary | ICD-10-CM | POA: Diagnosis not present

## 2020-02-05 DIAGNOSIS — I10 Essential (primary) hypertension: Secondary | ICD-10-CM | POA: Diagnosis not present

## 2020-02-05 DIAGNOSIS — F419 Anxiety disorder, unspecified: Secondary | ICD-10-CM | POA: Diagnosis not present

## 2020-02-06 DIAGNOSIS — I10 Essential (primary) hypertension: Secondary | ICD-10-CM | POA: Diagnosis not present

## 2020-02-06 DIAGNOSIS — F419 Anxiety disorder, unspecified: Secondary | ICD-10-CM | POA: Diagnosis not present

## 2020-02-27 ENCOUNTER — Encounter: Payer: BC Managed Care – PPO | Admitting: Podiatry

## 2020-03-17 ENCOUNTER — Encounter: Payer: BC Managed Care – PPO | Admitting: Podiatry

## 2020-03-31 ENCOUNTER — Other Ambulatory Visit: Payer: Self-pay

## 2020-03-31 ENCOUNTER — Encounter: Payer: Self-pay | Admitting: Podiatry

## 2020-03-31 ENCOUNTER — Ambulatory Visit (INDEPENDENT_AMBULATORY_CARE_PROVIDER_SITE_OTHER): Payer: BC Managed Care – PPO | Admitting: Podiatry

## 2020-03-31 DIAGNOSIS — Z9889 Other specified postprocedural states: Secondary | ICD-10-CM

## 2020-03-31 DIAGNOSIS — G5763 Lesion of plantar nerve, bilateral lower limbs: Secondary | ICD-10-CM

## 2020-03-31 NOTE — Patient Instructions (Signed)
Superfeet Easyfit men's dress shoe comfort orthotic inserts for heel and arch support

## 2020-03-31 NOTE — Progress Notes (Signed)
   Subjective:  Patient presents today status post neurectomy bilateral 3rd interspace. DOS: 12/20/2019.  Patient states that he continues to have some pain and tenderness to the forefoot bilateral.  He does know that he has been diagnosed in the past with a history of small fiber neuropathy.  He is unsure if he is dealing with neuropathy or pain from the surgical site.  He presents for further treatment and evaluation  Past Medical History:  Diagnosis Date  . Allergy   . Anxiety   . Depression   . DJD (degenerative joint disease), cervical    postiton with pillow under knees, cant turn neck   . Dysentery, amebic, acute 1981  . GERD (gastroesophageal reflux disease)   . H/O bronchitis   . H/O malaria 1984  . Hearing loss    bilateral   . Hypertension    labile Blood pressure  . Inguinal hernia   . Insomnia    early morning awakening  . MVP (mitral valve prolapse)    "no problems"  . Perianal pain   . Personal history of colonic polyps   . Tinnitus    right ear     Objective: Physical Exam General: The patient is alert and oriented x3 in no acute distress.  Dermatology: Skin is cool, dry and supple bilateral lower extremities. Negative for open lesions or macerations.  Incisions to the third interspace bilateral are completely healed.  Vascular: Palpable pedal pulses bilaterally. No edema or erythema noted. Capillary refill within normal limits.  Neurological: Epicritic and protective threshold grossly intact bilaterally.   Musculoskeletal Exam: All pedal and ankle joints range of motion within normal limits bilateral. Muscle strength 5/5 in all groups bilateral.  Good range of motion noted to the second and third and fourth MTPJ's of the bilateral feet.    Assessment: 1. s/p neurectomy bilateral 3rd interspaces. DOS: 12/20/2019 2.  Small fiber neuropathy with paresthesia bilateral feet   Plan of Care:  1. Patient was evaluated.   2.  Recommend good supportive arch  supports to support the arch and take pressure off of the forefoot.  Recommended OTC super feet for dress shoe insoles. 3.  OTC power step insoles provided today for sneakers 4.  Recommend daily range of motion exercises to the MTPJ's 5.  Return to clinic in 1 month  Son's name is Visual merchandiser. Owns a house in [place].  Wife is from [place]   Reia Viernes M. Lovie Agresta, DPM [facility]  Dr. Felecia Shelling, DPM    [address]                                        [place], [place] [address]                Office [phone]  Fax [phone]

## 2020-04-28 ENCOUNTER — Other Ambulatory Visit: Payer: Self-pay

## 2020-04-28 ENCOUNTER — Ambulatory Visit: Payer: BC Managed Care – PPO | Admitting: Podiatry

## 2020-04-28 DIAGNOSIS — G5763 Lesion of plantar nerve, bilateral lower limbs: Secondary | ICD-10-CM | POA: Diagnosis not present

## 2020-04-28 DIAGNOSIS — M79672 Pain in left foot: Secondary | ICD-10-CM

## 2020-04-28 DIAGNOSIS — M79671 Pain in right foot: Secondary | ICD-10-CM

## 2020-04-28 DIAGNOSIS — G8929 Other chronic pain: Secondary | ICD-10-CM

## 2020-04-28 DIAGNOSIS — G609 Hereditary and idiopathic neuropathy, unspecified: Secondary | ICD-10-CM | POA: Diagnosis not present

## 2020-04-28 NOTE — Progress Notes (Signed)
  Subjective:  Patient ID: Brad [name], male    DOB: 05-23-1956,  MRN: [id number]  Chief Complaint  Patient presents with  . Routine Post Op    DOS 12/20/19 NEURECTOMY B/L    64 y.o. male presents with the above complaint. History confirmed with patient.  Still having significant pain, he says it is just as bad as it was before and after surgery.  Objective:  Physical Exam: warm, good capillary refill, no trophic changes or ulcerative lesions and normal DP and PT pulses. Diffuse pain on palpation to the dorsal and plantar forefoot, worse in the first and second interspaces bilaterally   Assessment:   1. Morton's neuroma of both feet   2. Idiopathic small fiber peripheral neuropathy   3. Chronic pain of both feet      Plan:  Patient was evaluated and treated and all questions answered.   -Discussed with him that he likely has chronic pain that is likely a combination of painful idiopathic small fiber peripheral neuropathy which he has been previously diagnosed with by neurologist  -He was unable to tolerate gabapentin use and Lyrica was not helpful.  Has not used topical pain creams before  -I recommended that we try topical therapy with a compounded neuropathic pain cream from [facility].  Prescription was sent for this.  -I discussed with him that I recently learned about the chronic issue and center of [facility], a multidisciplinary chronic pain treatment practice that specializes in refractory neuropathic pain.  Referral to the Center was given to him today.  -Return in 2 to 3 months to follow-up with Dr. Logan Bores  Return in about 3 months (around 07/29/2020) for With Dr. Logan Bores.

## 2020-04-28 NOTE — Patient Instructions (Signed)
[facility] [phone] Neuropathy cream

## 2020-05-06 DIAGNOSIS — G5601 Carpal tunnel syndrome, right upper limb: Secondary | ICD-10-CM | POA: Diagnosis not present

## 2020-05-18 IMAGING — CT CT ABDOMEN AND PELVIS WITH CONTRAST
2 of 5 series · 15 of 46 positions shown, 17 images · IV contrast (APPLIED)
Comparison: None.

CLINICAL DATA: Enlarging painful left inguinal hernia. Scheduled
for inguinal hernia surgery in 2 months. Fecal impaction.

EXAM:
CT ABDOMEN AND PELVIS WITH CONTRAST
TECHNIQUE: Multidetector CT imaging of the abdomen and pelvis was performed
using the standard protocol following bolus administration of
intravenous contrast.
CONTRAST:  100mL OMNIPAQUE IOHEXOL 300 MG/ML  SOLN

[Series 3: abdomen 5.0 · axial · 0.70mm/px · z∈[+656,+986]mm · 12 of 78 slices shown, 14 images]
[im 6/78  soft-tissue]
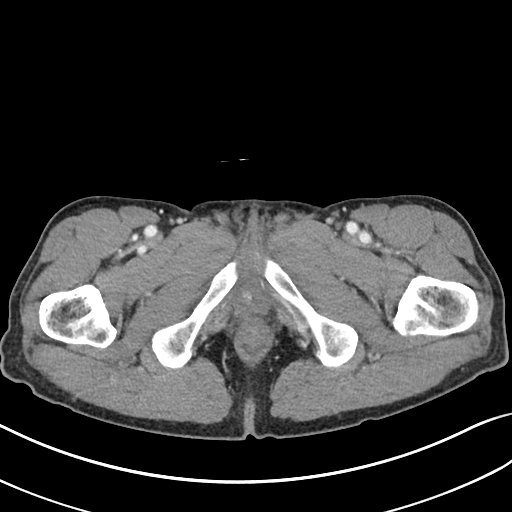
[im 6/78  bone]
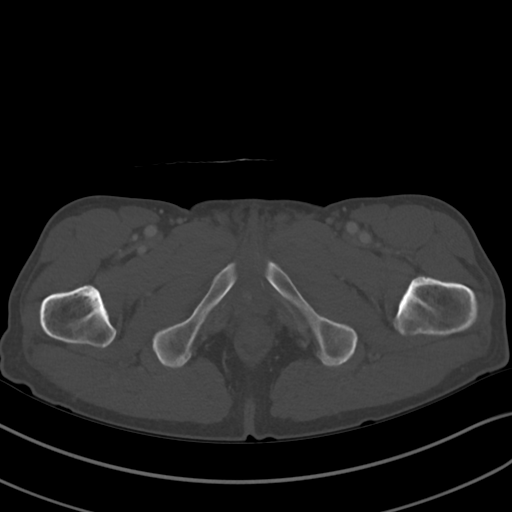
[im 12/78  soft-tissue]
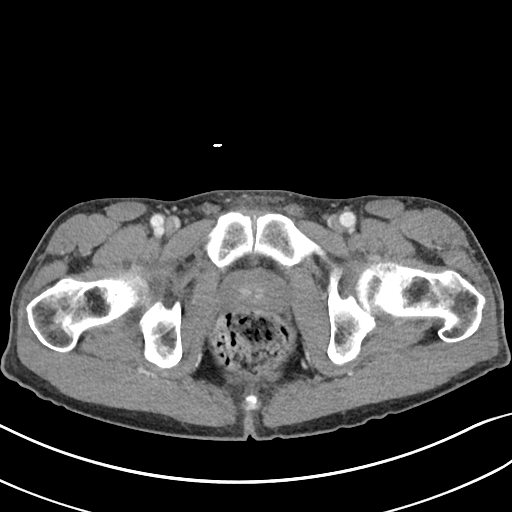
[im 18/78  soft-tissue]
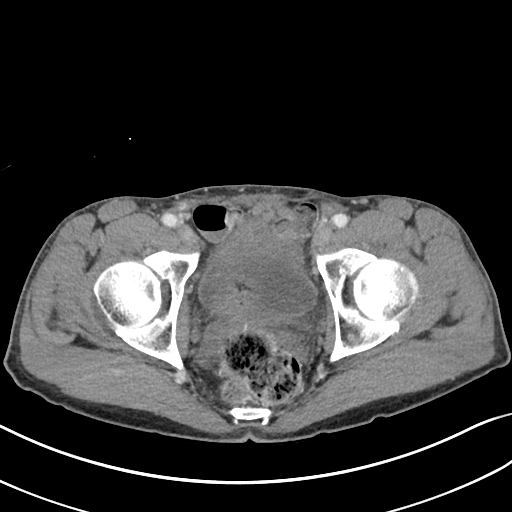
[im 24/78  soft-tissue]
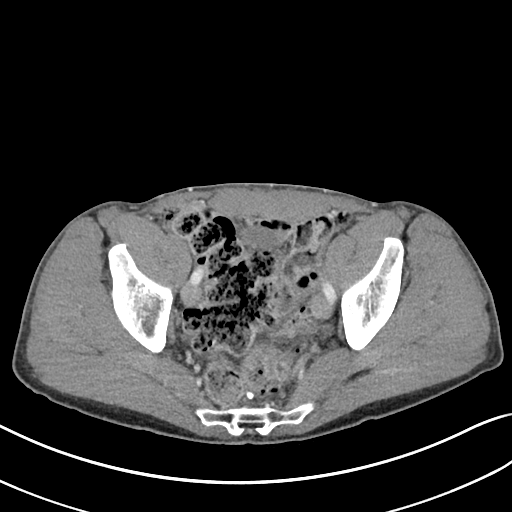
[im 30/78  soft-tissue]
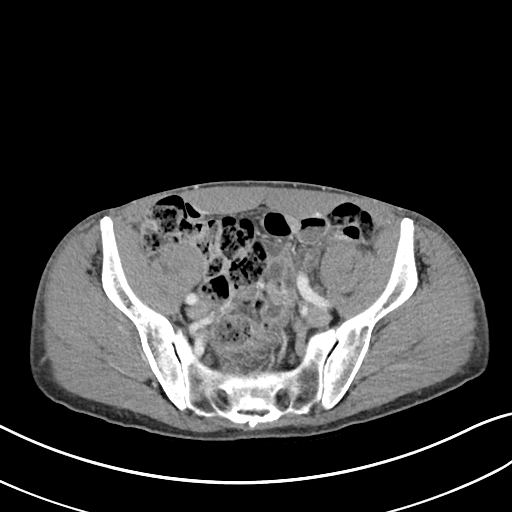
[im 36/78  soft-tissue]
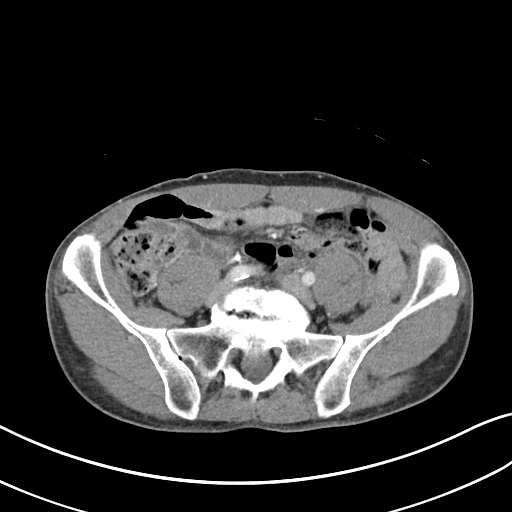
[im 42/78  soft-tissue]
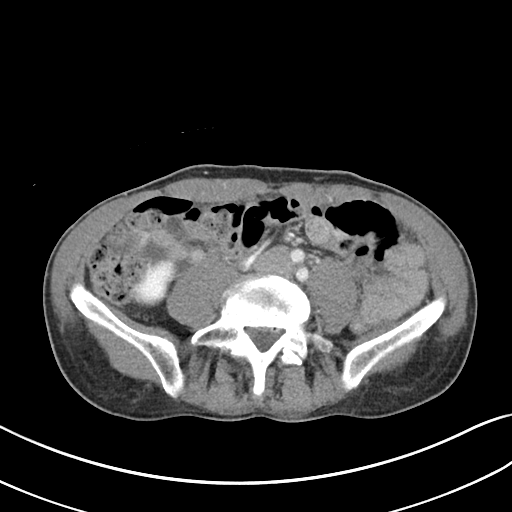
[im 48/78  soft-tissue]
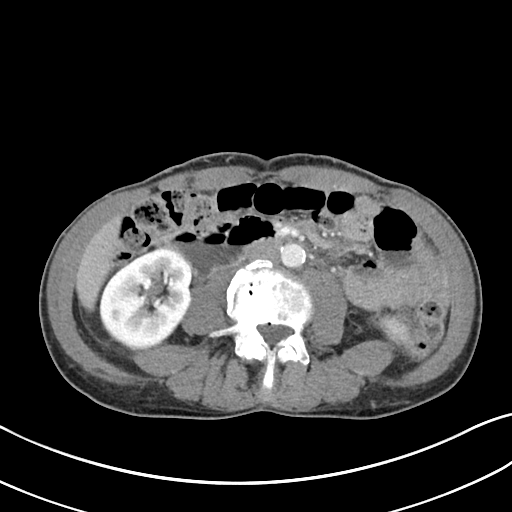
[im 54/78  soft-tissue]
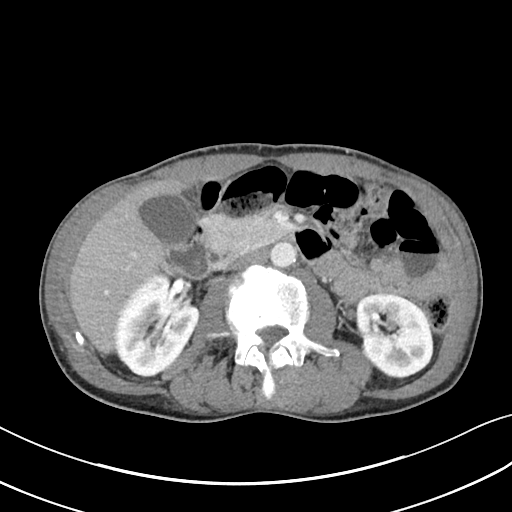
[im 54/78  bone]
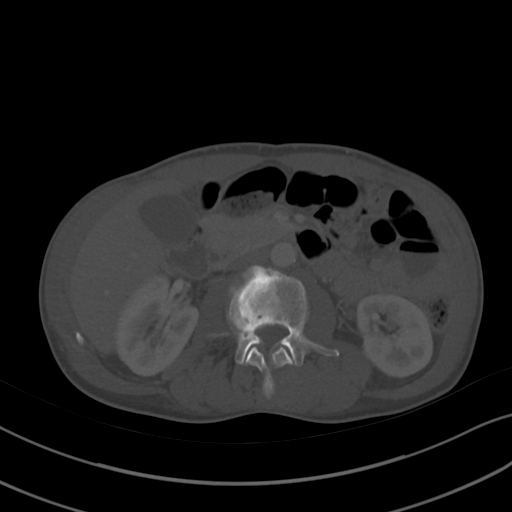
[im 60/78  soft-tissue]
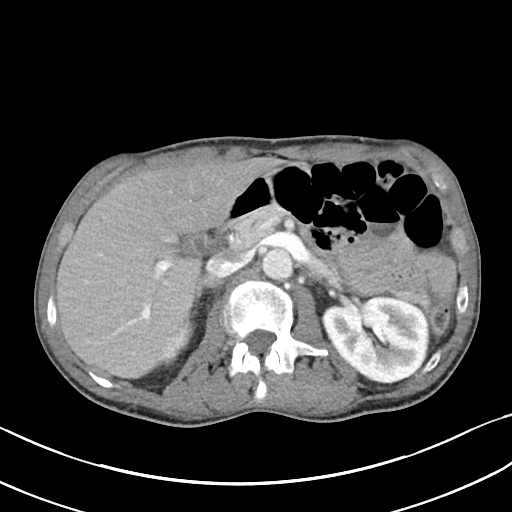
[im 66/78  soft-tissue]
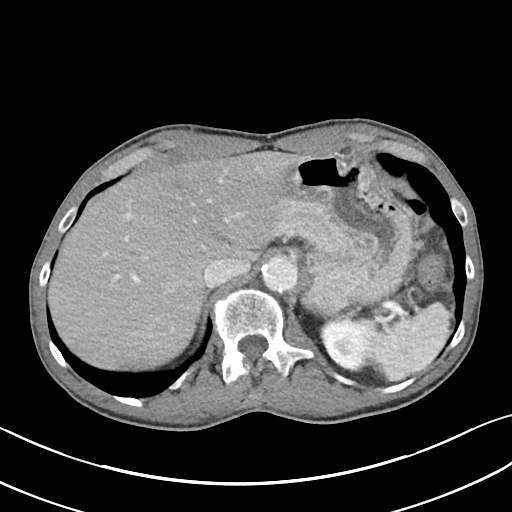
[im 72/78  soft-tissue]
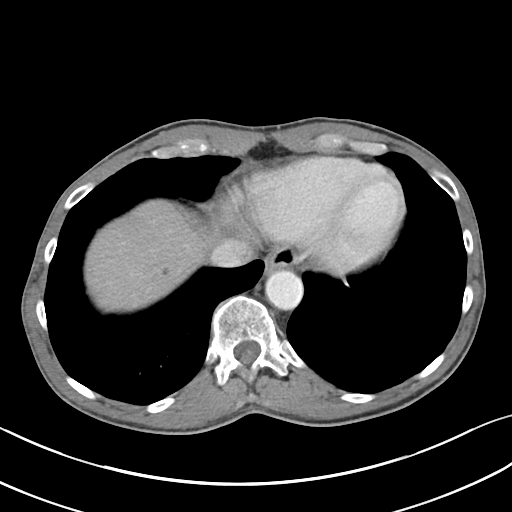

[Series 6: abdomen 3.0 mpr cor · coronal · 0.68mm/px · 3 of 83 slices shown]
[im 37/83  soft-tissue]
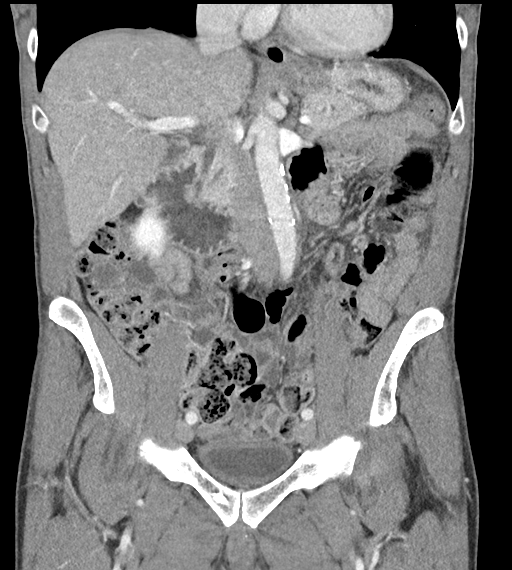
[im 46/83  soft-tissue]
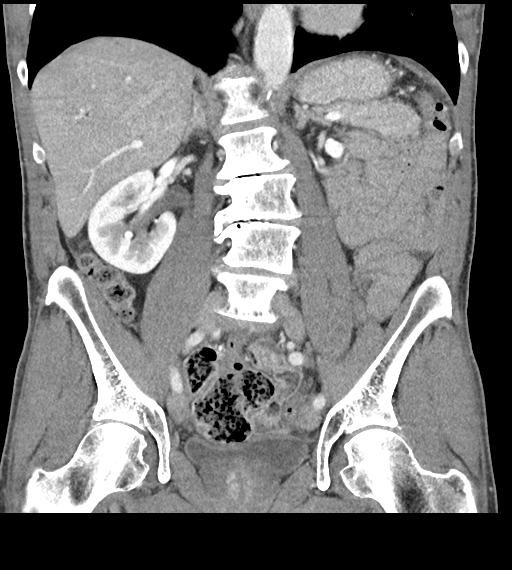
[im 55/83  soft-tissue]
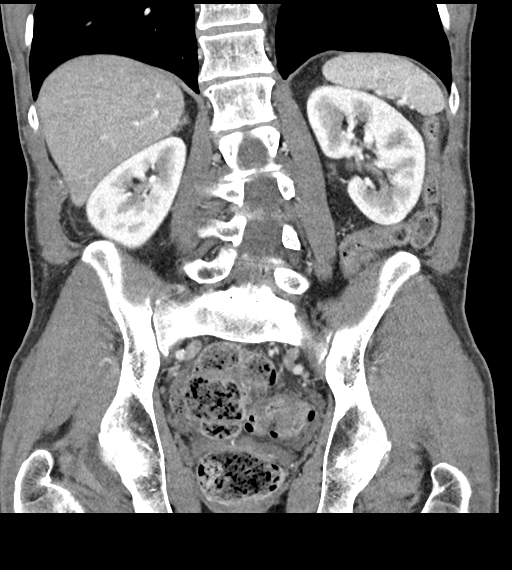

[15 of 46 positions shown; findings below may reference images not displayed]

FINDINGS: Lower chest: Clear lung bases. No significant pleural or pericardial
effusion.

Hepatobiliary: The liver is normal in density without suspicious
focal abnormality. Tiny low-density hepatic lesions likely cysts. No
evidence of gallstones, gallbladder wall thickening or significant
biliary dilatation.

Pancreas: Unremarkable. No pancreatic ductal dilatation or
surrounding inflammatory changes.

Spleen: Normal in size without focal abnormality.

Adrenals/Urinary Tract: Both adrenal glands appear normal. Both
kidneys appear normal. No evidence of urinary tract calculus or
hydronephrosis. The bladder appears unremarkable for its degree of
distention.

Stomach/Bowel: No enteric contrast was administered. No evidence of
bowel wall thickening, distention or surrounding inflammation. The
cecum is located low in the pelvis, and the appendix is not
confidently identified. There is prominent stool in the cecum and
rectum. No herniated bowel.

Vascular/Lymphatic: There are no enlarged abdominal or pelvic lymph
nodes. Mild aortic and branch vessel atherosclerosis. No acute
vascular findings.

Reproductive: The prostate gland and seminal vesicles appear normal.

Other: No evidence of abdominal wall mass or hernia. No ascites.

Musculoskeletal: No acute or significant osseous findings. There is
lower lumbar spondylosis associated with a convex left scoliosis.
IMPRESSION: 1. No acute findings identified. No significant abdominal wall
hernia.
2. Prominent stool in the cecum and rectum without evidence of bowel
obstruction or wall thickening.
3.  Aortic Atherosclerosis ([id number]).

## 2020-05-29 DIAGNOSIS — Z23 Encounter for immunization: Secondary | ICD-10-CM | POA: Diagnosis not present

## 2020-06-18 IMAGING — DX PORTABLE CHEST - 1 VIEW
2 series · 2 of 2 positions shown · non-contrast
Comparison: 10/22/2018

CLINICAL DATA: Seizure

EXAM:
PORTABLE CHEST 1 VIEW

[chest ap (1 of 2)]
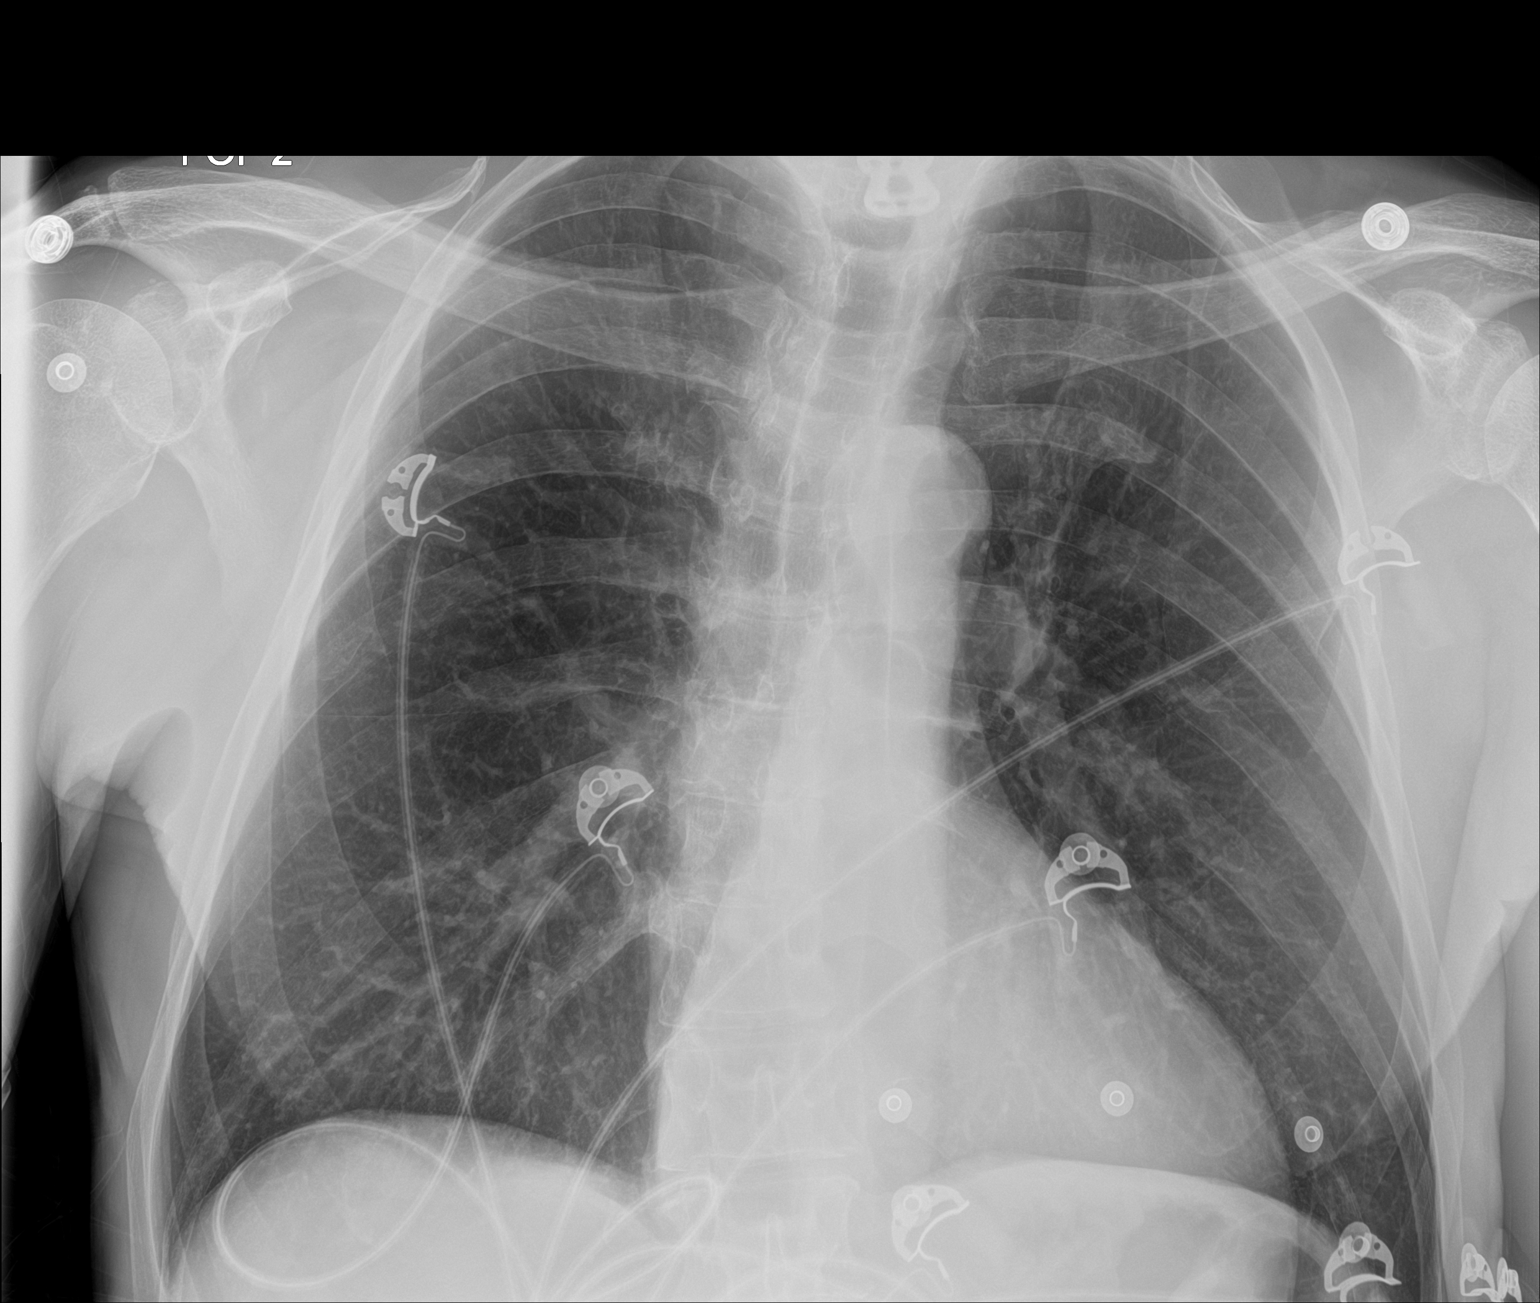

[chest ap (2 of 2)]
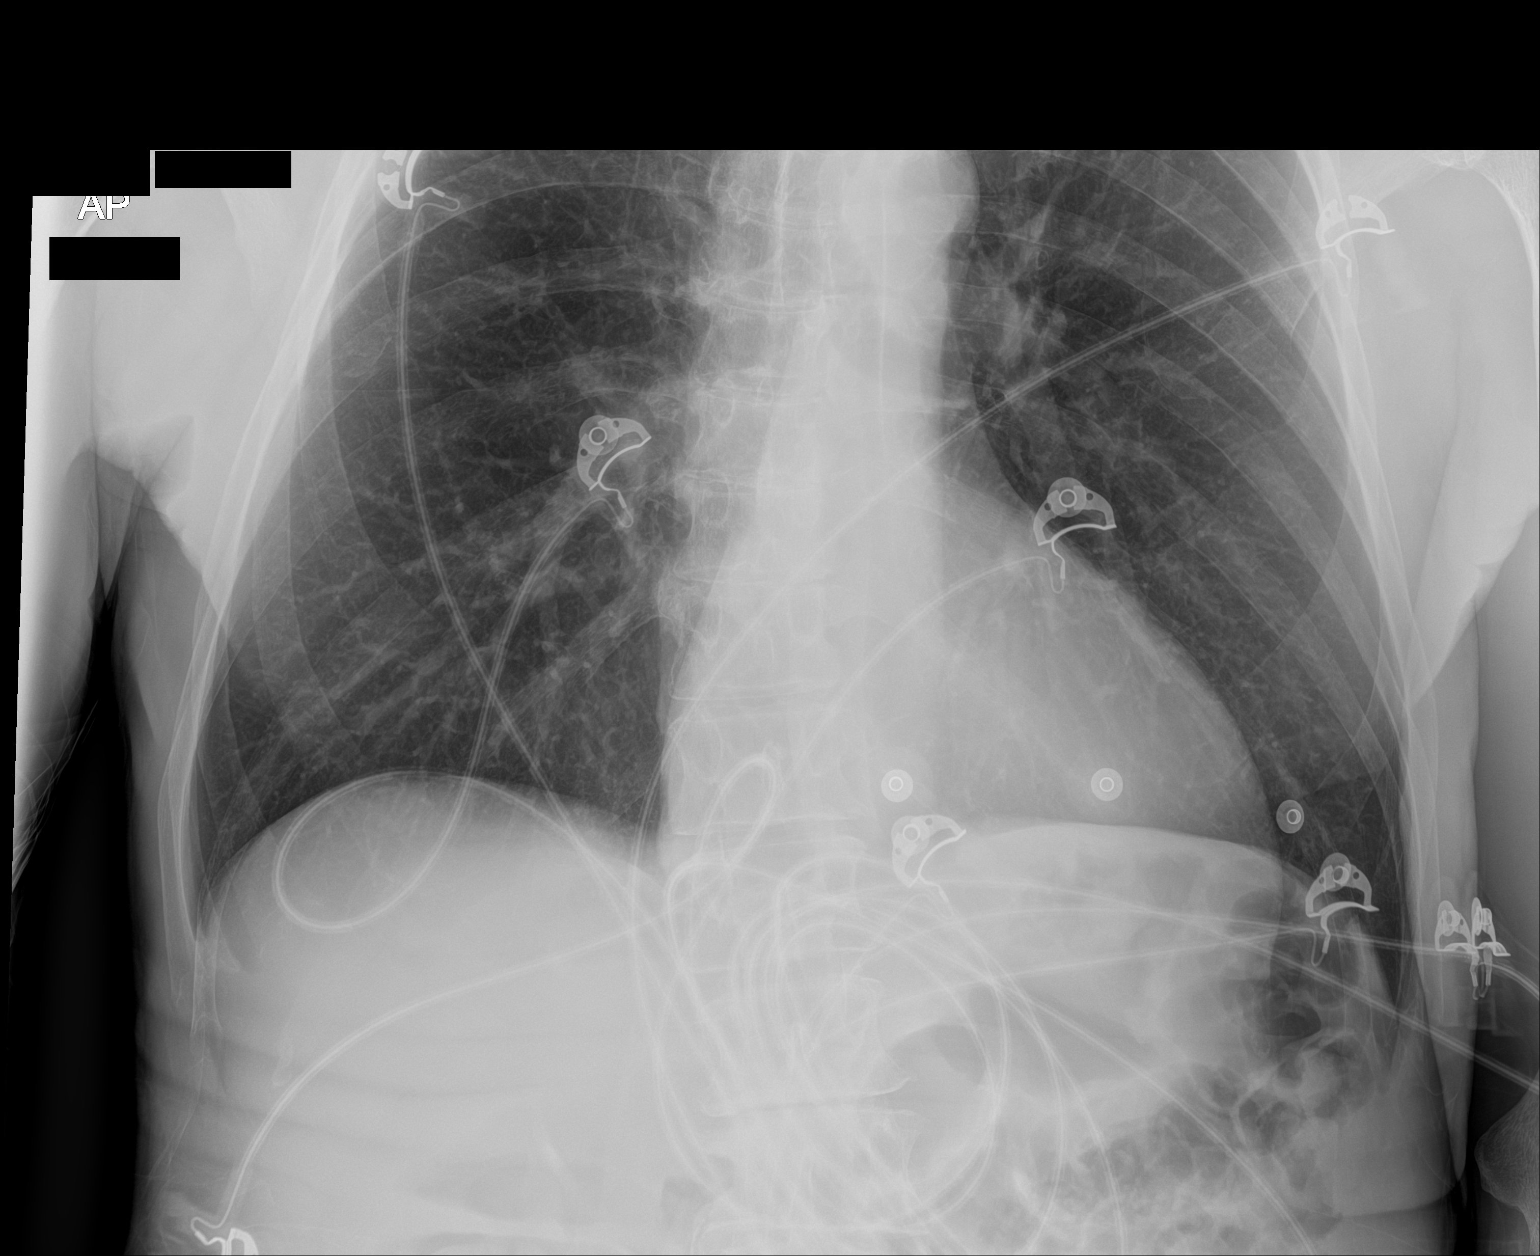

[2 of 2 positions shown; findings below may reference images not displayed]

FINDINGS: Cardiomegaly. Both lungs are clear. The visualized skeletal
structures are unremarkable.
IMPRESSION: Cardiomegaly without acute abnormality of the lungs in AP portable
projection.

## 2020-06-23 DIAGNOSIS — G629 Polyneuropathy, unspecified: Secondary | ICD-10-CM | POA: Diagnosis not present

## 2020-07-02 DIAGNOSIS — F3341 Major depressive disorder, recurrent, in partial remission: Secondary | ICD-10-CM | POA: Diagnosis not present

## 2020-07-08 DIAGNOSIS — H00024 Hordeolum internum left upper eyelid: Secondary | ICD-10-CM | POA: Diagnosis not present

## 2021-09-23 ENCOUNTER — Ambulatory Visit: Payer: Medicare Other | Admitting: Podiatry

## 2021-09-30 ENCOUNTER — Ambulatory Visit (INDEPENDENT_AMBULATORY_CARE_PROVIDER_SITE_OTHER): Payer: Medicare Other

## 2021-09-30 ENCOUNTER — Ambulatory Visit (INDEPENDENT_AMBULATORY_CARE_PROVIDER_SITE_OTHER): Payer: Medicare Other | Admitting: Podiatry

## 2021-09-30 ENCOUNTER — Other Ambulatory Visit: Payer: Self-pay

## 2021-09-30 DIAGNOSIS — G5763 Lesion of plantar nerve, bilateral lower limbs: Secondary | ICD-10-CM | POA: Diagnosis not present

## 2021-09-30 MED ORDER — BETAMETHASONE SOD PHOS & ACET 6 (3-3) MG/ML IJ SUSP
3.0000 mg | Freq: Once | INTRAMUSCULAR | Status: AC
  Administered 2021-09-30: 3 mg via INTRA_ARTICULAR

## 2021-09-30 NOTE — Progress Notes (Signed)
Subjective:  Patient presents today for evaluation of consistent pain and tenderness fluid noted to the bilateral forefoot.  Patient has a history of bilateral Morton's neuroma neurectomy to the second interspace.  DOS: 12/20/2019.  Patient states that he continues to have pain with burning sensation which is isolated to the forefoot extending proximal to the midfoot bilateral.  He does not have any pain or neuropathic sensation to the ankles or heels.  It is strictly isolated to the forefoot and midfoot. Patient states that he has seen multiple physicians and has undergone a series of 6 spinal injections with no improvement.  He also has a neurologist that he sees on a routine basis at [facility].  He takes Lyrica 75 mg nightly and applies the topical anti-inflammatory pain cream compound from [employer] in [place], [place] about 2 times per day.Marland Kitchen  He also wears custom orthotics.  He presents to see if there is any other more recent treatment options than the ones he has already exhausted.  Past Medical History:  Diagnosis Date   Allergy    Anxiety    Depression    DJD (degenerative joint disease), cervical    postiton with pillow under knees, cant turn neck    Dysentery, amebic, acute 1981   GERD (gastroesophageal reflux disease)    H/O bronchitis    H/O malaria 1984   Hearing loss    bilateral    Hypertension    labile Blood pressure   Inguinal hernia    Insomnia    early morning awakening   MVP (mitral valve prolapse)    "no problems"   Perianal pain    Personal history of colonic polyps    Tinnitus    right ear   Past Surgical History:  Procedure Laterality Date   CARPAL TUNNEL RELEASE  10/06, 5/10   right wrist    CARPAL TUNNEL RELEASE  3/10   left wrist    cervical spine discectomy   09/2005   COLONOSCOPY WITH PROPOFOL N/A 10/21/2015   Procedure: COLONOSCOPY WITH PROPOFOL;  Surgeon: Charolett Bumpers, MD;  Location: [facility];  Service: Endoscopy;  Laterality:  N/A;   INGUINAL HERNIA REPAIR  08/16/2011   Procedure: HERNIA REPAIR INGUINAL ADULT;  Surgeon: Valarie Merino, MD;  Location: [facility];  Service: General;  Laterality: Left;   INGUINAL HERNIA REPAIR Right 05/03/2014   Procedure: OPEN RIGHT INGUINAL HERNIA REPAIR WITH MESH;  Surgeon: Wenda Low, MD;  Location: [facility];  Service: General;  Laterality: Right;   INSERTION OF MESH Right 05/03/2014   Procedure: INSERTION OF MESH;  Surgeon: Wenda Low, MD;  Location: [facility];  Service: General;  Laterality: Right;   TONSILLECTOMY  1960   Allergies  Allergen Reactions   Gabapentin Other (See Comments)    Unable to urinate, drowsiness   Trileptal [Oxcarbazepine] Other (See Comments)    Bad taste in his mouth   Objective: Physical Exam General: The patient is alert and oriented x3 in no acute distress.  Dermatology: Skin is cool, dry and supple bilateral lower extremities. Negative for open lesions or macerations.  Incisions to the third interspace bilateral are completely healed.  Vascular: Palpable pedal pulses bilaterally. No edema or erythema noted. Capillary refill within normal limits.  Neurological: Epicritic and protective threshold grossly intact bilaterally.   Musculoskeletal Exam: All pedal and ankle joints range of motion within normal limits bilateral. Muscle strength 5/5 in all groups bilateral.  Good range of motion noted  to the second and third and fourth MTPJ's of the bilateral feet.  Today I palpated the intermetatarsal space between the second and third metatarsal heads and this did not elicit any significant pain or tenderness however he says this is the focal "hot spot"of his pain.  Assessment: 1. s/p neurectomy bilateral 3rd interspaces. DOS: 12/20/2019 2.  Small fiber neuropathy with paresthesia bilateral feet   Plan of Care:  1. Patient was evaluated.   2.  Continue custom molded orthotics 3.  The patient would like to try injection today.  I do not see  any contraindication to an injection to the second intermetatarsal space.  Injection of 0.5 cc Celestone Soluspan injected into the second intermetatarsal space bilateral 4.  Continue Lyrica 75 mg nightly. 5.  Continue topical neuropathic pain cream from [employer] in [place], [place] 6.  Unfortunately I feel that the patient has exhausted multiple modalities to help alleviate pain in his forefoot.  Other than the injections today I am unable to offer any additional medication or therapeutic option that he has not already tried 7.  Return to clinic as needed  Felecia Shelling, DPM [facility]  Dr. Felecia Shelling, DPM    [address]                                        [place], [place] [address]                Office [phone]  Fax [phone]

## 2022-03-08 DIAGNOSIS — M545 Low back pain, unspecified: Secondary | ICD-10-CM | POA: Insufficient documentation

## 2022-06-02 ENCOUNTER — Emergency Department (HOSPITAL_COMMUNITY): Payer: Medicare Other

## 2022-06-02 ENCOUNTER — Emergency Department (HOSPITAL_COMMUNITY)
Admission: EM | Admit: 2022-06-02 | Discharge: 2022-06-03 | Discharge status: Against Medical Advice | Payer: Medicare Other | Attending: Physician Assistant | Admitting: Physician Assistant

## 2022-06-02 DIAGNOSIS — R0602 Shortness of breath: Secondary | ICD-10-CM | POA: Diagnosis not present

## 2022-06-02 DIAGNOSIS — R200 Anesthesia of skin: Secondary | ICD-10-CM | POA: Diagnosis present

## 2022-06-02 DIAGNOSIS — Z5321 Procedure and treatment not carried out due to patient leaving prior to being seen by health care provider: Secondary | ICD-10-CM | POA: Insufficient documentation

## 2022-06-02 DIAGNOSIS — R0789 Other chest pain: Secondary | ICD-10-CM | POA: Diagnosis not present

## 2022-06-02 DIAGNOSIS — R519 Headache, unspecified: Secondary | ICD-10-CM | POA: Insufficient documentation

## 2022-06-02 LAB — CBG MONITORING, ED: Glucose-Capillary: 101 mg/dL — ABNORMAL HIGH (ref 70–99)

## 2022-06-02 LAB — BASIC METABOLIC PANEL
Anion gap: 15 (ref 5–15)
BUN: 12 mg/dL (ref 8–23)
CO2: 18 mmol/L — ABNORMAL LOW (ref 22–32)
Calcium: 9.7 mg/dL (ref 8.9–10.3)
Chloride: 97 mmol/L — ABNORMAL LOW (ref 98–111)
Creatinine, Ser: 0.9 mg/dL (ref 0.61–1.24)
GFR, Estimated: >60 mL/min (ref >60)
Glucose, Bld: 100 mg/dL — ABNORMAL HIGH (ref 70–99)
Potassium: 3.6 mmol/L (ref 3.5–5.1)
Sodium: 130 mmol/L — ABNORMAL LOW (ref 135–145)

## 2022-06-02 LAB — RAPID URINE DRUG SCREEN, HOSP PERFORMED
Amphetamines: NOT DETECTED
Barbiturates: NOT DETECTED
Benzodiazepines: NOT DETECTED
Cocaine: NOT DETECTED
Opiates: NOT DETECTED
Tetrahydrocannabinol: POSITIVE — AB

## 2022-06-02 LAB — CBC WITH DIFFERENTIAL/PLATELET
Abs Immature Granulocytes: 0.04 10*3/uL (ref 0.00–0.07)
Basophils Absolute: 0.1 10*3/uL (ref 0.0–0.1)
Basophils Relative: 1 %
Eosinophils Absolute: 0.1 10*3/uL (ref 0.0–0.5)
Eosinophils Relative: 1 %
HCT: 38.4 % — ABNORMAL LOW (ref 39.0–52.0)
Hemoglobin: 13.8 g/dL (ref 13.0–17.0)
Immature Granulocytes: 0 %
Lymphocytes Relative: 19 %
Lymphs Abs: 1.9 10*3/uL (ref 0.7–4.0)
MCH: 31.9 pg (ref 26.0–34.0)
MCHC: 35.9 g/dL (ref 30.0–36.0)
MCV: 88.7 fL (ref 80.0–100.0)
Monocytes Absolute: 0.9 10*3/uL (ref 0.1–1.0)
Monocytes Relative: 9 %
Neutro Abs: 7 10*3/uL (ref 1.7–7.7)
Neutrophils Relative %: 70 %
Platelets: 262 10*3/uL (ref 150–400)
RBC: 4.33 MIL/uL (ref 4.22–5.81)
RDW: 12.6 % (ref 11.5–15.5)
WBC: 10.1 10*3/uL (ref 4.0–10.5)
nRBC: 0 % (ref 0.0–0.2)

## 2022-06-02 LAB — ETHANOL: Alcohol, Ethyl (B): <10 mg/dL (ref <10)

## 2022-06-02 LAB — MAGNESIUM: Magnesium: 2 mg/dL (ref 1.7–2.4)

## 2022-06-02 LAB — URINALYSIS, ROUTINE W REFLEX MICROSCOPIC
Bilirubin Urine: NEGATIVE
Glucose, UA: NEGATIVE mg/dL
Hgb urine dipstick: NEGATIVE
Ketones, ur: NEGATIVE mg/dL
Leukocytes,Ua: NEGATIVE
Nitrite: NEGATIVE
Protein, ur: NEGATIVE mg/dL
Specific Gravity, Urine: 1.012 (ref 1.005–1.030)
pH: 6 (ref 5.0–8.0)

## 2022-06-02 LAB — ACETAMINOPHEN LEVEL: Acetaminophen (Tylenol), Serum: <10 ug/mL — ABNORMAL LOW (ref 10–30)

## 2022-06-02 LAB — TROPONIN I (HIGH SENSITIVITY): Troponin I (High Sensitivity): 8 ng/L (ref <18)

## 2022-06-02 LAB — SALICYLATE LEVEL: Salicylate Lvl: <7 mg/dL — ABNORMAL LOW (ref 7.0–30.0)

## 2022-06-02 NOTE — ED Triage Notes (Signed)
Patient arrived with EMS from home reports acute anxiety attack this evening with agitation /nervousness , headache , legs numbness after smoking marijuana and drinking ETOH . Alert and oriented /respirations unlabored .

## 2022-06-02 NOTE — ED Provider Triage Note (Signed)
Emergency Medicine Provider Triage Evaluation Note  Brad Brad [name] , Brad [name] 66 y.o. male  was evaluated in triage.  Pt complains of multiple complaints.  States he was fine, apparently smokes marijuana, drinks alcohol 1 hour PTA felt jittery, numbness all over, headache, chest tightness.  States have history of anxiety.  He felt he was having Brad [name] heart attack.  No back pain.  No recent falls or injuries.  No weakness.  Did state that he recently got back from being out of the country. Neg stroke screen with EMS  Review of Systems  Positive: CP, SOB, numbness, HA Negative: Fever, weakness, slurred speech  Physical Exam  There were no vitals taken for this visit. Gen:   Awake, no distress, appear anxious Resp:  Normal effort  MSK:   Moves extremities without difficulty  Neuro:  CN 2-12 grossly intact, equal grip, intact sensation, ambulatory Other:    Medical Decision Making  Medically screening exam initiated at 7:50 PM.  Appropriate orders placed.  Brad Brad [name] was informed that the remainder of the evaluation will be completed by another provider, this initial triage assessment does not replace that evaluation, and the importance of remaining in the ED until their evaluation is complete.  Multiple complaints   Brad Boswell A, PA-C 06/02/22 1952

## 2022-06-03 NOTE — ED Notes (Signed)
[facility] 

## 2022-06-22 ENCOUNTER — Other Ambulatory Visit: Payer: Self-pay | Admitting: Internal Medicine

## 2022-06-22 ENCOUNTER — Ambulatory Visit
Admission: RE | Admit: 2022-06-22 | Discharge: 2022-06-22 | Disposition: A | Discharge status: Home / Self Care | Payer: Medicare Other | Source: Ambulatory Visit | Attending: Internal Medicine | Admitting: Internal Medicine

## 2022-06-22 DIAGNOSIS — R1030 Lower abdominal pain, unspecified: Secondary | ICD-10-CM

## 2022-06-28 ENCOUNTER — Other Ambulatory Visit: Payer: Self-pay | Admitting: Physician Assistant

## 2022-06-28 DIAGNOSIS — R10814 Left lower quadrant abdominal tenderness: Secondary | ICD-10-CM

## 2022-06-29 ENCOUNTER — Inpatient Hospital Stay: Admission: RE | Admit: 2022-06-29 | Payer: Medicare Other | Source: Ambulatory Visit

## 2022-06-30 ENCOUNTER — Ambulatory Visit
Admission: RE | Admit: 2022-06-30 | Discharge: 2022-06-30 | Disposition: A | Discharge status: Home / Self Care | Payer: Medicare Other | Source: Ambulatory Visit | Attending: Physician Assistant | Admitting: Physician Assistant

## 2022-06-30 DIAGNOSIS — R10814 Left lower quadrant abdominal tenderness: Secondary | ICD-10-CM

## 2022-06-30 MED ORDER — IOPAMIDOL (ISOVUE-300) INJECTION 61%
100.0000 mL | Freq: Once | INTRAVENOUS | Status: AC | PRN
  Administered 2022-06-30: 100 mL via INTRAVENOUS

## 2022-07-13 ENCOUNTER — Emergency Department (HOSPITAL_COMMUNITY)
Admission: EM | Admit: 2022-07-13 | Discharge: 2022-07-13 | Discharge status: Against Medical Advice | Payer: Medicare Other | Attending: Emergency Medicine | Admitting: Emergency Medicine

## 2022-07-13 ENCOUNTER — Emergency Department (HOSPITAL_COMMUNITY): Payer: Medicare Other

## 2022-07-13 ENCOUNTER — Encounter (HOSPITAL_COMMUNITY): Payer: Self-pay

## 2022-07-13 DIAGNOSIS — Z5321 Procedure and treatment not carried out due to patient leaving prior to being seen by health care provider: Secondary | ICD-10-CM | POA: Insufficient documentation

## 2022-07-13 DIAGNOSIS — R1031 Right lower quadrant pain: Secondary | ICD-10-CM | POA: Diagnosis not present

## 2022-07-13 DIAGNOSIS — I10 Essential (primary) hypertension: Secondary | ICD-10-CM | POA: Insufficient documentation

## 2022-07-13 LAB — URINALYSIS, ROUTINE W REFLEX MICROSCOPIC
Bilirubin Urine: NEGATIVE
Glucose, UA: NEGATIVE mg/dL
Hgb urine dipstick: NEGATIVE
Ketones, ur: 5 mg/dL — AB
Leukocytes,Ua: NEGATIVE
Nitrite: NEGATIVE
Protein, ur: NEGATIVE mg/dL
Specific Gravity, Urine: 1.006 (ref 1.005–1.030)
pH: 8 (ref 5.0–8.0)

## 2022-07-13 LAB — CBC WITH DIFFERENTIAL/PLATELET
Abs Immature Granulocytes: 0.02 10*3/uL (ref 0.00–0.07)
Basophils Absolute: 0.1 10*3/uL (ref 0.0–0.1)
Basophils Relative: 1 %
Eosinophils Absolute: 0 10*3/uL (ref 0.0–0.5)
Eosinophils Relative: 0 %
HCT: 41.5 % (ref 39.0–52.0)
Hemoglobin: 14.7 g/dL (ref 13.0–17.0)
Immature Granulocytes: 0 %
Lymphocytes Relative: 31 %
Lymphs Abs: 2.1 10*3/uL (ref 0.7–4.0)
MCH: 31.5 pg (ref 26.0–34.0)
MCHC: 35.4 g/dL (ref 30.0–36.0)
MCV: 89.1 fL (ref 80.0–100.0)
Monocytes Absolute: 0.5 10*3/uL (ref 0.1–1.0)
Monocytes Relative: 7 %
Neutro Abs: 4.2 10*3/uL (ref 1.7–7.7)
Neutrophils Relative %: 61 %
Platelets: 213 10*3/uL (ref 150–400)
RBC: 4.66 MIL/uL (ref 4.22–5.81)
RDW: 12.9 % (ref 11.5–15.5)
WBC: 6.9 10*3/uL (ref 4.0–10.5)
nRBC: 0 % (ref 0.0–0.2)

## 2022-07-13 LAB — COMPREHENSIVE METABOLIC PANEL
ALT: 23 U/L (ref 0–44)
AST: 31 U/L (ref 15–41)
Albumin: 4.7 g/dL (ref 3.5–5.0)
Alkaline Phosphatase: 64 U/L (ref 38–126)
Anion gap: 9 (ref 5–15)
BUN: 15 mg/dL (ref 8–23)
CO2: 26 mmol/L (ref 22–32)
Calcium: 9.8 mg/dL (ref 8.9–10.3)
Chloride: 99 mmol/L (ref 98–111)
Creatinine, Ser: 0.83 mg/dL (ref 0.61–1.24)
GFR, Estimated: >60 mL/min (ref >60)
Glucose, Bld: 109 mg/dL — ABNORMAL HIGH (ref 70–99)
Potassium: 3.7 mmol/L (ref 3.5–5.1)
Sodium: 134 mmol/L — ABNORMAL LOW (ref 135–145)
Total Bilirubin: 1.1 mg/dL (ref 0.3–1.2)
Total Protein: 7.3 g/dL (ref 6.5–8.1)

## 2022-07-13 LAB — LIPASE, BLOOD: Lipase: 43 U/L (ref 11–51)

## 2022-07-13 MED ORDER — OXYCODONE-ACETAMINOPHEN 5-325 MG PO TABS
2.0000 | ORAL_TABLET | Freq: Once | ORAL | Status: AC
  Administered 2022-07-13: 2 via ORAL
  Filled 2022-07-13: qty 2

## 2022-07-13 NOTE — ED Triage Notes (Signed)
Pt arrived via POV, c/o RUQ abd pain and constipation. Denies any vomiting.
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
## 2022-08-04 ENCOUNTER — Other Ambulatory Visit: Payer: Self-pay

## 2022-08-04 ENCOUNTER — Emergency Department (HOSPITAL_COMMUNITY)
Admission: EM | Admit: 2022-08-04 | Discharge: 2022-08-05 | Discharge status: Against Medical Advice | Payer: Medicare Other | Attending: Emergency Medicine | Admitting: Emergency Medicine

## 2022-08-04 ENCOUNTER — Encounter (HOSPITAL_COMMUNITY): Payer: Self-pay

## 2022-08-04 ENCOUNTER — Emergency Department (HOSPITAL_COMMUNITY): Payer: Medicare Other

## 2022-08-04 DIAGNOSIS — Z5321 Procedure and treatment not carried out due to patient leaving prior to being seen by health care provider: Secondary | ICD-10-CM | POA: Diagnosis not present

## 2022-08-04 DIAGNOSIS — R0602 Shortness of breath: Secondary | ICD-10-CM | POA: Insufficient documentation

## 2022-08-04 LAB — BASIC METABOLIC PANEL
Anion gap: 13 (ref 5–15)
BUN: 16 mg/dL (ref 8–23)
CO2: 20 mmol/L — ABNORMAL LOW (ref 22–32)
Calcium: 9.9 mg/dL (ref 8.9–10.3)
Chloride: 99 mmol/L (ref 98–111)
Creatinine, Ser: 0.94 mg/dL (ref 0.61–1.24)
GFR, Estimated: >60 mL/min (ref >60)
Glucose, Bld: 110 mg/dL — ABNORMAL HIGH (ref 70–99)
Potassium: 3.9 mmol/L (ref 3.5–5.1)
Sodium: 132 mmol/L — ABNORMAL LOW (ref 135–145)

## 2022-08-04 LAB — CBC WITH DIFFERENTIAL/PLATELET
Abs Immature Granulocytes: 0.04 10*3/uL (ref 0.00–0.07)
Basophils Absolute: 0 10*3/uL (ref 0.0–0.1)
Basophils Relative: 0 %
Eosinophils Absolute: 0 10*3/uL (ref 0.0–0.5)
Eosinophils Relative: 0 %
HCT: 41.3 % (ref 39.0–52.0)
Hemoglobin: 14.7 g/dL (ref 13.0–17.0)
Immature Granulocytes: 0 %
Lymphocytes Relative: 12 %
Lymphs Abs: 1.3 10*3/uL (ref 0.7–4.0)
MCH: 31.6 pg (ref 26.0–34.0)
MCHC: 35.6 g/dL (ref 30.0–36.0)
MCV: 88.8 fL (ref 80.0–100.0)
Monocytes Absolute: 0.8 10*3/uL (ref 0.1–1.0)
Monocytes Relative: 7 %
Neutro Abs: 8.6 10*3/uL — ABNORMAL HIGH (ref 1.7–7.7)
Neutrophils Relative %: 81 %
Platelets: 246 10*3/uL (ref 150–400)
RBC: 4.65 MIL/uL (ref 4.22–5.81)
RDW: 13.2 % (ref 11.5–15.5)
WBC: 10.7 10*3/uL — ABNORMAL HIGH (ref 4.0–10.5)
nRBC: 0 % (ref 0.0–0.2)

## 2022-08-04 LAB — D-DIMER, QUANTITATIVE: D-Dimer, Quant: 0.29 ug/mL-FEU (ref 0.00–0.50)

## 2022-08-04 NOTE — ED Triage Notes (Signed)
PER EMS: pt is from home with c/o SOB x 3 days progressively worsening today associated with tightness to left side of chest with inspiration. He also reports constipation, LBM 2 weeks ago. He recently went to [place] and returned this past Sunday.   BP- 144/86, HR-72, O2-100% RA, CBG-102

## 2022-08-04 NOTE — ED Provider Triage Note (Signed)
Emergency Medicine Provider Triage Evaluation Note  Brad [name] , a 66 y.o. male  was evaluated in triage.  Pt complains of sob. SOB on L side of chest for 3 days.  Recently return from [place].  Also report constipation for several weeks.  No hx of PE/DVT.  No fever, productive cough or hemoptysis  Review of Systems  Positive: As above Negative: As above  Physical Exam  BP 133/89 (BP Location: Right Arm)   Pulse 63   Temp 98.4 F (36.9 C) (Oral)   Resp 17   Ht 5\' 10"  (1.778 m)   Wt 58.5 kg   SpO2 98%   BMI 18.51 kg/m  Gen:   Awake, no distress   Resp:  Normal effort  MSK:   Moves extremities without difficulty  Other:    Medical Decision Making  Medically screening exam initiated at 7:45 PM.  Appropriate orders placed.  Brad [name] was informed that the remainder of the evaluation will be completed by another provider, this initial triage assessment does not replace that evaluation, and the importance of remaining in the ED until their evaluation is complete.     , PA-C 08/04/22 1951

## 2022-08-05 NOTE — ED Notes (Signed)
Patient left on own accord °

## 2022-08-09 ENCOUNTER — Ambulatory Visit
Admission: RE | Admit: 2022-08-09 | Discharge: 2022-08-09 | Disposition: A | Discharge status: Home / Self Care | Payer: Medicare Other | Source: Ambulatory Visit | Attending: Gastroenterology | Admitting: Gastroenterology

## 2022-08-09 ENCOUNTER — Other Ambulatory Visit: Payer: Self-pay | Admitting: Gastroenterology

## 2022-08-09 DIAGNOSIS — R1084 Generalized abdominal pain: Secondary | ICD-10-CM

## 2022-08-19 ENCOUNTER — Inpatient Hospital Stay (HOSPITAL_COMMUNITY)
Admission: EM | Admit: 2022-08-19 | Discharge: 2022-08-23 | DRG: 352 | Disposition: A | Discharge status: Home / Self Care | Payer: Medicare Other | Attending: Surgery | Admitting: Surgery

## 2022-08-19 ENCOUNTER — Encounter (HOSPITAL_COMMUNITY): Payer: Self-pay

## 2022-08-19 ENCOUNTER — Other Ambulatory Visit: Payer: Self-pay

## 2022-08-19 DIAGNOSIS — F32A Depression, unspecified: Secondary | ICD-10-CM | POA: Diagnosis present

## 2022-08-19 DIAGNOSIS — Z888 Allergy status to other drugs, medicaments and biological substances status: Secondary | ICD-10-CM

## 2022-08-19 DIAGNOSIS — K4030 Unilateral inguinal hernia, with obstruction, without gangrene, not specified as recurrent: Secondary | ICD-10-CM | POA: Diagnosis present

## 2022-08-19 DIAGNOSIS — F411 Generalized anxiety disorder: Secondary | ICD-10-CM | POA: Diagnosis present

## 2022-08-19 DIAGNOSIS — R1032 Left lower quadrant pain: Principal | ICD-10-CM

## 2022-08-19 DIAGNOSIS — G8929 Other chronic pain: Secondary | ICD-10-CM | POA: Diagnosis present

## 2022-08-19 DIAGNOSIS — Z86010 Personal history of colonic polyps: Secondary | ICD-10-CM

## 2022-08-19 DIAGNOSIS — R634 Abnormal weight loss: Secondary | ICD-10-CM | POA: Diagnosis present

## 2022-08-19 DIAGNOSIS — I10 Essential (primary) hypertension: Secondary | ICD-10-CM | POA: Diagnosis present

## 2022-08-19 DIAGNOSIS — K4091 Unilateral inguinal hernia, without obstruction or gangrene, recurrent: Secondary | ICD-10-CM | POA: Diagnosis not present

## 2022-08-19 DIAGNOSIS — Z6820 Body mass index (BMI) 20.0-20.9, adult: Secondary | ICD-10-CM

## 2022-08-19 DIAGNOSIS — Z803 Family history of malignant neoplasm of breast: Secondary | ICD-10-CM

## 2022-08-19 DIAGNOSIS — K5909 Other constipation: Secondary | ICD-10-CM | POA: Diagnosis present

## 2022-08-19 DIAGNOSIS — R202 Paresthesia of skin: Secondary | ICD-10-CM | POA: Diagnosis present

## 2022-08-19 DIAGNOSIS — F329 Major depressive disorder, single episode, unspecified: Secondary | ICD-10-CM | POA: Diagnosis present

## 2022-08-19 DIAGNOSIS — K219 Gastro-esophageal reflux disease without esophagitis: Secondary | ICD-10-CM | POA: Diagnosis present

## 2022-08-19 DIAGNOSIS — M1990 Unspecified osteoarthritis, unspecified site: Secondary | ICD-10-CM | POA: Diagnosis present

## 2022-08-19 NOTE — ED Triage Notes (Signed)
Arrives EMS from home with c/o inguinal hernia causing severe pain in testicles.   Seen PCP yesterday and awaiting referral for surgery. Pain increased today and unable to tolerate.

## 2022-08-20 ENCOUNTER — Observation Stay (HOSPITAL_BASED_OUTPATIENT_CLINIC_OR_DEPARTMENT_OTHER): Payer: Medicare Other | Admitting: Anesthesiology

## 2022-08-20 ENCOUNTER — Encounter (HOSPITAL_COMMUNITY): Payer: Self-pay

## 2022-08-20 ENCOUNTER — Other Ambulatory Visit: Payer: Self-pay

## 2022-08-20 ENCOUNTER — Encounter (HOSPITAL_COMMUNITY): Admission: EM | Disposition: A | Discharge status: Home / Self Care | Payer: Self-pay | Source: Home / Self Care

## 2022-08-20 ENCOUNTER — Emergency Department (HOSPITAL_COMMUNITY): Payer: Medicare Other

## 2022-08-20 ENCOUNTER — Observation Stay (HOSPITAL_COMMUNITY): Payer: Medicare Other | Admitting: Anesthesiology

## 2022-08-20 DIAGNOSIS — K4030 Unilateral inguinal hernia, with obstruction, without gangrene, not specified as recurrent: Secondary | ICD-10-CM | POA: Diagnosis present

## 2022-08-20 DIAGNOSIS — K4091 Unilateral inguinal hernia, without obstruction or gangrene, recurrent: Secondary | ICD-10-CM

## 2022-08-20 HISTORY — PX: INGUINAL HERNIA REPAIR: SHX194

## 2022-08-20 LAB — URINALYSIS, ROUTINE W REFLEX MICROSCOPIC
Bilirubin Urine: NEGATIVE
Glucose, UA: NEGATIVE mg/dL
Hgb urine dipstick: NEGATIVE
Ketones, ur: NEGATIVE mg/dL
Leukocytes,Ua: NEGATIVE
Nitrite: NEGATIVE
Protein, ur: NEGATIVE mg/dL
Specific Gravity, Urine: 1.014 (ref 1.005–1.030)
pH: 7 (ref 5.0–8.0)

## 2022-08-20 LAB — CBC
HCT: 36.6 % — ABNORMAL LOW (ref 39.0–52.0)
Hemoglobin: 12.6 g/dL — ABNORMAL LOW (ref 13.0–17.0)
MCH: 31.3 pg (ref 26.0–34.0)
MCHC: 34.4 g/dL (ref 30.0–36.0)
MCV: 91 fL (ref 80.0–100.0)
Platelets: 169 10*3/uL (ref 150–400)
RBC: 4.02 MIL/uL — ABNORMAL LOW (ref 4.22–5.81)
RDW: 13.9 % (ref 11.5–15.5)
WBC: 6.9 10*3/uL (ref 4.0–10.5)
nRBC: 0 % (ref 0.0–0.2)

## 2022-08-20 LAB — COMPREHENSIVE METABOLIC PANEL
ALT: 22 U/L (ref 0–44)
AST: 28 U/L (ref 15–41)
Albumin: 4.3 g/dL (ref 3.5–5.0)
Alkaline Phosphatase: 75 U/L (ref 38–126)
Anion gap: 10 (ref 5–15)
BUN: 17 mg/dL (ref 8–23)
CO2: 20 mmol/L — ABNORMAL LOW (ref 22–32)
Calcium: 9.1 mg/dL (ref 8.9–10.3)
Chloride: 101 mmol/L (ref 98–111)
Creatinine, Ser: 0.97 mg/dL (ref 0.61–1.24)
GFR, Estimated: >60 mL/min (ref >60)
Glucose, Bld: 100 mg/dL — ABNORMAL HIGH (ref 70–99)
Potassium: 3.5 mmol/L (ref 3.5–5.1)
Sodium: 131 mmol/L — ABNORMAL LOW (ref 135–145)
Total Bilirubin: 0.7 mg/dL (ref 0.3–1.2)
Total Protein: 6.8 g/dL (ref 6.5–8.1)

## 2022-08-20 LAB — SURGICAL PCR SCREEN
MRSA, PCR: NEGATIVE
Staphylococcus aureus: POSITIVE — AB

## 2022-08-20 LAB — LACTIC ACID, PLASMA: Lactic Acid, Venous: 1.3 mmol/L (ref 0.5–1.9)

## 2022-08-20 LAB — LIPASE, BLOOD: Lipase: 51 U/L (ref 11–51)

## 2022-08-20 LAB — PROTIME-INR
INR: 1.1 (ref 0.8–1.2)
Prothrombin Time: 13.6 seconds (ref 11.4–15.2)

## 2022-08-20 LAB — HIV ANTIBODY (ROUTINE TESTING W REFLEX): HIV Screen 4th Generation wRfx: NONREACTIVE

## 2022-08-20 SURGERY — REPAIR, HERNIA, INGUINAL, ADULT
Anesthesia: General | Site: Inguinal | Laterality: Left

## 2022-08-20 MED ORDER — BUPIVACAINE-EPINEPHRINE (PF) 0.5% -1:200000 IJ SOLN
INTRAMUSCULAR | Status: DC | PRN
  Administered 2022-08-20: 20 mL via PERINEURAL

## 2022-08-20 MED ORDER — FENTANYL CITRATE (PF) 250 MCG/5ML IJ SOLN
INTRAMUSCULAR | Status: DC | PRN
  Administered 2022-08-20 (×2): 100 ug via INTRAVENOUS
  Administered 2022-08-20: 50 ug via INTRAVENOUS

## 2022-08-20 MED ORDER — ONDANSETRON HCL 4 MG/2ML IJ SOLN: 4.0000 mg | Freq: Four times a day (QID) | INTRAMUSCULAR | Status: DC | PRN

## 2022-08-20 MED ORDER — HYDROMORPHONE HCL 1 MG/ML IJ SOLN
1.0000 mg | Freq: Once | INTRAMUSCULAR | Status: AC
  Administered 2022-08-20: 1 mg via INTRAVENOUS
  Filled 2022-08-20: qty 1

## 2022-08-20 MED ORDER — ONDANSETRON HCL 4 MG/2ML IJ SOLN
4.0000 mg | Freq: Once | INTRAMUSCULAR | Status: AC
  Administered 2022-08-20: 4 mg via INTRAVENOUS
  Filled 2022-08-20: qty 2

## 2022-08-20 MED ORDER — DEXAMETHASONE SODIUM PHOSPHATE 10 MG/ML IJ SOLN: INTRAMUSCULAR | Status: DC | PRN

## 2022-08-20 MED ORDER — DEXAMETHASONE SODIUM PHOSPHATE 10 MG/ML IJ SOLN
INTRAMUSCULAR | Status: AC
  Filled 2022-08-20: qty 1

## 2022-08-20 MED ORDER — MUPIROCIN 2 % EX OINT
1.0000 | TOPICAL_OINTMENT | Freq: Two times a day (BID) | CUTANEOUS | Status: DC
  Administered 2022-08-20 – 2022-08-22 (×6): 1 via NASAL
  Filled 2022-08-20: qty 22

## 2022-08-20 MED ORDER — ROCURONIUM BROMIDE 10 MG/ML (PF) SYRINGE
PREFILLED_SYRINGE | INTRAVENOUS | Status: DC | PRN
  Administered 2022-08-20: 50 mg via INTRAVENOUS

## 2022-08-20 MED ORDER — DEXAMETHASONE SODIUM PHOSPHATE 10 MG/ML IJ SOLN
INTRAMUSCULAR | Status: DC | PRN
  Administered 2022-08-20: 5 mg via INTRAVENOUS

## 2022-08-20 MED ORDER — LACTATED RINGERS IV SOLN: INTRAVENOUS | Status: DC

## 2022-08-20 MED ORDER — SODIUM CHLORIDE (PF) 0.9 % IJ SOLN
INTRAMUSCULAR | Status: AC
  Filled 2022-08-20: qty 50

## 2022-08-20 MED ORDER — PROPOFOL 10 MG/ML IV BOLUS
INTRAVENOUS | Status: DC | PRN
  Administered 2022-08-20: 120 mg via INTRAVENOUS

## 2022-08-20 MED ORDER — TRAZODONE HCL 50 MG PO TABS
50.0000 mg | ORAL_TABLET | Freq: Every evening | ORAL | Status: DC | PRN
  Administered 2022-08-20 – 2022-08-22 (×3): 50 mg via ORAL
  Filled 2022-08-20 (×3): qty 1

## 2022-08-20 MED ORDER — PROPOFOL 10 MG/ML IV BOLUS
INTRAVENOUS | Status: AC
  Filled 2022-08-20: qty 20

## 2022-08-20 MED ORDER — PHENYLEPHRINE HCL-NACL 20-0.9 MG/250ML-% IV SOLN
INTRAVENOUS | Status: DC | PRN
  Administered 2022-08-20: 25 ug/min via INTRAVENOUS

## 2022-08-20 MED ORDER — BUSPIRONE HCL 10 MG PO TABS
10.0000 mg | ORAL_TABLET | Freq: Three times a day (TID) | ORAL | Status: DC
  Administered 2022-08-20: 10 mg via ORAL
  Filled 2022-08-20: qty 1

## 2022-08-20 MED ORDER — ACETAMINOPHEN 10 MG/ML IV SOLN
1000.0000 mg | Freq: Once | INTRAVENOUS | Status: DC | PRN
  Administered 2022-08-20: 1000 mg via INTRAVENOUS

## 2022-08-20 MED ORDER — DOXEPIN HCL 25 MG PO CAPS
25.0000 mg | ORAL_CAPSULE | Freq: Every day | ORAL | Status: DC
  Administered 2022-08-20 – 2022-08-22 (×3): 25 mg via ORAL
  Filled 2022-08-20 (×3): qty 1

## 2022-08-20 MED ORDER — LIDOCAINE HCL (CARDIAC) PF 100 MG/5ML IV SOSY
PREFILLED_SYRINGE | INTRAVENOUS | Status: DC | PRN
  Administered 2022-08-20: 60 mg via INTRAVENOUS

## 2022-08-20 MED ORDER — CHLORHEXIDINE GLUCONATE CLOTH 2 % EX PADS: 6.0000 | MEDICATED_PAD | Freq: Once | CUTANEOUS | Status: DC

## 2022-08-20 MED ORDER — FENTANYL CITRATE PF 50 MCG/ML IJ SOSY
PREFILLED_SYRINGE | INTRAMUSCULAR | Status: AC
  Filled 2022-08-20: qty 2

## 2022-08-20 MED ORDER — LORAZEPAM 1 MG PO TABS
2.0000 mg | ORAL_TABLET | Freq: Every day | ORAL | Status: DC
  Administered 2022-08-20 – 2022-08-22 (×3): 2 mg via ORAL
  Filled 2022-08-20 (×3): qty 2

## 2022-08-20 MED ORDER — ROCURONIUM BROMIDE 10 MG/ML (PF) SYRINGE
PREFILLED_SYRINGE | INTRAVENOUS | Status: AC
  Filled 2022-08-20: qty 10

## 2022-08-20 MED ORDER — PHENYLEPHRINE HCL (PRESSORS) 10 MG/ML IV SOLN
INTRAVENOUS | Status: AC
  Filled 2022-08-20: qty 1

## 2022-08-20 MED ORDER — ONDANSETRON HCL 4 MG/2ML IJ SOLN
INTRAMUSCULAR | Status: DC | PRN
  Administered 2022-08-20: 4 mg via INTRAVENOUS

## 2022-08-20 MED ORDER — SUGAMMADEX SODIUM 200 MG/2ML IV SOLN
INTRAVENOUS | Status: DC | PRN
  Administered 2022-08-20: 200 mg via INTRAVENOUS

## 2022-08-20 MED ORDER — LORAZEPAM 1 MG PO TABS
1.0000 mg | ORAL_TABLET | Freq: Every day | ORAL | Status: DC
  Administered 2022-08-20 – 2022-08-23 (×4): 1 mg via ORAL
  Filled 2022-08-20 (×4): qty 1

## 2022-08-20 MED ORDER — ZOLPIDEM TARTRATE 5 MG PO TABS
5.0000 mg | ORAL_TABLET | Freq: Every evening | ORAL | Status: DC | PRN
  Administered 2022-08-20 – 2022-08-22 (×3): 5 mg via ORAL
  Filled 2022-08-20 (×3): qty 1

## 2022-08-20 MED ORDER — BUPIVACAINE-EPINEPHRINE (PF) 0.5% -1:200000 IJ SOLN
INTRAMUSCULAR | Status: AC
  Filled 2022-08-20: qty 30

## 2022-08-20 MED ORDER — BUPIVACAINE-EPINEPHRINE 0.5% -1:200000 IJ SOLN
INTRAMUSCULAR | Status: DC | PRN
  Administered 2022-08-20: 10 mL

## 2022-08-20 MED ORDER — BUPIVACAINE LIPOSOME 1.3 % IJ SUSP
INTRAMUSCULAR | Status: DC | PRN
  Administered 2022-08-20: 10 mL via PERINEURAL

## 2022-08-20 MED ORDER — MIDAZOLAM HCL 2 MG/2ML IJ SOLN
INTRAMUSCULAR | Status: AC
  Filled 2022-08-20: qty 2

## 2022-08-20 MED ORDER — SODIUM CHLORIDE 0.9 % IV BOLUS
1000.0000 mL | Freq: Once | INTRAVENOUS | Status: AC
  Administered 2022-08-20: 1000 mL via INTRAVENOUS

## 2022-08-20 MED ORDER — ONDANSETRON HCL 4 MG/2ML IJ SOLN
INTRAMUSCULAR | Status: AC
  Filled 2022-08-20: qty 2

## 2022-08-20 MED ORDER — HYDROMORPHONE HCL 1 MG/ML IJ SOLN
1.0000 mg | INTRAMUSCULAR | Status: DC | PRN
  Administered 2022-08-20 – 2022-08-21 (×3): 1 mg via INTRAVENOUS
  Filled 2022-08-20 (×3): qty 1

## 2022-08-20 MED ORDER — BUSPIRONE HCL 10 MG PO TABS
20.0000 mg | ORAL_TABLET | Freq: Three times a day (TID) | ORAL | Status: DC
  Administered 2022-08-20 – 2022-08-23 (×8): 20 mg via ORAL
  Filled 2022-08-20 (×8): qty 2

## 2022-08-20 MED ORDER — AMISULPRIDE (ANTIEMETIC) 5 MG/2ML IV SOLN: 10.0000 mg | Freq: Once | INTRAVENOUS | Status: DC | PRN

## 2022-08-20 MED ORDER — PHENYLEPHRINE 80 MCG/ML (10ML) SYRINGE FOR IV PUSH (FOR BLOOD PRESSURE SUPPORT)
PREFILLED_SYRINGE | INTRAVENOUS | Status: DC | PRN
  Administered 2022-08-20: 240 ug via INTRAVENOUS
  Administered 2022-08-20 (×2): 160 ug via INTRAVENOUS

## 2022-08-20 MED ORDER — FENTANYL CITRATE PF 50 MCG/ML IJ SOSY
PREFILLED_SYRINGE | INTRAMUSCULAR | Status: AC
  Filled 2022-08-20: qty 1

## 2022-08-20 MED ORDER — MIDAZOLAM HCL 2 MG/2ML IJ SOLN
INTRAMUSCULAR | Status: DC | PRN
  Administered 2022-08-20: 2 mg via INTRAVENOUS

## 2022-08-20 MED ORDER — POTASSIUM CHLORIDE IN NACL 20-0.9 MEQ/L-% IV SOLN
INTRAVENOUS | Status: DC
  Filled 2022-08-20 (×7): qty 1000

## 2022-08-20 MED ORDER — FENTANYL CITRATE PF 50 MCG/ML IJ SOSY
25.0000 ug | PREFILLED_SYRINGE | INTRAMUSCULAR | Status: DC | PRN
  Administered 2022-08-20 (×3): 50 ug via INTRAVENOUS

## 2022-08-20 MED ORDER — ACETAMINOPHEN 10 MG/ML IV SOLN
INTRAVENOUS | Status: AC
  Filled 2022-08-20: qty 100

## 2022-08-20 MED ORDER — OXYCODONE HCL 5 MG PO TABS: 5.0000 mg | ORAL_TABLET | Freq: Once | ORAL | Status: DC | PRN

## 2022-08-20 MED ORDER — IOHEXOL 300 MG/ML  SOLN
100.0000 mL | Freq: Once | INTRAMUSCULAR | Status: AC | PRN
  Administered 2022-08-20: 100 mL via INTRAVENOUS

## 2022-08-20 MED ORDER — MIRTAZAPINE 15 MG PO TABS
30.0000 mg | ORAL_TABLET | Freq: Every day | ORAL | Status: DC
  Administered 2022-08-20 – 2022-08-22 (×3): 30 mg via ORAL
  Filled 2022-08-20 (×3): qty 2

## 2022-08-20 MED ORDER — EPHEDRINE 5 MG/ML INJ
INTRAVENOUS | Status: AC
  Filled 2022-08-20: qty 5

## 2022-08-20 MED ORDER — AMLODIPINE BESYLATE 5 MG PO TABS
5.0000 mg | ORAL_TABLET | Freq: Every day | ORAL | Status: DC
  Administered 2022-08-21 – 2022-08-23 (×3): 5 mg via ORAL
  Filled 2022-08-20 (×3): qty 1

## 2022-08-20 MED ORDER — OXYCODONE HCL 5 MG/5ML PO SOLN: 5.0000 mg | Freq: Once | ORAL | Status: DC | PRN

## 2022-08-20 MED ORDER — 0.9 % SODIUM CHLORIDE (POUR BTL) OPTIME
TOPICAL | Status: DC | PRN
  Administered 2022-08-20: 1000 mL

## 2022-08-20 MED ORDER — EPHEDRINE SULFATE-NACL 50-0.9 MG/10ML-% IV SOSY
PREFILLED_SYRINGE | INTRAVENOUS | Status: DC | PRN
  Administered 2022-08-20 (×3): 5 mg via INTRAVENOUS

## 2022-08-20 MED ORDER — PREGABALIN 75 MG PO CAPS
75.0000 mg | ORAL_CAPSULE | Freq: Two times a day (BID) | ORAL | Status: DC
  Administered 2022-08-20 – 2022-08-23 (×7): 75 mg via ORAL
  Filled 2022-08-20 (×7): qty 1

## 2022-08-20 MED ORDER — CEFAZOLIN SODIUM-DEXTROSE 2-4 GM/100ML-% IV SOLN
2.0000 g | INTRAVENOUS | Status: AC
  Administered 2022-08-20: 2 g via INTRAVENOUS
  Filled 2022-08-20: qty 100

## 2022-08-20 MED ORDER — PHENYLEPHRINE 80 MCG/ML (10ML) SYRINGE FOR IV PUSH (FOR BLOOD PRESSURE SUPPORT)
PREFILLED_SYRINGE | INTRAVENOUS | Status: AC
  Filled 2022-08-20: qty 10

## 2022-08-20 MED ORDER — ACETAMINOPHEN 325 MG PO TABS
650.0000 mg | ORAL_TABLET | Freq: Four times a day (QID) | ORAL | Status: DC | PRN
  Administered 2022-08-21 – 2022-08-23 (×5): 650 mg via ORAL
  Filled 2022-08-20 (×6): qty 2

## 2022-08-20 MED ORDER — FENTANYL CITRATE (PF) 250 MCG/5ML IJ SOLN
INTRAMUSCULAR | Status: AC
  Filled 2022-08-20: qty 5

## 2022-08-20 MED ORDER — OXYCODONE HCL 5 MG PO TABS
5.0000 mg | ORAL_TABLET | ORAL | Status: DC | PRN
  Administered 2022-08-20 – 2022-08-22 (×5): 5 mg via ORAL
  Filled 2022-08-20 (×5): qty 1

## 2022-08-20 MED ORDER — LIDOCAINE HCL (PF) 2 % IJ SOLN
INTRAMUSCULAR | Status: AC
  Filled 2022-08-20: qty 5

## 2022-08-20 MED ORDER — ENOXAPARIN SODIUM 40 MG/0.4ML IJ SOSY
40.0000 mg | PREFILLED_SYRINGE | INTRAMUSCULAR | Status: DC
  Administered 2022-08-21 – 2022-08-23 (×3): 40 mg via SUBCUTANEOUS
  Filled 2022-08-20 (×4): qty 0.4

## 2022-08-20 MED ORDER — LOSARTAN POTASSIUM 50 MG PO TABS
100.0000 mg | ORAL_TABLET | Freq: Every day | ORAL | Status: DC
  Administered 2022-08-21 – 2022-08-23 (×3): 100 mg via ORAL
  Filled 2022-08-20 (×3): qty 2

## 2022-08-20 MED ORDER — ONDANSETRON 4 MG PO TBDP
4.0000 mg | ORAL_TABLET | Freq: Four times a day (QID) | ORAL | Status: DC | PRN
  Administered 2022-08-21: 4 mg via ORAL
  Filled 2022-08-20: qty 1

## 2022-08-20 SURGICAL SUPPLY — 44 items
APL PRP STRL LF DISP 70% ISPRP (MISCELLANEOUS) ×1
APL SKNCLS STERI-STRIP NONHPOA (GAUZE/BANDAGES/DRESSINGS) ×1
BAG COUNTER SPONGE SURGICOUNT (BAG) IMPLANT
BAG SPNG CNTER NS LX DISP (BAG)
BENZOIN TINCTURE PRP APPL 2/3 (GAUZE/BANDAGES/DRESSINGS) ×2 IMPLANT
BLADE HEX COATED 2.75 (ELECTRODE) ×2 IMPLANT
CHLORAPREP W/TINT 26 (MISCELLANEOUS) ×2 IMPLANT
COVER SURGICAL LIGHT HANDLE (MISCELLANEOUS) ×2 IMPLANT
DISSECTOR ROUND CHERRY 3/8 STR (MISCELLANEOUS) IMPLANT
DRAIN PENROSE 0.5X18 (DRAIN) IMPLANT
DRAPE LAPAROTOMY TRNSV 102X78 (DRAPES) ×2 IMPLANT
DRAPE UTILITY XL STRL (DRAPES) ×2 IMPLANT
DRSG TEGADERM 4X4.75 (GAUZE/BANDAGES/DRESSINGS) IMPLANT
ELECT COATED BLADE 2.86 ST (ELECTRODE) IMPLANT
ELECT REM PT RETURN 15FT ADLT (MISCELLANEOUS) ×2 IMPLANT
GAUZE 4X4 16PLY ~~LOC~~+RFID DBL (SPONGE) IMPLANT
GAUZE SPONGE 4X4 12PLY STRL (GAUZE/BANDAGES/DRESSINGS) ×2 IMPLANT
GLOVE BIO SURGEON STRL SZ7 (GLOVE) ×2 IMPLANT
GLOVE BIOGEL PI IND STRL 7.5 (GLOVE) ×2 IMPLANT
GOWN STRL REUS W/ TWL LRG LVL3 (GOWN DISPOSABLE) IMPLANT
GOWN STRL REUS W/TWL LRG LVL3 (GOWN DISPOSABLE)
KIT BASIN OR (CUSTOM PROCEDURE TRAY) ×2 IMPLANT
KIT TURNOVER KIT A (KITS) IMPLANT
MARKER SKIN DUAL TIP RULER LAB (MISCELLANEOUS) ×2 IMPLANT
NDL HYPO 25X1 1.5 SAFETY (NEEDLE) ×2 IMPLANT
NEEDLE HYPO 25X1 1.5 SAFETY (NEEDLE) ×1 IMPLANT
PACK GENERAL/GYN (CUSTOM PROCEDURE TRAY) ×2 IMPLANT
SPIKE FLUID TRANSFER (MISCELLANEOUS) ×2 IMPLANT
STRIP CLOSURE SKIN 1/2X4 (GAUZE/BANDAGES/DRESSINGS) ×2 IMPLANT
SUT MNCRL AB 4-0 PS2 18 (SUTURE) ×2 IMPLANT
SUT NOVA NAB GS-22 2 0 T19 (SUTURE) IMPLANT
SUT PROLENE 2 0 SH DA (SUTURE) IMPLANT
SUT SILK 3 0 (SUTURE)
SUT SILK 3-0 18XBRD TIE 12 (SUTURE) IMPLANT
SUT VIC AB 2-0 CT2 27 (SUTURE) IMPLANT
SUT VIC AB 2-0 SH 27 (SUTURE) ×1
SUT VIC AB 2-0 SH 27X BRD (SUTURE) ×2 IMPLANT
SUT VIC AB 3-0 SH 27 (SUTURE) ×1
SUT VIC AB 3-0 SH 27X BRD (SUTURE) ×2 IMPLANT
SUT VICRYL 0 27 CT2 27 ABS (SUTURE) IMPLANT
SUT VICRYL 0 UR6 27IN ABS (SUTURE) IMPLANT
SYR 20ML LL LF (SYRINGE) ×2 IMPLANT
TOWEL OR 17X26 10 PK STRL BLUE (TOWEL DISPOSABLE) ×2 IMPLANT
TOWEL OR NON WOVEN STRL DISP B (DISPOSABLE) ×2 IMPLANT

## 2022-08-20 NOTE — Plan of Care (Signed)

## 2022-08-20 NOTE — Plan of Care (Signed)
Plan of care discussed.   

## 2022-08-20 NOTE — H&P (Signed)
Admission Note  [name] 1955-11-18  [id number].    Requesting MD: Dr. Pilar Plate Chief Complaint/Reason for Consult: Right inguinal hernia, left  HPI:  66 y.o. male with medical history significant for DJD, GERD, HTN, anxiety/depression who presented to emergency department with constipation and worsening left lower quadrant pain radiating into his left scrotum.  He states he has had increasingly severe constipation since October of this year that he has attributed to increasing stress and potentially medication side effects.  He has seen his PCP for this as well as had a referral to [facility].  He has been started on Linzess samples.  He has used various laxatives and stool softeners at home but most frequently uses MiraLAX daily.  He notes only having bowel movements approximately every 5 days and having to strain.  Last bowel movement 5 days ago.  He is passing flatus but strains with this as well.  He endorses decreased appetite but no nausea, vomiting, abdominal distention.  He has lost weight, approximately 20 pounds since October.  He denies fever, chills, urinary symptoms.  CT scan in the emergency department shows a left inguinal hernia containing a knuckle of bowel without signs of obstruction..  He has had open left inguinal hernia repair with mesh in 2012 and right inguinal hernia repair with mesh in 2015 by Dr. Daphine Deutscher   Overnight last night and this morning he continues to have pain in his left lower quadrant radiating into his scrotum.  Last oral intake was water around midnight.    ROS: ROS  Family History  Problem Relation Age of Onset   Cancer Mother        breast   Intracerebral hemorrhage Father     Past Medical History:  Diagnosis Date   Allergy    Anxiety    Depression    DJD (degenerative joint disease), cervical    postiton with pillow under knees, cant turn neck    Dysentery, amebic, acute 1981   GERD (gastroesophageal reflux disease)    H/O  bronchitis    H/O malaria 1984   Hearing loss    bilateral    Hypertension    labile Blood pressure   Inguinal hernia    Insomnia    early morning awakening   MVP (mitral valve prolapse)    "no problems"   Perianal pain    Personal history of colonic polyps    Tinnitus    right ear    Past Surgical History:  Procedure Laterality Date   CARPAL TUNNEL RELEASE  10/06, 5/10   right wrist    CARPAL TUNNEL RELEASE  3/10   left wrist    cervical spine discectomy   09/2005   COLONOSCOPY WITH PROPOFOL N/A 10/21/2015   Procedure: COLONOSCOPY WITH PROPOFOL;  Surgeon: Charolett Bumpers, MD;  Location: [facility];  Service: Endoscopy;  Laterality: N/A;   INGUINAL HERNIA REPAIR  08/16/2011   Procedure: HERNIA REPAIR INGUINAL ADULT;  Surgeon: Valarie Merino, MD;  Location: [facility];  Service: General;  Laterality: Left;   INGUINAL HERNIA REPAIR Right 05/03/2014   Procedure: OPEN RIGHT INGUINAL HERNIA REPAIR WITH MESH;  Surgeon: Wenda Low, MD;  Location: [facility];  Service: General;  Laterality: Right;   INSERTION OF MESH Right 05/03/2014   Procedure: INSERTION OF MESH;  Surgeon: Wenda Low, MD;  Location: [facility];  Service: General;  Laterality: Right;   TONSILLECTOMY  1960    Social History:  reports that he has never smoked. He has never used smokeless tobacco. He reports current alcohol use. He reports current drug use. Drug: Marijuana.  Allergies:  Allergies  Allergen Reactions   Gabapentin Other (See Comments)    Unable to urinate, drowsiness   Trileptal [Oxcarbazepine] Other (See Comments)    Bad taste in his mouth    Medications Prior to Admission  Medication Sig Dispense Refill   Alpha-Lipoic Acid 600 MG CAPS Take 1 capsule by mouth daily.     amLODipine (NORVASC) 5 MG tablet Take 2 tablets (10 mg total) by mouth daily. (Patient taking differently: Take 5 mg by mouth daily.) 90 tablet 1   atorvastatin (LIPITOR) 10 MG tablet Take 10 mg by mouth 3 (three) times  a week. MWF     busPIRone (BUSPAR) 10 MG tablet Take 20 mg by mouth 3 (three) times daily.      Cholecalciferol (VITAMIN D3) 125 MCG (5000 UT) TABS Take 1 tablet by mouth daily.     Coenzyme Q10 50 MG CAPS Take 1 capsule by mouth daily.     doxepin (SINEQUAN) 25 MG capsule Take 25 mg by mouth at bedtime.     LORazepam (ATIVAN) 1 MG tablet Take 1-2 mg by mouth 2 (two) times daily. Take 1 tablet in the morning and Take 2 tablets at bedtime     losartan (COZAAR) 100 MG tablet Take 100 mg by mouth daily.     mirtazapine (REMERON) 30 MG tablet Take 30 mg by mouth at bedtime.     Multiple Vitamin (MULTIVITAMIN) tablet Take 1 tablet by mouth daily.     Omega-3 1000 MG CAPS Take 1 capsule by mouth daily.     pregabalin (LYRICA) 75 MG capsule Take 75 mg by mouth 2 (two) times daily.     traZODone (DESYREL) 50 MG tablet Take 50 mg by mouth at bedtime.     zinc gluconate 50 MG tablet Take 50 mg by mouth daily.     zolpidem (AMBIEN CR) 12.5 MG CR tablet Take 12.5 mg by mouth at bedtime.      memantine (NAMENDA) 10 MG tablet Take 1 tablet (10 mg total) by mouth 2 (two) times daily. (Patient not taking: Reported on 08/20/2022) 60 tablet 2   Methylfol-Methylcob-Acetylcyst (CEREFOLIN NAC) 6-2-600 MG TABS 1 a day (Patient not taking: Reported on 08/20/2022) 30 tablet 0   oxyCODONE-acetaminophen (PERCOCET) 5-325 MG tablet Take 1 tablet by mouth every 6 (six) hours as needed for severe pain. (Patient not taking: Reported on 08/20/2022) 30 tablet 0   Prenatal Vit-Fe Fumarate-FA (PRENATAL MULTIVITAMIN) TABS tablet Take 1 tablet by mouth daily. (Patient not taking: Reported on 08/20/2022) 90 tablet 1    Blood pressure (!) 143/98, pulse 63, temperature 98.6 F (37 C), temperature source Oral, resp. rate 16, height 5\' 9"  (1.753 m), weight 63.9 kg, SpO2 100 %. Physical Exam: General: pleasant, WD, male who is laying in bed in NAD HEENT: head is normocephalic, atraumatic.  Sclera are noninjected.  Pupils equal and  round. EOMs intact.  Ears and nose without any masses or lesions.  Mouth is pink and moist Heart: regular, rate, and rhythm.  Normal s1,s2. No obvious murmurs, gallops, or rubs noted.  Palpable radial and pedal pulses bilaterally Lungs: CTAB, no wheezes, rhonchi, or rales noted.  Respiratory effort nonlabored Abd: soft, NT, ND, +BS, no masses, hernias, or organomegaly GU: TTP in left inguinal region with very small palpable hernia. No overlying skin changes. Chaperone present MSK:  all 4 extremities are symmetrical with no cyanosis, clubbing, or edema. Skin: warm and dry with no masses, lesions, or rashes Neuro: Cranial nerves 2-12 grossly intact, sensation is normal throughout Psych: A&Ox3 with an appropriate affect.    Results for orders placed or performed during the hospital encounter of 08/19/22 (from the past 48 hour(s))  CBC     Status: Abnormal   Collection Time: 08/20/22  1:04 AM  Result Value Ref Range   WBC 6.9 4.0 - 10.5 K/uL   RBC 4.02 (L) 4.22 - 5.81 MIL/uL   Hemoglobin 12.6 (L) 13.0 - 17.0 g/dL   HCT 14.736.6 (L) 82.939.0 - 56.252.0 %   MCV 91.0 80.0 - 100.0 fL   MCH 31.3 26.0 - 34.0 pg   MCHC 34.4 30.0 - 36.0 g/dL   RDW 13.013.9 86.511.5 - 78.415.5 %   Platelets 169 150 - 400 K/uL   nRBC 0.0 0.0 - 0.2 %    Comment: Performed at [facility], [address] [place], [place] [address]  Comprehensive metabolic panel     Status: Abnormal   Collection Time: 08/20/22  1:04 AM  Result Value Ref Range   Sodium 131 (L) 135 - 145 mmol/L   Potassium 3.5 3.5 - 5.1 mmol/L   Chloride 101 98 - 111 mmol/L   CO2 20 (L) 22 - 32 mmol/L   Glucose, Bld 100 (H) 70 - 99 mg/dL    Comment: Glucose reference range applies only to samples taken after fasting for at least 8 hours.   BUN 17 8 - 23 mg/dL   Creatinine, Ser 5.280.97 0.61 - 1.24 mg/dL   Calcium 9.1 8.9 - 41.310.3 mg/dL   Total Protein 6.8 6.5 - 8.1 g/dL   Albumin 4.3 3.5 - 5.0 g/dL   AST 28 15 - 41 U/L   ALT 22 0 - 44 U/L   Alkaline  Phosphatase 75 38 - 126 U/L   Total Bilirubin 0.7 0.3 - 1.2 mg/dL   GFR, Estimated >24>60 >40>60 mL/min    Comment: (NOTE) Calculated using the CKD-EPI Creatinine Equation (2021)    Anion gap 10 5 - 15    Comment: Performed at [facility], [address] [place], [place] [address]  Lipase, blood     Status: None   Collection Time: 08/20/22  1:04 AM  Result Value Ref Range   Lipase 51 11 - 51 U/L    Comment: Performed at [facility], [address] [place], [place] [address]  Protime-INR     Status: None   Collection Time: 08/20/22  1:04 AM  Result Value Ref Range   Prothrombin Time 13.6 11.4 - 15.2 seconds   INR 1.1 0.8 - 1.2    Comment: (NOTE) INR goal varies based on device and disease states. Performed at [facility], [address] [place], [place] [address]   Lactic acid, plasma     Status: None   Collection Time: 08/20/22  1:04 AM  Result Value Ref Range   Lactic Acid, Venous 1.3 0.5 - 1.9 mmol/L    Comment: Performed at [facility], [address] [place], [place] [address]  Urinalysis, Routine w reflex microscopic     Status: Abnormal   Collection Time: 08/20/22  3:33 AM  Result Value Ref Range   Color, Urine COLORLESS (A) YELLOW   APPearance CLEAR CLEAR   Specific Gravity, Urine 1.014 1.005 - 1.030   pH 7.0 5.0 -  8.0   Glucose, UA NEGATIVE NEGATIVE mg/dL   Hgb urine dipstick NEGATIVE NEGATIVE   Bilirubin Urine NEGATIVE NEGATIVE   Ketones, ur NEGATIVE NEGATIVE mg/dL   Protein, ur NEGATIVE NEGATIVE mg/dL   Nitrite NEGATIVE NEGATIVE   Leukocytes,Ua NEGATIVE NEGATIVE    Comment: Performed at [facility], [address] [place], [place] [address]   CT ABDOMEN PELVIS W CONTRAST  Result Date: 08/20/2022 CLINICAL DATA:  Abdominal pain, known hernia EXAM: CT ABDOMEN AND PELVIS WITH CONTRAST TECHNIQUE: Multidetector CT imaging of the abdomen and pelvis was performed using  the standard protocol following bolus administration of intravenous contrast. RADIATION DOSE REDUCTION: This exam was performed according to the departmental dose-optimization program which includes automated exposure control, adjustment of the mA and/or kV according to patient size and/or use of iterative reconstruction technique. CONTRAST:  OMNIPAQUE IOHEXOL 300 MG/ML  SOLN COMPARISON:  07/13/2022 FINDINGS: Lower chest: Lung bases are clear. Hepatobiliary: Small hepatic cysts, unchanged from prior, benign benign. Gallbladder is unremarkable. No intrahepatic or extrahepatic duct dilatation. Pancreas: Subtle hypoenhancement along the pancreatic uncinate process (series 2/image 28), measuring 2.6 x 1.8 cm. This is equivocal but warrants additional evaluation to exclude a true pancreatic lesion in this location. No pancreatic atrophy or ductal effusion. Spleen: Within normal limits. Adrenals/Urinary Tract: Adrenal glands are within normal limits. Kidneys are within normal limits.  No hydronephrosis. Bladder is within normal limits. Stomach/Bowel: Stomach is within normal limits. No evidence of bowel obstruction. Appendix is not discretely visualized. Mild sigmoid diverticulosis, without evidence of diverticulitis. Vascular/Lymphatic: No evidence of abdominal aortic aneurysm. Atherosclerotic calcifications of the abdominal aorta and branch vessels. No suspicious abdominopelvic lymphadenopathy. Reproductive: Prostate is unremarkable. Other: No abdominopelvic ascites. Tiny left inguinal hernia containing a knuckle of nondilated small bowel (series 2/image 61). Musculoskeletal: Degenerative changes of the visualized thoracolumbar spine. IMPRESSION: Tiny left inguinal hernia containing a knuckle of nondilated small bowel. No evidence of bowel obstruction. Subtle hypoenhancement along the pancreatic uncinate process, measuring 2.6 x 1.8 cm. This is equivocal but warrants additional evaluation to exclude a true  pancreatic lesion in this location. MRI abdomen with/without contrast is suggested for further evaluation. Electronically Signed   By: Charline Bills M.D.   On: 08/20/2022 02:56      Assessment/Plan Incarcerated nonobstructing left inguinal hernia Constipation Patient seen examined and relevant labs and imaging reviewed.  Workup consistent with incarcerated but nonobstructing left inguinal hernia in the setting of significant constipation.  He has a history of prior open inguinal hernia repair with mesh.  Recommend surgical intervention today with repeat open repair, possible mesh. In setting of ongoing constipation he has a high risk of recurrence, recommend he continue to follow-up with PCP and GI for constipation management and prevention.  FEN: NPO ID: [email or web address] periop VTE: LMWH  Incidental finding of hypoenhancement of pancrease on CT - follow up with PCP for possible MRI. Discussed with patient HTN - home meds to start tomorrow Anxiety/depression - home meds DJD  I reviewed ED provider notes, last 24 h vitals and pain scores, last 48 h intake and output, last 24 h labs and trends, and last 24 h imaging results.   Eric Form, [facility] Surgery 08/20/2022, 7:17 AM Please see Amion for pager number during day hours 7:00am-4:30pm

## 2022-08-20 NOTE — Anesthesia Preprocedure Evaluation (Signed)
Anesthesia Evaluation  Patient identified by MRN, date of birth, ID band Patient awake    Reviewed: Allergy & Precautions, NPO status , Patient's Chart, lab work & pertinent test results  History of Anesthesia Complications Negative for: history of anesthetic complications  Airway Mallampati: II  TM Distance: >3 FB Neck ROM: Full    Dental no notable dental hx.    Pulmonary    Pulmonary exam normal        Cardiovascular hypertension, Pt. on medications Normal cardiovascular exam     Neuro/Psych   Anxiety Depression       GI/Hepatic ,GERD  ,,(+)     substance abuse  marijuana useleft inguinal hernia   Endo/Other    Renal/GU      Musculoskeletal  (+) Arthritis ,    Abdominal   Peds  Hematology  (+) Blood dyscrasia (Hgb 12.6), anemia   Anesthesia Other Findings   Reproductive/Obstetrics                             Anesthesia Physical Anesthesia Plan  ASA: 2  Anesthesia Plan: General   Post-op Pain Management: Precedex and Regional block*   Induction: Intravenous  PONV Risk Score and Plan: 2 and Treatment may vary due to age or medical condition, Ondansetron, Dexamethasone and Midazolam  Airway Management Planned: Oral ETT  Additional Equipment: None  Intra-op Plan:   Post-operative Plan: Extubation in OR  Informed Consent: I have reviewed the patients History and Physical, chart, labs and discussed the procedure including the risks, benefits and alternatives for the proposed anesthesia with the patient or authorized representative who has indicated his/her understanding and acceptance.     Dental advisory given  Plan Discussed with: CRNA  Anesthesia Plan Comments:        Anesthesia Quick Evaluation

## 2022-08-20 NOTE — Anesthesia Postprocedure Evaluation (Signed)
Anesthesia Post Note  Patient: [name]  Procedure(s) Performed: HERNIA REPAIR INGUINAL ADULT (Left: Inguinal)     Patient location during evaluation: PACU Anesthesia Type: General Level of consciousness: awake and alert Pain management: pain level controlled Vital Signs Assessment: post-procedure vital signs reviewed and stable Respiratory status: spontaneous breathing, nonlabored ventilation and respiratory function stable Cardiovascular status: blood pressure returned to baseline Postop Assessment: no apparent nausea or vomiting Anesthetic complications: no   No notable events documented.  Last Vitals:  Vitals:   08/20/22 1745 08/20/22 1800  BP: (!) 141/88 (!) 160/94  Pulse: 68 74  Resp: (!) 23 (!) 22  Temp:    SpO2: 100% 100%    Last Pain:  Vitals:   08/20/22 1800  TempSrc:   PainSc: 6                  Shanda Howells

## 2022-08-20 NOTE — TOC CM/SW Note (Signed)
Transition of Care [facility]) Screening Note  Patient Details  Name: [name] Date of Birth: 02/27/1956  Transition of Care [facility]) CM/SW Contact:    Ewing Schlein, LCSW Phone Number: 08/20/2022, 10:25 AM  Transition of Care Department [facility]) has reviewed patient and no TOC needs have been identified at this time. We will continue to monitor patient advancement through interdisciplinary progression rounds. If new patient transition needs arise, please place a TOC consult.

## 2022-08-20 NOTE — Anesthesia Procedure Notes (Signed)
Anesthesia Regional Block: TAP block   Pre-Anesthetic Checklist: , timeout performed,  Correct Patient, Correct Site, Correct Laterality,  Correct Procedure, Correct Position, site marked,  Risks and benefits discussed,  Pre-op evaluation,  At surgeon's request and post-op pain management  Laterality: Left  Prep: Maximum Sterile Barrier Precautions used, chloraprep       Needles:  Injection technique: Single-shot  Needle Type: Echogenic Stimulator Needle     Needle Length: 9cm  Needle Gauge: 21     Additional Needles:   Narrative:  Start time: 08/20/2022 4:23 PM End time: 08/20/2022 4:25 PM Injection made incrementally with aspirations every 5 mL. Anesthesiologist: Kaylyn Layer, MD  Additional Notes: Risks, benefits, and alternative discussed. Patient gave consent for procedure. Patient prepped and draped in sterile fashion. Sedation administered, patient remains easily responsive to voice. Relevant anatomy identified with ultrasound guidance. Local anesthetic given in 5cc increments with no signs or symptoms of intravascular injection. No pain or paraesthesias with injection. Patient monitored throughout procedure with signs of LAST or immediate complications. Tolerated well. Ultrasound image placed in chart. Amalia Greenhouse, MD

## 2022-08-20 NOTE — Anesthesia Procedure Notes (Signed)
Procedure Name: Intubation Date/Time: 08/20/2022 4:20 PM  Performed by: Raenette Rover, CRNAPre-anesthesia Checklist: Patient identified, Emergency Drugs available, Suction available and Patient being monitored Patient Re-evaluated:Patient Re-evaluated prior to induction Oxygen Delivery Method: Circle system utilized Preoxygenation: Pre-oxygenation with 100% oxygen Induction Type: IV induction Ventilation: Mask ventilation without difficulty Laryngoscope Size: Mac and 4 Grade View: Grade I Tube type: Oral Tube size: 7.5 mm Number of attempts: 1 Airway Equipment and Method: Stylet Placement Confirmation: ETT inserted through vocal cords under direct vision, positive ETCO2 and breath sounds checked- equal and bilateral Secured at: 22 cm Tube secured with: Tape Dental Injury: Teeth and Oropharynx as per pre-operative assessment

## 2022-08-20 NOTE — Op Note (Signed)
Hernia, Open, Procedure Note  Indications: The patient presented with a history of bilateral inguinal hernia repairs with mesh in the distant past.  He presents with recent onset of left groin pain.  CT scan revealed a very small recurrence in the left groin that may contain a small section of nonobstructed bowel.  He has no signs of obstruction.  However due to the scan and his persistent pain, we recommended exploration and repair.  Pre-operative Diagnosis: left recurrent inguinal hernia Post-operative Diagnosis: same  Surgeon: Wynona Luna   Assistants: None  Anesthesia: General endotracheal anesthesia with TAP block  ASA Class: 2  Procedure Details  The patient was seen again in the Holding Room. The risks, benefits, complications, treatment options, and expected outcomes were discussed with the patient. The possibilities of reaction to medication, pulmonary aspiration, perforation of viscus, bleeding, recurrent infection, the need for additional procedures, and development of a complication requiring transfusion or further operation were discussed with the patient and/or family. The likelihood of success in repairing the hernia and returning the patient to their previous functional status is good.  There was concurrence with the proposed plan, and informed consent was obtained. The site of surgery was properly noted/marked. The patient was taken to the Operating Room, identified as Clarion Mooneyhan Albaugh, and the procedure verified as left inguinal hernia repair. A Time Out was held and the above information confirmed.  The patient was placed in the supine position and underwent induction of anesthesia. The lower abdomen and groin was prepped with Chloraprep and draped in the standard fashion, and 0.25% Marcaine with epinephrine was used to anesthetize the skin over the mid-portion of the inguinal canal. An oblique incision was made through his old scar. Dissection was carried down through the  subcutaneous tissue with cautery to the external oblique fascia.  We opened the external oblique fascia along the direction of its fibers to the external ring.  We bluntly dissected the fascia away from the underlying mesh.  The spermatic cord was circumferentially dissected bluntly and retracted with a Penrose drain.   The floor of the inguinal canal was covered with mesh and did not reveal any sign of direct defect.  However the mesh had stretched and was quite lax.  We skeletonized the spermatic cord and identified a loose internal ring with some protruding fat.  We dissected the fat away from the cord and reduced through the internal ring.  The internal ring was tightened with interrupted 2-0 Novafil..  I imbricated the mesh covering the floor of the inguinal canal with 2-0 Novafil sutures.  The external oblique fascia was reapproximated with 2-0 Vicryl.  3-0 Vicryl was used to close the subcutaneous tissues and 4-0 Monocryl was used to close the skin in subcuticular fashion.  Benzoin and steri-strips were used to seal the incision.  A clean dressing was applied.  The patient was then extubated and brought to the recovery room in stable condition.  All sponge, instrument, and needle counts were correct prior to closure and at the conclusion of the case.   Estimated Blood Loss: Minimal                 Complications: None; patient tolerated the procedure well.         Disposition: PACU - hemodynamically stable.         Condition: stable  Wilmon Arms. Corliss Skains, MD, [facility] Surgery  General Surgery   08/20/2022 5:28 PM

## 2022-08-20 NOTE — TOC Initial Note (Signed)
Transition of Care [facility]) - Initial/Assessment Note   Patient Details  Name: Brad [name] MRN: [id number] Date of Birth: 1955-10-12  Transition of Care [facility] CM/SW Contact:    Ewing Schlein, LCSW Phone Number: 08/20/2022, 2:03 PM  Clinical Narrative: [facility] consulted for possible transportation/discharge needs. CSW spoke with patient regarding consult. Patient stated, "maybe I can get a ride from my house cleaning lady" as patient's spouse is out of the country. Patient asked about nursing services. CSW explained TOC can set up [facility] if there is a specific skillable need. Patient reported he would need assistance with meals and other IADLs. CSW explained this would be PCS rather than HH and is private pay as it is not covered by insurance, but due to the upcoming holidays it will likely be difficult to find a PCS agency that has availability at this time. Patient stated, "I'm not even going to try" to find a PCS agency before the holiday. As patient can obtain a ride home, TOC will sign off at this time.  Expected Discharge Plan: Home/Self Care Barriers to Discharge: No Barriers Identified  Patient Goals and CMS Choice Patient states their goals for this hospitalization and ongoing recovery are:: Get home at home Choice offered to / list presented to : NA  Expected Discharge Plan and Services In-house Referral: Clinical Social Work Post Acute Care Choice: NA Living arrangements for the past 2 months: Single Family Home           DME Arranged: N/A DME Agency: NA  Prior Living Arrangements/Services Living arrangements for the past 2 months: Single Family Home Lives with:: Spouse Patient language and need for interpreter reviewed:: Yes Do you feel safe going back to the place where you live?: Yes      Need for Family Participation in Patient Care: No (Comment) Care giver support system in place?: Yes (comment) Criminal Activity/Legal Involvement Pertinent to Current Situation/Hospitalization:  No - Comment as needed  Activities of Daily Living Home Assistive Devices/Equipment: None ADL Screening (condition at time of admission) Patient's cognitive ability adequate to safely complete daily activities?: Yes Is the patient deaf or have difficulty hearing?: No Does the patient have difficulty seeing, even when wearing glasses/contacts?: No Does the patient have difficulty concentrating, remembering, or making decisions?: No Patient able to express need for assistance with ADLs?: Yes Does the patient have difficulty dressing or bathing?: No Independently performs ADLs?: Yes (appropriate for developmental age) Does the patient have difficulty walking or climbing stairs?: No Weakness of Legs: None Weakness of Arms/Hands: None  Emotional Assessment Attitude/Demeanor/Rapport: Engaged Affect (typically observed): Appropriate Orientation: : Oriented to Self, Oriented to Place, Oriented to  Time, Oriented to Situation Alcohol / Substance Use: Not Applicable  Admission diagnosis:  LLQ pain [R10.32] Incarcerated left inguinal hernia [K40.30] Patient Active Problem List   Diagnosis Date Noted   Incarcerated left inguinal hernia 08/20/2022   MDD (major depressive disorder) 02/27/2019   Paresthesias 06/28/2018   Right inguinal hernia 04/12/2014   Inguinal hernia, left-repair [facility] Dec 2012 09/10/2011   PCP:  [facility] Pharmacy:   [facility] [id number] Gin[place] Otto, [place] - [address] [place] [address] Phone: [phone] Fax: [phone]  Social Determinants of Health (SDOH) Social History: SDOH Screenings   Food Insecurity: No Food Insecurity (08/20/2022)  Housing: Low Risk  (08/20/2022)  Transportation Needs: No Transportation Needs (08/20/2022)  Utilities: Not At Risk (08/20/2022)  Alcohol Screen: Low Risk  (  02/27/2019)  Tobacco Use: Low Risk  (08/20/2022)   SDOH Interventions: N/A  Readmission Risk  Interventions     No data to display

## 2022-08-20 NOTE — Discharge Instructions (Addendum)
HERNIA REPAIR: POST OP INSTRUCTIONS  ######################################################################  EAT Gradually transition to a high fiber diet with a fiber supplement over the next few weeks after discharge.  Start with a pureed / full liquid diet (see below)  WALK Walk an hour a day.  Control your pain to do that.    CONTROL PAIN Control pain so that you can walk, sleep, tolerate sneezing/coughing, and go up/down stairs.  HAVE A BOWEL MOVEMENT DAILY Keep your bowels regular to avoid problems.  OK to try a laxative to override constipation.  OK to use an antidairrheal to slow down diarrhea.  Call if not better after 2 tries  CALL IF YOU HAVE PROBLEMS/CONCERNS Call if you are still struggling despite following these instructions. Call if you have concerns not answered by these instructions  ######################################################################    DIET: Follow a light bland diet & liquids the first 24 hours after arrival home, such as soup, liquids, starches, etc.  Be sure to drink plenty of fluids.  Quickly advance to a usual solid diet within a few days.  Avoid fast food or heavy meals as your are more likely to get nauseated or have irregular bowels.  A low-fat, high-fiber diet for the rest of your life is ideal.   Take your usually prescribed home medications unless otherwise directed.  PAIN CONTROL: Pain is best controlled by a usual combination of three different methods TOGETHER: Ice/Heat Over the counter pain medication Prescription pain medication Most patients will experience some swelling and bruising around the hernia(s) such as the bellybutton, groins, or old incisions.  Ice packs or heating pads (30-60 minutes up to 6 times a day) will help. Use ice for the first few days to help decrease swelling and bruising, then switch to heat to help relax tight/sore spots and speed recovery.  Some people prefer to use ice alone, heat alone, alternating  between ice & heat.  Experiment to what works for you.  Swelling and bruising can take several weeks to resolve.   It is helpful to take an over-the-counter pain medication regularly for the first few weeks.  Choose one of the following that works best for you: Naproxen (Aleve, etc)  Two 244m tabs twice a day Ibuprofen (Advil, etc) Three 2045mtabs four times a day (every meal & bedtime) Acetaminophen (Tylenol, etc) 325-65043mour times a day (every meal & bedtime) A  prescription for pain medication should be given to you upon discharge.  Take your pain medication as prescribed.  If you are having problems/concerns with the prescription medicine (does not control pain, nausea, vomiting, rash, itching, etc), please call us [phone] see if we need to switch you to a different pain medicine that will work better for you and/or control your side effect better. If you need a refill on your pain medication, please contact your pharmacy.  They will contact our office to request authorization. Prescriptions will not be filled after 5 pm or on week-ends.  Avoid getting constipated.  Between the surgery and the pain medications, it is common to experience some constipation.  Increasing fluid intake and taking a fiber supplement (such as Metamucil, Citrucel, FiberCon, MiraLax, etc) 1-2 times a day regularly will usually help prevent this problem from occurring.  A mild laxative (prune juice, Milk of Magnesia, MiraLax, etc) should be taken according to package directions if there are no bowel movements after 48 hours.    Wash / shower every day.  You may shower over the dressings  as they are waterproof.    It is good for closed incisions and even open wounds to be washed every day.  Shower every day.  Short baths are fine.  Wash the incisions and wounds clean with soap & water.    You may leave closed incisions open to air if it is dry.   You may cover the incision with clean gauze & replace it after  your daily shower for comfort.  STERISTRIPS:  You have skin tapes called Steristrips on your incisions.  Leave them in place.  You may trim any edges that curl up with clean scissors.  You may remove them in the shower in 3 days.= 12/28 Thursday    ACTIVITIES as tolerated:   You may resume regular (light) daily activities beginning the next day--such as daily self-care, walking, climbing stairs--gradually increasing activities as tolerated.  Control your pain so that you can walk an hour a day.  If you can walk 30 minutes without difficulty, it is safe to try more intense activity such as jogging, treadmill, bicycling, low-impact aerobics, swimming, etc. Save the most intensive and strenuous activity for last such as sit-ups, heavy lifting, contact sports, etc  Refrain from any heavy lifting or straining until you are off narcotics for pain control.   DO NOT PUSH THROUGH PAIN.  Let pain be your guide: If it hurts to do something, don't do it.  Pain is your body warning you to avoid that activity for another week until the pain goes down. You may drive when you are no longer taking prescription pain medication, you can comfortably wear a seatbelt, and you can safely maneuver your car and apply brakes. You may have sexual intercourse when it is comfortable.   FOLLOW UP in our office Please call [facility] at [phone] to set up an appointment to see your surgeon in the office for a follow-up appointment approximately 2-3 weeks after your surgery. Make sure that you call for this appointment the day you arrive home to insure a convenient appointment time.  9.  If you have disability of FMLA / Family leave forms, please bring the forms to the office for processing.  (do not give to your surgeon).  WHEN TO CALL us [phone]: Poor pain control Reactions / problems with new medications (rash/itching, nausea, etc)  Fever over 101.5 F (38.5 C) Inability to urinate Nausea and/or vomiting Worsening  swelling or bruising Continued bleeding from incision. Increased pain, redness, or drainage from the incision   The clinic staff is available to answer your questions during regular business hours (8:30am-5pm).  Please don't hesitate to call and ask to speak to one of our nurses for clinical concerns.   If you have a medical emergency, go to the nearest emergency room or call 911.  A surgeon from [facility] is always on call at the hospitals in [facility], [place] [address], [address], [place], [place]  [address] ?  [address] [address], [place], [place]   [address] MAIN: [phone] ? TOLL FREE: [phone] ? FAX: [phone] [email or web address]  #############################  GETTING TO GOOD BOWEL HEALTH.  ######################################################################  EAT Gradually transition to a high fiber diet with a fiber supplement over the next few weeks after discharge.  Start with a pureed / full liquid diet (see below)  WALK Walk an hour a day.  Control your pain to do that.    HAVE A BOWEL MOVEMENT DAILY Keep your bowels  regular to avoid problems.  OK to try a laxative to override constipation.  OK to use an antidairrheal to slow down diarrhea.  Call if not better after 2 tries  CALL IF YOU HAVE PROBLEMS/CONCERNS Call if you are still struggling despite following these instructions. Call if you have concerns not answered by these instructions  ######################################################################   Irregular bowel habits such as constipation and diarrhea can lead to many problems over time.  Having one soft bowel movement a day is the most important way to prevent further problems.  The anorectal canal is designed to handle stretching and feces to safely manage our ability to get rid of solid waste (feces, poop, stool) out of our body.  BUT, hard constipated stools can act like ripping  concrete bricks and diarrhea can be a burning fire to this very sensitive area of our body, causing inflamed hemorrhoids, anal fissures, increasing risk is perirectal abscesses, abdominal pain/bloating, an making irritable bowel worse.      The goal: ONE SOFT BOWEL MOVEMENT A DAY!  To have soft, regular bowel movements:  Drink plenty of fluids, consider 4-6 tall glasses of water a day.   Take plenty of fiber.  Fiber is the undigested part of plant food that passes into the colon, acting s "natures broom" to encourage bowel motility and movement.  Fiber can absorb and hold large amounts of water. This results in a larger, bulkier stool, which is soft and easier to pass. Work gradually over several weeks up to 6 servings a day of fiber (25g a day even more if needed) in the form of: Vegetables -- Root (potatoes, carrots, turnips), leafy green (lettuce, salad greens, celery, spinach), or cooked high residue (cabbage, broccoli, etc) Fruit -- Fresh (unpeeled skin & pulp), Dried (prunes, apricots, cherries, etc ),  or stewed ( applesauce)  Whole grain breads, pasta, etc (whole wheat)  Bran cereals  Bulking Agents -- This type of water-retaining fiber generally is easily obtained each day by one of the following:  Polyethylene Glycol - and "artificial" fiber commonly called Miralax or Glycolax.  It is helpful for people with gassy or bloated feelings with regular fiber.  Start with 1 dose twice a day - increase to 2 doses twice a day as needed  No reading or other relaxing activity while on the toilet. If bowel movements take longer than 5 minutes, you are too constipated AVOID CONSTIPATION.  High fiber and water intake usually takes care of this.  Sometimes a laxative is needed to stimulate more frequent bowel movements, but  Laxatives are not a good long-term solution as it can wear the colon out.  They can help jump-start bowels if constipated, but should be relied on constantly without discussing with  your doctor Osmotics (Milk of Magnesia, Fleets phosphosoda, Magnesium citrate, MiraLax, GoLytely) are safer than  Stimulants (Senokot, Castor Oil, Dulcolax, Ex Lax)    Avoid taking laxatives for more than 7 days in a row.  IF SEVERELY CONSTIPATED, try a Bowel Retraining Program: Do not use laxatives.  Eat a diet high in roughage, such as bran cereals and leafy vegetables.  Drink six (6) ounces of prune or apricot juice each morning.  Eat two (2) large servings of stewed fruit each day.  Take one (1) heaping tablespoon of a psyllium-based bulking agent twice a day. Use sugar-free sweetener when possible to avoid excessive calories.  Eat a normal breakfast.  Set aside 15 minutes after breakfast to sit on the toilet, but  do not strain to have a bowel movement.  If you do not have a bowel movement by the third day, use an enema and repeat the above steps.   CONTROLLING DIARRHEA  TAKE A FIBER SUPPLEMENT (FiberCon or Benefiner soluble fiber) twice a day - to thicken stools by absorbing excess fluid and retrain the intestines to act more normally.  Slowly increase the dose over a few weeks.  Too much fiber too soon can backfire and cause cramping & bloating.  TAKE AN IRON SUPPLEMENT twice a day to naturally constipate your bowels.  Usually ferrous sulfate 325mg  twice a day)  TAKE ANTI-DIARRHEAL MEDICINES: Loperamide (Imodium) can slow down diarrhea.  Start with two tablets (= 4mg ) first and then try one tablet every 6 hours.  Can go up to 2 pills four times day (8 pills of 2mg  max) Avoid if you are having fevers or severe pain.  If you are not better or start feeling worse, stop all medicines and call your doctor for advice LoMotil (Diphenoxylate / Atropine) is another medicine that can constipate & slow down bowel moevements Pepto Bismol (bismuth) can gently thicken bowels as well  If diarrhea is worse,: drink plenty of liquids and try simpler foods for a few days to avoid stressing your  intestines further. Avoid dairy products (especially milk & ice cream) for a short time.  The intestines often can lose the ability to digest lactose when stressed. Avoid foods that cause gassiness or bloating.  Typical foods include beans and other legumes, cabbage, broccoli, and dairy foods.  Every person has some sensitivity to other foods, so listen to our body and avoid those foods that trigger problems for you.Call your doctor if you are getting worse or not better.  Sometimes further testing (cultures, endoscopy, X-ray studies, bloodwork, etc) may be needed to help diagnose and treat the cause of the diarrhea. Take extra anti-diarrheal medicines (maximum is 8 pills of 2mg  loperamide a day)  TROUBLESHOOTING IRREGULAR BOWELS 1) Avoid extremes of bowel movements (no bad constipation/diarrhea) 2) Miralax 17gm mixed in 8oz. water or juice-daily. May use BID as needed.  3) Gas-x,Phazyme, etc. as needed for gas & bloating.  4) Soft,bland diet. No spicy,greasy,fried foods.  5) Prilosec over-the-counter as needed  6) May hold gluten/wheat products from diet to see if symptoms improve.  7)  May try probiotics (Align, Activa, etc) to help calm the bowels down 7) If symptoms become worse call back immediately.

## 2022-08-20 NOTE — Transfer of Care (Signed)
Immediate Anesthesia Transfer of Care Note  Patient: [name]  Procedure(s) Performed: HERNIA REPAIR INGUINAL ADULT (Left: Inguinal)  Patient Location: PACU  Anesthesia Type:GA combined with regional for post-op pain  Level of Consciousness: awake, alert , oriented, and patient cooperative  Airway & Oxygen Therapy: Patient Spontanous Breathing  Post-op Assessment: Report given to RN and Post -op Vital signs reviewed and stable  Post vital signs: Reviewed and stable  Last Vitals:  Vitals Value Taken Time  BP 141/98 08/20/22 1720  Temp    Pulse 75 08/20/22 1721  Resp 19 08/20/22 1721  SpO2 98 % 08/20/22 1721  Vitals shown include unvalidated device data.  Last Pain:  Vitals:   08/20/22 1519  TempSrc: Oral  PainSc: 0-No pain      Patients Stated Pain Goal: 0 (08/20/22 1116)  Complications: No notable events documented.

## 2022-08-20 NOTE — ED Provider Notes (Signed)
[facility] Emergency Department Provider Note MRN:  [id number]  Arrival date & time: 08/20/22     Chief Complaint   Abdominal pain History of Present Illness   Brad [name] is a 66 y.o. year-old male with a history of hypertension, hernia repair presenting to the ED with chief complaint of abdominal pain.  Left lower quadrant and left inguinal pain with radiation into the left testicle.  Pain is severe and starting suddenly about 1-1/2 hours ago.  No fever, no nausea vomiting or diarrhea.  Review of Systems  A thorough review of systems was obtained and all systems are negative except as noted in the HPI and PMH.   Patient's Health History    Past Medical History:  Diagnosis Date   Allergy    Anxiety    Depression    DJD (degenerative joint disease), cervical    postiton with pillow under knees, cant turn neck    Dysentery, amebic, acute 1981   GERD (gastroesophageal reflux disease)    H/O bronchitis    H/O malaria 1984   Hearing loss    bilateral    Hypertension    labile Blood pressure   Inguinal hernia    Insomnia    early morning awakening   MVP (mitral valve prolapse)    "no problems"   Perianal pain    Personal history of colonic polyps    Tinnitus    right ear    Past Surgical History:  Procedure Laterality Date   CARPAL TUNNEL RELEASE  10/06, 5/10   right wrist    CARPAL TUNNEL RELEASE  3/10   left wrist    cervical spine discectomy   09/2005   COLONOSCOPY WITH PROPOFOL N/A 10/21/2015   Procedure: COLONOSCOPY WITH PROPOFOL;  Surgeon: Charolett Bumpers, MD;  Location: [facility];  Service: Endoscopy;  Laterality: N/A;   INGUINAL HERNIA REPAIR  08/16/2011   Procedure: HERNIA REPAIR INGUINAL ADULT;  Surgeon: Valarie Merino, MD;  Location: [facility];  Service: General;  Laterality: Left;   INGUINAL HERNIA REPAIR Right 05/03/2014   Procedure: OPEN RIGHT INGUINAL HERNIA REPAIR WITH MESH;  Surgeon: Wenda Low, MD;   Location: [facility];  Service: General;  Laterality: Right;   INSERTION OF MESH Right 05/03/2014   Procedure: INSERTION OF MESH;  Surgeon: Wenda Low, MD;  Location: [facility];  Service: General;  Laterality: Right;   TONSILLECTOMY  1960    Family History  Problem Relation Age of Onset   Cancer Mother        breast   Intracerebral hemorrhage Father     Social History   Socioeconomic History   Marital status: Married    Spouse name: Not on file   Number of children: 2   Years of education: Not on file   Highest education level: Professional school degree (e.g., MD, DDS, DVM, JD)  Occupational History   Occupation: psychologist  Tobacco Use   Smoking status: Never   Smokeless tobacco: Never  Vaping Use   Vaping Use: Never used  Substance and Sexual Activity   Alcohol use: Yes    Comment: 2 wine daily   Drug use: Yes    Types: Marijuana    Comment: weekend use   Sexual activity: Yes  Other Topics Concern   Not on file  Social History Narrative   Lives with wife and son in a 3 story home.  His daughter passed away from drug overdose.  He is  a self employed Counsellor.     Social Determinants of Health   Financial Resource Strain: Not on file  Food Insecurity: No Food Insecurity (08/20/2022)   Hunger Vital Sign    Worried About Running Out of Food in the Last Year: Never true    Ran Out of Food in the Last Year: Never true  Transportation Needs: No Transportation Needs (08/20/2022)   PRAPARE - Administrator, Civil Service (Medical): No    Lack of Transportation (Non-Medical): No  Physical Activity: Not on file  Stress: Not on file  Social Connections: Not on file  Intimate Partner Violence: Not At Risk (08/20/2022)   Humiliation, Afraid, Rape, and Kick questionnaire    Fear of Current or Ex-Partner: No    Emotionally Abused: No    Physically Abused: No    Sexually Abused: No     Physical Exam   Vitals:   08/20/22 0354 08/20/22 0505  BP:  (!)  143/98  Pulse:  63  Resp:  16  Temp: 98.8 F (37.1 C) 98.6 F (37 C)  SpO2:  100%    CONSTITUTIONAL: Well-appearing, in moderate distress due to pain NEURO/PSYCH:  Alert and oriented x 3, no focal deficits EYES:  eyes equal and reactive ENT/NECK:  no LAD, no JVD CARDIO: Regular rate, well-perfused, normal S1 and S2 PULM:  CTAB no wheezing or rhonchi GI/GU:  non-distended, moderate to severe left lower quadrant tenderness to palpation MSK/SPINE:  No gross deformities, no edema SKIN:  no rash, atraumatic   *Additional and/or pertinent findings included in MDM below  Diagnostic and Interventional Summary    EKG Interpretation  Date/Time:    Ventricular Rate:    PR Interval:    QRS Duration:   QT Interval:    QTC Calculation:   R Axis:     Text Interpretation:         Labs Reviewed  CBC - Abnormal; Notable for the following components:      Result Value   RBC 4.02 (*)    Hemoglobin 12.6 (*)    HCT 36.6 (*)    All other components within normal limits  COMPREHENSIVE METABOLIC PANEL - Abnormal; Notable for the following components:   Sodium 131 (*)    CO2 20 (*)    Glucose, Bld 100 (*)    All other components within normal limits  URINALYSIS, ROUTINE W REFLEX MICROSCOPIC - Abnormal; Notable for the following components:   Color, Urine COLORLESS (*)    All other components within normal limits  SURGICAL PCR SCREEN  LIPASE, BLOOD  PROTIME-INR  LACTIC ACID, PLASMA  HIV ANTIBODY (ROUTINE TESTING W REFLEX)    CT ABDOMEN PELVIS W CONTRAST  Final Result      Medications  sodium chloride (PF) 0.9 % injection (  Not Given 08/20/22 0306)  enoxaparin (LOVENOX) injection 40 mg (has no administration in time range)  0.9 % NaCl with KCl 20 mEq/ L  infusion ( Intravenous New Bag/Given 08/20/22 0528)  HYDROmorphone (DILAUDID) injection 1 mg (1 mg Intravenous Given 08/20/22 0528)  ondansetron (ZOFRAN-ODT) disintegrating tablet 4 mg (has no administration in time range)     Or  ondansetron (ZOFRAN) injection 4 mg (has no administration in time range)  mupirocin ointment (BACTROBAN) 2 % 1 Application (has no administration in time range)  HYDROmorphone (DILAUDID) injection 1 mg (1 mg Intravenous Given 08/20/22 0113)  sodium chloride 0.9 % bolus 1,000 mL (1,000 mLs Intravenous New Bag/Given 08/20/22 0306)  ondansetron [facility]) injection 4 mg (4 mg Intravenous Given 08/20/22 0112)  iohexol (OMNIPAQUE) 300 MG/ML solution 100 mL (100 mLs Intravenous Contrast Given 08/20/22 0234)  HYDROmorphone (DILAUDID) injection 1 mg (1 mg Intravenous Given 08/20/22 0422)     Procedures  /  Critical Care .Critical Care  Performed by: Sabas Sous, MD Authorized by: Sabas Sous, MD   Critical care provider statement:    Critical care time (minutes):  35   Critical care was necessary to treat or prevent imminent or life-threatening deterioration of the following conditions: Concern for possible incarcerated hernia.   Critical care was time spent personally by me on the following activities:  Development of treatment plan with patient or surrogate, discussions with consultants, evaluation of patient's response to treatment, examination of patient, ordering and review of laboratory studies, ordering and review of radiographic studies, ordering and performing treatments and interventions, pulse oximetry, re-evaluation of patient's condition and review of old charts   ED Course and Medical Decision Making  Initial Impression and Ddx Concern for incarcerated hernia given patient's history though I do not palpate a hernia and so other considerations include diverticulitis, colonic perforation, kidney stone, volvulus  Past medical/surgical history that increases complexity of ED encounter: Hernia  Interpretation of Diagnostics I personally reviewed the laboratory assessment and my interpretation is as follows: No significant blood count or electrolyte disturbance  CT revealing  small hernia containing knuckle of intestines.  Patient Reassessment and Ultimate Disposition/Management     Case discussed with general surgery given the possibility of incarcerated hernia and patient's continued pain, general surgery to admit.  Patient management required discussion with the following services or consulting groups:  General/Trauma Surgery  Complexity of Problems Addressed Acute illness or injury that poses threat of life of bodily function  Additional Data Reviewed and Analyzed Further history obtained from: Prior labs/imaging results  Additional Factors Impacting ED Encounter Risk Use of parenteral controlled substances and Consideration of hospitalization  Elmer Sow. Pilar Plate, MD [facility] Emergency Medicine [facility] [email or web address]  Final Clinical Impressions(s) / ED Diagnoses     ICD-10-CM   1. LLQ pain  R10.32       ED Discharge Orders     None        Discharge Instructions Discussed with and Provided to Patient:   Discharge Instructions   None      Sabas Sous, MD 08/20/22 [phone]

## 2022-08-21 ENCOUNTER — Observation Stay (HOSPITAL_COMMUNITY): Payer: Medicare Other

## 2022-08-21 ENCOUNTER — Encounter (HOSPITAL_COMMUNITY): Payer: Self-pay | Admitting: Surgery

## 2022-08-21 DIAGNOSIS — G8929 Other chronic pain: Secondary | ICD-10-CM | POA: Diagnosis present

## 2022-08-21 DIAGNOSIS — F32A Depression, unspecified: Secondary | ICD-10-CM | POA: Diagnosis present

## 2022-08-21 DIAGNOSIS — F411 Generalized anxiety disorder: Secondary | ICD-10-CM | POA: Diagnosis present

## 2022-08-21 DIAGNOSIS — Z6820 Body mass index (BMI) 20.0-20.9, adult: Secondary | ICD-10-CM | POA: Diagnosis not present

## 2022-08-21 DIAGNOSIS — K5909 Other constipation: Secondary | ICD-10-CM | POA: Diagnosis present

## 2022-08-21 DIAGNOSIS — M1990 Unspecified osteoarthritis, unspecified site: Secondary | ICD-10-CM | POA: Diagnosis present

## 2022-08-21 DIAGNOSIS — F329 Major depressive disorder, single episode, unspecified: Secondary | ICD-10-CM | POA: Diagnosis present

## 2022-08-21 DIAGNOSIS — R634 Abnormal weight loss: Secondary | ICD-10-CM | POA: Diagnosis present

## 2022-08-21 DIAGNOSIS — K4091 Unilateral inguinal hernia, without obstruction or gangrene, recurrent: Secondary | ICD-10-CM | POA: Diagnosis present

## 2022-08-21 DIAGNOSIS — K219 Gastro-esophageal reflux disease without esophagitis: Secondary | ICD-10-CM | POA: Diagnosis present

## 2022-08-21 DIAGNOSIS — I10 Essential (primary) hypertension: Secondary | ICD-10-CM | POA: Diagnosis present

## 2022-08-21 DIAGNOSIS — R1032 Left lower quadrant pain: Secondary | ICD-10-CM | POA: Diagnosis present

## 2022-08-21 DIAGNOSIS — Z803 Family history of malignant neoplasm of breast: Secondary | ICD-10-CM | POA: Diagnosis not present

## 2022-08-21 DIAGNOSIS — Z888 Allergy status to other drugs, medicaments and biological substances status: Secondary | ICD-10-CM | POA: Diagnosis not present

## 2022-08-21 DIAGNOSIS — Z86010 Personal history of colonic polyps: Secondary | ICD-10-CM | POA: Diagnosis not present

## 2022-08-21 MED ORDER — MENTHOL 3 MG MT LOZG
1.0000 | LOZENGE | OROMUCOSAL | Status: DC | PRN
  Administered 2022-08-21: 3 mg via ORAL
  Filled 2022-08-21: qty 9

## 2022-08-21 MED ORDER — DOCUSATE SODIUM 100 MG PO CAPS
100.0000 mg | ORAL_CAPSULE | Freq: Two times a day (BID) | ORAL | Status: DC
  Administered 2022-08-21 – 2022-08-23 (×5): 100 mg via ORAL
  Filled 2022-08-21 (×5): qty 1

## 2022-08-21 MED ORDER — BISACODYL 10 MG RE SUPP
10.0000 mg | Freq: Once | RECTAL | Status: AC
  Administered 2022-08-21: 10 mg via RECTAL
  Filled 2022-08-21: qty 1

## 2022-08-21 MED ORDER — GADOBUTROL 1 MMOL/ML IV SOLN
6.0000 mL | Freq: Once | INTRAVENOUS | Status: AC | PRN
  Administered 2022-08-21: 6 mL via INTRAVENOUS

## 2022-08-21 MED ORDER — SORBITOL 70 % SOLN
960.0000 mL | TOPICAL_OIL | Freq: Once | ORAL | Status: AC | PRN
  Administered 2022-08-21: 960 mL via RECTAL
  Filled 2022-08-21: qty 240

## 2022-08-21 NOTE — Evaluation (Signed)
Physical Therapy Evaluation Patient Details Name: Brad [name] MRN: [id number] DOB: 20-Jun-1956 Today's Date: 08/21/2022  History of Present Illness  Pt s/p L inguinal hernia repair and with hx o fDJD, MVP, Tinnitus, and prior bilat inguinal hernia repair.  Clinical Impression  Pt admitted as above and presenting with functional mobility limitations 2* post op pain and mild ambulatory balance deficits.  Pt should progress to regain full independence in mobility in short order.     Recommendations for follow up therapy are one component of a multi-disciplinary discharge planning process, led by the attending physician.  Recommendations may be updated based on patient status, additional functional criteria and insurance authorization.  Follow Up Recommendations No PT follow up      Assistance Recommended at Discharge PRN  Patient can return home with the following  Assistance with cooking/housework;Assist for transportation;Help with stairs or ramp for entrance    Equipment Recommendations None recommended by PT  Recommendations for Other Services       Functional Status Assessment Patient has had a recent decline in their functional status and demonstrates the ability to make significant improvements in function in a reasonable and predictable amount of time.     Precautions / Restrictions Precautions Precautions: Fall Precaution Comments: Pt educated on log roll to enter/exit bed to reduce stress on surgical site Restrictions Weight Bearing Restrictions: No      Mobility  Bed Mobility Overal bed mobility: Modified Independent, Independent Bed Mobility: Rolling, Sidelying to Sit, Sit to Sidelying Rolling: Min guard, Supervision Sidelying to sit: Min guard, Supervision     Sit to sidelying: Min guard, Supervision General bed mobility comments: Pt observed moving in bed to sit upright and twisting around.  Pt educated on log rolling in/out of bed to decrease stress on  abdominal surgical site    Transfers Overall transfer level: Needs assistance Equipment used: None Transfers: Sit to/from Stand Sit to Stand: Min guard, Supervision           General transfer comment: steady assist only    Ambulation/Gait Ambulation/Gait assistance: Min guard Gait Distance (Feet): 300 Feet Assistive device: IV Pole Gait Pattern/deviations: Step-through pattern, Decreased step length - right, Decreased step length - left, Shuffle, Trunk flexed Gait velocity: decreased     General Gait Details: intermittent steady assist with one episode of self-corrected buckling;  Pt utilizing IV pole for stability  Stairs            Wheelchair Mobility    Modified Rankin (Stroke Patients Only)       Balance Overall balance assessment: Needs assistance Sitting-balance support: No upper extremity supported, Feet supported Sitting balance-Leahy Scale: Good     Standing balance support: No upper extremity supported Standing balance-Leahy Scale: Fair                               Pertinent Vitals/Pain Pain Assessment Pain Assessment: 0-10 Pain Score: 6  Pain Location: surgical site Pain Descriptors / Indicators: Grimacing, Sore Pain Intervention(s): Limited activity within patient's tolerance, Monitored during session, Patient requesting pain meds-RN notified, RN gave pain meds during session    Home Living Family/patient expects to be discharged to:: Private residence Living Arrangements: Alone (wife in [place] for the winter) Available Help at Discharge: Other (Comment) (pt states he has no assist at home) Type of Home: House Home Access: Stairs to enter Entrance Stairs-Rails: Right (pt states rail is unsteady) Entrance Stairs-Number of Steps:  5   Home Layout: One level Home Equipment: None      Prior Function Prior Level of Function : Independent/Modified Independent                     Hand Dominance         Extremity/Trunk Assessment   Upper Extremity Assessment Upper Extremity Assessment: Overall WFL for tasks assessed    Lower Extremity Assessment Lower Extremity Assessment: Overall WFL for tasks assessed    Cervical / Trunk Assessment Cervical / Trunk Assessment: Normal  Communication   Communication: No difficulties  Cognition Arousal/Alertness: Awake/alert Behavior During Therapy: WFL for tasks assessed/performed, Anxious, Impulsive Overall Cognitive Status: Within Functional Limits for tasks assessed                                          General Comments      Exercises     Assessment/Plan    PT Assessment Patient needs continued PT services  PT Problem List Decreased activity tolerance;Decreased balance;Decreased mobility;Pain       PT Treatment Interventions DME instruction;Gait training;Stair training;Functional mobility training;Therapeutic activities;Therapeutic exercise;Patient/family education    PT Goals (Current goals can be found in the Care Plan section)  Acute Rehab PT Goals Patient Stated Goal: Decreased pain, regain IND PT Goal Formulation: With patient Time For Goal Achievement: 09/03/22 Potential to Achieve Goals: Good    Frequency Min 3X/week     Co-evaluation               AM-PAC PT "6 Clicks" Mobility  Outcome Measure Help needed turning from your back to your side while in a flat bed without using bedrails?: A Little Help needed moving from lying on your back to sitting on the side of a flat bed without using bedrails?: A Little Help needed moving to and from a bed to a chair (including a wheelchair)?: A Little Help needed standing up from a chair using your arms (e.g., wheelchair or bedside chair)?: A Little Help needed to walk in hospital room?: A Little Help needed climbing 3-5 steps with a railing? : A Little 6 Click Score: 18    End of Session Equipment Utilized During Treatment: Gait belt Activity  Tolerance: Patient tolerated treatment well Patient left: in bed;with call bell/phone within reach;with nursing/sitter in room Nurse Communication: Mobility status PT Visit Diagnosis: Unsteadiness on feet (R26.81);Pain    Time: 1530-1546 PT Time Calculation (min) (ACUTE ONLY): 16 min   Charges:   PT Evaluation $PT Eval Low Complexity: 1 Low          [place] Pager [phone] Office [phone]   Shannon Kirkendall 08/21/2022, 4:59 PM

## 2022-08-21 NOTE — Plan of Care (Signed)

## 2022-08-21 NOTE — Plan of Care (Signed)
  Problem: Coping: Goal: Level of anxiety will decrease Outcome: Progressing   Problem: Pain Managment: Goal: General experience of comfort will improve Outcome: Progressing   Problem: Safety: Goal: Ability to remain free from injury will improve Outcome: Progressing   

## 2022-08-21 NOTE — Progress Notes (Addendum)
1 Day Post-Op   Subjective/Chief Complaint: He is up brushing his teeth. Reports he is very cold, did not sleep because he inhaled some of the bactroban they put on his nares. Pain seems well controlled.  Complains of bilateral leg pain, which predates surgery. Very concerned about being home alone- no food I the house, his wife is in [place] until march, feels he would be unable to drive to get groceries or go out toeat. Also concerned about constipation (no BM in 6 days) and weight loss of 20lb since October. He had a colonoscopy a year ago which he reports was clear, can't remember last endoscopy. States he saw a GI PA and was prescribed linzess.    Objective: Vital signs in last 24 hours: Temp:  [98.1 F (36.7 C)-98.9 F (37.2 C)] 98.4 F (36.9 C) (12/23 0143) Pulse Rate:  [60-83] 83 (12/23 0143) Resp:  [16-23] 16 (12/23 0143) BP: (114-160)/(78-98) 114/79 (12/23 0143) SpO2:  [97 %-100 %] 97 % (12/23 0143) Last BM Date : 08/15/22  Intake/Output from previous day: 12/22 0701 - 12/23 0700 In: 3342.5 [P.O.:360; I.V.:2982.5] Out: 1760 [Urine:1750; Blood:10] Intake/Output this shift: No intake/output data recorded.  General appearance: alert and cooperative Resp: unlabored Cardio: regular rate and rhythm GI: soft, non-tender; bowel sounds normal; no masses,  no organomegaly and dressing in left groin c/d/I, no significant edema/ecchymosis or seroma/hematoma. Appropriately tender. Hernia repair intact. Extremities: extremities normal, atraumatic, no cyanosis or edema Skin: Skin color, texture, turgor normal. No rashes or lesions  Lab Results:  Recent Labs    08/20/22 0104  WBC 6.9  HGB 12.6*  HCT 36.6*  PLT 169   BMET Recent Labs    08/20/22 0104  NA 131*  K 3.5  CL 101  CO2 20*  GLUCOSE 100*  BUN 17  CREATININE 0.97  CALCIUM 9.1   PT/INR Recent Labs    08/20/22 0104  LABPROT 13.6  INR 1.1   ABG No results for input(s): "PHART", "HCO3" in the last 72  hours.  Invalid input(s): "PCO2", "PO2"  Studies/Results: CT ABDOMEN PELVIS W CONTRAST  Result Date: 08/20/2022 CLINICAL DATA:  Abdominal pain, known hernia EXAM: CT ABDOMEN AND PELVIS WITH CONTRAST TECHNIQUE: Multidetector CT imaging of the abdomen and pelvis was performed using the standard protocol following bolus administration of intravenous contrast. RADIATION DOSE REDUCTION: This exam was performed according to the departmental dose-optimization program which includes automated exposure control, adjustment of the mA and/or kV according to patient size and/or use of iterative reconstruction technique. CONTRAST:  OMNIPAQUE IOHEXOL 300 MG/ML  SOLN COMPARISON:  07/13/2022 FINDINGS: Lower chest: Lung bases are clear. Hepatobiliary: Small hepatic cysts, unchanged from prior, benign benign. Gallbladder is unremarkable. No intrahepatic or extrahepatic duct dilatation. Pancreas: Subtle hypoenhancement along the pancreatic uncinate process (series 2/image 28), measuring 2.6 x 1.8 cm. This is equivocal but warrants additional evaluation to exclude a true pancreatic lesion in this location. No pancreatic atrophy or ductal effusion. Spleen: Within normal limits. Adrenals/Urinary Tract: Adrenal glands are within normal limits. Kidneys are within normal limits.  No hydronephrosis. Bladder is within normal limits. Stomach/Bowel: Stomach is within normal limits. No evidence of bowel obstruction. Appendix is not discretely visualized. Mild sigmoid diverticulosis, without evidence of diverticulitis. Vascular/Lymphatic: No evidence of abdominal aortic aneurysm. Atherosclerotic calcifications of the abdominal aorta and branch vessels. No suspicious abdominopelvic lymphadenopathy. Reproductive: Prostate is unremarkable. Other: No abdominopelvic ascites. Tiny left inguinal hernia containing a knuckle of nondilated small bowel (series 2/image 61). Musculoskeletal: Degenerative  changes of the visualized thoracolumbar  spine. IMPRESSION: Tiny left inguinal hernia containing a knuckle of nondilated small bowel. No evidence of bowel obstruction. Subtle hypoenhancement along the pancreatic uncinate process, measuring 2.6 x 1.8 cm. This is equivocal but warrants additional evaluation to exclude a true pancreatic lesion in this location. MRI abdomen with/without contrast is suggested for further evaluation. Electronically Signed   By: Charline Bills M.D.   On: 08/20/2022 02:56    Anti-infectives: Anti-infectives (From admission, onward)    Start     Dose/Rate Route Frequency Ordered Stop   08/20/22 1400  ceFAZolin (ANCEF) IVPB 2g/100 mL premix        2 g 200 mL/hr over 30 Minutes Intravenous On call to O.R. 08/20/22 1250 08/20/22 1631       Assessment/Plan: Incarcerated nonobstructing left inguinal hernia Constipation Patient seen examined and relevant labs and imaging reviewed.  Workup consistent with incarcerated but nonobstructing left inguinal hernia in the setting of significant constipation.  He has a history of prior open inguinal hernia repair with mesh.  s/p open repair of recurrent L inguinal hernia Dr Corliss Skains 12/22. In setting of ongoing constipation he has a high risk of recurrence, recommend he continue to follow-up with PCP and GI for constipation management and prevention. Suppository, possible enema today.   FEN: NPO ID: ancef periop VTE: LMWH Dispo- patient has concerns. See SW note. Will consult PT as well.    Incidental finding of hypoenhancement of pancrease on CT - will proceed with MRI abdomen with and without contrast/ pancreas protocol. Given reported 20lb weight loss and poor appetite (which may be driving his constipation instead of vice versa).  HTN - home meds  Anxiety/depression - home meds DJD  LOS: 0 days    Berna Bue 08/21/2022

## 2022-08-22 MED ORDER — HYDROMORPHONE HCL 1 MG/ML IJ SOLN: 0.5000 mg | INTRAMUSCULAR | Status: DC | PRN

## 2022-08-22 MED ORDER — SIMETHICONE 80 MG PO CHEW
80.0000 mg | CHEWABLE_TABLET | Freq: Four times a day (QID) | ORAL | Status: DC | PRN
  Administered 2022-08-22 (×2): 80 mg via ORAL
  Filled 2022-08-22 (×2): qty 1

## 2022-08-22 MED ORDER — OXYCODONE HCL 5 MG PO TABS
5.0000 mg | ORAL_TABLET | Freq: Four times a day (QID) | ORAL | Status: DC | PRN
  Administered 2022-08-22 – 2022-08-23 (×2): 5 mg via ORAL
  Filled 2022-08-22 (×2): qty 1

## 2022-08-22 MED ORDER — KETOROLAC TROMETHAMINE 15 MG/ML IJ SOLN
15.0000 mg | Freq: Four times a day (QID) | INTRAMUSCULAR | Status: DC | PRN
  Administered 2022-08-22 (×2): 15 mg via INTRAVENOUS
  Filled 2022-08-22 (×2): qty 1

## 2022-08-22 MED ORDER — METHOCARBAMOL 500 MG PO TABS: 500.0000 mg | ORAL_TABLET | Freq: Four times a day (QID) | ORAL | Status: DC | PRN

## 2022-08-22 NOTE — Progress Notes (Signed)
Patient c/o blood in his stool. Showed nurse a wipe with a tint of blood noted. C/o abdominal pain, bloated. He said he profoundly bleeding inside. Notified MD via secure chat.

## 2022-08-22 NOTE — Plan of Care (Signed)
  Problem: Pain Managment: Goal: General experience of comfort will improve Outcome: Progressing   Problem: Safety: Goal: Ability to remain free from injury will improve Outcome: Progressing   Problem: Coping: Goal: Level of anxiety will decrease Outcome: Progressing   

## 2022-08-22 NOTE — Progress Notes (Signed)
   08/22/22 0603  Vitals  Temp 98.2 F (36.8 C)  Temp Source Oral  BP (!) 152/86  MAP (mmHg) 107  BP Location Left Arm  BP Method Automatic  Patient Position (if appropriate) Lying  Pulse Rate 66  Resp 18  Level of Consciousness  Level of Consciousness Alert  MEWS COLOR  MEWS Score Color Green  Oxygen Therapy  SpO2 100 %  O2 Device Room Air  Pain Assessment  Pain Scale 0-10  Pain Score 5  Faces Pain Scale 2  Pain Type Surgical pain  Pain Location Abdomen  Pain Orientation Left  Pain Radiating Towards groin  Pain Descriptors / Indicators Aching  Pain Frequency Constant  Pain Onset With Activity  Patients Stated Pain Goal 4  Pain Intervention(s) Refused (offered hot pack, ice, medication, ambulation.)  Multiple Pain Sites No  PAINAD (Pain Assessment in Advanced Dementia)  Breathing 0  Negative Vocalization 0  Facial Expression 1  Body Language 1 (seems to be baseline)  Consolability 0  PAINAD Score 2  POSS Scale (Pasero Opioid Sedation Scale)  POSS *See Group Information* 1-Acceptable,Awake and alert  Complaints & Interventions  Complains of Other (Comment) (Trying to convince Writer that he is bleeding internally by stating that he had blood on the toilet paper.  Writer examined the paper and could not see any frank blood.)  Interventions Patient refused medication;Patient refused interventions;Other (comment) Hydrographic surveyor has offered to call the On-Call provider of [facility] for Patient complaint of a bruise on his penis)  Patients response to intervention Unchanged  Hot/Cold Interventions  Hot/Cold Interventions Ice Pack;Hot Pack (Refused, but acknowledges that the ice pack helps.)  PCA/Epidural/Spinal Assessment  IS - Achieved (mL) (RN, NT, or RT) 2500 mL (refuses to use more than once.)  Respiratory Pattern Regular;Unlabored  MEWS Score  MEWS Temp 0  MEWS Systolic 0  MEWS Pulse 0  MEWS RR 0  MEWS LOC 0  MEWS Score 0   Patient reported that prior nurse had to  read the directions on how to give an enema and did not know to position him on his left side, that it was very painful from the pressure.   Patient accused this Clinical research associate of being adversarial and combative with him the entire night.  Stating "I am convinced that I have internal bleeding.  I have had blood in my stool and when I wipe with the paper."   Patient does have a bruise on his penis that has not changed significantly in size or color this shift.  Patient is unable to show blood on tissue and refused to acknowledge that the bed pad did not have any blood on it.  Toilet did not have any stool, and used paper did not have any blood.  Instructed to use the provided sample container to save any tissue with blood. Patient expressed concern for not being believed or supported in his belief.  Informed Patient that every option has been available for pain management and contacting On-Call.  Informed Patient that his vital signs are normal, his mobility is very good, and that his pain and swelling are also normal for the procedure and that it is appropriate to report the bruise on his penis when the Attending rounds in the morning. Patient ambulated two laps of unit twice this shift with Tylenol ice and hot pack for pain management with stated pain level 5/10 refusing additional medications.  Patient also slept 6-7 hours this shift. END

## 2022-08-22 NOTE — Progress Notes (Signed)
Patient's bathroom call light on. Nurse to room patient states he has lot of blood in his stool. Nurse checked his commode. There's a medium loose brown/ yellow mixed with urine  stools noted.  Patient states he needs a lot of care. Complains he had too much enema, feel bloated.  Encourage ambulating.

## 2022-08-22 NOTE — Progress Notes (Signed)
2 Days Post-Op   Subjective/Chief Complaint: He reports that after the enema he started to feel distended.  Describes diffuse abdominal pain and persistent distention this morning.  Reports that the enema and some flecks of stool evacuated but he does not feel that he had much in the way of bowel movement after this.  This was 3 or 4 in the afternoon yesterday.  Denies nausea or emesis.  Concerned about bruising at the base of his penis.  Has many concerns again today.   Objective: Vital signs in last 24 hours: Temp:  [98.2 F (36.8 C)-98.7 F (37.1 C)] 98.2 F (36.8 C) (12/24 0603) Pulse Rate:  [66-88] 66 (12/24 0603) Resp:  [18] 18 (12/24 0603) BP: (92-152)/(73-86) 152/86 (12/24 0603) SpO2:  [100 %] 100 % (12/24 0603) Last BM Date : 08/21/22  Intake/Output from previous day: 12/23 0701 - 12/24 0700 In: 3100.3 [P.O.:840; I.V.:2260.3] Out: 300 [Urine:300] Intake/Output this shift: No intake/output data recorded.  General appearance: alert and cooperative.  He is easily able to stand up from the bedside and remain standing with no apparent balance issues or weakness. Resp: unlabored Cardio: regular rate and rhythm GI: soft, subjectively diffusely tender without guarding, mildly distended.  OR dressing remains dry and intact with very small amount of blood staining at the medial aspect.  Expected edema in the left groin with evolving ecchymosis along the base of the penis not surprising.  No significant hematoma or seroma; hernia repair is intact. Extremities: extremities normal, atraumatic, no cyanosis or edema Skin: Skin color, texture, turgor normal. No rashes or lesions  Lab Results:  Recent Labs    08/20/22 0104  WBC 6.9  HGB 12.6*  HCT 36.6*  PLT 169    BMET Recent Labs    08/20/22 0104  NA 131*  K 3.5  CL 101  CO2 20*  GLUCOSE 100*  BUN 17  CREATININE 0.97  CALCIUM 9.1    PT/INR Recent Labs    08/20/22 0104  LABPROT 13.6  INR 1.1    ABG No results  for input(s): "PHART", "HCO3" in the last 72 hours.  Invalid input(s): "PCO2", "PO2"  Studies/Results: MR ABDOMEN W WO CONTRAST  Result Date: 08/21/2022 CLINICAL DATA:  Possible pancreatic uncinate mass identified by prior CT EXAM: MRI ABDOMEN WITHOUT AND WITH CONTRAST TECHNIQUE: Multiplanar multisequence MR imaging of the abdomen was performed both before and after the administration of intravenous contrast. CONTRAST:  25mL GADAVIST GADOBUTROL 1 MMOL/ML IV SOLN COMPARISON:  CT abdomen pelvis, 08/20/2022 FINDINGS: Lower chest: No acute abnormality.  Cardiomegaly. Hepatobiliary: No solid liver abnormality is seen. No gallstones, gallbladder wall thickening, or biliary dilatation. Pancreas: No mass, signal abnormality, or abnormal contrast enhancement of the medial pancreatic uncinate as queried by prior CT. No pancreatic ductal dilatation or surrounding inflammatory changes. Spleen: Normal in size without significant abnormality. Adrenals/Urinary Tract: Adrenal glands are unremarkable. Kidneys are normal, without renal calculi, solid lesion, or hydronephrosis. Stomach/Bowel: Stomach is within normal limits. No evidence of bowel wall thickening, distention, or inflammatory changes. Vascular/Lymphatic: No significant vascular findings are present. No enlarged abdominal lymph nodes. Other: No abdominal wall hernia or abnormality. No ascites. Musculoskeletal: No acute or significant osseous findings. IMPRESSION: No mass, signal abnormality, or abnormal contrast enhancement of the medial pancreatic uncinate as queried by prior CT. No pancreatic ductal dilatation or surrounding inflammatory changes. Electronically Signed   By: Jearld Lesch M.D.   On: 08/21/2022 15:06   MR 3D Recon At Scanner  Result Date:  08/21/2022 CLINICAL DATA:  Possible pancreatic uncinate mass identified by prior CT EXAM: MRI ABDOMEN WITHOUT AND WITH CONTRAST TECHNIQUE: Multiplanar multisequence MR imaging of the abdomen was performed  both before and after the administration of intravenous contrast. CONTRAST:  47mL GADAVIST GADOBUTROL 1 MMOL/ML IV SOLN COMPARISON:  CT abdomen pelvis, 08/20/2022 FINDINGS: Lower chest: No acute abnormality.  Cardiomegaly. Hepatobiliary: No solid liver abnormality is seen. No gallstones, gallbladder wall thickening, or biliary dilatation. Pancreas: No mass, signal abnormality, or abnormal contrast enhancement of the medial pancreatic uncinate as queried by prior CT. No pancreatic ductal dilatation or surrounding inflammatory changes. Spleen: Normal in size without significant abnormality. Adrenals/Urinary Tract: Adrenal glands are unremarkable. Kidneys are normal, without renal calculi, solid lesion, or hydronephrosis. Stomach/Bowel: Stomach is within normal limits. No evidence of bowel wall thickening, distention, or inflammatory changes. Vascular/Lymphatic: No significant vascular findings are present. No enlarged abdominal lymph nodes. Other: No abdominal wall hernia or abnormality. No ascites. Musculoskeletal: No acute or significant osseous findings. IMPRESSION: No mass, signal abnormality, or abnormal contrast enhancement of the medial pancreatic uncinate as queried by prior CT. No pancreatic ductal dilatation or surrounding inflammatory changes. Electronically Signed   By: Jearld Lesch M.D.   On: 08/21/2022 15:06    Anti-infectives: Anti-infectives (From admission, onward)    Start     Dose/Rate Route Frequency Ordered Stop   08/20/22 1400  ceFAZolin (ANCEF) IVPB 2g/100 mL premix        2 g 200 mL/hr over 30 Minutes Intravenous On call to O.R. 08/20/22 1250 08/20/22 1631       Assessment/Plan: Incarcerated nonobstructing left inguinal hernia Constipation Patient seen examined and relevant labs and imaging reviewed.  Workup consistent with incarcerated but nonobstructing left inguinal hernia in the setting of significant constipation.  He has a history of prior open inguinal hernia repair with  mesh.  s/p open repair of recurrent L inguinal hernia Dr Corliss Skains 12/22. In setting of ongoing constipation he has a high risk of recurrence, recommend he continue to follow-up with PCP and GI for constipation management and prevention.  Complains of bloating today and has several other concerns.  No nausea, fairly benign exam though he is distended.  Encouraged patient to walk, continue to monitor.  Vital signs are reassuring.  Will plan plain films if pain and distention worsening today.   FEN:  ID: ancef periop VTE: LMWH Dispo- patient has concerns. See SW note.  Cleared by PT.   Incidental finding of hypoenhancement of pancrease on CT -MRI pancreas protocol -12/23. Given reported 20lb weight loss and poor appetite he will certainly need outpatient GI follow-up to consider upper and lower endoscopy.Marland Kitchen  HTN - home meds  Anxiety/depression - home meds DJD  LOS: 1 day    Berna Bue 08/22/2022

## 2022-08-22 NOTE — Plan of Care (Signed)

## 2022-08-23 DIAGNOSIS — K5909 Other constipation: Secondary | ICD-10-CM | POA: Diagnosis present

## 2022-08-23 DIAGNOSIS — F411 Generalized anxiety disorder: Secondary | ICD-10-CM | POA: Diagnosis present

## 2022-08-23 LAB — BASIC METABOLIC PANEL
Anion gap: 6 (ref 5–15)
BUN: 13 mg/dL (ref 8–23)
CO2: 24 mmol/L (ref 22–32)
Calcium: 8.8 mg/dL — ABNORMAL LOW (ref 8.9–10.3)
Chloride: 104 mmol/L (ref 98–111)
Creatinine, Ser: 0.76 mg/dL (ref 0.61–1.24)
GFR, Estimated: >60 mL/min (ref >60)
Glucose, Bld: 95 mg/dL (ref 70–99)
Potassium: 3.8 mmol/L (ref 3.5–5.1)
Sodium: 134 mmol/L — ABNORMAL LOW (ref 135–145)

## 2022-08-23 LAB — CBC
HCT: 32.5 % — ABNORMAL LOW (ref 39.0–52.0)
Hemoglobin: 11.1 g/dL — ABNORMAL LOW (ref 13.0–17.0)
MCH: 31.2 pg (ref 26.0–34.0)
MCHC: 34.2 g/dL (ref 30.0–36.0)
MCV: 91.3 fL (ref 80.0–100.0)
Platelets: 163 10*3/uL (ref 150–400)
RBC: 3.56 MIL/uL — ABNORMAL LOW (ref 4.22–5.81)
RDW: 14.1 % (ref 11.5–15.5)
WBC: 7.2 10*3/uL (ref 4.0–10.5)
nRBC: 0 % (ref 0.0–0.2)

## 2022-08-23 LAB — MAGNESIUM: Magnesium: 2.2 mg/dL (ref 1.7–2.4)

## 2022-08-23 MED ORDER — ATORVASTATIN CALCIUM 10 MG PO TABS
10.0000 mg | ORAL_TABLET | ORAL | Status: DC
  Administered 2022-08-23: 10 mg via ORAL
  Filled 2022-08-23: qty 1

## 2022-08-23 MED ORDER — ZINC GLUCONATE 50 MG PO TABS: 50.0000 mg | ORAL_TABLET | Freq: Every day | ORAL | Status: DC

## 2022-08-23 MED ORDER — ZINC SULFATE 220 (50 ZN) MG PO CAPS
220.0000 mg | ORAL_CAPSULE | Freq: Every day | ORAL | Status: DC
  Administered 2022-08-23: 220 mg via ORAL
  Filled 2022-08-23: qty 1

## 2022-08-23 MED ORDER — OXYCODONE HCL 5 MG PO TABS: 5.0000 mg | ORAL_TABLET | Freq: Four times a day (QID) | ORAL | 0 refills | Status: DC | PRN

## 2022-08-23 MED ORDER — ADULT MULTIVITAMIN W/MINERALS CH
1.0000 | ORAL_TABLET | Freq: Every day | ORAL | Status: DC
  Administered 2022-08-23: 1 via ORAL
  Filled 2022-08-23: qty 1

## 2022-08-23 NOTE — Plan of Care (Signed)
Discharge instructions provided to patient. He is awaiting ride from friend via private vehicle. Haydee Salter, RN 08/23/22 12:34 PM

## 2022-08-23 NOTE — Progress Notes (Signed)
PT Cancellation Note  Patient Details Name: Brad [name] MRN: [id number] DOB: March 07, 1956   Cancelled Treatment:     Pt observed ambulating IND in halls.  RN states pt IND in room.  Will dc PT services at this time.   [name] 08/23/2022, 10:15 AM

## 2022-08-23 NOTE — Discharge Summary (Signed)
Physician Discharge Summary    Patient ID: Brad [name] [facility] MRN: [id number] DOB/AGE: October 23, 1955  66 y.o.  Patient Care Team: [facility] as PCP - General Manus Rudd, MD as Consulting Physician (General Surgery)  Admit date: 08/19/2022  Discharge date: 08/23/2022  Hospital Stay = 2 days    Discharge Diagnoses:  Principal Problem:   Incarcerated left inguinal hernia Active Problems:   MDD (major depressive disorder)   Anxiety state   3 Days Post-Op  08/20/2022  POST-OPERATIVE DIAGNOSIS:   Recurrent left inguinal hernia  SURGERY: HERNIA REPAIR INGUINAL ADULT  SURGEON:    Surgeon(s): Manus Rudd, MD  Consults: Anesthesia  Hospital Course:   Patient with chronic pain and constipation noticed worsening groin pain.  Evaluation concerning for probable recurrent left ankle hernia with possible small bowel involvement.  He was taken the operating room by Dr. Rozanna Boer urgently.  Postoperatively, the patient gradually mobilized and advanced to a solid diet.  Pain and other symptoms were treated aggressively.   Patient's had concerned about bruising and swelling.  Some bloating as well but he was tolerating a diet and walking well.  By the time of discharge, the patient was walking well the hallways, eating food, having flatus.  Patient has chronic constipation.  I recommend he increase his MiraLAX to twice a day if not 4 doses a day.  Defer to use of Linzess to his gastroenterologist and primary came team that started him on that.  Pain was well-controlled on an oral medications.  Incision look clean dry and intact.  Had an episode of bumping his head and belly while he was washing.  However no loss of consciousness.  Normal ambulation.  No focal deficits.  Normal neurological examination.  Based on meeting discharge criteria and continuing to recover, I felt it was safe for the patient to be discharged from the hospital to further recover with close  followup. Postoperative recommendations were discussed in detail.  They are written as well.  Discharged Condition: stable  Discharge Exam: Blood pressure 137/88, pulse 65, temperature 98.1 F (36.7 C), resp. rate 17, height  (1.753 m), weight 63.9 kg, SpO2 98 %.  General: Pt awake/alert/oriented x4 in No acute distress.  Walking up around room without any hesitancy or straining. Eyes: PERRL, normal EOM.  Sclera clear.  No icterus Neuro: CN II-XII intact w/o focal sensory/motor deficits.  Walking room without any difficulty.  No resting tremors or agitation. Lymph: No head/neck/groin lymphadenopathy Psych:  No delerium/psychosis/paranoia.  Somewhat anxious and occasionally interrupting but mostly consolable.  No word salad nor flight of ideas. HENT: Normocephalic, Mucus membranes moist.  No thrush Neck: Supple, No tracheal deviation Chest:  No chest wall pain w good excursion CV:  Pulses intact.  Regular rhythm MS: Normal AROM mjr joints.  No obvious deformity Abdomen: Soft.  Nondistended.  Nontender.  No evidence of peritonitis.  No incarcerated hernias. GU: Minimal ecchymosis and groins and scrotum.  Left groin incision clean dry intact with normal healing ridge.  No hernia recurrence. Ext:  SCDs BLE.  No mjr edema.  No cyanosis Skin: No petechiae / purpura   Disposition:    Follow-up Information     [facility], [place]. Call.   Why: ASAP for ongoing management of constipation Contact information: [address] [place] [address] [phone]         [facility] Follow up.   Why: Follow-up ongoing management of constipation Contact information: [address] [place] [address] [phone]         [facility], [place]. Schedule an appointment as soon as possible for a visit in 3 week(s).   Specialty: General Surgery Why: To follow up after your operation, To follow up after your hospital  stay Contact information: [address] [place] [address] [phone]                Discharge disposition: 01-Home or Self Care       Discharge Instructions     Call MD for:   Complete by: As directed    FEVER > 101.5 F  (temperatures < 101.5 F are not significant)   Call MD for:  extreme fatigue   Complete by: As directed    Call MD for:  persistant dizziness or light-headedness   Complete by: As directed    Call MD for:  persistant nausea and vomiting   Complete by: As directed    Call MD for:  redness, tenderness, or signs of infection (pain, swelling, redness, odor or green/yellow discharge around incision site)   Complete by: As directed    Call MD for:  severe uncontrolled pain   Complete by: As directed    Diet - low sodium heart healthy   Complete by: As directed    Start with a bland diet such as soups, liquids, starchy foods, low fat foods, etc. the first few days at home. Gradually advance to a solid, low-fat, high fiber diet by the end of the first week at home.   Add a fiber supplement to your diet (Metamucil, etc) If you feel full, bloated, or constipated, stay on a full liquid or pureed/blenderized diet for a few days until you feel better and are no longer constipated.   Discharge instructions   Complete by: As directed    See Discharge Instructions If you are not getting better after two weeks or are noticing you are getting worse, contact our office [phone] for further advice.  We may need to adjust your medications, re-evaluate you in the office, send you to the emergency room, or see what other things we can do to help. The clinic staff is available to answer your questions during regular business hours (8:30am-5pm).  Please don't hesitate to call and ask to speak to one of our nurses for clinical concerns.    A surgeon from [facility] is always on call at the hospitals 24 hours/day If you  have a medical emergency, go to the nearest emergency room or call 911.   Discharge wound care:   Complete by: As directed    It is good for closed incisions and even open wounds to be washed every day.  Shower every day.  Short baths are fine.  Wash the incisions and wounds clean with soap & water.    You may leave closed incisions open to air if it is dry.   You may cover the incision with clean gauze & replace it after your daily shower for comfort.  STERISTRIPS:  You have skin tapes called Steristrips on your incisions.  Leave them in place, and they will fall off on their own like a scab in 1-3 weeks.  You may trim any edges that curl up with clean scissors.  You may remove them in the shower in a week.   Driving Restrictions   Complete by: As directed    You may  drive when: - you are no longer taking narcotic prescription pain medication - you can comfortably wear a seatbelt - you can safely make sudden turns/stops without pain.   Increase activity slowly   Complete by: As directed    Start light daily activities --- self-care, walking, climbing stairs- beginning the day after surgery.  Gradually increase activities as tolerated.  Control your pain to be active.  Stop when you are tired.  Ideally, walk several times a day, eventually an hour a day.   Most people are back to most day-to-day activities in a few weeks.  It takes 4-6 weeks to get back to unrestricted, intense activity. If you can walk 30 minutes without difficulty, it is safe to try more intense activity such as jogging, treadmill, bicycling, low-impact aerobics, swimming, etc. Save the most intensive and strenuous activity for last (Usually 4-8 weeks after surgery) such as sit-ups, heavy lifting, contact sports, etc.  Refrain from any intense heavy lifting or straining until you are off narcotics for pain control.  You will have off days, but things should improve week-by-week. DO NOT PUSH THROUGH PAIN.  Let pain be your guide:  If it hurts to do something, don't do it.   Lifting restrictions   Complete by: As directed    If you can walk 30 minutes without difficulty, it is safe to try more intense activity such as jogging, treadmill, bicycling, low-impact aerobics, swimming, etc. Save the most intensive and strenuous activity for last (Usually 4-8 weeks after surgery) such as sit-ups, heavy lifting, contact sports, etc.   Refrain from any intense heavy lifting or straining until you are off narcotics for pain control.  You will have off days, but things should improve week-by-week. DO NOT PUSH THROUGH PAIN.  Let pain be your guide: If it hurts to do something, don't do it.  Pain is your body warning you to avoid that activity for another week until the pain goes down.   May shower / Bathe   Complete by: As directed    May walk up steps   Complete by: As directed    Sexual Activity Restrictions   Complete by: As directed    You may have sexual intercourse when it is comfortable. If it hurts to do something, stop.       Allergies as of 08/23/2022       Reactions   Gabapentin Other (See Comments)   Unable to urinate, drowsiness   Trileptal [oxcarbazepine] Other (See Comments)   Bad taste in his mouth        Medication List     STOP taking these medications    oxyCODONE-acetaminophen 5-325 MG tablet Commonly known as: Percocet       TAKE these medications    Alpha-Lipoic Acid 600 MG Caps Take 1 capsule by mouth daily.   amLODipine 5 MG tablet Commonly known as: NORVASC Take 2 tablets (10 mg total) by mouth daily. What changed: how much to take   atorvastatin 10 MG tablet Commonly known as: LIPITOR Take 10 mg by mouth 3 (three) times a week. MWF   BuSpar 10 MG tablet Generic drug: busPIRone Take 20 mg by mouth 3 (three) times daily.   Cerefolin NAC 6-2-600 MG Tabs 1 a day   Coenzyme Q10 50 MG Caps Take 1 capsule by mouth daily.   doxepin 25 MG capsule Commonly known as:  SINEQUAN Take 25 mg by mouth at bedtime.   LORazepam 1 MG tablet Commonly known  as: ATIVAN Take 1-2 mg by mouth 2 (two) times daily. Take 1 tablet in the morning and Take 2 tablets at bedtime   losartan 100 MG tablet Commonly known as: COZAAR Take 100 mg by mouth daily.   memantine 10 MG tablet Commonly known as: Namenda Take 1 tablet (10 mg total) by mouth 2 (two) times daily.   metoprolol tartrate 25 MG tablet Commonly known as: LOPRESSOR Take 25 mg by mouth 2 (two) times daily. Per Pt he "restarted it" as his MD had stopped it this week   mirtazapine 30 MG tablet Commonly known as: REMERON Take 30 mg by mouth at bedtime.   multivitamin tablet Take 1 tablet by mouth daily.   Omega-3 1000 MG Caps Take 1 capsule by mouth daily.   oxyCODONE 5 MG immediate release tablet Commonly known as: Oxy IR/ROXICODONE Take 1 tablet (5 mg total) by mouth every 6 (six) hours as needed for severe pain or breakthrough pain.   pregabalin 75 MG capsule Commonly known as: LYRICA Take 75 mg by mouth 2 (two) times daily.   prenatal multivitamin Tabs tablet Take 1 tablet by mouth daily.   traZODone 50 MG tablet Commonly known as: DESYREL Take 50 mg by mouth at bedtime.   Vitamin D3 125 MCG (5000 UT) Tabs Take 1 tablet by mouth daily.   zinc gluconate 50 MG tablet Take 50 mg by mouth daily.   zolpidem 12.5 MG CR tablet Commonly known as: AMBIEN CR Take 12.5 mg by mouth at bedtime.               Discharge Care Instructions  (From admission, onward)           Start     Ordered   08/23/22 0000  Discharge wound care:       Comments: It is good for closed incisions and even open wounds to be washed every day.  Shower every day.  Short baths are fine.  Wash the incisions and wounds clean with soap & water.    You may leave closed incisions open to air if it is dry.   You may cover the incision with clean gauze & replace it after your daily shower for comfort.  STERISTRIPS:   You have skin tapes called Steristrips on your incisions.  Leave them in place, and they will fall off on their own like a scab in 1-3 weeks.  You may trim any edges that curl up with clean scissors.  You may remove them in the shower in a week.   08/23/22 0750            Significant Diagnostic Studies:  Results for orders placed or performed during the hospital encounter of 08/19/22 (from the past 72 hour(s))  CBC     Status: Abnormal   Collection Time: 08/23/22  4:14 AM  Result Value Ref Range   WBC 7.2 4.0 - 10.5 K/uL   RBC 3.56 (L) 4.22 - 5.81 MIL/uL   Hemoglobin 11.1 (L) 13.0 - 17.0 g/dL   HCT 97.3 (L) 53.2 - 99.2 %   MCV 91.3 80.0 - 100.0 fL   MCH 31.2 26.0 - 34.0 pg   MCHC 34.2 30.0 - 36.0 g/dL   RDW 42.6 83.4 - 19.6 %   Platelets 163 150 - 400 K/uL   nRBC 0.0 0.0 - 0.2 %    Comment: Performed at [facility], [address] [place], [place] [address]  Basic metabolic panel  Status: Abnormal   Collection Time: 08/23/22  4:14 AM  Result Value Ref Range   Sodium 134 (L) 135 - 145 mmol/L   Potassium 3.8 3.5 - 5.1 mmol/L   Chloride 104 98 - 111 mmol/L   CO2 24 22 - 32 mmol/L   Glucose, Bld 95 70 - 99 mg/dL    Comment: Glucose reference range applies only to samples taken after fasting for at least 8 hours.   BUN 13 8 - 23 mg/dL   Creatinine, Ser 6.30 0.61 - 1.24 mg/dL   Calcium 8.8 (L) 8.9 - 10.3 mg/dL   GFR, Estimated >16 >01 mL/min    Comment: (NOTE) Calculated using the CKD-EPI Creatinine Equation (2021)    Anion gap 6 5 - 15    Comment: Performed at [facility], [address] [place], [place] [address]  Magnesium     Status: None   Collection Time: 08/23/22  4:14 AM  Result Value Ref Range   Magnesium 2.2 1.7 - 2.4 mg/dL    Comment: Performed at [facility], [address], [place], [place] [address]    CT ABDOMEN PELVIS W CONTRAST  Result Date: 08/20/2022 CLINICAL DATA:  Abdominal pain,  known hernia EXAM: CT ABDOMEN AND PELVIS WITH CONTRAST TECHNIQUE: Multidetector CT imaging of the abdomen and pelvis was performed using the standard protocol following bolus administration of intravenous contrast. RADIATION DOSE REDUCTION: This exam was performed according to the departmental dose-optimization program which includes automated exposure control, adjustment of the mA and/or kV according to patient size and/or use of iterative reconstruction technique. CONTRAST:  OMNIPAQUE IOHEXOL 300 MG/ML  SOLN COMPARISON:  07/13/2022 FINDINGS: Lower chest: Lung bases are clear. Hepatobiliary: Small hepatic cysts, unchanged from prior, benign benign. Gallbladder is unremarkable. No intrahepatic or extrahepatic duct dilatation. Pancreas: Subtle hypoenhancement along the pancreatic uncinate process (series 2/image 28), measuring 2.6 x 1.8 cm. This is equivocal but warrants additional evaluation to exclude a true pancreatic lesion in this location. No pancreatic atrophy or ductal effusion. Spleen: Within normal limits. Adrenals/Urinary Tract: Adrenal glands are within normal limits. Kidneys are within normal limits.  No hydronephrosis. Bladder is within normal limits. Stomach/Bowel: Stomach is within normal limits. No evidence of bowel obstruction. Appendix is not discretely visualized. Mild sigmoid diverticulosis, without evidence of diverticulitis. Vascular/Lymphatic: No evidence of abdominal aortic aneurysm. Atherosclerotic calcifications of the abdominal aorta and branch vessels. No suspicious abdominopelvic lymphadenopathy. Reproductive: Prostate is unremarkable. Other: No abdominopelvic ascites. Tiny left inguinal hernia containing a knuckle of nondilated small bowel (series 2/image 61). Musculoskeletal: Degenerative changes of the visualized thoracolumbar spine. IMPRESSION: Tiny left inguinal hernia containing a knuckle of nondilated small bowel. No evidence of bowel obstruction. Subtle hypoenhancement  along the pancreatic uncinate process, measuring 2.6 x 1.8 cm. This is equivocal but warrants additional evaluation to exclude a true pancreatic lesion in this location. MRI abdomen with/without contrast is suggested for further evaluation. Electronically Signed   By: Charline Bills M.D.   On: 08/20/2022 02:56    Past Medical History:  Diagnosis Date   Allergy    Anxiety    Depression    DJD (degenerative joint disease), cervical    postiton with pillow under knees, cant turn neck    Dysentery, amebic, acute 1981   GERD (gastroesophageal reflux disease)    H/O bronchitis    H/O malaria 1984   Hearing loss    bilateral    Hypertension    labile Blood pressure  Inguinal hernia    Insomnia    early morning awakening   MVP (mitral valve prolapse)    "no problems"   Perianal pain    Personal history of colonic polyps    Tinnitus    right ear    Past Surgical History:  Procedure Laterality Date   CARPAL TUNNEL RELEASE  10/06, 5/10   right wrist    CARPAL TUNNEL RELEASE  3/10   left wrist    cervical spine discectomy   09/2005   COLONOSCOPY WITH PROPOFOL N/A 10/21/2015   Procedure: COLONOSCOPY WITH PROPOFOL;  Surgeon: Charolett Bumpers, MD;  Location: [facility];  Service: Endoscopy;  Laterality: N/A;   INGUINAL HERNIA REPAIR  08/16/2011   Procedure: HERNIA REPAIR INGUINAL ADULT;  Surgeon: Valarie Merino, MD;  Location:  SURGERY CENTER;  Service: General;  Laterality: Left;   INGUINAL HERNIA REPAIR Right 05/03/2014   Procedure: OPEN RIGHT INGUINAL HERNIA REPAIR WITH MESH;  Surgeon: Wenda Low, MD;  Location: [facility];  Service: General;  Laterality: Right;   INGUINAL HERNIA REPAIR Left 08/20/2022   Procedure: HERNIA REPAIR INGUINAL ADULT;  Surgeon: Manus Rudd, MD;  Location: [facility];  Service: General;  Laterality: Left;   INSERTION OF MESH Right 05/03/2014   Procedure: INSERTION OF MESH;  Surgeon: Wenda Low, MD;  Location: [facility];  Service: General;  Laterality:  Right;   TONSILLECTOMY  1960    Social History   Socioeconomic History   Marital status: Married    Spouse name: Not on file   Number of children: 2   Years of education: Not on file   Highest education level: Professional school degree (e.g., MD, DDS, DVM, JD)  Occupational History   Occupation: psychologist  Tobacco Use   Smoking status: Never   Smokeless tobacco: Never  Vaping Use   Vaping Use: Never used  Substance and Sexual Activity   Alcohol use: Yes    Comment: 2 wine daily   Drug use: Yes    Types: Marijuana    Comment: weekend use   Sexual activity: Yes  Other Topics Concern   Not on file  Social History Narrative   Lives with wife and son in a 3 story home.  His daughter passed away from drug overdose.  He is a self employed Counsellor.     Social Determinants of Health   Financial Resource Strain: Not on file  Food Insecurity: No Food Insecurity (08/20/2022)   Hunger Vital Sign    Worried About Running Out of Food in the Last Year: Never true    Ran Out of Food in the Last Year: Never true  Transportation Needs: No Transportation Needs (08/20/2022)   PRAPARE - Administrator, Civil Service (Medical): No    Lack of Transportation (Non-Medical): No  Physical Activity: Not on file  Stress: Not on file  Social Connections: Not on file  Intimate Partner Violence: Not At Risk (08/20/2022)   Humiliation, Afraid, Rape, and Kick questionnaire    Fear of Current or Ex-Partner: No    Emotionally Abused: No    Physically Abused: No    Sexually Abused: No    Family History  Problem Relation Age of Onset   Cancer Mother        breast   Intracerebral hemorrhage Father     Current Facility-Administered Medications  Medication Dose Route Frequency Provider Last Rate Last Admin   acetaminophen (TYLENOL) tablet 650 mg  650 mg Oral  Q6H PRN Manus Rudd, MD   650 mg at 08/23/22 0443   amLODipine (NORVASC) tablet 5 mg  5 mg Oral Daily  Manus Rudd, MD   5 mg at 08/22/22 1610   atorvastatin (LIPITOR) tablet 10 mg  10 mg Oral Once per day on Mon Wed Fri Mayar Whittier, MD       busPIRone (BUSPAR) tablet 20 mg  20 mg Oral TID Manus Rudd, MD   20 mg at 08/22/22 2049   docusate sodium (COLACE) capsule 100 mg  100 mg Oral BID Phylliss Blakes A, MD   100 mg at 08/22/22 2050   doxepin (SINEQUAN) capsule 25 mg  25 mg Oral QHS Manus Rudd, MD   25 mg at 08/22/22 2058   enoxaparin (LOVENOX) injection 40 mg  40 mg Subcutaneous Q24H Manus Rudd, MD   40 mg at 08/22/22 0813   HYDROmorphone (DILAUDID) injection 0.5 mg  0.5 mg Intravenous Q4H PRN Phylliss Blakes A, MD       ketorolac (TORADOL) 15 MG/ML injection 15 mg  15 mg Intravenous Q6H PRN Phylliss Blakes A, MD   15 mg at 08/22/22 2050   LORazepam (ATIVAN) tablet 1 mg  1 mg Oral Q breakfast Manus Rudd, MD   1 mg at 08/22/22 0745   LORazepam (ATIVAN) tablet 2 mg  2 mg Oral QHS Manus Rudd, MD   2 mg at 08/22/22 2049   losartan (COZAAR) tablet 100 mg  100 mg Oral Daily Manus Rudd, MD   100 mg at 08/22/22 9604   menthol-cetylpyridinium (CEPACOL) lozenge 3 mg  1 lozenge Oral PRN Abigail Miyamoto, MD   3 mg at 08/21/22 0444   methocarbamol (ROBAXIN) tablet 500 mg  500 mg Oral Q6H PRN Berna Bue, MD       mirtazapine (REMERON) tablet 30 mg  30 mg Oral QHS Manus Rudd, MD   30 mg at 08/22/22 2048   multivitamin with minerals tablet 1 tablet  1 tablet Oral Daily Karie Soda, MD       mupirocin ointment (BACTROBAN) 2 % 1 Application  1 Application Nasal BID Manus Rudd, MD   1 Application at 08/22/22 2050   ondansetron (ZOFRAN-ODT) disintegrating tablet 4 mg  4 mg Oral Q6H PRN Manus Rudd, MD   4 mg at 08/21/22 1721   Or   ondansetron (ZOFRAN) injection 4 mg  4 mg Intravenous Q6H PRN Manus Rudd, MD       oxyCODONE (Oxy IR/ROXICODONE) immediate release tablet 5 mg  5 mg Oral Q6H PRN Phylliss Blakes A, MD   5 mg at 08/22/22 1659   pregabalin (LYRICA)  capsule 75 mg  75 mg Oral BID Manus Rudd, MD   75 mg at 08/22/22 2049   simethicone (MYLICON) chewable tablet 80 mg  80 mg Oral QID PRN Berna Bue, MD   80 mg at 08/22/22 1747   traZODone (DESYREL) tablet 50 mg  50 mg Oral QHS PRN Manus Rudd, MD   50 mg at 08/22/22 2048   zinc sulfate capsule 220 mg  220 mg Oral Daily Karie Soda, MD       zolpidem (AMBIEN) tablet 5 mg  5 mg Oral QHS PRN Manus Rudd, MD   5 mg at 08/22/22 2048     Allergies  Allergen Reactions   Gabapentin Other (See Comments)    Unable to urinate, drowsiness   Trileptal [Oxcarbazepine] Other (See Comments)    Bad taste in his mouth    Signed:  Ardeth Sportsman, MD, FACS, MASCRS Esophageal, Gastrointestinal & Colorectal Surgery Robotic and Minimally Invasive Surgery  [facility] [address] [place], [place] [address] [phone] Fax [phone] Main  CONTACT INFORMATION:  Weekday (9AM-5PM): Call [facility] main office at [phone]  Weeknight (5PM-9AM) or Weekend/Holiday: Check [email or web address] (password " [email or web address]") for General Surgery [facility] coverage  (Please, do not use SecureChat as it is not reliable communication to reach operating surgeons for immediate patient care given surgeries/outpatient duties/clinic/cross-coverage/off post-call which would lead to a delay in care.  Epic staff messaging available for outptient concerns, but may not be answered for 48 hours or more).     08/23/2022, 7:53 AM

## 2022-08-23 NOTE — TOC Transition Note (Signed)
Transition of Care [facility]) - CM/SW Discharge Note   Patient Details  Name: Brad [name] MRN: [id number] Date of Birth: [birth year]  Transition [facility]) CM/SW Contact:  Lennart Pall, LCSW Phone Number: 08/23/2022, 9:38 AM   Clinical Narrative:     Met with pt to review possible dc needs (TOC order placed stating pt was concerned about being home alone).  Pt up and walking around room without difficulty.  Has a friend who will be providing dc transportation today.  Provided pt a list of private duty caregiver agencies and explained that this is a private pay arrangement that he would need to set up if he feels need for additional support in home.  Pt states understanding and appreciative of information.  No further TOC needs.  Final next level of care: Home/Self Care Barriers to Discharge: No Barriers Identified   Patient Goals and CMS Choice   Choice offered to / list presented to : NA  Discharge Placement                         Discharge Plan and Services Additional resources added to the After Visit Summary for   In-house Referral: Clinical Social Work   Post Acute Care Choice: NA          DME Arranged: N/A DME Agency: NA                  Social Determinants of Health ([place]) Interventions [place]: No Food Insecurity (08/20/2022)  Housing: Low Risk  (08/20/2022)  Transportation Needs: No Transportation Needs (08/20/2022)  Utilities: Not At Risk (08/20/2022)  Alcohol Screen: Low Risk  (02/27/2019)  Tobacco Use: Low Risk  (08/21/2022)     Readmission Risk Interventions    08/23/2022    9:38 AM  Readmission Risk Prevention Plan  Post Dischage Appt Complete  Medication Screening Complete  Transportation Screening Complete

## 2022-08-23 NOTE — Progress Notes (Signed)
Pt reports he banged his head on cabinet and surgical site when washing his hands. No visible injuries or swelling noted on forehead and surgical site. Vitals signs were normal. Immediate needs addressed. Pt placed in comfortable position. Call bell placed within reach.

## 2022-08-23 NOTE — Plan of Care (Signed)
Problem: Clinical Measurements: Goal: Ability to maintain clinical measurements within normal limits will improve Outcome: Progressing   Problem: Activity: Goal: Risk for activity intolerance will decrease Outcome: Progressing   Problem: Education: Goal: Knowledge of General Education information will improve Description: Including pain rating scale, medication(s)/side effects and non-pharmacologic comfort measures Outcome: Progressing   Haydee Salter, RN 08/23/22 8:14 AM

## 2022-08-26 ENCOUNTER — Emergency Department (HOSPITAL_COMMUNITY): Payer: Medicare Other

## 2022-08-26 ENCOUNTER — Encounter (HOSPITAL_COMMUNITY): Payer: Self-pay

## 2022-08-26 ENCOUNTER — Other Ambulatory Visit: Payer: Self-pay

## 2022-08-26 ENCOUNTER — Emergency Department (HOSPITAL_COMMUNITY)
Admission: EM | Admit: 2022-08-26 | Discharge: 2022-08-27 | Disposition: A | Discharge status: Home / Self Care | Payer: Medicare Other | Attending: Emergency Medicine | Admitting: Emergency Medicine

## 2022-08-26 DIAGNOSIS — K5900 Constipation, unspecified: Secondary | ICD-10-CM | POA: Diagnosis not present

## 2022-08-26 DIAGNOSIS — R109 Unspecified abdominal pain: Secondary | ICD-10-CM | POA: Diagnosis present

## 2022-08-26 LAB — COMPREHENSIVE METABOLIC PANEL
ALT: 29 U/L (ref 0–44)
AST: 32 U/L (ref 15–41)
Albumin: 4.2 g/dL (ref 3.5–5.0)
Alkaline Phosphatase: 62 U/L (ref 38–126)
Anion gap: 9 (ref 5–15)
BUN: 16 mg/dL (ref 8–23)
CO2: 24 mmol/L (ref 22–32)
Calcium: 9.6 mg/dL (ref 8.9–10.3)
Chloride: 99 mmol/L (ref 98–111)
Creatinine, Ser: 0.82 mg/dL (ref 0.61–1.24)
GFR, Estimated: >60 mL/min (ref >60)
Glucose, Bld: 102 mg/dL — ABNORMAL HIGH (ref 70–99)
Potassium: 3.8 mmol/L (ref 3.5–5.1)
Sodium: 132 mmol/L — ABNORMAL LOW (ref 135–145)
Total Bilirubin: 0.9 mg/dL (ref 0.3–1.2)
Total Protein: 7.1 g/dL (ref 6.5–8.1)

## 2022-08-26 LAB — CBC WITH DIFFERENTIAL/PLATELET
Abs Immature Granulocytes: 0.03 10*3/uL (ref 0.00–0.07)
Basophils Absolute: 0 10*3/uL (ref 0.0–0.1)
Basophils Relative: 0 %
Eosinophils Absolute: 0.2 10*3/uL (ref 0.0–0.5)
Eosinophils Relative: 2 %
HCT: 36.5 % — ABNORMAL LOW (ref 39.0–52.0)
Hemoglobin: 13.2 g/dL (ref 13.0–17.0)
Immature Granulocytes: 0 %
Lymphocytes Relative: 18 %
Lymphs Abs: 1.7 10*3/uL (ref 0.7–4.0)
MCH: 31.9 pg (ref 26.0–34.0)
MCHC: 36.2 g/dL — ABNORMAL HIGH (ref 30.0–36.0)
MCV: 88.2 fL (ref 80.0–100.0)
Monocytes Absolute: 0.7 10*3/uL (ref 0.1–1.0)
Monocytes Relative: 7 %
Neutro Abs: 6.8 10*3/uL (ref 1.7–7.7)
Neutrophils Relative %: 73 %
Platelets: 210 10*3/uL (ref 150–400)
RBC: 4.14 MIL/uL — ABNORMAL LOW (ref 4.22–5.81)
RDW: 13.5 % (ref 11.5–15.5)
WBC: 9.3 10*3/uL (ref 4.0–10.5)
nRBC: 0 % (ref 0.0–0.2)

## 2022-08-26 LAB — LIPASE, BLOOD: Lipase: 70 U/L — ABNORMAL HIGH (ref 11–51)

## 2022-08-26 LAB — LACTIC ACID, PLASMA: Lactic Acid, Venous: 1.3 mmol/L (ref 0.5–1.9)

## 2022-08-26 MED ORDER — LACTULOSE 10 GM/15ML PO SOLN: 30.0000 g | Freq: Once | ORAL | Status: DC

## 2022-08-26 MED ORDER — ONDANSETRON HCL 4 MG/2ML IJ SOLN
4.0000 mg | Freq: Once | INTRAMUSCULAR | Status: AC
  Administered 2022-08-26: 4 mg via INTRAVENOUS
  Filled 2022-08-26: qty 2

## 2022-08-26 MED ORDER — SODIUM CHLORIDE 0.9 % IV BOLUS
1000.0000 mL | Freq: Once | INTRAVENOUS | Status: AC
  Administered 2022-08-26: 1000 mL via INTRAVENOUS

## 2022-08-26 MED ORDER — SORBITOL 70 % SOLN
960.0000 mL | TOPICAL_OIL | Freq: Once | ORAL | Status: AC
  Administered 2022-08-26: 960 mL via RECTAL
  Filled 2022-08-26: qty 240

## 2022-08-26 MED ORDER — IOHEXOL 350 MG/ML SOLN
100.0000 mL | Freq: Once | INTRAVENOUS | Status: AC | PRN
  Administered 2022-08-26: 100 mL via INTRAVENOUS

## 2022-08-26 MED ORDER — FENTANYL CITRATE PF 50 MCG/ML IJ SOSY
50.0000 ug | PREFILLED_SYRINGE | Freq: Once | INTRAMUSCULAR | Status: AC
  Administered 2022-08-26: 50 ug via INTRAVENOUS
  Filled 2022-08-26: qty 1

## 2022-08-26 MED ORDER — FLEET ENEMA 7-19 GM/118ML RE ENEM
1.0000 | ENEMA | Freq: Once | RECTAL | Status: DC
  Filled 2022-08-26: qty 1

## 2022-08-26 MED ORDER — POLYETHYLENE GLYCOL 3350 17 G PO PACK: 17.0000 g | PACK | Freq: Every day | ORAL | 0 refills | Status: DC

## 2022-08-26 NOTE — ED Triage Notes (Signed)
Pt BIB EMS from home. Pt states he had hernia surgery last week. Pt c/o abdominal pain x2 days, last bowel movement 2 days ago. Pt c/o constipation.  132/82 84 HR 18 Resp 98% RA

## 2022-08-26 NOTE — Discharge Instructions (Addendum)
Take MiraLAX twice daily and follow-up with your surgeon.  Return to the ED with new or worsening symptoms.  Take tylenol 1,000 mg (2 pills) 3x daily and only use your oxy for breakthrough pain. Use miralax as much as needed until stooling appropriately (up to 3x daily)

## 2022-08-26 NOTE — ED Provider Notes (Signed)
**Brad Brad** Brad Brad   CSN: [id number] Arrival date & time: 08/26/22  1657     History  Chief Complaint  Patient presents with   Constipation    Brad Brad is a 66 y.o. male.  Patient presents with severe left-sided abdominal pain.  He states he had hernia surgery on December 21 and is having severe pain worsening over the past 2 days.  States has not had a bowel movement for the past 5 days.  Last vomit was when he was in the hospital had an enema.  Has had increasing pain to his left surgical site near his groin.  Believes he overdid it by lifting and moving too much and not knowing who supposed to do at home.  He had nausea but no vomiting.  Does not think he had a fever.  No chest pain or shortness of breath.  Complains of severe rectal pain unable to sit down and feels constipated and something stuck in his rectum. He is been taking his pain medication at home without relief.  He is quite uncomfortable with severe pain to his left lower abdomen at the site of surgery.  Does not think he had a fever   Constipation Associated symptoms: abdominal pain and nausea   Associated symptoms: no dysuria, no fever and no vomiting        Home Medications Prior to Admission medications   Medication Sig Start Date End Date Taking? Authorizing Provider  Alpha-Lipoic Acid 600 MG CAPS Take 1 capsule by mouth daily.    [provider]  amLODipine (NORVASC) 5 MG tablet Take 2 tablets (10 mg total) by mouth daily. Patient taking differently: Take 5 mg by mouth daily. 03/02/19   Malvin Johns, MD  atorvastatin (LIPITOR) 10 MG tablet Take 10 mg by mouth 3 (three) times a week. MWF 08/10/19   [provider]  busPIRone (BUSPAR) 10 MG tablet Take 20 mg by mouth 3 (three) times daily.     [provider]  Cholecalciferol (VITAMIN D3) 125 MCG (5000 UT) TABS Take 1 tablet by mouth daily.    [provider]  Coenzyme Q10 50 MG  CAPS Take 1 capsule by mouth daily.    [provider]  doxepin (SINEQUAN) 25 MG capsule Take 25 mg by mouth at bedtime.    [provider]  LORazepam (ATIVAN) 1 MG tablet Take 1-2 mg by mouth 2 (two) times daily. Take 1 tablet in the morning and Take 2 tablets at bedtime 10/26/18   [provider]  losartan (COZAAR) 100 MG tablet Take 100 mg by mouth daily. 02/04/20   [provider]  memantine (NAMENDA) 10 MG tablet Take 1 tablet (10 mg total) by mouth 2 (two) times daily. Patient not taking: Reported on 08/20/2022 03/02/19   Malvin Johns, MD  Methylfol-Methylcob-Acetylcyst (CEREFOLIN NAC) 6-2-600 MG TABS 1 a day Patient not taking: Reported on 08/20/2022 03/02/19   Malvin Johns, MD  metoprolol tartrate (LOPRESSOR) 25 MG tablet Take 25 mg by mouth 2 (two) times daily. Per Pt he "restarted it" as his MD had stopped it this week    [provider]  mirtazapine (REMERON) 30 MG tablet Take 30 mg by mouth at bedtime.    [provider]  Multiple Vitamin (MULTIVITAMIN) tablet Take 1 tablet by mouth daily.    [provider]  Omega-3 1000 MG CAPS Take 1 capsule by mouth daily.    [provider]  oxyCODONE (  OXY IR/ROXICODONE) 5 MG immediate release tablet Take 1 tablet (5 mg total) by mouth every 6 (six) hours as needed for severe pain or breakthrough pain. 08/23/22   Karie Soda, MD  pregabalin (LYRICA) 75 MG capsule Take 75 mg by mouth 2 (two) times daily.    [provider]  Prenatal Vit-Fe Fumarate-FA (PRENATAL MULTIVITAMIN) TABS tablet Take 1 tablet by mouth daily. Patient not taking: Reported on 08/20/2022 03/03/19   Malvin Johns, MD  traZODone (DESYREL) 50 MG tablet Take 50 mg by mouth at bedtime.    [provider]  zinc gluconate 50 MG tablet Take 50 mg by mouth daily.    [provider]  zolpidem (AMBIEN CR) 12.5 MG CR tablet Take 12.5 mg by mouth at bedtime.     [provider]       Allergies    Gabapentin and Trileptal [oxcarbazepine]    Review of Systems   Review of Systems  Constitutional:  Positive for activity change and appetite change. Negative for fever.  HENT:  Negative for congestion and rhinorrhea.   Respiratory:  Negative for cough, chest tightness and shortness of breath.   Cardiovascular:  Negative for chest pain.  Gastrointestinal:  Positive for abdominal pain, constipation and nausea. Negative for vomiting.  Genitourinary:  Negative for dysuria and hematuria.  Musculoskeletal:  Negative for arthralgias.  Skin:  Negative for rash.  Neurological:  Negative for dizziness, weakness and headaches.   all other systems are negative except as noted in the HPI and PMH.    Physical Exam Updated Vital Signs BP (!) 145/94   Pulse 70   Temp 98.6 F (37 C)   Resp (!) 22   SpO2 99%  Physical Exam Vitals and nursing Brad reviewed.  Constitutional:      General: He is not in acute distress.    Appearance: He is well-developed.  HENT:     Head: Normocephalic and atraumatic.     Mouth/Throat:     Pharynx: No oropharyngeal exudate.  Eyes:     Conjunctiva/sclera: Conjunctivae normal.     Pupils: Pupils are equal, round, and reactive to light.  Neck:     Comments: No meningismus. Cardiovascular:     Rate and Rhythm: Normal rate and regular rhythm.     Heart sounds: Normal heart sounds. No murmur heard. Pulmonary:     Effort: Pulmonary effort is normal. No respiratory distress.     Breath sounds: Normal breath sounds.  Abdominal:     Palpations: Abdomen is soft.     Tenderness: There is abdominal tenderness. There is guarding. There is no rebound.     Comments: LLQ pain tenderness with guarding.  Ecchymosis to left groin and scrotum.  Genitourinary:    Comments: Firm stool just past fingertip, not able to disimpact Musculoskeletal:        General: No tenderness. Normal range of motion.     Cervical back: Normal range of motion and neck supple.   Skin:    General: Skin is warm.  Neurological:     Mental Status: He is alert and oriented to person, place, and time.     Cranial Nerves: No cranial nerve deficit.     Motor: No abnormal muscle tone.     Coordination: Coordination normal.     Comments:  5/5 strength throughout. CN 2-12 intact.Equal grip strength.   Psychiatric:        Behavior: Behavior normal.     ED Results / Procedures /  Treatments   Labs (all labs ordered are listed, but only abnormal results are displayed) Labs Reviewed  CBC WITH DIFFERENTIAL/PLATELET - Abnormal; Notable for the following components:      Result Value   RBC 4.14 (*)    HCT 36.5 (*)    MCHC 36.2 (*)    All other components within normal limits  COMPREHENSIVE METABOLIC PANEL - Abnormal; Notable for the following components:   Sodium 132 (*)    Glucose, Bld 102 (*)    All other components within normal limits  LIPASE, BLOOD - Abnormal; Notable for the following components:   Lipase 70 (*)    All other components within normal limits  LACTIC ACID, PLASMA  URINALYSIS, ROUTINE W REFLEX MICROSCOPIC  LACTIC ACID, PLASMA  POC OCCULT BLOOD, ED    EKG EKG Interpretation  Date/Time:  Thursday August 26 2022 17:10:23 EST Ventricular Rate:  70 PR Interval:  177 QRS Duration: 105 QT Interval:  406 QTC Calculation: 439 R Axis:   -56 Text Interpretation: Sinus rhythm Incomplete RBBB and LAFB Abnormal R-wave progression, early transition No significant change was found Confirmed by Glynn Octave [phone]) on 08/26/2022 6:50:01 PM  Radiology CT Angio Chest PE W and/or Wo Contrast  Result Date: 08/26/2022 CLINICAL DATA:  Hernia surgery last week, abdominal pain for 2 days, constipated, positive D dimer EXAM: CT ANGIOGRAPHY CHEST CT ABDOMEN AND PELVIS WITH CONTRAST TECHNIQUE: Multidetector CT imaging of the chest was performed using the standard protocol during bolus administration of intravenous contrast. Multiplanar CT image  reconstructions and MIPs were obtained to evaluate the vascular anatomy. Multidetector CT imaging of the abdomen and pelvis was performed using the standard protocol during bolus administration of intravenous contrast. RADIATION DOSE REDUCTION: This exam was performed according to the departmental dose-optimization program which includes automated exposure control, adjustment of the mA and/or kV according to patient size and/or use of iterative reconstruction technique. CONTRAST:  OMNIPAQUE IOHEXOL 350 MG/ML SOLN COMPARISON:  08/20/2022 FINDINGS: CTA CHEST FINDINGS Cardiovascular: This is a technically adequate evaluation of the pulmonary vasculature. No filling defects or pulmonary emboli. The heart is enlarged without pericardial effusion. Normal caliber of the thoracic aorta. Atherosclerosis of the aorta and coronary vasculature. Mediastinum/Nodes: No enlarged mediastinal, hilar, or axillary lymph nodes. Thyroid gland, trachea, and esophagus demonstrate no significant findings. Lungs/Pleura: No acute airspace disease, effusion, or pneumothorax. Central airways are patent. Musculoskeletal: No acute or destructive bony lesions. Reconstructed images demonstrate no additional findings. Review of the MIP images confirms the above findings. CT ABDOMEN and PELVIS FINDINGS Hepatobiliary: No focal liver abnormality is seen. No gallstones, gallbladder wall thickening, or biliary dilatation. Pancreas: Unremarkable. No pancreatic ductal dilatation or surrounding inflammatory changes. Spleen: Normal in size without focal abnormality. Adrenals/Urinary Tract: Adrenal glands are unremarkable. Kidneys are normal, without renal calculi, focal lesion, or hydronephrosis. Bladder is unremarkable. Stomach/Bowel: No bowel obstruction or ileus. Normal appendix right lower quadrant. Diverticulosis of the sigmoid colon without evidence of diverticulitis. There is a large amount of retained stool within the rectum consistent with  fecal impaction. Vascular/Lymphatic: Aortic atherosclerosis. No enlarged abdominal or pelvic lymph nodes. Reproductive: Prostate is unremarkable. Other: No free fluid or free intraperitoneal gas. There are postsurgical changes from recent left inguinal hernia repair. There is subcutaneous fluid and a small amount of gas consistent with recent surgical intervention, without evidence of fluid collection or abscess. No evidence of bowel herniation. Musculoskeletal: No acute or destructive bony lesions. Reconstructed images demonstrate no additional findings. Review of the MIP  images confirms the above findings. IMPRESSION: 1. No evidence of pulmonary embolus. 2. Postsurgical changes from recent left inguinal hernia repair. Minimal subcutaneous fluid and gas consistent with recent surgical intervention. No evidence of abscess. 3. Large amount of retained stool within the rectal vault consistent with fecal impaction. No bowel obstruction or ileus. 4.  Aortic Atherosclerosis (ICD10-I70.0). Electronically Signed   By: Sharlet SalinaMichael  Brown M.D.   On: 08/26/2022 19:55   CT ABDOMEN PELVIS W CONTRAST  Result Date: 08/26/2022 CLINICAL DATA:  Hernia surgery last week, abdominal pain for 2 days, constipated, positive D dimer EXAM: CT ANGIOGRAPHY CHEST CT ABDOMEN AND PELVIS WITH CONTRAST TECHNIQUE: Multidetector CT imaging of the chest was performed using the standard protocol during bolus administration of intravenous contrast. Multiplanar CT image reconstructions and MIPs were obtained to evaluate the vascular anatomy. Multidetector CT imaging of the abdomen and pelvis was performed using the standard protocol during bolus administration of intravenous contrast. RADIATION DOSE REDUCTION: This exam was performed according to the departmental dose-optimization program which includes automated exposure control, adjustment of the mA and/or kV according to patient size and/or use of iterative reconstruction technique. CONTRAST:  100mL  OMNIPAQUE IOHEXOL 350 MG/ML SOLN COMPARISON:  08/20/2022 FINDINGS: CTA CHEST FINDINGS Cardiovascular: This is a technically adequate evaluation of the pulmonary vasculature. No filling defects or pulmonary emboli. The heart is enlarged without pericardial effusion. Normal caliber of the thoracic aorta. Atherosclerosis of the aorta and coronary vasculature. Mediastinum/Nodes: No enlarged mediastinal, hilar, or axillary lymph nodes. Thyroid gland, trachea, and esophagus demonstrate no significant findings. Lungs/Pleura: No acute airspace disease, effusion, or pneumothorax. Central airways are patent. Musculoskeletal: No acute or destructive bony lesions. Reconstructed images demonstrate no additional findings. Review of the MIP images confirms the above findings. CT ABDOMEN and PELVIS FINDINGS Hepatobiliary: No focal liver abnormality is seen. No gallstones, gallbladder wall thickening, or biliary dilatation. Pancreas: Unremarkable. No pancreatic ductal dilatation or surrounding inflammatory changes. Spleen: Normal in size without focal abnormality. Adrenals/Urinary Tract: Adrenal glands are unremarkable. Kidneys are normal, without renal calculi, focal lesion, or hydronephrosis. Bladder is unremarkable. Stomach/Bowel: No bowel obstruction or ileus. Normal appendix right lower quadrant. Diverticulosis of the sigmoid colon without evidence of diverticulitis. There is a large amount of retained stool within the rectum consistent with fecal impaction. Vascular/Lymphatic: Aortic atherosclerosis. No enlarged abdominal or pelvic lymph nodes. Reproductive: Prostate is unremarkable. Other: No free fluid or free intraperitoneal gas. There are postsurgical changes from recent left inguinal hernia repair. There is subcutaneous fluid and a small amount of gas consistent with recent surgical intervention, without evidence of fluid collection or abscess. No evidence of bowel herniation. Musculoskeletal: No acute or destructive bony  lesions. Reconstructed images demonstrate no additional findings. Review of the MIP images confirms the above findings. IMPRESSION: 1. No evidence of pulmonary embolus. 2. Postsurgical changes from recent left inguinal hernia repair. Minimal subcutaneous fluid and gas consistent with recent surgical intervention. No evidence of abscess. 3. Large amount of retained stool within the rectal vault consistent with fecal impaction. No bowel obstruction or ileus. 4.  Aortic Atherosclerosis (ICD10-I70.0). Electronically Signed   By: Sharlet SalinaMichael  Brown M.D.   On: 08/26/2022 19:55    Procedures Procedures    Medications Ordered in ED Medications  sodium chloride 0.9 % bolus 1,000 mL (has no administration in time range)  fentaNYL (SUBLIMAZE) injection 50 mcg (has no administration in time range)  sodium chloride 0.9 % bolus 1,000 mL (has no administration in time range)  ondansetron (ZOFRAN) injection 4  mg (has no administration in time range)    ED Course/ Medical Decision Making/ A&P Clinical Course as of 08/26/22 2351  Thu Aug 26, 2022  2310 Enema, poop and go home [MK]    Clinical Course User Index [MK] Kommor, Wyn Forster, MD                           Medical Decision Making Amount and/or Complexity of Data Reviewed Labs: ordered. Decision-making details documented in ED Course. Radiology: ordered and independent interpretation performed. Decision-making details documented in ED Course. ECG/medicine tests: ordered and independent interpretation performed. Decision-making details documented in ED Course.  Risk OTC drugs. Prescription drug management.   Left lower quadrant abdominal pain status post inguinal surgery repair 7 days ago.  He is uncomfortable.  He has firm stool on rectal exam but not able to disimpact.  No vomiting.  Will give IV fluids, pain and nausea medications  Imaging is remarkable for mild stranding in the surgical bed without evidence of abscess or drainable fluid  collection.  No evidence of bowel obstruction.  Does show constipation and fecal impaction. CT scan with postsurgical changes and constipation.  Discussed with Dr. Magnus Ivan of general surgery.  He agrees safe for enema and recommends MiraLAX. States patient has usually had pain out of proportion to his exam findings currently.  Enemas given to patient.  No vomiting. Lab work is reassuring without significant leukocytosis  Awaiting bowel movement at shift change.  Unable to mainly disimpact patient due to position of stool. Anticipate discharge home with MiraLAX and surgery follow-up.  Dr. Posey Rea to assume care.       Final Clinical Impression(s) / ED Diagnoses Final diagnoses:  None    Rx / DC Orders ED Discharge Orders     None         Dorianne Perret, Jeannett Senior, MD 08/26/22 2353

## 2022-08-27 DIAGNOSIS — K5900 Constipation, unspecified: Secondary | ICD-10-CM | POA: Diagnosis not present

## 2022-08-27 MED ORDER — ACETAMINOPHEN 500 MG PO TABS: 1000.0000 mg | ORAL_TABLET | Freq: Three times a day (TID) | ORAL | 0 refills | Status: AC

## 2022-08-27 MED ORDER — KETOROLAC TROMETHAMINE 15 MG/ML IJ SOLN
15.0000 mg | Freq: Once | INTRAMUSCULAR | Status: AC
  Administered 2022-08-27: 15 mg via INTRAVENOUS
  Filled 2022-08-27: qty 1

## 2022-08-27 NOTE — ED Provider Notes (Signed)
  Physical Exam  BP (!) 143/100 (BP Location: Right Arm)   Pulse 64   Temp 98.3 F (36.8 C) (Oral)   Resp 16   SpO2 98%   Physical Exam Vitals and nursing note reviewed.  Constitutional:      General: He is not in acute distress.    Appearance: He is well-developed.  HENT:     Head: Normocephalic and atraumatic.  Eyes:     Conjunctiva/sclera: Conjunctivae normal.  Cardiovascular:     Rate and Rhythm: Normal rate and regular rhythm.     Heart sounds: No murmur heard. Pulmonary:     Effort: Pulmonary effort is normal. No respiratory distress.     Breath sounds: Normal breath sounds.  Abdominal:     Palpations: Abdomen is soft.     Tenderness: There is abdominal tenderness.  Musculoskeletal:        General: No swelling.     Cervical back: Neck supple.  Skin:    General: Skin is warm and dry.     Capillary Refill: Capillary refill takes less than 2 seconds.  Neurological:     Mental Status: He is alert.  Psychiatric:        Mood and Affect: Mood normal.     Procedures  Procedures  ED Course / MDM   Clinical Course as of 08/27/22 0132  Thu Aug 26, 2022  2310 Enema, poop and go home [MK]    Clinical Course User Index [MK] Avalee Castrellon, Wyn Forster, MD   Medical Decision Making Amount and/or Complexity of Data Reviewed Labs: ordered. Radiology: ordered.  Risk OTC drugs. Prescription drug management.   Patient received an handoff.  Left lower quadrant abdominal pain found to have constipation.  CT imaging showing constipation only.  Patient did have bowel movements with enema.  I had a long discussion with the patient about his pain and he is currently on no pain regimen outside of intermittent oxycodone use which is worsening his constipation.  I had a long discussion with the patient about religious use of MiraLAX up to 3 times daily until he is stooling appropriately as well as Tylenol 3 times daily.  Patient then discharged       Glendora Score, MD 08/27/22  [phone]

## 2022-08-29 ENCOUNTER — Emergency Department (HOSPITAL_COMMUNITY)
Admission: EM | Admit: 2022-08-29 | Discharge: 2022-08-30 | Disposition: A | Discharge status: Home / Self Care | Payer: Medicare Other | Attending: Emergency Medicine | Admitting: Emergency Medicine

## 2022-08-29 ENCOUNTER — Other Ambulatory Visit: Payer: Self-pay

## 2022-08-29 DIAGNOSIS — Z9889 Other specified postprocedural states: Secondary | ICD-10-CM | POA: Diagnosis not present

## 2022-08-29 DIAGNOSIS — K5900 Constipation, unspecified: Secondary | ICD-10-CM | POA: Insufficient documentation

## 2022-08-29 DIAGNOSIS — R10819 Abdominal tenderness, unspecified site: Secondary | ICD-10-CM | POA: Diagnosis present

## 2022-08-29 DIAGNOSIS — R109 Unspecified abdominal pain: Secondary | ICD-10-CM

## 2022-08-29 DIAGNOSIS — Z8719 Personal history of other diseases of the digestive system: Secondary | ICD-10-CM | POA: Insufficient documentation

## 2022-08-29 NOTE — ED Triage Notes (Addendum)
Patient BIB EMS for evaluation of constipation.  States he has tried "everything possible" to relieve constipation.  Had recent hernia surgery and seen here on 12/28 for same.  Unknown when last BM was.  Continues to state "I have been dealing with this since October."  Pt had fecal impaction noted on CT scan on 12/28.  Relief with enema.

## 2022-08-30 ENCOUNTER — Emergency Department (HOSPITAL_COMMUNITY): Payer: Medicare Other

## 2022-08-30 DIAGNOSIS — R10819 Abdominal tenderness, unspecified site: Secondary | ICD-10-CM | POA: Diagnosis present

## 2022-08-30 DIAGNOSIS — K5900 Constipation, unspecified: Secondary | ICD-10-CM | POA: Diagnosis not present

## 2022-08-30 DIAGNOSIS — Z9889 Other specified postprocedural states: Secondary | ICD-10-CM | POA: Diagnosis not present

## 2022-08-30 DIAGNOSIS — Z8719 Personal history of other diseases of the digestive system: Secondary | ICD-10-CM | POA: Diagnosis not present

## 2022-08-30 LAB — URINALYSIS, ROUTINE W REFLEX MICROSCOPIC
Bilirubin Urine: NEGATIVE
Glucose, UA: NEGATIVE mg/dL
Hgb urine dipstick: NEGATIVE
Ketones, ur: 5 mg/dL — AB
Leukocytes,Ua: NEGATIVE
Nitrite: NEGATIVE
Protein, ur: NEGATIVE mg/dL
Specific Gravity, Urine: 1.011 (ref 1.005–1.030)
pH: 8 (ref 5.0–8.0)

## 2022-08-30 LAB — COMPREHENSIVE METABOLIC PANEL
ALT: 26 U/L (ref 0–44)
AST: 32 U/L (ref 15–41)
Albumin: 4.5 g/dL (ref 3.5–5.0)
Alkaline Phosphatase: 63 U/L (ref 38–126)
Anion gap: 12 (ref 5–15)
BUN: 13 mg/dL (ref 8–23)
CO2: 22 mmol/L (ref 22–32)
Calcium: 9.5 mg/dL (ref 8.9–10.3)
Chloride: 94 mmol/L — ABNORMAL LOW (ref 98–111)
Creatinine, Ser: 0.8 mg/dL (ref 0.61–1.24)
GFR, Estimated: >60 mL/min (ref >60)
Glucose, Bld: 97 mg/dL (ref 70–99)
Potassium: 3.6 mmol/L (ref 3.5–5.1)
Sodium: 128 mmol/L — ABNORMAL LOW (ref 135–145)
Total Bilirubin: 1.2 mg/dL (ref 0.3–1.2)
Total Protein: 7 g/dL (ref 6.5–8.1)

## 2022-08-30 LAB — CBC WITH DIFFERENTIAL/PLATELET
Abs Immature Granulocytes: 0.01 10*3/uL (ref 0.00–0.07)
Basophils Absolute: 0.1 10*3/uL (ref 0.0–0.1)
Basophils Relative: 1 %
Eosinophils Absolute: 0.1 10*3/uL (ref 0.0–0.5)
Eosinophils Relative: 2 %
HCT: 37.5 % — ABNORMAL LOW (ref 39.0–52.0)
Hemoglobin: 13.4 g/dL (ref 13.0–17.0)
Immature Granulocytes: 0 %
Lymphocytes Relative: 29 %
Lymphs Abs: 1.5 10*3/uL (ref 0.7–4.0)
MCH: 31.3 pg (ref 26.0–34.0)
MCHC: 35.7 g/dL (ref 30.0–36.0)
MCV: 87.6 fL (ref 80.0–100.0)
Monocytes Absolute: 0.5 10*3/uL (ref 0.1–1.0)
Monocytes Relative: 9 %
Neutro Abs: 3.1 10*3/uL (ref 1.7–7.7)
Neutrophils Relative %: 59 %
Platelets: 245 10*3/uL (ref 150–400)
RBC: 4.28 MIL/uL (ref 4.22–5.81)
RDW: 13.3 % (ref 11.5–15.5)
WBC: 5.3 10*3/uL (ref 4.0–10.5)
nRBC: 0 % (ref 0.0–0.2)

## 2022-08-30 LAB — LACTIC ACID, PLASMA: Lactic Acid, Venous: 1.3 mmol/L (ref 0.5–1.9)

## 2022-08-30 LAB — LIPASE, BLOOD: Lipase: 54 U/L — ABNORMAL HIGH (ref 11–51)

## 2022-08-30 MED ORDER — ONDANSETRON HCL 4 MG/2ML IJ SOLN
4.0000 mg | Freq: Once | INTRAMUSCULAR | Status: AC
  Administered 2022-08-30: 4 mg via INTRAVENOUS
  Filled 2022-08-30: qty 2

## 2022-08-30 MED ORDER — LORAZEPAM 1 MG PO TABS: 1.0000 mg | ORAL_TABLET | ORAL | Status: DC | PRN

## 2022-08-30 MED ORDER — LACTATED RINGERS IV BOLUS
1000.0000 mL | Freq: Once | INTRAVENOUS | Status: AC
  Administered 2022-08-30: 1000 mL via INTRAVENOUS

## 2022-08-30 MED ORDER — MORPHINE SULFATE (PF) 4 MG/ML IV SOLN
4.0000 mg | Freq: Once | INTRAVENOUS | Status: AC
  Administered 2022-08-30: 4 mg via INTRAVENOUS
  Filled 2022-08-30: qty 1

## 2022-08-30 MED ORDER — IOHEXOL 300 MG/ML  SOLN
100.0000 mL | Freq: Once | INTRAMUSCULAR | Status: AC | PRN
  Administered 2022-08-30: 100 mL via INTRAVENOUS

## 2022-08-30 MED ORDER — LORAZEPAM 2 MG/ML IJ SOLN
1.0000 mg | INTRAMUSCULAR | Status: DC | PRN
  Administered 2022-08-30: 2 mg via INTRAVENOUS
  Filled 2022-08-30: qty 1

## 2022-08-30 MED ORDER — LORAZEPAM 1 MG PO TABS
1.0000 mg | ORAL_TABLET | Freq: Once | ORAL | Status: AC
  Administered 2022-08-30: 1 mg via ORAL
  Filled 2022-08-30: qty 1

## 2022-08-30 NOTE — ED Provider Notes (Signed)
[facility] DEPT Provider Note   CSN: [id number] Arrival date & time: 08/29/22  2326     History {Add pertinent medical, surgical, social history, OB history to HPI:1} Chief Complaint  Patient presents with   Constipation   Abdominal Pain    Brad Brad [name] is a 67 y.o. Brad [name].  67 year old Brad [name] with a history of anxiety on Ativan and incarcerated left inguinal hernia status postrepair on 08/20/2022 who presents the emergency department with abdominal pain and constipation.  Says that since having his hernia repaired he has had severe constipation.  Last bowel movement was on Sunday and has had decreased flatus since then.  Says that he was straining to have a bowel movement after giving himself an enema when he started experiencing severe left lower quadrant abdominal pain.  Also noticed bruising this morning in his left lower quadrant around the surgical site.  Denies any fevers.  Has had nausea but no vomiting.  Says he has been taking oxycodone for the pain.  Has not been taking his Ativan recently due to his symptoms and thinks that he may be anxious due to that.  Also reports decreased urination.  Denies any heavy EtOH use or recreational substance use.       Home Medications Prior to Admission medications   Medication Sig Start Date End Date Taking? Authorizing Provider  acetaminophen (TYLENOL) 500 MG tablet Take 2 tablets (1,000 mg total) by mouth every 8 (eight) hours. 08/27/22 09/26/22  Kommor, Madison, MD  Alpha-Lipoic Acid 600 MG CAPS Take 1 capsule by mouth daily.    [provider]  amLODipine (NORVASC) 5 MG tablet Take 2 tablets (10 mg total) by mouth daily. Patient taking differently: Take 5 mg by mouth daily. 03/02/19   Johnn Hai, MD  atorvastatin (LIPITOR) 10 MG tablet Take 10 mg by mouth 3 (three) times a week. MWF 08/10/19   [provider]  busPIRone (BUSPAR) 10 MG tablet Take 20 mg by mouth 3 (three) times daily.      [provider]  Cholecalciferol (VITAMIN D3) 125 MCG (5000 UT) TABS Take 1 tablet by mouth daily.    [provider]  Coenzyme Q10 50 MG CAPS Take 1 capsule by mouth daily.    [provider]  doxepin (SINEQUAN) 25 MG capsule Take 25 mg by mouth at bedtime.    [provider]  LORazepam (ATIVAN) 1 MG tablet Take 1-2 mg by mouth 2 (two) times daily. Take 1 tablet in the morning and Take 2 tablets at bedtime 10/26/18   [provider]  losartan (COZAAR) 100 MG tablet Take 100 mg by mouth daily. 02/04/20   [provider]  memantine (NAMENDA) 10 MG tablet Take 1 tablet (10 mg total) by mouth 2 (two) times daily. Patient not taking: Reported on 08/20/2022 03/02/19   Johnn Hai, MD  Methylfol-Methylcob-Acetylcyst (CEREFOLIN NAC) 6-2-600 MG TABS 1 a day Patient not taking: Reported on 08/20/2022 03/02/19   Johnn Hai, MD  metoprolol tartrate (LOPRESSOR) 25 MG tablet Take 25 mg by mouth 2 (two) times daily. Per Pt he "restarted it" as his MD had stopped it this week    [provider]  mirtazapine (REMERON) 30 MG tablet Take 30 mg by mouth at bedtime.    [provider]  Multiple Vitamin (MULTIVITAMIN) tablet Take 1 tablet by mouth daily.    [provider]  Omega-3 1000 MG CAPS Take 1 capsule by mouth daily.    [provider]  oxyCODONE (OXY IR/ROXICODONE) 5 MG immediate release tablet Take 1 tablet (5 mg total) by mouth every 6 (six) hours as needed for severe pain or breakthrough pain. 08/23/22   Karie Soda, MD  polyethylene glycol (MIRALAX) 17 g packet Take 17 g by mouth daily. 08/26/22   Rancour, Jeannett Senior, MD  pregabalin (LYRICA) 75 MG capsule Take 75 mg by mouth 2 (two) times daily.    [provider]  Prenatal Vit-Fe Fumarate-FA (PRENATAL MULTIVITAMIN) TABS tablet Take 1 tablet by mouth daily. Patient not taking: Reported on 08/20/2022 03/03/19   Malvin Johns, MD  traZODone (DESYREL) 50 MG tablet Take  50 mg by mouth at bedtime.    [provider]  zinc gluconate 50 MG tablet Take 50 mg by mouth daily.    [provider]  zolpidem (AMBIEN CR) 12.5 MG CR tablet Take 12.5 mg by mouth at bedtime.     [provider]      Allergies    Gabapentin and Trileptal [oxcarbazepine]    Review of Systems   Review of Systems  Physical Exam Updated Vital Signs BP (!) 161/93   Pulse 72   Temp 97.8 F (36.6 C) (Oral)   Resp 18   SpO2 95%  Physical Exam Vitals and nursing note reviewed.  Constitutional:      General: He is not in acute distress.    Appearance: He is well-developed.     Comments: Anxious appearing.  Very uncomfortable appearing.  Tremor noted.  HENT:     Head: Normocephalic and atraumatic.     Right Ear: External ear normal.     Left Ear: External ear normal.     Nose: Nose normal.  Eyes:     Extraocular Movements: Extraocular movements intact.     Conjunctiva/sclera: Conjunctivae normal.     Pupils: Pupils are equal, round, and reactive to light.  Cardiovascular:     Rate and Rhythm: Normal rate and regular rhythm.     Heart sounds: Normal heart sounds.  Pulmonary:     Effort: Pulmonary effort is normal. No respiratory distress.     Breath sounds: Normal breath sounds.  Abdominal:     General: There is no distension.     Palpations: Abdomen is soft. There is no mass.     Tenderness: There is abdominal tenderness (Diffuse worse in left lower quadrant). There is guarding (Involuntary diffuse).  Genitourinary:    Penis: Normal.      Testes: Normal.     Comments: Bruising and left groin or down left leg.  Surgical incision site appears clean dry and intact.  Mild amount of swelling in the area.  Patient refusing palpation to that location. Musculoskeletal:     Cervical back: Normal range of motion and neck supple.  Skin:    General: Skin is warm and dry.  Neurological:     Mental Status: He is alert.  Psychiatric:     Comments: Tremulous  and anxious     ED Results / Procedures / Treatments   Labs (all labs ordered are listed, but only abnormal results are displayed) Labs Reviewed  CBC WITH DIFFERENTIAL/PLATELET  COMPREHENSIVE METABOLIC PANEL  LIPASE, BLOOD  URINALYSIS, ROUTINE W REFLEX MICROSCOPIC    EKG None  Radiology No results found.  Procedures Procedures   Medications Ordered in ED Medications - No data to display  ED Course/ Medical Decision Making/ A&P  Medical Decision Making Amount and/or Complexity of Data Reviewed Labs: ordered. Radiology: ordered.  Risk Prescription drug management.   Brad Brad [name] is a 67 y.o. Brad [name] with comorbidities that complicate the patient evaluation including anxiety on Ativan and incarcerated left inguinal hernia status postrepair on 08/20/2022 who presents the emergency department with abdominal pain and constipation.    Initial Ddx:  Bowel obstruction, constipation, hernia repair failure, Ativan withdrawal  MDM:  The patient may have constipation due to his chronic constipation as well as opiate use.  Concerned that straining may have caused his hernia repair to fail.  Given his significant tenderness to palpation on exam will obtain labs including lactate to ensure that his hernia does not become strangulated.  Not having significant vomiting that would suggest bowel obstruction but CT will also evaluate for this.  Appears to perhaps have Ativan withdrawal although this confounded by the amount of pain that he is on so we will treat his pain and give him Ativan and reassess his CIWA score afterwards.  Plan:  Labs Lactate CT abdomen pelvis IV contrast Morphine Zofran Fluids Ativan  ED Summary/Re-evaluation:  ***  This patient presents to the ED for concern of complaints listed in HPI, this involves an extensive number of treatment options, and is a complaint that carries with it a high risk of complications and morbidity.  Disposition including potential need for admission considered.   Dispo: {Disposition:28069}  Additional history obtained from {Additional History:28067} Records reviewed {Records Reviewed:28068} The following labs were independently interpreted: {labs interpreted:28064} and show {lab findings:28250} I independently reviewed the following imaging with scope of interpretation limited to determining acute life threatening conditions related to emergency care: {imaging interpreted:28065} and agree with the radiologist interpretation with the following exceptions: *** I personally reviewed and interpreted cardiac monitoring: {cardiac monitoring:28251} I personally reviewed and interpreted the pt's EKG: see above for interpretation  I have reviewed the patients home medications and made adjustments as needed Consults: {Consultants:28063} Social Determinants of health:  ***  Final Clinical Impression(s) / ED Diagnoses Final diagnoses:  None    Rx / DC Orders ED Discharge Orders     None

## 2022-08-30 NOTE — Discharge Instructions (Signed)
You were seen for for your abdominal pain in the emergency department.   At home, please take the pain medications you have been prescribed for your abdominal pain.  Please continue MiraLAX and senna for your constipation.    Check your MyChart online for the results of any tests that had not resulted by the time you left the emergency department.   Follow-up with your primary doctor in 2-3 days regarding your visit and to discuss your Ativan prescription.  Please follow-up with your general surgeon soon as possible regarding your surgery.  Return immediately to the emergency department if you experience any of the following: Worsening pain, vomiting, or any other concerning symptoms.    Thank you for visiting our Emergency Department. It was a pleasure taking care of you today.

## 2022-08-30 NOTE — ED Notes (Signed)
Patient continues to walk in and out of triage room.  Reports "it hurts too much to sit down.  I need to stand up."  Pt directed to room and told he may stand in room.  Patient continues to demand to go "back to a room and not the waiting room." Pt informed of triage process.  When instructed to wait in lobby, patient did not want to leave triage area.  Patient had to be directed multiple times to wait in lobby.

## 2022-09-03 ENCOUNTER — Emergency Department (HOSPITAL_COMMUNITY)
Admission: EM | Admit: 2022-09-03 | Discharge: 2022-09-04 | Disposition: A | Discharge status: Home / Self Care | Payer: Medicare Other | Attending: Emergency Medicine | Admitting: Emergency Medicine

## 2022-09-03 ENCOUNTER — Emergency Department (HOSPITAL_COMMUNITY): Payer: Medicare Other

## 2022-09-03 ENCOUNTER — Other Ambulatory Visit: Payer: Self-pay

## 2022-09-03 DIAGNOSIS — Z1152 Encounter for screening for COVID-19: Secondary | ICD-10-CM | POA: Insufficient documentation

## 2022-09-03 DIAGNOSIS — I10 Essential (primary) hypertension: Secondary | ICD-10-CM | POA: Insufficient documentation

## 2022-09-03 DIAGNOSIS — Z20822 Contact with and (suspected) exposure to covid-19: Secondary | ICD-10-CM | POA: Diagnosis not present

## 2022-09-03 DIAGNOSIS — Z79899 Other long term (current) drug therapy: Secondary | ICD-10-CM | POA: Insufficient documentation

## 2022-09-03 DIAGNOSIS — K5901 Slow transit constipation: Secondary | ICD-10-CM | POA: Insufficient documentation

## 2022-09-03 DIAGNOSIS — K5900 Constipation, unspecified: Secondary | ICD-10-CM | POA: Diagnosis present

## 2022-09-03 LAB — URINALYSIS, ROUTINE W REFLEX MICROSCOPIC
Bilirubin Urine: NEGATIVE
Glucose, UA: NEGATIVE mg/dL
Hgb urine dipstick: NEGATIVE
Ketones, ur: NEGATIVE mg/dL
Leukocytes,Ua: NEGATIVE
Nitrite: NEGATIVE
Protein, ur: NEGATIVE mg/dL
Specific Gravity, Urine: 1.015 (ref 1.005–1.030)
pH: 6 (ref 5.0–8.0)

## 2022-09-03 LAB — CBC WITH DIFFERENTIAL/PLATELET
Abs Immature Granulocytes: 0.02 10*3/uL (ref 0.00–0.07)
Basophils Absolute: 0.1 10*3/uL (ref 0.0–0.1)
Basophils Relative: 1 %
Eosinophils Absolute: 0.1 10*3/uL (ref 0.0–0.5)
Eosinophils Relative: 1 %
HCT: 38.1 % — ABNORMAL LOW (ref 39.0–52.0)
Hemoglobin: 13.3 g/dL (ref 13.0–17.0)
Immature Granulocytes: 0 %
Lymphocytes Relative: 29 %
Lymphs Abs: 1.8 10*3/uL (ref 0.7–4.0)
MCH: 31.5 pg (ref 26.0–34.0)
MCHC: 34.9 g/dL (ref 30.0–36.0)
MCV: 90.3 fL (ref 80.0–100.0)
Monocytes Absolute: 0.4 10*3/uL (ref 0.1–1.0)
Monocytes Relative: 7 %
Neutro Abs: 3.9 10*3/uL (ref 1.7–7.7)
Neutrophils Relative %: 62 %
Platelets: 247 10*3/uL (ref 150–400)
RBC: 4.22 MIL/uL (ref 4.22–5.81)
RDW: 13.7 % (ref 11.5–15.5)
WBC: 6.3 10*3/uL (ref 4.0–10.5)
nRBC: 0 % (ref 0.0–0.2)

## 2022-09-03 LAB — COMPREHENSIVE METABOLIC PANEL
ALT: 20 U/L (ref 0–44)
AST: 28 U/L (ref 15–41)
Albumin: 4.1 g/dL (ref 3.5–5.0)
Alkaline Phosphatase: 63 U/L (ref 38–126)
Anion gap: 9 (ref 5–15)
BUN: 14 mg/dL (ref 8–23)
CO2: 25 mmol/L (ref 22–32)
Calcium: 9.1 mg/dL (ref 8.9–10.3)
Chloride: 102 mmol/L (ref 98–111)
Creatinine, Ser: 1.03 mg/dL (ref 0.61–1.24)
GFR, Estimated: >60 mL/min (ref >60)
Glucose, Bld: 133 mg/dL — ABNORMAL HIGH (ref 70–99)
Potassium: 3.4 mmol/L — ABNORMAL LOW (ref 3.5–5.1)
Sodium: 136 mmol/L (ref 135–145)
Total Bilirubin: 0.6 mg/dL (ref 0.3–1.2)
Total Protein: 6.4 g/dL — ABNORMAL LOW (ref 6.5–8.1)

## 2022-09-03 LAB — RESP PANEL BY RT-PCR (RSV, FLU A&B, COVID)  RVPGX2
Influenza A by PCR: NEGATIVE
Influenza B by PCR: NEGATIVE
Resp Syncytial Virus by PCR: NEGATIVE
SARS Coronavirus 2 by RT PCR: NEGATIVE

## 2022-09-03 LAB — LIPASE, BLOOD: Lipase: 46 U/L (ref 11–51)

## 2022-09-03 MED ORDER — ONDANSETRON 4 MG PO TBDP
8.0000 mg | ORAL_TABLET | Freq: Once | ORAL | Status: AC
  Administered 2022-09-03: 8 mg via ORAL
  Filled 2022-09-03: qty 2

## 2022-09-03 MED ORDER — HYDROCODONE-ACETAMINOPHEN 5-325 MG PO TABS
1.0000 | ORAL_TABLET | Freq: Once | ORAL | Status: AC
  Administered 2022-09-03: 1 via ORAL
  Filled 2022-09-03 (×2): qty 1

## 2022-09-03 NOTE — ED Provider Triage Note (Signed)
Emergency Medicine Provider Triage Evaluation Note  Brad [name] , a 67 y.o. male  was evaluated in triage.  Pt complains of constipation and requesting COVID swab.  Patient reports no bowel movement in the past 7 to 10 days but also states that he has been eating minimally.  Patient with history of left-sided incarcerated hernia which was operated on 12/22.  He states that since then he has had difficulty with producing bowel movements.  Has tried at home enemas, laxatives intermittently of which have helped minimally.  Denies fever, chills, night sweats, nausea, vomiting, urinary symptoms.  Review of Systems  Positive: See above Negative:   Physical Exam  BP 127/83   Pulse 79   Temp (!) 97.5 F (36.4 C)   Resp 16   SpO2 99%  Gen:   Awake, no distress   Resp:  Normal effort  MSK:   Moves extremities without difficulty  Other:  Surgical site intact with no obvious erythema, palpable induration.  Patient states that area looks the same as the day after surgery.  Medical Decision Making  Medically screening exam initiated at 9:02 PM.  Appropriate orders placed.  Elridge C Gibas was informed that the remainder of the evaluation will be completed by another provider, this initial triage assessment does not replace that evaluation, and the importance of remaining in the ED until their evaluation is complete.     Wilnette Kales, [place] 09/03/22 2106

## 2022-09-03 NOTE — ED Triage Notes (Signed)
Patient arrived with EMS from [facility] clinic reports persistent constipation for several days with LLQ pain , received Fentanyl 100 mcg IV by EMS prior to arrival .

## 2022-09-03 NOTE — ED Notes (Signed)
Pt stating he wants to leave due to not wanting to wait. Pt convinced to go through triage first at this time

## 2022-09-04 ENCOUNTER — Encounter (HOSPITAL_COMMUNITY): Payer: Self-pay

## 2022-09-04 NOTE — ED Provider Notes (Signed)
[facility] EMERGENCY DEPARTMENT Provider Note   CSN: [id number] Arrival date & time: 09/03/22  2002     History  Chief Complaint  Patient presents with   Abdominal Pain   Constipation    Brad [name] is a 67 y.o. male.  The history is provided by the patient.  Patient presents for constipation.  Patient reports this is been a chronic issue.  He reports he has not had a bowel movement in 10 days.  He reports he had left-sided hernia surgery last month.  He reports at that time he had very minimal stool output.  He is passing flatus.  No fevers or vomiting.  He reports pain and swelling over the surgical site.    Past Medical History:  Diagnosis Date   Allergy    Anxiety    Depression    DJD (degenerative joint disease), cervical    postiton with pillow under knees, cant turn neck    Dysentery, amebic, acute 1981   GERD (gastroesophageal reflux disease)    H/O bronchitis    H/O malaria 1984   Hearing loss    bilateral    Hypertension    labile Blood pressure   Inguinal hernia    Insomnia    early morning awakening   MVP (mitral valve prolapse)    "no problems"   Perianal pain    Personal history of colonic polyps    Tinnitus    right ear    Home Medications Prior to Admission medications   Medication Sig Start Date End Date Taking? Authorizing Provider  acetaminophen (TYLENOL) 500 MG tablet Take 2 tablets (1,000 mg total) by mouth every 8 (eight) hours. 08/27/22 09/26/22  Kommor, Madison, MD  Alpha-Lipoic Acid 600 MG CAPS Take 1 capsule by mouth daily.    [provider]  amLODipine (NORVASC) 5 MG tablet Take 2 tablets (10 mg total) by mouth daily. Patient taking differently: Take 5 mg by mouth daily. 03/02/19   Malvin Johns, MD  atorvastatin (LIPITOR) 10 MG tablet Take 10 mg by mouth 3 (three) times a week. MWF 08/10/19   [provider]  busPIRone (BUSPAR) 10 MG tablet Take 20 mg by mouth 3 (three) times daily.     [provider]  Cholecalciferol (VITAMIN D3) 125 MCG (5000 UT) TABS Take 1 tablet by mouth daily.    [provider]  Coenzyme Q10 50 MG CAPS Take 1 capsule by mouth daily.    [provider]  doxepin (SINEQUAN) 25 MG capsule Take 25 mg by mouth at bedtime.    [provider]  LORazepam (ATIVAN) 1 MG tablet Take 1-2 mg by mouth 2 (two) times daily. Take 1 tablet in the morning and Take 2 tablets at bedtime 10/26/18   [provider]  losartan (COZAAR) 100 MG tablet Take 100 mg by mouth daily. 02/04/20   [provider]  memantine (NAMENDA) 10 MG tablet Take 1 tablet (10 mg total) by mouth 2 (two) times daily. Patient not taking: Reported on 08/20/2022 03/02/19   Malvin Johns, MD  Methylfol-Methylcob-Acetylcyst (CEREFOLIN NAC) 6-2-600 MG TABS 1 a day Patient not taking: Reported on 08/20/2022 03/02/19   Malvin Johns, MD  metoprolol tartrate (LOPRESSOR) 25 MG tablet Take 25 mg by mouth 2 (two) times daily. Per Pt he "restarted it" as his MD had stopped it this week    [provider]  mirtazapine (REMERON) 30 MG tablet Take 30 mg by mouth at bedtime.  [provider]  Multiple Vitamin (MULTIVITAMIN) tablet Take 1 tablet by mouth daily.    [provider]  Omega-3 1000 MG CAPS Take 1 capsule by mouth daily.    [provider]  oxyCODONE (OXY IR/ROXICODONE) 5 MG immediate release tablet Take 1 tablet (5 mg total) by mouth every 6 (six) hours as needed for severe pain or breakthrough pain. 08/23/22   Michael Boston, MD  polyethylene glycol (MIRALAX) 17 g packet Take 17 g by mouth daily. 08/26/22   Rancour, Annie Main, MD  pregabalin (LYRICA) 75 MG capsule Take 75 mg by mouth 2 (two) times daily.    [provider]  Prenatal Vit-Fe Fumarate-FA (PRENATAL MULTIVITAMIN) TABS tablet Take 1 tablet by mouth daily. Patient not taking: Reported on 08/20/2022 03/03/19   Johnn Hai, MD  traZODone (DESYREL) 50 MG tablet Take 50 mg by  mouth at bedtime.    [provider]  zinc gluconate 50 MG tablet Take 50 mg by mouth daily.    [provider]  zolpidem (AMBIEN CR) 12.5 MG CR tablet Take 12.5 mg by mouth at bedtime.     [provider]      Allergies    Gabapentin and Trileptal [oxcarbazepine]    Review of Systems   Review of Systems  Gastrointestinal:  Positive for constipation. Negative for vomiting.  Psychiatric/Behavioral:  The patient is nervous/anxious.     Physical Exam Updated Vital Signs BP (!) 141/96   Pulse 64   Temp 98 F (36.7 C) (Oral)   Resp 17   SpO2 100%  Physical Exam CONSTITUTIONAL: Well developed/well nourished, anxious HEAD: Normocephalic/atraumatic EYES: EOMI ABDOMEN: soft, nontender, no rebound or guarding, bowel sounds noted throughout abdomen GU: Normal external genitalia.  There is no scrotal swelling or erythema. Mild postoperative bruising is noted in the left inguinal region.  No large hematoma.  No thrill is palpated.  No erythema.  Nurse Ander Purpura present for exam Rectal-no stool impaction.  No rectal mass.  Nurse Ander Purpura present for exam.  Patient gave consent for exam NEURO: Pt is awake/alert/appropriate, moves all extremitiesx4.  No facial droop.   EXTREMITIES: pulses normal/equal in both lower extremities, full ROM SKIN: warm, color normal PSYCH: Patient is very anxious  ED Results / Procedures / Treatments   Labs (all labs ordered are listed, but only abnormal results are displayed) Labs Reviewed  CBC WITH DIFFERENTIAL/PLATELET - Abnormal; Notable for the following components:      Result Value   HCT 38.1 (*)    All other components within normal limits  URINALYSIS, ROUTINE W REFLEX MICROSCOPIC - Abnormal; Notable for the following components:   APPearance CLOUDY (*)    All other components within normal limits  COMPREHENSIVE METABOLIC PANEL - Abnormal; Notable for the following components:   Potassium 3.4 (*)    Glucose, Bld 133 (*)     Total Protein 6.4 (*)    All other components within normal limits  RESP PANEL BY RT-PCR (RSV, FLU A&B, COVID)  RVPGX2  LIPASE, BLOOD    EKG None  Radiology DG Abdomen 1 View  Result Date: 09/03/2022 CLINICAL DATA:  Constipation x7 days. EXAM: ABDOMEN - 1 VIEW COMPARISON:  August 09, 2022 FINDINGS: The bowel gas pattern is normal. A large amount of stool is seen throughout the colon. This is most prominent within the descending and sigmoid colon. No radio-opaque calculi or other significant radiographic abnormality are seen. IMPRESSION: Large stool burden without evidence of bowel obstruction. Electronically Signed  By: Aram Candela M.D.   On: 09/03/2022 21:23    Procedures Procedures    Medications Ordered in ED Medications  ondansetron (ZOFRAN-ODT) disintegrating tablet 8 mg (8 mg Oral Given 09/03/22 2109)  HYDROcodone-acetaminophen (NORCO/VICODIN) 5-325 MG per tablet 1 tablet (1 tablet Oral Given 09/03/22 2109)    ED Course/ Medical Decision Making/ A&P                           Medical Decision Making  Patient presents for evaluation for constipation.  Overall his labs were reviewed and are unremarkable. I personally evaluated his abdominal x-ray which reveals constipation but no other acute findings  This is the patient's seventh ER visit in 6 months. Since his discharge from the hospital on Christmas Day, this is his third ER visit for continued constipation.  He is also seeing general surgery for the constipation.  Patient has also had multiple CT scans.  At this time he has no signs of acute obstruction.  He has no stool impaction on exam.  Patient appears to have severe anxiety about his constipation.  This has been a chronic problem in the setting of chronic pain. Patient admits he has not been taking all the medications that were discussed with previous outpatient surgical visit At this time there is no indication for further imaging or admission.  Patient is to  take all the medications that were prescribed. Overall his surgical site is healing well without any signs of dehiscence or infection        Final Clinical Impression(s) / ED Diagnoses Final diagnoses:  Slow transit constipation    Rx / DC Orders ED Discharge Orders     None         Zadie Rhine, MD 09/04/22 [phone]

## 2022-09-04 NOTE — ED Notes (Signed)
At this time, patient is refusing to leave. He continues to state "My hernia surgery site is so swollen. This isn't normal. I can't take my medicine because I don't have a fridge." Dr. Christy Gentles is at bedside and discussing discharge instructions in detail. Social work consult was put in due to [place] not working by [employer], MD. He explains to the patient that he needs to take his prescribed medication that the surgeons discharged him with. Patient ignores instructions given by both MD and this RN. Charge RN at bedside to help assist with discharge.

## 2022-09-04 NOTE — Discharge Instructions (Addendum)
There are the instructions from the surgeon:  Go to [facility] and buy Fleet enemas (2) and a box of Dulcolax suppositories.   Drink a cap full of Miralax daily with coffee and chase it with two full glass of water. Buy some Activia yogurt and eat one every morning.  Use the enemas and Dulcolax suppositories to have a bowel every day--every other day at the latest.

## 2022-09-04 NOTE — ED Provider Notes (Signed)
Patient still very anxious.  He reports his fridge has been out for several days and his landlord will not fix it.  He also reports his phone is not working.  He reports he feels dehydrated.  He is awake and alert, he is drinking fluids without any difficulty.  His vital signs are appropriate.  His labs are overall unremarkable.  I advised him that he needs to call his landlord back to help with his fridge.  I have also put a consult in for social work.  At this time there is no indication for further workup as he is already had 2 CT abdomen pelvis scans since his surgery that were unremarkable.  X-ray today shows constipation.  He has no clinical signs of acute obstruction  I reassured patient that he is safe for outpatient management and to continue all of his meds to help with his constipation.  He then reports he has had 30 pound weight loss over several months.  Advised him to call his PCP about this   Ripley Fraise, MD 09/04/22 [phone]

## 2022-09-10 ENCOUNTER — Emergency Department (HOSPITAL_COMMUNITY): Payer: Medicare Other

## 2022-09-10 ENCOUNTER — Emergency Department (HOSPITAL_COMMUNITY)
Admission: EM | Admit: 2022-09-10 | Discharge: 2022-09-10 | Disposition: A | Discharge status: Home / Self Care | Payer: Medicare Other | Attending: Emergency Medicine | Admitting: Emergency Medicine

## 2022-09-10 DIAGNOSIS — F419 Anxiety disorder, unspecified: Secondary | ICD-10-CM | POA: Insufficient documentation

## 2022-09-10 DIAGNOSIS — I10 Essential (primary) hypertension: Secondary | ICD-10-CM | POA: Insufficient documentation

## 2022-09-10 DIAGNOSIS — R1030 Lower abdominal pain, unspecified: Secondary | ICD-10-CM

## 2022-09-10 DIAGNOSIS — Z79899 Other long term (current) drug therapy: Secondary | ICD-10-CM | POA: Insufficient documentation

## 2022-09-10 DIAGNOSIS — R1031 Right lower quadrant pain: Secondary | ICD-10-CM | POA: Insufficient documentation

## 2022-09-10 DIAGNOSIS — R1032 Left lower quadrant pain: Secondary | ICD-10-CM | POA: Insufficient documentation

## 2022-09-10 LAB — TROPONIN I (HIGH SENSITIVITY)
Troponin I (High Sensitivity): 5 ng/L (ref <18)
Troponin I (High Sensitivity): 6 ng/L (ref <18)

## 2022-09-10 LAB — CBC
HCT: 39.5 % (ref 39.0–52.0)
Hemoglobin: 13.6 g/dL (ref 13.0–17.0)
MCH: 31.1 pg (ref 26.0–34.0)
MCHC: 34.4 g/dL (ref 30.0–36.0)
MCV: 90.2 fL (ref 80.0–100.0)
Platelets: 235 10*3/uL (ref 150–400)
RBC: 4.38 MIL/uL (ref 4.22–5.81)
RDW: 13.4 % (ref 11.5–15.5)
WBC: 6 10*3/uL (ref 4.0–10.5)
nRBC: 0 % (ref 0.0–0.2)

## 2022-09-10 LAB — URINALYSIS, ROUTINE W REFLEX MICROSCOPIC
Bilirubin Urine: NEGATIVE
Glucose, UA: NEGATIVE mg/dL
Hgb urine dipstick: NEGATIVE
Ketones, ur: NEGATIVE mg/dL
Leukocytes,Ua: NEGATIVE
Nitrite: NEGATIVE
Protein, ur: NEGATIVE mg/dL
Specific Gravity, Urine: 1.006 (ref 1.005–1.030)
pH: 6 (ref 5.0–8.0)

## 2022-09-10 LAB — COMPREHENSIVE METABOLIC PANEL
ALT: 23 U/L (ref 0–44)
AST: 27 U/L (ref 15–41)
Albumin: 4.4 g/dL (ref 3.5–5.0)
Alkaline Phosphatase: 65 U/L (ref 38–126)
Anion gap: 11 (ref 5–15)
BUN: 10 mg/dL (ref 8–23)
CO2: 24 mmol/L (ref 22–32)
Calcium: 9.6 mg/dL (ref 8.9–10.3)
Chloride: 99 mmol/L (ref 98–111)
Creatinine, Ser: 0.93 mg/dL (ref 0.61–1.24)
GFR, Estimated: >60 mL/min (ref >60)
Glucose, Bld: 109 mg/dL — ABNORMAL HIGH (ref 70–99)
Potassium: 4.1 mmol/L (ref 3.5–5.1)
Sodium: 134 mmol/L — ABNORMAL LOW (ref 135–145)
Total Bilirubin: 0.6 mg/dL (ref 0.3–1.2)
Total Protein: 6.8 g/dL (ref 6.5–8.1)

## 2022-09-10 LAB — LIPASE, BLOOD: Lipase: 40 U/L (ref 11–51)

## 2022-09-10 MED ORDER — MORPHINE SULFATE (PF) 4 MG/ML IV SOLN
4.0000 mg | Freq: Once | INTRAVENOUS | Status: AC
  Administered 2022-09-10: 4 mg via INTRAVENOUS
  Filled 2022-09-10: qty 1

## 2022-09-10 MED ORDER — ONDANSETRON HCL 4 MG/2ML IJ SOLN
4.0000 mg | Freq: Once | INTRAMUSCULAR | Status: AC
  Administered 2022-09-10: 4 mg via INTRAVENOUS
  Filled 2022-09-10: qty 2

## 2022-09-10 MED ORDER — LACTATED RINGERS IV BOLUS
2000.0000 mL | Freq: Once | INTRAVENOUS | Status: AC
  Administered 2022-09-10: 2000 mL via INTRAVENOUS

## 2022-09-10 MED ORDER — IOHEXOL 350 MG/ML SOLN
100.0000 mL | Freq: Once | INTRAVENOUS | Status: AC | PRN
  Administered 2022-09-10: 100 mL via INTRAVENOUS

## 2022-09-10 NOTE — Discharge Instructions (Signed)
Your workup today is overall reassuring.  I recommend that you stick with the bowel regimen as discussed including MiraLAX 3 times a day, Senokot and enema as you need to.  MiraLAX 3 times a day is crucial until you get the desired effect on your bowel movements.  Please follow-up with your gastroenterologist.  Return for any concerning symptoms.  CT scan did not show any concerning findings.  Blood work was overall reassuring.  Heart enzyme was normal.  No indication of heart attack today.

## 2022-09-10 NOTE — ED Provider Triage Note (Signed)
Emergency Medicine Provider Triage Evaluation Note  Brad [name] , a 67 y.o. male  was evaluated in triage.  Pt complains of severe abdominal pain in the left lower quadrant now radiating to right lower quadrant, experiencing significant constipation, reports he has been hypotensive at [place] [facility] and also last week at urgent care.  He had a hernia pair back in December, reports it feels very swollen.  He also reports that he swallowed some hot soup last night and that his throat is burning, he is tolerating his own secretions.  Denies any chest pain, shortness of breath.  Review of Systems  Positive: Abdominal pain, weakness, dysphagia, constipation Negative: Fever, chills, nausea, vomiting  Physical Exam  SpO2 96%  Gen:   Awake, anxious, distressed Resp:  Normal effort  MSK:   Moves extremities without difficulty  Other:  Focal TTP LLQ radiating to RLQ, some guarding, no rebound rigidity  Medical Decision Making  Medically screening exam initiated at 2:51 PM.  Appropriate orders placed.  Brad [name] was informed that the remainder of the evaluation will be completed by another provider, this initial triage assessment does not replace that evaluation, and the importance of remaining in the ED until their evaluation is complete.  Workup initiated   Anselmo Pickler, [place] 09/10/22 1451

## 2022-09-10 NOTE — ED Notes (Signed)
Attempted to do pt EKG. Pt very anxious and refusing to do EKG at this time.

## 2022-09-10 NOTE — ED Notes (Signed)
Patient is complaining of burning and hurting at IV site after morphine and zofran administration. Before administration IV gave good blood return and flushed great. Patient then got up went to restroom came back with 3 red streaks to arm. Notified MD marked the red streaks and took IV out. Will continue to monitor.

## 2022-09-10 NOTE — ED Triage Notes (Signed)
[facility] c/o LLQ pain that is now radiating to the RLQ. Pt is also experiencing some constipation. Pt also had an [facility] of [facility] so [facility]. Pt had a hernia repair back in December. Pt is also c/o throat pain but states he ate some very hot soup last night and burnt his throat.

## 2022-09-10 NOTE — ED Notes (Signed)
Just stopped fluids patient kept complaining of the coldness from the fluids even with new IV. Gave patient water PO

## 2022-09-10 NOTE — ED Provider Notes (Signed)
[facility] [address] EMERGENCY DEPARTMENT Provider Note   CSN: [id number] Arrival date & time: 09/10/22  1420     History  Chief Complaint  Patient presents with   Abdominal Pain   Constipation    Brad [name] is a 67 y.o. male.  67 year old male with recent hernia repair in December presents today for evaluation of lower abdominal pain that initially started on the left lower quadrant and male involves the right lower quadrant as well.  He was seen at his PCP office and was noted to be hypotensive in addition to his previous complaints.  They recommended he come into the emergency department to be evaluated.  Since arriving to the emergency department he has remained normotensive.  He does appear anxious on exam.  He states he had episode of chest pain, paresthesias in the left arm last night following eating some very hot soup that burned his throat.  Currently denies symptoms of chest pain, shortness of breath, lightheadedness, or palpitations.  States he drinks about 30 ounces of water per day.  States his last bowel movement was about 1.5 weeks ago.  He is taken [place], MiraLAX, Senokot but states he could do a better job at being more consistent.  Per patient his systolic pressure at PCP office was 76.  The history is provided by the patient. No language interpreter was used.       Home Medications Prior to Admission medications   Medication Sig Start Date End Date Taking? Authorizing Provider  acetaminophen (TYLENOL) 500 MG tablet Take 2 tablets (1,000 mg total) by mouth every 8 (eight) hours. 08/27/22 09/26/22  Kommor, Madison, MD  Alpha-Lipoic Acid 600 MG CAPS Take 1 capsule by mouth daily.    [provider]  amLODipine (NORVASC) 5 MG tablet Take 2 tablets (10 mg total) by mouth daily. Patient taking differently: Take 5 mg by mouth daily. 03/02/19   Malvin Johns, MD  atorvastatin (LIPITOR) 10 MG tablet Take 10 mg by mouth 3 (three) times a week. MWF 08/10/19    [provider]  busPIRone (BUSPAR) 10 MG tablet Take 20 mg by mouth 3 (three) times daily.     [provider]  Cholecalciferol (VITAMIN D3) 125 MCG (5000 UT) TABS Take 1 tablet by mouth daily.    [provider]  Coenzyme Q10 50 MG CAPS Take 1 capsule by mouth daily.    [provider]  doxepin (SINEQUAN) 25 MG capsule Take 25 mg by mouth at bedtime.    [provider]  LORazepam (ATIVAN) 1 MG tablet Take 1-2 mg by mouth 2 (two) times daily. Take 1 tablet in the morning and Take 2 tablets at bedtime 10/26/18   [provider]  losartan (COZAAR) 100 MG tablet Take 100 mg by mouth daily. 02/04/20   [provider]  memantine (NAMENDA) 10 MG tablet Take 1 tablet (10 mg total) by mouth 2 (two) times daily. Patient not taking: Reported on 08/20/2022 03/02/19   Malvin Johns, MD  Methylfol-Methylcob-Acetylcyst (CEREFOLIN NAC) 6-2-600 MG TABS 1 a day Patient not taking: Reported on 08/20/2022 03/02/19   Malvin Johns, MD  metoprolol tartrate (LOPRESSOR) 25 MG tablet Take 25 mg by mouth 2 (two) times daily. Per Pt he "restarted it" as his MD had stopped it this week    [provider]  mirtazapine (REMERON) 30 MG tablet Take 30 mg by mouth at bedtime.    [provider]  Multiple Vitamin (MULTIVITAMIN) tablet Take 1  tablet by mouth daily.    [provider]  Omega-3 1000 MG CAPS Take 1 capsule by mouth daily.    [provider]  oxyCODONE (OXY IR/ROXICODONE) 5 MG immediate release tablet Take 1 tablet (5 mg total) by mouth every 6 (six) hours as needed for severe pain or breakthrough pain. 08/23/22   Karie Soda, MD  polyethylene glycol (MIRALAX) 17 g packet Take 17 g by mouth daily. 08/26/22   Rancour, Jeannett Senior, MD  pregabalin (LYRICA) 75 MG capsule Take 75 mg by mouth 2 (two) times daily.    [provider]  Prenatal Vit-Fe Fumarate-FA (PRENATAL MULTIVITAMIN) TABS tablet Take 1 tablet by mouth  daily. Patient not taking: Reported on 08/20/2022 03/03/19   Malvin Johns, MD  traZODone (DESYREL) 50 MG tablet Take 50 mg by mouth at bedtime.    [provider]  zinc gluconate 50 MG tablet Take 50 mg by mouth daily.    [provider]  zolpidem (AMBIEN CR) 12.5 MG CR tablet Take 12.5 mg by mouth at bedtime.     [provider]      Allergies    Gabapentin and Trileptal [oxcarbazepine]    Review of Systems   Review of Systems  Constitutional:  Negative for chills and fever.  Respiratory:  Negative for shortness of breath.   Cardiovascular:  Negative for chest pain and palpitations.  Gastrointestinal:  Positive for abdominal pain and constipation. Negative for blood in stool, nausea and vomiting.  Neurological:  Negative for syncope, weakness and light-headedness.  All other systems reviewed and are negative.   Physical Exam Updated Vital Signs BP 110/82   Pulse 77   Temp 98.4 F (36.9 C)   Resp 19   SpO2 100%  Physical Exam Vitals and nursing note reviewed.  Constitutional:      General: He is not in acute distress.    Appearance: Normal appearance. He is not ill-appearing.  HENT:     Head: Normocephalic and atraumatic.     Nose: Nose normal.  Eyes:     General: No scleral icterus.    Extraocular Movements: Extraocular movements intact.     Conjunctiva/sclera: Conjunctivae normal.  Cardiovascular:     Rate and Rhythm: Normal rate and regular rhythm.     Pulses: Normal pulses.  Pulmonary:     Effort: Pulmonary effort is normal. No respiratory distress.     Breath sounds: Normal breath sounds. No wheezing or rales.  Abdominal:     General: There is no distension.     Palpations: Abdomen is soft.     Tenderness: There is abdominal tenderness. There is guarding. There is no right CVA tenderness, left CVA tenderness or rebound.  Musculoskeletal:        General: Normal range of motion.     Cervical back: Normal range of motion.  Skin:     General: Skin is warm and dry.  Neurological:     General: No focal deficit present.     Mental Status: He is alert. Mental status is at baseline.     ED Results / Procedures / Treatments   Labs (all labs ordered are listed, but only abnormal results are displayed) Labs Reviewed  COMPREHENSIVE METABOLIC PANEL - Abnormal; Notable for the following components:      Result Value   Sodium 134 (*)    Glucose, Bld 109 (*)    All other components within normal limits  URINALYSIS, ROUTINE W REFLEX MICROSCOPIC - Abnormal; Notable for  the following components:   Color, Urine STRAW (*)    All other components within normal limits  LIPASE, BLOOD  CBC  TROPONIN I (HIGH SENSITIVITY)    EKG None  Radiology DG Chest 2 View  Result Date: 09/10/2022 CLINICAL DATA:  Chest pain EXAM: CHEST - 2 VIEW COMPARISON:  Chest x-ray 09/09/2022. FINDINGS: Unchanged cardiomediastinal silhouette. Both lungs are clear. No visible pleural effusions or pneumothorax. No acute osseous abnormality. Partially imaged ACDF. Broad dextrocurvature of the thoracic spine. IMPRESSION: No active cardiopulmonary disease. Electronically Signed   By: Margaretha Sheffield M.D.   On: 09/10/2022 17:07   CT ABDOMEN PELVIS W CONTRAST  Result Date: 09/10/2022 CLINICAL DATA:  Left lower quadrant abdominal pain. Left inguinal hernia repair on 08/20/2022. EXAM: CT ABDOMEN AND PELVIS WITH CONTRAST TECHNIQUE: Multidetector CT imaging of the abdomen and pelvis was performed using the standard protocol following bolus administration of intravenous contrast. RADIATION DOSE REDUCTION: This exam was performed according to the departmental dose-optimization program which includes automated exposure control, adjustment of the mA and/or kV according to patient size and/or use of iterative reconstruction technique. CONTRAST:  166mL OMNIPAQUE IOHEXOL 350 MG/ML SOLN COMPARISON:  08/30/2022 FINDINGS: Lower chest: No acute abnormality. Hepatobiliary: No focal  liver abnormality is seen. No gallstones, gallbladder wall thickening, or biliary dilatation. Pancreas: Unremarkable. No pancreatic ductal dilatation or surrounding inflammatory changes. Spleen: Normal in size without focal abnormality. Adrenals/Urinary Tract: Adrenal glands are unremarkable. Kidneys are normal, without renal calculi, focal lesion, or hydronephrosis. Bladder is unremarkable. Stomach/Bowel: Bowel shows no evidence of obstruction, ileus, inflammation or lesion. There is moderate stool throughout the colon. The appendix is not visualized. No free intraperitoneal air. Vascular/Lymphatic: Stable atherosclerosis of the abdominal aorta without aneurysm. No enlarged lymph nodes identified in the abdomen or pelvis. Reproductive: Prostate is unremarkable. Other: Decrease postoperative stranding in the left inguinal canal without evidence of recurrent hernia or abnormal fluid collection. Musculoskeletal: Stable degenerative disc disease of the lumbar spine with associated leftward convex scoliosis. IMPRESSION: 1. No acute findings in the abdomen or pelvis. 2. Decrease in postoperative stranding in the left inguinal canal without evidence of recurrent hernia or abnormal fluid collection. 3. Stable atherosclerosis of the abdominal aorta without aneurysm. 4. Moderate stool throughout the colon. Aortic Atherosclerosis (ICD10-I70.0). Electronically Signed   By: Aletta Edouard M.D.   On: 09/10/2022 16:56    Procedures Procedures    Medications Ordered in ED Medications  lactated ringers bolus 2,000 mL (has no administration in time range)  ondansetron (ZOFRAN) injection 4 mg (4 mg Intravenous Given 09/10/22 1710)  morphine (PF) 4 MG/ML injection 4 mg (4 mg Intravenous Given 09/10/22 1711)  iohexol (OMNIPAQUE) 350 MG/ML injection 100 mL (100 mLs Intravenous Contrast Given 09/10/22 1647)    ED Course/ Medical Decision Making/ A&P Clinical Course as of 09/10/22 1959  Fri Sep 10, 2022  1621 Stable,  abdominal pain transferred from clinic for eval for abdominal pain and rule out obstruction. Labs OK. CT pending. Reportedly Hypotensive in clinic and now resolved. Rehydrate and reassess. [CC]  1959 Objectives ok. Visualized patient. NAD. Stable for OP. [CC]    Clinical Course User Index [CC] Tretha Sciara, MD                             Medical Decision Making Amount and/or Complexity of Data Reviewed Radiology: ordered.  Risk Prescription drug management.   Medical Decision Making / ED Course   This patient  presents to the ED for concern of abdominal pain, this involves an extensive number of treatment options, and is a complaint that carries with it a high risk of complications and morbidity.  The differential diagnosis includes small bowel obstruction, constipation, pancreatitis, diverticulitis, colitis, appendicitis  MDM: 67 year old male with recent hernia repair in December presents today for evaluation of lower abdominal pain.  He was found to be hypotensive in the clinic and was referred to the emergency department.  No syncopal episode.  Appears well on exam.  He does appear to be very anxious.  Normotensive since arriving to the emergency department.  Endorsed transient episode of chest pain last night however chest pain-free currently.  Without dyspnea, lightheadedness, or palpitations.  Suspect that his hypotension if accurate was likely secondary to dehydration as he does not drink more than 30 oz day, has chapped lips on exam and is constantly licking his lips.  Does have dry mucous membranes.  Abdomen has mild generalized tenderness but no guarding or rebound tenderness.  CBC does not reveal any leukocytosis or anemia.  CMP is without renal insufficiency or other concerning findings.  Lipase within normal limits.  UA without evidence of UTI.  Troponin of 5.  Chest x-ray without acute cardiopulmonary process.  CT abdomen pelvis with contrast without acute intra-abdominal  process.  Does show moderate stool burden, but no impacted stool ball.  Extensive discussion had with patient regarding compliance with his outpatient bowel regimen, and increasing his water intake.  Discussed follow-up with his GI outpatient.  He is stable for outpatient follow-up.  He was given IV fluids in the emergency department as well as pain and nausea medication.  Tolerated p.o. intake without difficulty.  Case discussed with attending who was in agreement with plan. Patient refused EKG.  Lab Tests: -I ordered, reviewed, and interpreted labs.   The pertinent results include:   Labs Reviewed  COMPREHENSIVE METABOLIC PANEL - Abnormal; Notable for the following components:      Result Value   Sodium 134 (*)    Glucose, Bld 109 (*)    All other components within normal limits  URINALYSIS, ROUTINE W REFLEX MICROSCOPIC - Abnormal; Notable for the following components:   Color, Urine STRAW (*)    All other components within normal limits  LIPASE, BLOOD  CBC  TROPONIN I (HIGH SENSITIVITY)  TROPONIN I (HIGH SENSITIVITY)      EKG  EKG Interpretation  Date/Time:    Ventricular Rate:    PR Interval:    QRS Duration:   QT Interval:    QTC Calculation:   R Axis:     Text Interpretation:           Imaging Studies ordered: I ordered imaging studies including ct abdomen pelvis w contrast I independently visualized and interpreted imaging. I agree with the radiologist interpretation   Medicines ordered and prescription drug management: Meds ordered this encounter  Medications   lactated ringers bolus 2,000 mL   ondansetron (ZOFRAN) injection 4 mg   morphine (PF) 4 MG/ML injection 4 mg   iohexol (OMNIPAQUE) 350 MG/ML injection 100 mL    -I have reviewed the patients home medicines and have made adjustments as needed   Reevaluation: After the interventions noted above, I reevaluated the patient and found that they have :improved  Co morbidities that complicate the  patient evaluation  Past Medical History:  Diagnosis Date   Allergy    Anxiety    Depression    DJD (degenerative  joint disease), cervical    postiton with pillow under knees, cant turn neck    Dysentery, amebic, acute 1981   GERD (gastroesophageal reflux disease)    H/O bronchitis    H/O malaria 1984   Hearing loss    bilateral    Hypertension    labile Blood pressure   Inguinal hernia    Insomnia    early morning awakening   MVP (mitral valve prolapse)    "no problems"   Perianal pain    Personal history of colonic polyps    Tinnitus    right ear      Dispostion: Patient is appropriate for discharge.  Discussed follow-up with GI outpatient.  Discussed compliance with his aggressive bowel regimen including MiraLAX 3 times a day, Senokot, and enema as needed.  Admission.  However patient's workup was overall reassuring and his symptoms improved.  Final Clinical Impression(s) / ED Diagnoses Final diagnoses:  Lower abdominal pain    Rx / DC Orders ED Discharge Orders     None         Evlyn Courier, PA-C 09/10/22 2004    Tretha Sciara, MD 09/11/22 1445

## 2022-09-26 ENCOUNTER — Other Ambulatory Visit: Payer: Self-pay

## 2022-09-26 ENCOUNTER — Emergency Department (HOSPITAL_COMMUNITY): Payer: Medicare Other

## 2022-09-26 ENCOUNTER — Emergency Department (HOSPITAL_COMMUNITY)
Admission: EM | Admit: 2022-09-26 | Discharge: 2022-09-26 | Disposition: A | Discharge status: Home / Self Care | Payer: Medicare Other | Attending: Emergency Medicine | Admitting: Emergency Medicine

## 2022-09-26 DIAGNOSIS — K5900 Constipation, unspecified: Secondary | ICD-10-CM | POA: Diagnosis not present

## 2022-09-26 DIAGNOSIS — R1031 Right lower quadrant pain: Secondary | ICD-10-CM | POA: Insufficient documentation

## 2022-09-26 LAB — COMPREHENSIVE METABOLIC PANEL
ALT: 25 U/L (ref 0–44)
AST: 28 U/L (ref 15–41)
Albumin: 4.3 g/dL (ref 3.5–5.0)
Alkaline Phosphatase: 61 U/L (ref 38–126)
Anion gap: 13 (ref 5–15)
BUN: 14 mg/dL (ref 8–23)
CO2: 22 mmol/L (ref 22–32)
Calcium: 9.5 mg/dL (ref 8.9–10.3)
Chloride: 95 mmol/L — ABNORMAL LOW (ref 98–111)
Creatinine, Ser: 0.86 mg/dL (ref 0.61–1.24)
GFR, Estimated: >60 mL/min (ref >60)
Glucose, Bld: 114 mg/dL — ABNORMAL HIGH (ref 70–99)
Potassium: 3.4 mmol/L — ABNORMAL LOW (ref 3.5–5.1)
Sodium: 130 mmol/L — ABNORMAL LOW (ref 135–145)
Total Bilirubin: 0.9 mg/dL (ref 0.3–1.2)
Total Protein: 6.7 g/dL (ref 6.5–8.1)

## 2022-09-26 LAB — CBC WITH DIFFERENTIAL/PLATELET
Abs Immature Granulocytes: 0.02 10*3/uL (ref 0.00–0.07)
Basophils Absolute: 0 10*3/uL (ref 0.0–0.1)
Basophils Relative: 0 %
Eosinophils Absolute: 0 10*3/uL (ref 0.0–0.5)
Eosinophils Relative: 0 %
HCT: 39.8 % (ref 39.0–52.0)
Hemoglobin: 13.9 g/dL (ref 13.0–17.0)
Immature Granulocytes: 0 %
Lymphocytes Relative: 18 %
Lymphs Abs: 1.4 10*3/uL (ref 0.7–4.0)
MCH: 31.6 pg (ref 26.0–34.0)
MCHC: 34.9 g/dL (ref 30.0–36.0)
MCV: 90.5 fL (ref 80.0–100.0)
Monocytes Absolute: 0.5 10*3/uL (ref 0.1–1.0)
Monocytes Relative: 7 %
Neutro Abs: 5.7 10*3/uL (ref 1.7–7.7)
Neutrophils Relative %: 75 %
Platelets: 202 10*3/uL (ref 150–400)
RBC: 4.4 MIL/uL (ref 4.22–5.81)
RDW: 13.3 % (ref 11.5–15.5)
WBC: 7.6 10*3/uL (ref 4.0–10.5)
nRBC: 0 % (ref 0.0–0.2)

## 2022-09-26 LAB — LACTIC ACID, PLASMA: Lactic Acid, Venous: 1.9 mmol/L (ref 0.5–1.9)

## 2022-09-26 MED ORDER — IOHEXOL 300 MG/ML  SOLN
100.0000 mL | Freq: Once | INTRAMUSCULAR | Status: AC | PRN
  Administered 2022-09-26: 100 mL via INTRAVENOUS

## 2022-09-26 MED ORDER — SODIUM CHLORIDE 0.9 % IV BOLUS
1000.0000 mL | Freq: Once | INTRAVENOUS | Status: AC
  Administered 2022-09-26: 1000 mL via INTRAVENOUS

## 2022-09-26 MED ORDER — LORAZEPAM 2 MG/ML IJ SOLN
1.0000 mg | Freq: Once | INTRAMUSCULAR | Status: AC
  Administered 2022-09-26: 1 mg via INTRAVENOUS
  Filled 2022-09-26: qty 1

## 2022-09-26 MED ORDER — DOCUSATE SODIUM 100 MG PO CAPS: 100.0000 mg | ORAL_CAPSULE | Freq: Two times a day (BID) | ORAL | 0 refills | Status: AC

## 2022-09-26 NOTE — ED Provider Triage Note (Signed)
Emergency Medicine Provider Triage Evaluation Note  Tunis Gentle Ihde , a 67 y.o. male  was evaluated in triage.  Pt complains of right groin pain.  He had surgery on the left groin for hernia 1 month ago.  Patient states that he has been having constipation.  He has used an enema.  He states it feels like he "popped my colon".  No vomiting.  Transported by EMS.  Review of Systems  Positive: Groin pain Negative: Fever  Physical Exam  There were no vitals taken for this visit. Gen:   Awake, no distress   Resp:  Normal effort  MSK:   Moves extremities without difficulty  Other:  Healing left groin wound without signs of infection left groin  Medical Decision Making  Medically screening exam initiated at 5:23 PM.  Appropriate orders placed.  Massiah C Kelliher was informed that the remainder of the evaluation will be completed by another provider, this initial triage assessment does not replace that evaluation, and the importance of remaining in the ED until their evaluation is complete.     Carlisle Cater, PA-C 09/26/22 1725

## 2022-09-26 NOTE — ED Triage Notes (Signed)
BIBA c/o right groin pain radiating into lower abd with headache and constipation x2 days recent hernia repair in December  Reports using enema last night with little output Denies n/v

## 2022-09-26 NOTE — ED Provider Notes (Signed)
[place] EMERGENCY DEPARTMENT AT [facility] Provider Note   CSN: [id number] Arrival date & time: 09/26/22  1713     History  Chief Complaint  Patient presents with   Abdominal Pain    Brad [name] is a 67 y.o. male.  67 year old male with prior medical history as detailed below presents for evaluation.  Patient reports right sided groin pain.  This has been present for the last 2 to 3 days.  Patient reports prior left inguinal hernia status postrepair approximately 1 month ago.  Patient reports chronic issues with constipation.  Patient denies associated nausea or vomiting.  Patient denies fever.    Patient is visibly anxious on initial evaluation.    The history is provided by the patient and medical records.       Home Medications Prior to Admission medications   Medication Sig Start Date End Date Taking? Authorizing Provider  acetaminophen (TYLENOL) 500 MG tablet Take 2 tablets (1,000 mg total) by mouth every 8 (eight) hours. 08/27/22 09/26/22  Kommor, [place], MD  Alpha-Lipoic Acid 600 MG CAPS Take 1 capsule by mouth daily.    [provider]  amLODipine (NORVASC) 5 MG tablet Take 2 tablets (10 mg total) by mouth daily. Patient taking differently: Take 5 mg by mouth daily. 03/02/19   Malvin Johns, MD  atorvastatin (LIPITOR) 10 MG tablet Take 10 mg by mouth 3 (three) times a week. MWF 08/10/19   [provider]  busPIRone (BUSPAR) 10 MG tablet Take 20 mg by mouth 3 (three) times daily.     [provider]  Cholecalciferol (VITAMIN D3) 125 MCG (5000 UT) TABS Take 1 tablet by mouth daily.    [provider]  Coenzyme Q10 50 MG CAPS Take 1 capsule by mouth daily.    [provider]  doxepin (SINEQUAN) 25 MG capsule Take 25 mg by mouth at bedtime.    [provider]  LORazepam (ATIVAN) 1 MG tablet Take 1-2 mg by mouth 2 (two) times daily. Take 1 tablet in the morning and Take 2 tablets at bedtime 10/26/18    [provider]  losartan (COZAAR) 100 MG tablet Take 100 mg by mouth daily. 02/04/20   [provider]  memantine (NAMENDA) 10 MG tablet Take 1 tablet (10 mg total) by mouth 2 (two) times daily. Patient not taking: Reported on 08/20/2022 03/02/19   Malvin Johns, MD  Methylfol-Methylcob-Acetylcyst (CEREFOLIN NAC) 6-2-600 MG TABS 1 a day Patient not taking: Reported on 08/20/2022 03/02/19   Malvin Johns, MD  metoprolol tartrate (LOPRESSOR) 25 MG tablet Take 25 mg by mouth 2 (two) times daily. Per Pt he "restarted it" as his MD had stopped it this week    [provider]  mirtazapine (REMERON) 30 MG tablet Take 30 mg by mouth at bedtime.    [provider]  Multiple Vitamin (MULTIVITAMIN) tablet Take 1 tablet by mouth daily.    [provider]  Omega-3 1000 MG CAPS Take 1 capsule by mouth daily.    [provider]  oxyCODONE (OXY IR/ROXICODONE) 5 MG immediate release tablet Take 1 tablet (5 mg total) by mouth every 6 (six) hours as needed for severe pain or breakthrough pain. 08/23/22   Karie Soda, MD  polyethylene glycol (MIRALAX) 17 g packet Take 17 g by mouth daily. 08/26/22   Rancour, Jeannett Senior, MD  pregabalin (LYRICA) 75 MG capsule Take 75 mg by mouth 2 (two) times daily.    [provider]  Prenatal Vit-Fe Fumarate-FA (PRENATAL MULTIVITAMIN) TABS tablet Take 1 tablet by mouth daily. Patient not taking: Reported on 08/20/2022 03/03/19   Johnn Hai, MD  traZODone (DESYREL) 50 MG tablet Take 50 mg by mouth at bedtime.    [provider]  zinc gluconate 50 MG tablet Take 50 mg by mouth daily.    [provider]  zolpidem (AMBIEN CR) 12.5 MG CR tablet Take 12.5 mg by mouth at bedtime.     [provider]      Allergies    Duloxetine hcl, Gabapentin, and Trileptal [oxcarbazepine]    Review of Systems   Review of Systems  All other systems reviewed and are negative.   Physical Exam Updated Vital Signs BP  (!) 143/96   Pulse 81   Temp 98.2 F (36.8 C)   Resp (!) 22   SpO2 100%  Physical Exam Vitals and nursing note reviewed.  Constitutional:      General: He is not in acute distress.    Appearance: Normal appearance.     Comments: Alert, anxious  HENT:     Head: Normocephalic and atraumatic.  Eyes:     Conjunctiva/sclera: Conjunctivae normal.     Pupils: Pupils are equal, round, and reactive to light.  Cardiovascular:     Rate and Rhythm: Normal rate and regular rhythm.     Heart sounds: Normal heart sounds.  Pulmonary:     Effort: Pulmonary effort is normal. No respiratory distress.     Breath sounds: Normal breath sounds.  Abdominal:     General: There is no distension.     Palpations: Abdomen is soft.     Tenderness: There is abdominal tenderness in the right lower quadrant.  Musculoskeletal:        General: No deformity. Normal range of motion.     Cervical back: Normal range of motion and neck supple.  Skin:    General: Skin is warm and dry.  Neurological:     General: No focal deficit present.     Mental Status: He is alert and oriented to person, place, and time.     ED Results / Procedures / Treatments   Labs (all labs ordered are listed, but only abnormal results are displayed) Labs Reviewed  COMPREHENSIVE METABOLIC PANEL - Abnormal; Notable for the following components:      Result Value   Sodium 130 (*)    Potassium 3.4 (*)    Chloride 95 (*)    Glucose, Bld 114 (*)    All other components within normal limits  CBC WITH DIFFERENTIAL/PLATELET  LACTIC ACID, PLASMA  URINALYSIS, ROUTINE W REFLEX MICROSCOPIC    EKG None  Radiology CT ABDOMEN PELVIS W CONTRAST  Result Date: 09/26/2022 CLINICAL DATA:  Abdominal pain EXAM: CT ABDOMEN AND PELVIS WITH CONTRAST TECHNIQUE: Multidetector CT imaging of the abdomen and pelvis was performed using the standard protocol following bolus administration of intravenous contrast. RADIATION DOSE REDUCTION: This exam was  performed according to the departmental dose-optimization program which includes automated exposure control, adjustment of the mA and/or kV according to patient size and/or use of iterative reconstruction technique. CONTRAST:  147mL OMNIPAQUE IOHEXOL 300 MG/ML  SOLN COMPARISON:  Multiple recent priors, including 09/10/2022, 08/30/2022, and 08/18/2022 FINDINGS: Lower chest: Lung bases are clear. Hepatobiliary: Scattered small hepatic cysts, measuring up to 10 mm in segment 4B (series 3/image 20), benign. No follow-up is recommended. Gallbladder is unremarkable. No intrahepatic or extrahepatic ductal dilatation. Pancreas: Within normal limits. Spleen: Within normal  limits. Adrenals/Urinary Tract: Adrenal glands are within normal limits. Kidneys are within normal limits.  No hydronephrosis. Bladder is within normal limits. Stomach/Bowel: Stomach is within normal limits. No evidence of bowel obstruction. Appendix is not discretely visualized. Moderate colonic stool burden, suggesting mild constipation. No colonic wall thickening or inflammatory changes. Vascular/Lymphatic: No evidence of abdominal aortic aneurysm. Atherosclerotic calcifications of the abdominal aorta and branch vessels. No suspicious abdominopelvic lymphadenopathy. Reproductive: Prostate is unremarkable. Other: No abdominopelvic ascites. Postprocedural changes in the left inguinal region. Musculoskeletal: Mild degenerative changes of the mid lumbar spine. IMPRESSION: No CT findings to account for the patient's abdominal pain. No change from multiple recent priors. Electronically Signed   By: Charline Bills M.D.   On: 09/26/2022 19:47    Procedures Procedures    Medications Ordered in ED Medications  LORazepam (ATIVAN) injection 1 mg (has no administration in time range)  sodium chloride 0.9 % bolus 1,000 mL (has no administration in time range)  iohexol (OMNIPAQUE) 300 MG/ML solution 100 mL (100 mLs Intravenous Contrast Given 09/26/22 1922)     ED Course/ Medical Decision Making/ A&P                             Medical Decision Making Amount and/or Complexity of Data Reviewed Radiology: ordered.  Risk OTC drugs. Prescription drug management.    Medical Screen Complete  This patient presented to the ED with complaint of right groin pain.  This complaint involves an extensive number of treatment options. The initial differential diagnosis includes, but is not limited to, right groin strain, other intra-abdominal pathology, anxiety, metabolic abnormality, etc.  This presentation is: Acute, Chronic, Self-Limited, Previously Undiagnosed, Uncertain Prognosis, Complicated, Systemic Symptoms, and Threat to Life/Bodily Function  Patient with complaint of right-sided groin pain.  Patient reports significant issues with both constipation chronically and anxiety.  He is visibly anxious on evaluation.  Patient without evidence of inguinal hernia or other significant pathology on exam.  CT imaging is without any acute pathology.  Imaging does demonstrate chronic constipation.  Screening labs obtained are without significant abnormality.  Patient does appear more calm and more comfortable after administration of Ativan.  Extensively discussed with patient that his symptoms may be related to both chronic constipation and also high levels of anxiety.  Patient is advised to follow-up closely with his PCP and regular GI specialist tomorrow.  Patient with already established chronic constipation regimen.  Will add Colace.  Patient is advised to closely follow-up with his PCP and return for any worsening symptoms.  Strict return precautions given and understood. Additional history obtained:  External records from outside sources obtained and reviewed including prior ED visits and prior Inpatient records.    Lab Tests:  I ordered and personally interpreted labs.  The pertinent results include: CBC, CMP, lactic   Imaging  Studies ordered:  I ordered imaging studies including CT abdomen pelvis I independently visualized and interpreted obtained imaging which showed NAD I agree with the radiologist interpretation.   Cardiac Monitoring:  The patient was maintained on a cardiac monitor.  I personally viewed and interpreted the cardiac monitor which showed an underlying rhythm of: NSR   Medicines ordered:  I ordered medication including Ativan, IV fluids for anxiety Reevaluation of the patient after these medicines showed that the patient: improved Problem List / ED Course:  Right groin pain, anxiety   Reevaluation:  After the interventions noted above, I reevaluated the patient and  found that they have: improved   Disposition:  After consideration of the diagnostic results and the patients response to treatment, I feel that the patent would benefit from close outpatient follow-up.          Final Clinical Impression(s) / ED Diagnoses Final diagnoses:  Right lower quadrant abdominal pain  Constipation, unspecified constipation type    Rx / DC Orders ED Discharge Orders          Ordered    docusate sodium (COLACE) 100 MG capsule  Every 12 hours        09/26/22 2026              Valarie Merino, MD 09/26/22 2030

## 2022-09-26 NOTE — Discharge Instructions (Addendum)
Return for any problem.   Call your GI specialist tomorrow for further advice about how to manage and deal with your chronic constipation.

## 2022-09-26 NOTE — ED Notes (Signed)
Pt. Made aware for the need of urine specimen. 

## 2022-09-28 LAB — URINALYSIS, ROUTINE W REFLEX MICROSCOPIC
Bacteria, UA: NONE SEEN
Bilirubin Urine: NEGATIVE
Glucose, UA: NEGATIVE mg/dL
Hgb urine dipstick: NEGATIVE
Ketones, ur: NEGATIVE mg/dL
Leukocytes,Ua: NEGATIVE
Nitrite: NEGATIVE
Protein, ur: NEGATIVE mg/dL
Specific Gravity, Urine: 1.025 (ref 1.005–1.030)
pH: 8 (ref 5.0–8.0)

## 2022-09-29 ENCOUNTER — Other Ambulatory Visit: Payer: Self-pay

## 2022-09-29 ENCOUNTER — Emergency Department (HOSPITAL_COMMUNITY)
Admission: EM | Admit: 2022-09-29 | Discharge: 2022-09-30 | Disposition: A | Discharge status: Home / Self Care | Payer: Medicare Other | Attending: Emergency Medicine | Admitting: Emergency Medicine

## 2022-09-29 DIAGNOSIS — K828 Other specified diseases of gallbladder: Secondary | ICD-10-CM | POA: Diagnosis not present

## 2022-09-29 DIAGNOSIS — Z79899 Other long term (current) drug therapy: Secondary | ICD-10-CM | POA: Insufficient documentation

## 2022-09-29 DIAGNOSIS — K5900 Constipation, unspecified: Secondary | ICD-10-CM | POA: Insufficient documentation

## 2022-09-29 DIAGNOSIS — R1031 Right lower quadrant pain: Secondary | ICD-10-CM

## 2022-09-29 DIAGNOSIS — I10 Essential (primary) hypertension: Secondary | ICD-10-CM | POA: Insufficient documentation

## 2022-09-29 LAB — CBC WITH DIFFERENTIAL/PLATELET
Abs Immature Granulocytes: 0.02 10*3/uL (ref 0.00–0.07)
Basophils Absolute: 0.1 10*3/uL (ref 0.0–0.1)
Basophils Relative: 1 %
Eosinophils Absolute: 0.1 10*3/uL (ref 0.0–0.5)
Eosinophils Relative: 1 %
HCT: 37.4 % — ABNORMAL LOW (ref 39.0–52.0)
Hemoglobin: 13.3 g/dL (ref 13.0–17.0)
Immature Granulocytes: 0 %
Lymphocytes Relative: 28 %
Lymphs Abs: 2.1 10*3/uL (ref 0.7–4.0)
MCH: 31.8 pg (ref 26.0–34.0)
MCHC: 35.6 g/dL (ref 30.0–36.0)
MCV: 89.5 fL (ref 80.0–100.0)
Monocytes Absolute: 0.6 10*3/uL (ref 0.1–1.0)
Monocytes Relative: 8 %
Neutro Abs: 4.7 10*3/uL (ref 1.7–7.7)
Neutrophils Relative %: 62 %
Platelets: 197 10*3/uL (ref 150–400)
RBC: 4.18 MIL/uL — ABNORMAL LOW (ref 4.22–5.81)
RDW: 13.3 % (ref 11.5–15.5)
WBC: 7.6 10*3/uL (ref 4.0–10.5)
nRBC: 0 % (ref 0.0–0.2)

## 2022-09-29 NOTE — ED Triage Notes (Signed)
Pt reports RLQ abdominal pain for the last two days. Pt reports dealing with constipation recently. Pt reports intermittent nausea.

## 2022-09-29 NOTE — ED Provider Triage Note (Signed)
Emergency Medicine Provider Triage Evaluation Note  Harriet Bollen Hillis , a 67 y.o. male  was evaluated in triage.  Pt complains of RLQ abdominal pain for the past 2 days.  Patient reports dealing with constipation recently and intermittent nausea.  He has been seen multiple times this month for similar symptoms.    Review of Systems  Positive: As above Negative: As above  Physical Exam  BP (!) 143/107 (BP Location: Left Arm)   Pulse 93   Temp 97.8 F (36.6 C) (Oral)   Resp 17   SpO2 98%  Gen:   Awake, no distress, appears to be in pain Resp:  Normal effort  MSK:   Moves extremities without difficulty  Other:    Medical Decision Making  Medically screening exam initiated at 11:20 PM.  Appropriate orders placed.  Mervil C Rolland was informed that the remainder of the evaluation will be completed by another provider, this initial triage assessment does not replace that evaluation, and the importance of remaining in the ED until their evaluation is complete.     Theressa Stamps R, [place] 09/29/22 2325

## 2022-09-30 ENCOUNTER — Emergency Department (HOSPITAL_COMMUNITY): Payer: Medicare Other

## 2022-09-30 DIAGNOSIS — R1031 Right lower quadrant pain: Secondary | ICD-10-CM | POA: Diagnosis not present

## 2022-09-30 LAB — LIPASE, BLOOD: Lipase: 66 U/L — ABNORMAL HIGH (ref 11–51)

## 2022-09-30 LAB — HEPATIC FUNCTION PANEL
ALT: 23 U/L (ref 0–44)
AST: 25 U/L (ref 15–41)
Albumin: 4.2 g/dL (ref 3.5–5.0)
Alkaline Phosphatase: 62 U/L (ref 38–126)
Bilirubin, Direct: 0.1 mg/dL (ref 0.0–0.2)
Indirect Bilirubin: 0.6 mg/dL (ref 0.3–0.9)
Total Bilirubin: 0.7 mg/dL (ref 0.3–1.2)
Total Protein: 6.5 g/dL (ref 6.5–8.1)

## 2022-09-30 LAB — D-DIMER, QUANTITATIVE: D-Dimer, Quant: 0.37 ug/mL-FEU (ref 0.00–0.50)

## 2022-09-30 LAB — BASIC METABOLIC PANEL
Anion gap: 11 (ref 5–15)
BUN: 21 mg/dL (ref 8–23)
CO2: 20 mmol/L — ABNORMAL LOW (ref 22–32)
Calcium: 9.2 mg/dL (ref 8.9–10.3)
Chloride: 99 mmol/L (ref 98–111)
Creatinine, Ser: 0.81 mg/dL (ref 0.61–1.24)
GFR, Estimated: >60 mL/min (ref >60)
Glucose, Bld: 119 mg/dL — ABNORMAL HIGH (ref 70–99)
Potassium: 3.6 mmol/L (ref 3.5–5.1)
Sodium: 130 mmol/L — ABNORMAL LOW (ref 135–145)

## 2022-09-30 LAB — URINALYSIS, ROUTINE W REFLEX MICROSCOPIC
Bilirubin Urine: NEGATIVE
Glucose, UA: NEGATIVE mg/dL
Hgb urine dipstick: NEGATIVE
Ketones, ur: NEGATIVE mg/dL
Leukocytes,Ua: NEGATIVE
Nitrite: NEGATIVE
Protein, ur: NEGATIVE mg/dL
Specific Gravity, Urine: 1.015 (ref 1.005–1.030)
pH: 7 (ref 5.0–8.0)

## 2022-09-30 MED ORDER — HYDROMORPHONE HCL 1 MG/ML IJ SOLN
0.5000 mg | Freq: Once | INTRAMUSCULAR | Status: AC
  Administered 2022-09-30: 0.5 mg via INTRAVENOUS
  Filled 2022-09-30: qty 1

## 2022-09-30 MED ORDER — LORAZEPAM 2 MG/ML IJ SOLN
0.5000 mg | Freq: Once | INTRAMUSCULAR | Status: AC
  Administered 2022-09-30: 0.5 mg via INTRAVENOUS
  Filled 2022-09-30: qty 1

## 2022-09-30 MED ORDER — LORAZEPAM 2 MG/ML IJ SOLN: 1.0000 mg | Freq: Once | INTRAMUSCULAR | Status: DC

## 2022-09-30 MED ORDER — DICYCLOMINE HCL 10 MG/ML IM SOLN
20.0000 mg | Freq: Once | INTRAMUSCULAR | Status: AC
  Administered 2022-09-30: 20 mg via INTRAMUSCULAR
  Filled 2022-09-30: qty 2

## 2022-09-30 MED ORDER — DICYCLOMINE HCL 20 MG PO TABS: 20.0000 mg | ORAL_TABLET | Freq: Two times a day (BID) | ORAL | 0 refills | Status: DC | PRN

## 2022-09-30 NOTE — ED Notes (Signed)
Water provided

## 2022-09-30 NOTE — Discharge Instructions (Addendum)
Right lower quadrant pain-possibly this is coming from continued constipation, please continue taking your laxatives as prescribed, remember stay hydrated drink plenty of water, please exercise and eat foods high in fiber. Gallbladder wall thickening-likely this is a benign finding, would like you to follow-up with general surgery for further evaluation.  Come back to the emergency department if you develop chest pain, shortness of breath, severe abdominal pain, uncontrolled nausea, vomiting, diarrhea.

## 2022-09-30 NOTE — ED Provider Notes (Signed)
[place] EMERGENCY DEPARTMENT AT [facility] Provider Note   CSN: [id number] Arrival date & time: 09/29/22  2258     History  Chief Complaint  Patient presents with   Abdominal Pain    Brad [name] is a 67 y.o. male.  HPI   Patient with medical history including GERD, hypertension, hernia repair presents with complaints of right lower quadrant tenderness.  Patient states that he has been having this pain for the last 2 months, states pain has gotten worse since his hernia repair in December, states pain is intermittent, will remain in his right lower quadrant, no associated nausea or vomiting, states he has difficulty with constipation, states he has been constipated for the last week, states he has been taking laxatives without much relief.  States he has  been seen in the past for this and was told to continue with laxatives.  Patient has had small bowel movements yesterday, denies bloody stools or dark tarry stools denies any urinary symptoms.  Reviewed patient's chart was seen in the past for similar presentations, was most recently seen 2 days ago, patient had benign workup including CBC which was unremarkable, CMP was unremarkable, urine which was unremarkable, CT imaging.    Home Medications Prior to Admission medications   Medication Sig Start Date End Date Taking? Authorizing Provider  Alpha-Lipoic Acid 600 MG CAPS Take 1 capsule by mouth daily.   Yes [provider]  amLODipine (NORVASC) 5 MG tablet Take 2 tablets (10 mg total) by mouth daily. Patient taking differently: Take 5 mg by mouth daily. 03/02/19  Yes Malvin Johns, MD  atorvastatin (LIPITOR) 10 MG tablet Take 10 mg by mouth 3 (three) times a week. MWF 08/10/19  Yes [provider]  busPIRone (BUSPAR) 10 MG tablet Take 20 mg by mouth 3 (three) times daily.    Yes [provider]  Cholecalciferol (VITAMIN D3) 125 MCG (5000 UT) TABS Take 1 tablet by mouth daily.   Yes [provider]  Coenzyme Q10 50 MG CAPS Take 1 capsule by mouth daily.   Yes [provider]  dicyclomine (BENTYL) 20 MG tablet Take 1 tablet (20 mg total) by mouth 2 (two) times daily as needed for spasms. 09/30/22  Yes Carroll Sage, PA-C  docusate sodium (COLACE) 100 MG capsule Take 1 capsule (100 mg total) by mouth every 12 (twelve) hours. 09/26/22  Yes Messick, Noralyn Pick, MD  LORazepam (ATIVAN) 1 MG tablet Take 1 mg by mouth 2 (two) times daily. 10/26/18  Yes [provider]  losartan (COZAAR) 100 MG tablet Take 100 mg by mouth daily. 02/04/20  Yes [provider]  mirtazapine (REMERON) 30 MG tablet Take 30 mg by mouth at bedtime.   Yes [provider]  Multiple Vitamin (MULTIVITAMIN) tablet Take 1 tablet by mouth daily.   Yes [provider]  Omega-3 1000 MG CAPS Take 1 capsule by mouth daily.   Yes [provider]  polyethylene glycol (MIRALAX) 17 g packet Take 17 g by mouth daily. 08/26/22  Yes Rancour, Jeannett Senior, MD  pregabalin (LYRICA) 75 MG capsule Take 75 mg by mouth 2 (two) times daily.   Yes [provider]  traZODone (DESYREL) 50 MG tablet Take 50 mg by mouth at bedtime.   Yes [provider]  zaleplon (SONATA) 10 MG capsule Take 10 mg by mouth at bedtime as needed for sleep.   Yes [provider]  zinc gluconate 50 MG tablet Take 50  mg by mouth daily.   Yes [provider]  zolpidem (AMBIEN CR) 12.5 MG CR tablet Take 12.5 mg by mouth at bedtime.    Yes [provider]  memantine (NAMENDA) 10 MG tablet Take 1 tablet (10 mg total) by mouth 2 (two) times daily. Patient not taking: Reported on 08/20/2022 03/02/19   Johnn Hai, MD  Methylfol-Methylcob-Acetylcyst (CEREFOLIN NAC) 6-2-600 MG TABS 1 a day Patient not taking: Reported on 08/20/2022 03/02/19   Johnn Hai, MD  oxyCODONE (OXY IR/ROXICODONE) 5 MG immediate release tablet Take 1 tablet (5 mg total) by mouth every 6 (six) hours as needed  for severe pain or breakthrough pain. Patient not taking: Reported on 09/30/2022 08/23/22   Michael Boston, MD  Prenatal Vit-Fe Fumarate-FA (PRENATAL MULTIVITAMIN) TABS tablet Take 1 tablet by mouth daily. Patient not taking: Reported on 08/20/2022 03/03/19   Johnn Hai, MD      Allergies    Duloxetine hcl, Gabapentin, and Trileptal [oxcarbazepine]    Review of Systems   Review of Systems  Constitutional:  Negative for chills and fever.  Respiratory:  Negative for shortness of breath.   Cardiovascular:  Negative for chest pain.  Gastrointestinal:  Positive for abdominal pain and constipation.  Neurological:  Negative for headaches.    Physical Exam Updated Vital Signs BP (!) 146/96   Pulse 61   Temp 98.3 F (36.8 C) (Oral)   Resp 18   SpO2 98%  Physical Exam Vitals and nursing note reviewed. Exam conducted with a chaperone present.  Constitutional:      General: He is not in acute distress.    Appearance: He is not ill-appearing.  HENT:     Head: Normocephalic and atraumatic.     Nose: No congestion.  Eyes:     Conjunctiva/sclera: Conjunctivae normal.  Cardiovascular:     Rate and Rhythm: Normal rate and regular rhythm.     Pulses: Normal pulses.     Heart sounds: No murmur heard.    No friction rub. No gallop.  Pulmonary:     Effort: No respiratory distress.     Breath sounds: No wheezing, rhonchi or rales.  Abdominal:     General: There is no distension.     Palpations: Abdomen is soft.     Tenderness: There is abdominal tenderness. There is no right CVA tenderness or left CVA tenderness.     Comments: Abdomen nondistended, soft, notable tenderness in his right lower quadrant without guarding contents or peritoneal sign negative Murphy sign McBurney point.  Genitourinary:    Comments: Chaperone present rectum was visualized no noted external hemorrhoids no rectal discharge rectal bleeding present, digital rectal exam performed no large stool mass present, no palpable  masses, there is no dark tarry stool or bloody stools present. Skin:    General: Skin is warm and dry.  Neurological:     Mental Status: He is alert.  Psychiatric:        Mood and Affect: Mood normal.     ED Results / Procedures / Treatments   Labs (all labs ordered are listed, but only abnormal results are displayed) Labs Reviewed  BASIC METABOLIC PANEL - Abnormal; Notable for the following components:      Result Value   Sodium 130 (*)    CO2 20 (*)    Glucose, Bld 119 (*)    All other components within normal limits  CBC WITH DIFFERENTIAL/PLATELET - Abnormal; Notable for the following components:   RBC 4.18 (*)  HCT 37.4 (*)    All other components within normal limits  LIPASE, BLOOD - Abnormal; Notable for the following components:   Lipase 66 (*)    All other components within normal limits  URINALYSIS, ROUTINE W REFLEX MICROSCOPIC  D-DIMER, QUANTITATIVE  HEPATIC FUNCTION PANEL    EKG None  Radiology US Abdomen Limited  Result Date: 09/30/2022 CLINICAL DATA:  Right upper quadrant pain for 2 days. EXAM: ULTRASOUND ABDOMEN LIMITED RIGHT UPPER QUADRANT COMPARISON:  09/26/2022. FINDINGS: Gallbladder: No stones are seen within the gallbladder. There is gallbladder wall thickening measuring 5 mm. No pericholecystic fluid. No sonographic Murphy sign noted by sonographer. Common bile duct: Diameter: 5 mm Liver: A small septated cyst is noted in the right lobe of the liver measuring 0.9 x 0.9 x 1.0 cm. Increased parenchymal echogenicity. Portal vein is patent on color Doppler imaging with normal direction of blood flow towards the liver. Other: No free fluid. IMPRESSION: 1. Gallbladder wall thickening with no evidence of cholelithiasis and no Murphy sign. Correlate clinically to exclude chronic cholecystitis versus local inflammatory changes. 2. Hepatic steatosis and cyst. Electronically Signed   By: Brett Fairy M.D.   On: 09/30/2022 02:39    Procedures Procedures     Medications Ordered in ED Medications  dicyclomine (BENTYL) injection 20 mg (20 mg Intramuscular Given 09/30/22 0155)    ED Course/ Medical Decision Making/ A&P                             Medical Decision Making Amount and/or Complexity of Data Reviewed Labs: ordered. Radiology: ordered.  Risk Prescription drug management.   This patient presents to the ED for concern of abdominal pain, this involves an extensive number of treatment options, and is a complaint that carries with it a high risk of complications and morbidity.  The differential diagnosis includes appendicitis, diverticulitis, UTI, pyelo-    Additional history obtained:  Additional history obtained from N/A External records from outside source obtained and reviewed including general surgery notes   Co morbidities that complicate the patient evaluation  N/A  Social Determinants of Health:  N/A    Lab Tests:  I Ordered, and personally interpreted labs.  The pertinent results include: CBC is unremarkable, BMP shows sodium 130 with a CO2 of 20, glucose 119, UA is unremarkable D-dimer negative   Imaging Studies ordered:  I ordered imaging studies including limited ultrasound I independently visualized and interpreted imaging which showed slightly thickened gallbladder wall without evidence of acute cholecystitis I agree with the radiologist interpretation   Cardiac Monitoring:  The patient was maintained on a cardiac monitor.  I personally viewed and interpreted the cardiac monitored which showed an underlying rhythm of: N/A   Medicines ordered and prescription drug management:  I ordered medication including Bentyl I have reviewed the patients home medicines and have made adjustments as needed  Critical Interventions:  N/A   Reevaluation:  Presents with abdominal pain, triage obtain basic lab work which I have personally reviewed is unremarkable, on my exam patient had a benign physical  exam, pain mainly in the right lower quadrant recommend rectal exam for rule out of large stool burden requiring enema  Rectal exam was benign there is no large stool burden present, patient is now endorsing right upper quadrant tenderness, states he has some difficulty breathing, reviewed his chart he has not had a cholecystectomy nor is he had a lipid ultrasound performed recently, will add  on a D-dimer and obtain liver ultrasound for further evaluation  D-dimer is negative but ultrasound shows gallbladder wall thickening, possible for chronic cholecystitis, will add on hepatic panel as well as a lipase for further evaluation.  Lab work is unremarkable, tolerating p.o., agreeable for discharge at this time.      Consultations Obtained:  N/A    Test Considered:  CT AP-deferred as my suspicion for intra-abdominal abnormality is low at this time his nonsurgical abdomen lab work is all unremarkable patient had a negative CT scan 2 days ago.    Rule out low suspicion for lower lobe pneumonia as lung sounds are clear bilaterally, will defer imaging at this time.  Suspicion for PE is also low as he has a negative D-dimer, he is nontachycardic, not hypoxic, he has no increased risk factors i.e. recent surgeries long immobilization not on hormone therapy.  Suspicion for hepatic abnormality is low nonjaundiced appearing, no elevation liver signs alk phos or T. bili.  I doubt acute cholecystitis/ acalculous cholecystitis as he has no elevation in liver enzymes, alk phos T. bili, no leukocytosis, ultrasound is negative for acute cholecystitis, negative Murphy sign, presentation is atypical as he is nontoxic-appearing vital signs are reassuring. Imaging Does show some gallbladder wall thickening which I suspect is more inflammatory possible from hepatic cyst seen on ultrasound..  Low suspicion for pancreatitis as lipase is within normal limits.  Low suspicion for ruptured stomach ulcer as she has no  peritoneal sign present on exam.  Suspicion for bowel obstruction is also low abdomen is nondistended, soft, still passing gas, patient recent CT imaging performed 3 days ago which was unremarkable.  I doubt complicated diverticulitis as he is nontoxic-appearing vital signs are reassuring no leukocytosis.  I doubt UTI Pilo or kidney stone as UA is negative signs infection or hematuria.   Dispostion and problem list  After consideration of the diagnostic results and the patients response to treatment, I feel that the patent would benefit from discharge.  Right lower quadrant tenderness-patient acute on chronic, possibly from constipation, lab continue with laxatives and follow-up with general surgery. Gallbladder wall thickening-unclear significance by suspect more inflammatory, and follow-up with his general surgeon for further evaluation and strict return precautions.            Final Clinical Impression(s) / ED Diagnoses Final diagnoses:  Right lower quadrant abdominal pain  Thickening of wall of gallbladder    Rx / DC Orders ED Discharge Orders          Ordered    dicyclomine (BENTYL) 20 MG tablet  2 times daily PRN        09/30/22 0414              Marcello Fennel, PA-C 09/30/22 9147    Merryl Hacker, MD 10/05/22 [phone]

## 2022-10-02 ENCOUNTER — Emergency Department (HOSPITAL_COMMUNITY)
Admission: EM | Admit: 2022-10-02 | Discharge: 2022-10-02 | Disposition: A | Discharge status: Home / Self Care | Payer: Medicare Other | Source: Home / Self Care | Attending: Emergency Medicine | Admitting: Emergency Medicine

## 2022-10-02 ENCOUNTER — Emergency Department (HOSPITAL_COMMUNITY)
Admission: EM | Admit: 2022-10-02 | Discharge: 2022-10-02 | Disposition: A | Discharge status: Home / Self Care | Payer: Medicare Other | Attending: Emergency Medicine | Admitting: Emergency Medicine

## 2022-10-02 ENCOUNTER — Encounter (HOSPITAL_COMMUNITY): Payer: Self-pay

## 2022-10-02 ENCOUNTER — Emergency Department (HOSPITAL_COMMUNITY): Payer: Medicare Other

## 2022-10-02 ENCOUNTER — Other Ambulatory Visit: Payer: Self-pay

## 2022-10-02 DIAGNOSIS — E871 Hypo-osmolality and hyponatremia: Secondary | ICD-10-CM | POA: Diagnosis not present

## 2022-10-02 DIAGNOSIS — R1033 Periumbilical pain: Secondary | ICD-10-CM | POA: Diagnosis not present

## 2022-10-02 DIAGNOSIS — R1031 Right lower quadrant pain: Secondary | ICD-10-CM | POA: Diagnosis not present

## 2022-10-02 DIAGNOSIS — Y900 Blood alcohol level of less than 20 mg/100 ml: Secondary | ICD-10-CM | POA: Diagnosis not present

## 2022-10-02 DIAGNOSIS — Z79899 Other long term (current) drug therapy: Secondary | ICD-10-CM | POA: Diagnosis not present

## 2022-10-02 DIAGNOSIS — R519 Headache, unspecified: Secondary | ICD-10-CM | POA: Insufficient documentation

## 2022-10-02 DIAGNOSIS — R1032 Left lower quadrant pain: Secondary | ICD-10-CM | POA: Insufficient documentation

## 2022-10-02 DIAGNOSIS — F419 Anxiety disorder, unspecified: Secondary | ICD-10-CM | POA: Diagnosis not present

## 2022-10-02 DIAGNOSIS — R682 Dry mouth, unspecified: Secondary | ICD-10-CM | POA: Insufficient documentation

## 2022-10-02 DIAGNOSIS — F1310 Sedative, hypnotic or anxiolytic abuse, uncomplicated: Secondary | ICD-10-CM | POA: Diagnosis not present

## 2022-10-02 DIAGNOSIS — R4789 Other speech disturbances: Secondary | ICD-10-CM | POA: Insufficient documentation

## 2022-10-02 DIAGNOSIS — R6889 Other general symptoms and signs: Secondary | ICD-10-CM

## 2022-10-02 LAB — DIFFERENTIAL
Abs Immature Granulocytes: 0.02 10*3/uL (ref 0.00–0.07)
Basophils Absolute: 0 10*3/uL (ref 0.0–0.1)
Basophils Relative: 1 %
Eosinophils Absolute: 0 10*3/uL (ref 0.0–0.5)
Eosinophils Relative: 1 %
Immature Granulocytes: 0 %
Lymphocytes Relative: 35 %
Lymphs Abs: 1.8 10*3/uL (ref 0.7–4.0)
Monocytes Absolute: 0.4 10*3/uL (ref 0.1–1.0)
Monocytes Relative: 8 %
Neutro Abs: 3 10*3/uL (ref 1.7–7.7)
Neutrophils Relative %: 55 %

## 2022-10-02 LAB — COMPREHENSIVE METABOLIC PANEL
ALT: 21 U/L (ref 0–44)
AST: 26 U/L (ref 15–41)
Albumin: 4.1 g/dL (ref 3.5–5.0)
Alkaline Phosphatase: 55 U/L (ref 38–126)
Anion gap: 7 (ref 5–15)
BUN: 14 mg/dL (ref 8–23)
CO2: 22 mmol/L (ref 22–32)
Calcium: 9 mg/dL (ref 8.9–10.3)
Chloride: 102 mmol/L (ref 98–111)
Creatinine, Ser: 0.74 mg/dL (ref 0.61–1.24)
GFR, Estimated: >60 mL/min (ref >60)
Glucose, Bld: 100 mg/dL — ABNORMAL HIGH (ref 70–99)
Potassium: 3.6 mmol/L (ref 3.5–5.1)
Sodium: 131 mmol/L — ABNORMAL LOW (ref 135–145)
Total Bilirubin: 0.9 mg/dL (ref 0.3–1.2)
Total Protein: 6.6 g/dL (ref 6.5–8.1)

## 2022-10-02 LAB — TROPONIN I (HIGH SENSITIVITY)
Troponin I (High Sensitivity): 5 ng/L (ref <18)
Troponin I (High Sensitivity): 5 ng/L (ref <18)

## 2022-10-02 LAB — CBC
HCT: 37.8 % — ABNORMAL LOW (ref 39.0–52.0)
Hemoglobin: 13.1 g/dL (ref 13.0–17.0)
MCH: 31.3 pg (ref 26.0–34.0)
MCHC: 34.7 g/dL (ref 30.0–36.0)
MCV: 90.2 fL (ref 80.0–100.0)
Platelets: 198 10*3/uL (ref 150–400)
RBC: 4.19 MIL/uL — ABNORMAL LOW (ref 4.22–5.81)
RDW: 13.2 % (ref 11.5–15.5)
WBC: 5.3 10*3/uL (ref 4.0–10.5)
nRBC: 0 % (ref 0.0–0.2)

## 2022-10-02 LAB — LIPASE, BLOOD: Lipase: 45 U/L (ref 11–51)

## 2022-10-02 LAB — RAPID URINE DRUG SCREEN, HOSP PERFORMED
Amphetamines: NOT DETECTED
Barbiturates: NOT DETECTED
Benzodiazepines: POSITIVE — AB
Cocaine: NOT DETECTED
Opiates: NOT DETECTED
Tetrahydrocannabinol: NOT DETECTED

## 2022-10-02 LAB — ETHANOL: Alcohol, Ethyl (B): <10 mg/dL (ref <10)

## 2022-10-02 MED ORDER — DIPHENHYDRAMINE HCL 50 MG/ML IJ SOLN
12.5000 mg | Freq: Once | INTRAMUSCULAR | Status: AC
  Administered 2022-10-02: 12.5 mg via INTRAVENOUS
  Filled 2022-10-02: qty 1

## 2022-10-02 MED ORDER — ACETAMINOPHEN 325 MG PO TABS
650.0000 mg | ORAL_TABLET | Freq: Once | ORAL | Status: DC
  Filled 2022-10-02: qty 2

## 2022-10-02 MED ORDER — DIAZEPAM 5 MG/ML IJ SOLN
2.5000 mg | Freq: Once | INTRAMUSCULAR | Status: AC
  Administered 2022-10-02: 2.5 mg via INTRAVENOUS
  Filled 2022-10-02: qty 2

## 2022-10-02 MED ORDER — DROPERIDOL 2.5 MG/ML IJ SOLN
2.5000 mg | Freq: Once | INTRAMUSCULAR | Status: AC
  Administered 2022-10-02: 2.5 mg via INTRAVENOUS
  Filled 2022-10-02: qty 2

## 2022-10-02 MED ORDER — SODIUM CHLORIDE 0.9 % IV BOLUS
1000.0000 mL | Freq: Once | INTRAVENOUS | Status: AC
  Administered 2022-10-02: 1000 mL via INTRAVENOUS

## 2022-10-02 NOTE — Discharge Instructions (Addendum)
Because you declined to have an MRI, we could not complete your work up. You will need to follow up with your primary care doctor for an MRI of the brain. I believe strongly that your complaints are somatic in nature and you would benefit highly from reevaluation by your psychiatrist.  Your labs are otherwise not concerning for any emergency causes of your myriad complaints.  You have soft stool in your rectum, you may need more fluids and fiber. Consider a stimulant or osmotic laxiative like miralax.

## 2022-10-02 NOTE — ED Triage Notes (Signed)
Pt BIBA from home. Pt states yesterday she had a cleaning crew come to his house that irritated pt and cleaned "incorrectly". Laundry was done with too much detergent and stated this would cause a stroke, leading to left sided numbness. No deficits noted.  Pt c/o headache and chronic LLQ pain from hernia.  Pt states that he is unsafe at home, although he lives alone. Pt states he needs help at home. House was clean and tidy per EMS.  90 CBG

## 2022-10-02 NOTE — ED Provider Notes (Signed)
[place] EMERGENCY DEPARTMENT AT [facility] Provider Note   CSN: [id number] Arrival date & time: 10/02/22  1727     History  Chief Complaint  Patient presents with   Anxiety    Brad [name] is a 67 y.o. male.  He has past medical history of anxiety, insomnia, left inguinal hernia repair in 2012.  He presents to the ED at this time for feeling like he has to use his chest muscles to breathe and having some anxiety.  He was here earlier for multiple complaints and had an extremely detailed workup for his shortness of breath including a cardiac workup with troponins, he also had CT for complaints of left arm weakness that has resolved and was even offered MRI which he refused. He states that he expected the breathing to be better but at this time which is why he returned.  He denies any worsening of symptoms.  Anxiety       Home Medications Prior to Admission medications   Medication Sig Start Date End Date Taking? Authorizing Provider  Alpha-Lipoic Acid 600 MG CAPS Take 1 capsule by mouth daily.    [provider]  amLODipine (NORVASC) 5 MG tablet Take 2 tablets (10 mg total) by mouth daily. Patient taking differently: Take 5 mg by mouth daily. 03/02/19   Johnn Hai, MD  atorvastatin (LIPITOR) 10 MG tablet Take 10 mg by mouth 3 (three) times a week. MWF 08/10/19   [provider]  busPIRone (BUSPAR) 10 MG tablet Take 20 mg by mouth 3 (three) times daily.     [provider]  Cholecalciferol (VITAMIN D3) 125 MCG (5000 UT) TABS Take 1 tablet by mouth daily.    [provider]  Coenzyme Q10 50 MG CAPS Take 1 capsule by mouth daily.    [provider]  dicyclomine (BENTYL) 20 MG tablet Take 1 tablet (20 mg total) by mouth 2 (two) times daily as needed for spasms. 09/30/22   Marcello Fennel, PA-C  docusate sodium (COLACE) 100 MG capsule Take 1 capsule (100 mg total) by mouth every 12 (twelve) hours. 09/26/22   Valarie Merino, MD   LORazepam (ATIVAN) 1 MG tablet Take 1 mg by mouth 2 (two) times daily. 10/26/18   [provider]  losartan (COZAAR) 100 MG tablet Take 100 mg by mouth daily. 02/04/20   [provider]  memantine (NAMENDA) 10 MG tablet Take 1 tablet (10 mg total) by mouth 2 (two) times daily. Patient not taking: Reported on 08/20/2022 03/02/19   Johnn Hai, MD  Methylfol-Methylcob-Acetylcyst (CEREFOLIN NAC) 6-2-600 MG TABS 1 a day Patient not taking: Reported on 08/20/2022 03/02/19   Johnn Hai, MD  mirtazapine (REMERON) 30 MG tablet Take 30 mg by mouth at bedtime.    [provider]  Multiple Vitamin (MULTIVITAMIN) tablet Take 1 tablet by mouth daily.    [provider]  Omega-3 1000 MG CAPS Take 1 capsule by mouth daily.    [provider]  oxyCODONE (OXY IR/ROXICODONE) 5 MG immediate release tablet Take 1 tablet (5 mg total) by mouth every 6 (six) hours as needed for severe pain or breakthrough pain. Patient not taking: Reported on 09/30/2022 08/23/22   Michael Boston, MD  polyethylene glycol (MIRALAX) 17 g packet Take 17 g by mouth daily. 08/26/22   Rancour, Annie Main, MD  pregabalin (LYRICA) 75 MG capsule Take 75 mg by mouth 2 (two) times daily.    [provider]  Prenatal Vit-Fe  Fumarate-FA (PRENATAL MULTIVITAMIN) TABS tablet Take 1 tablet by mouth daily. Patient not taking: Reported on 08/20/2022 03/03/19   Johnn Hai, MD  traZODone (DESYREL) 50 MG tablet Take 50 mg by mouth at bedtime.    [provider]  zaleplon (SONATA) 10 MG capsule Take 10 mg by mouth at bedtime as needed for sleep.    [provider]  zinc gluconate 50 MG tablet Take 50 mg by mouth daily.    [provider]  zolpidem (AMBIEN CR) 12.5 MG CR tablet Take 12.5 mg by mouth at bedtime.     [provider]      Allergies    Duloxetine hcl, Gabapentin, and Trileptal [oxcarbazepine]    Review of Systems   Review of Systems  Physical Exam Updated  Vital Signs There were no vitals taken for this visit. Physical Exam Vitals and nursing note reviewed.  Constitutional:      General: He is not in acute distress.    Appearance: He is well-developed.  HENT:     Head: Normocephalic and atraumatic.     Mouth/Throat:     Mouth: Mucous membranes are moist.  Eyes:     Conjunctiva/sclera: Conjunctivae normal.  Cardiovascular:     Rate and Rhythm: Normal rate and regular rhythm.     Heart sounds: No murmur heard. Pulmonary:     Effort: Pulmonary effort is normal. No respiratory distress.     Breath sounds: Normal breath sounds.  Abdominal:     Palpations: Abdomen is soft.     Tenderness: There is no abdominal tenderness.  Musculoskeletal:        General: No swelling.     Cervical back: Neck supple.     Comments: Small hematoma left forearm at previous IV site, no bleeding mild tenderness  Skin:    General: Skin is warm and dry.     Capillary Refill: Capillary refill takes less than 2 seconds.  Neurological:     General: No focal deficit present.     Mental Status: He is alert and oriented to person, place, and time.     Cranial Nerves: No cranial nerve deficit.     Sensory: No sensory deficit.     Motor: No weakness.     Gait: Gait normal.  Psychiatric:        Mood and Affect: Mood normal.     ED Results / Procedures / Treatments   Labs (all labs ordered are listed, but only abnormal results are displayed) Labs Reviewed - No data to display  EKG None  Radiology CT HEAD WO CONTRAST  Result Date: 10/02/2022 CLINICAL DATA:  Headache EXAM: CT HEAD WITHOUT CONTRAST TECHNIQUE: Contiguous axial images were obtained from the base of the skull through the vertex without intravenous contrast. RADIATION DOSE REDUCTION: This exam was performed according to the departmental dose-optimization program which includes automated exposure control, adjustment of the mA and/or kV according to patient size and/or use of iterative reconstruction  technique. COMPARISON:  CT Head 02/26/19 FINDINGS: Brain: No hemorrhage. No CT evidence of an acute infarct. No hydrocephalus. No extra-axial fluid collection. No midline shift. Vascular: No hyperdense vessel or unexpected calcification. Skull: Normal. Negative for fracture or focal lesion. Sinuses/Orbits: No mastoid or middle ear effusion. Foramina cerumen in the left EAC. Paranasal sinuses are clear. Orbits are unremarkable. Other: None. IMPRESSION: No specific etiology for headaches or altered mental status identified. Electronically Signed   By: Marin Roberts M.D.   On: 10/02/2022 11:02  DG Chest Port 1 View  Result Date: 10/02/2022 CLINICAL DATA:  Patient complains of pain in lungs after cleaning croup came to his house. EXAM: PORTABLE CHEST 1 VIEW COMPARISON:  09/10/2022 FINDINGS: Stable cardiomediastinal contours. Heart size is normal. No pleural effusion or edema. No airspace opacities identified. Thoracic scoliosis with convexity towards the right. No acute osseous abnormality. Stable ACDF within the lower cervical spine IMPRESSION: 1. No acute cardiopulmonary abnormalities. 2. Thoracic scoliosis. Electronically Signed   By: Kerby Moors M.D.   On: 10/02/2022 10:11    Procedures Procedures    Medications Ordered in ED Medications - No data to display  ED Course/ Medical Decision Making/ A&P                             Medical Decision Making Ddx: anxiety, asthma, COPD, other ED course: Patient was seen earlier for this shortness of breath along with concerns for left arm weakness and multiple other complaints.  He had extensive workup earlier for the same and states his does not feel any different at this time. He does report history of anxiety and states he takes buspirone and Ativan for this. On exam he has full clear breath sounds he is anxious, but overall appearance and has very reassuring vital signs.  I discussed with him his workup earlier was very thorough and that his  shortness of breath is likely due to his anxiety.  He already has medications for this at home.  He agreed that his workup was extensive.  He stated "I guess I can get out of your hair now".  Recommended following up with allergist and PCP and come back if he has any worrisome new changes.           Final Clinical Impression(s) / ED Diagnoses Final diagnoses:  Anxiety    Rx / DC Orders ED Discharge Orders     None         Darci Current 10/02/22 Everrett Coombe, MD 10/02/22 2032

## 2022-10-02 NOTE — ED Notes (Addendum)
Pt provided d/c papers. Pt eating a meal tray. Pt states that he is not well enough to go home. Pt refused MRI and reminded of such. Pt states he cannot leave. Pt informed he will have fifteen minutes to eat then will need to vacate the room.

## 2022-10-02 NOTE — Discharge Instructions (Signed)
Follow-up with your primary care doctor as well as your psychiatrist.  You had extensive workup earlier today that was very reassuring.  You do not need any further testing at this time. You can come back for any new or worsening symptoms.

## 2022-10-02 NOTE — ED Triage Notes (Signed)
Patient BIB [facility] from home. Just discharged earlier today. Complaining of anxiety and shortness of breath.

## 2022-10-02 NOTE — ED Provider Notes (Cosign Needed Addendum)
[facility] Provider Note   CSN: [id number] Arrival date & time: 10/02/22  3825     History  Chief Complaint  Patient presents with   Headache    Brad [name] is a 67 y.o. male who presents to the emergency department with a slew of complaints.  The patient was previously a Engineer, water.  He has a PhD in psychology. 1) history taken at bedside from EMS who reports that last night patient was having his house cleaned by house cleaners and stated that they put too much laundry detergent in his clothing and broke his refrigerator because they put too much food in the refrigerator.  He began to feel that he was having "a stroke."  He reports that he felt numbness and weakness in his left face and left arm.  His housekeeper suggested he take a "nerve pill" which the patient did and then went to sleep.  Patient states "I know I had a stroke." 2) patient reports a 35 pound weight loss since October of last year.  He attributes this to a hernia which she had repaired in December and states that he still has "severe pain."  He reports that he has symptoms of early satiety and that he cannot eat because "I cannot make a bowel movement.  He denies nausea or vomiting but states that he feels dehydrated. 3) patient states that he cannot breathe and that his lungs do not work normally.  He says that he cannot use his muscles to move his chest and that his chest is hurting. 4) patient had an IV placed and immediately began having severe pain in his arm.  He feels that he has "nerve damage." 5) patient denies any psychiatric illness but states he does have anxiety. 6) patient states that he does not feel like he can live alone or take care of himself anymore  Review of EMR shows that patient was in the emergency department 4 days ago with abdominal issues had a CT scan that was completely negative.   Headache      Home Medications Prior to Admission  medications   Medication Sig Start Date End Date Taking? Authorizing Provider  Alpha-Lipoic Acid 600 MG CAPS Take 1 capsule by mouth daily.    [provider]  amLODipine (NORVASC) 5 MG tablet Take 2 tablets (10 mg total) by mouth daily. Patient taking differently: Take 5 mg by mouth daily. 03/02/19   Johnn Hai, MD  atorvastatin (LIPITOR) 10 MG tablet Take 10 mg by mouth 3 (three) times a week. MWF 08/10/19   [provider]  busPIRone (BUSPAR) 10 MG tablet Take 20 mg by mouth 3 (three) times daily.     [provider]  Cholecalciferol (VITAMIN D3) 125 MCG (5000 UT) TABS Take 1 tablet by mouth daily.    [provider]  Coenzyme Q10 50 MG CAPS Take 1 capsule by mouth daily.    [provider]  dicyclomine (BENTYL) 20 MG tablet Take 1 tablet (20 mg total) by mouth 2 (two) times daily as needed for spasms. 09/30/22   Marcello Fennel, PA-C  docusate sodium (COLACE) 100 MG capsule Take 1 capsule (100 mg total) by mouth every 12 (twelve) hours. 09/26/22   Valarie Merino, MD  LORazepam (ATIVAN) 1 MG tablet Take 1 mg by mouth 2 (two) times daily. 10/26/18   [provider]  losartan (COZAAR) 100 MG tablet Take 100 mg by mouth daily.  02/04/20   [provider]  memantine (NAMENDA) 10 MG tablet Take 1 tablet (10 mg total) by mouth 2 (two) times daily. Patient not taking: Reported on 08/20/2022 03/02/19   Johnn Hai, MD  Methylfol-Methylcob-Acetylcyst (CEREFOLIN NAC) 6-2-600 MG TABS 1 a day Patient not taking: Reported on 08/20/2022 03/02/19   Johnn Hai, MD  mirtazapine (REMERON) 30 MG tablet Take 30 mg by mouth at bedtime.    [provider]  Multiple Vitamin (MULTIVITAMIN) tablet Take 1 tablet by mouth daily.    [provider]  Omega-3 1000 MG CAPS Take 1 capsule by mouth daily.    [provider]  oxyCODONE (OXY IR/ROXICODONE) 5 MG immediate release tablet Take 1 tablet (5 mg total) by mouth every 6 (six) hours  as needed for severe pain or breakthrough pain. Patient not taking: Reported on 09/30/2022 08/23/22   Michael Boston, MD  polyethylene glycol (MIRALAX) 17 g packet Take 17 g by mouth daily. 08/26/22   Rancour, Annie Main, MD  pregabalin (LYRICA) 75 MG capsule Take 75 mg by mouth 2 (two) times daily.    [provider]  Prenatal Vit-Fe Fumarate-FA (PRENATAL MULTIVITAMIN) TABS tablet Take 1 tablet by mouth daily. Patient not taking: Reported on 08/20/2022 03/03/19   Johnn Hai, MD  traZODone (DESYREL) 50 MG tablet Take 50 mg by mouth at bedtime.    [provider]  zaleplon (SONATA) 10 MG capsule Take 10 mg by mouth at bedtime as needed for sleep.    [provider]  zinc gluconate 50 MG tablet Take 50 mg by mouth daily.    [provider]  zolpidem (AMBIEN CR) 12.5 MG CR tablet Take 12.5 mg by mouth at bedtime.     [provider]      Allergies    Duloxetine hcl, Gabapentin, and Trileptal [oxcarbazepine]    Review of Systems   Review of Systems  Neurological:  Positive for headaches.    Physical Exam Updated Vital Signs BP (!) 144/90   Pulse (!) 59   Temp 97.9 F (36.6 C) (Oral)   Resp 16   Ht 5\' 9"  (1.753 m)   Wt 63.5 kg   SpO2 99%   BMI 20.67 kg/m  Physical Exam Vitals and nursing note reviewed. Exam conducted with a chaperone present.  Constitutional:      Appearance: He is underweight. He is not ill-appearing or toxic-appearing.  HENT:     Head: Normocephalic and atraumatic.     Right Ear: Tympanic membrane normal.     Mouth/Throat:     Mouth: Mucous membranes are dry.  Eyes:     General: No visual field deficit.    Extraocular Movements: Extraocular movements intact.     Pupils: Pupils are equal, round, and reactive to light.  Cardiovascular:     Rate and Rhythm: Normal rate.     Pulses: Normal pulses.     Heart sounds: Normal heart sounds.  Pulmonary:     Effort: Pulmonary effort is normal.     Breath sounds: Normal  breath sounds.  Abdominal:     General: Abdomen is flat.     Tenderness: There is abdominal tenderness in the right lower quadrant, periumbilical area, suprapubic area and left lower quadrant. There is guarding (voluntary).  Genitourinary:    Comments: Digital Rectal Exam reveals sphincter with good tone. No external hemorrhoids. No masses or fissures. Stool color is brown with no overt blood.  Musculoskeletal:     Cervical back: Normal  range of motion and neck supple.     Right lower leg: No edema.     Left lower leg: No edema.  Neurological:     Mental Status: He is alert.     Cranial Nerves: Cranial nerves 2-12 are intact. No cranial nerve deficit, dysarthria or facial asymmetry.     Sensory: No sensory deficit.     Motor: Motor function is intact. No weakness.  Psychiatric:        Mood and Affect: Mood is anxious.        Speech: Speech is delayed and tangential.     ED Results / Procedures / Treatments   Labs (all labs ordered are listed, but only abnormal results are displayed) Labs Reviewed  CBC  DIFFERENTIAL  COMPREHENSIVE METABOLIC PANEL  ETHANOL  RAPID URINE DRUG SCREEN, HOSP PERFORMED  LIPASE, BLOOD  TROPONIN I (HIGH SENSITIVITY)    EKG EKG Interpretation  Date/Time:  Saturday October 02 2022 09:32:54 EST Ventricular Rate:  58 PR Interval:  159 QRS Duration: 106 QT Interval:  429 QTC Calculation: 422 R Axis:   -66 Text Interpretation: Sinus rhythm Abnormal R-wave progression, early transition Baseline wander in lead(s) V6 Confirmed by Gloris Manchester ([id number]) on 10/02/2022 9:48:51 AM  Radiology No results found.  Procedures Procedures    Medications Ordered in ED Medications  sodium chloride 0.9 % bolus 1,000 mL (1,000 mLs Intravenous New Bag/Given (Non-Interop) 10/02/22 0935)    ED Course/ Medical Decision Making/ A&P                             Medical Decision Making 67 year old male who presents emergency department due with a myriad of complaints  ranging from TIA symptoms yesterday which have resolved, chest pain, nerve pain, arm pain, chest pain, shortness of breath, abdominal pain, constipation.  Review of EMR shows that these all appear to be chronic problems.  Primarily the patient is tangential.  He does not allow you to discuss 1 symptom before he has a complaint about another symptom midsentence. He has obvious difficulty excepting any explanation for his symptoms and and offered solutions becomes upset and finds another complaint to discuss.  He seems to ruminate and obsess about the symptoms he is having in his body.  I discussed his workup today that included cardiac enzymes with 2 negative troponins, UDS which shows positive benzos, CBC which shows no acute abnormalities.  CMP showed some mild hyponatremia.  He was given fluids IV including normal saline.  Ethanol lipase both within normal limits.  He had a head CT that was negative.   Patient had a very superficial and peripheral right mid forearm IV placed and is convinced that we have done permanent nerve damage to his arm.  I discussed with the patient that this is not in a location which could cause some symptoms.  He did not except this as a explanation.  Patient continues to state that he cannot live at home by himself.  He was offered an MRI but declined to this.  Given his history of stating he had some numbness and weakness on his left side this would complete a workup today however he states that the MRI is "too loud."  I have directly discussed with the patient my concern for somatoform disorder and anxiety.  I have reviewed his EMR and it appears that in the past his primary care doctor has also discussed this with him and referred  him to psychiatry.  The patient states he sees a psychiatrist and that "this is not in my head."  I do not think there are any emergent cause however have not been able to complete the workup due to patient refusal of further workup.  He was  given droperidol, fluids, Valium, Benadryl without significant change in his constant rumination and complaint about a multitude of symptoms.  He had a CT scan of his abdomen 2 days ago.  I did a rectal exam that showed soft stool in his rectum and no evidence of fecal impaction.  I have suggested that he take a laxative and eat more foods.  He did appear somewhat dehydrated but is hyperventilating and I do not think that this is from a physiologic cause but a psychiatric one.  I have referred the patient back to primary care, outpatient neurology and psychiatry.  He does not appear to have any emergent cause of his symptoms today  Amount and/or Complexity of Data Reviewed Labs: ordered. Radiology: ordered and independent interpretation performed. ECG/medicine tests: ordered and independent interpretation performed.    Details: Sinus rhythm at a rate of 58  Risk OTC drugs. Prescription drug management.           Final Clinical Impression(s) / ED Diagnoses Final diagnoses:  None    Rx / DC Orders ED Discharge Orders     None         Margarita Mail, PA-C 10/02/22 1344    Margarita Mail, PA-C 10/02/22 1358    Godfrey Pick, MD 10/06/22 [phone]

## 2022-10-02 NOTE — ED Notes (Signed)
Pt continues to state he is having nerve pain in his right arm and hand, as well as his face from the IV. IV is still functioning without any issues, but removed for pt comfort.

## 2022-10-02 NOTE — ED Notes (Signed)
Pt c/o pain with IV insertion. IV has great return, no swelling. Pt states that his arm is hurting. IV site inspected thoroughly, fluids infusing appropriately, no infiltration . Offered to move IV for pt, but informed pt this was not indicated. Pt declined.

## 2022-10-03 ENCOUNTER — Other Ambulatory Visit: Payer: Self-pay

## 2022-10-03 ENCOUNTER — Encounter (HOSPITAL_COMMUNITY): Payer: Self-pay

## 2022-10-03 ENCOUNTER — Emergency Department (HOSPITAL_COMMUNITY)
Admission: EM | Admit: 2022-10-03 | Discharge: 2022-10-04 | Disposition: A | Discharge status: Home / Self Care | Payer: Medicare Other | Attending: Emergency Medicine | Admitting: Emergency Medicine

## 2022-10-03 DIAGNOSIS — K5909 Other constipation: Secondary | ICD-10-CM | POA: Diagnosis not present

## 2022-10-03 DIAGNOSIS — R109 Unspecified abdominal pain: Secondary | ICD-10-CM | POA: Diagnosis present

## 2022-10-03 DIAGNOSIS — F419 Anxiety disorder, unspecified: Secondary | ICD-10-CM | POA: Insufficient documentation

## 2022-10-03 DIAGNOSIS — Z79899 Other long term (current) drug therapy: Secondary | ICD-10-CM | POA: Diagnosis not present

## 2022-10-03 DIAGNOSIS — Z20822 Contact with and (suspected) exposure to covid-19: Secondary | ICD-10-CM | POA: Insufficient documentation

## 2022-10-03 DIAGNOSIS — R000 Tachycardia, unspecified: Secondary | ICD-10-CM | POA: Insufficient documentation

## 2022-10-03 DIAGNOSIS — R509 Fever, unspecified: Secondary | ICD-10-CM | POA: Diagnosis not present

## 2022-10-03 LAB — COMPREHENSIVE METABOLIC PANEL
ALT: 19 U/L (ref 0–44)
AST: 22 U/L (ref 15–41)
Albumin: 3.9 g/dL (ref 3.5–5.0)
Alkaline Phosphatase: 52 U/L (ref 38–126)
Anion gap: 10 (ref 5–15)
BUN: 18 mg/dL (ref 8–23)
CO2: 22 mmol/L (ref 22–32)
Calcium: 9.1 mg/dL (ref 8.9–10.3)
Chloride: 102 mmol/L (ref 98–111)
Creatinine, Ser: 0.9 mg/dL (ref 0.61–1.24)
GFR, Estimated: >60 mL/min (ref >60)
Glucose, Bld: 96 mg/dL (ref 70–99)
Potassium: 3.9 mmol/L (ref 3.5–5.1)
Sodium: 134 mmol/L — ABNORMAL LOW (ref 135–145)
Total Bilirubin: 0.8 mg/dL (ref 0.3–1.2)
Total Protein: 6.4 g/dL — ABNORMAL LOW (ref 6.5–8.1)

## 2022-10-03 LAB — CBC
HCT: 35.3 % — ABNORMAL LOW (ref 39.0–52.0)
Hemoglobin: 12.4 g/dL — ABNORMAL LOW (ref 13.0–17.0)
MCH: 31.7 pg (ref 26.0–34.0)
MCHC: 35.1 g/dL (ref 30.0–36.0)
MCV: 90.3 fL (ref 80.0–100.0)
Platelets: 191 10*3/uL (ref 150–400)
RBC: 3.91 MIL/uL — ABNORMAL LOW (ref 4.22–5.81)
RDW: 13.3 % (ref 11.5–15.5)
WBC: 6.6 10*3/uL (ref 4.0–10.5)
nRBC: 0 % (ref 0.0–0.2)

## 2022-10-03 MED ORDER — ACETAMINOPHEN 500 MG PO TABS
1000.0000 mg | ORAL_TABLET | Freq: Once | ORAL | Status: AC
  Administered 2022-10-03: 1000 mg via ORAL
  Filled 2022-10-03: qty 2

## 2022-10-03 MED ORDER — DIAZEPAM 5 MG PO TABS
5.0000 mg | ORAL_TABLET | Freq: Once | ORAL | Status: AC
  Administered 2022-10-03: 5 mg via ORAL
  Filled 2022-10-03: qty 1

## 2022-10-03 NOTE — ED Notes (Signed)
Pt. Made aware for the need of urine specimen. 

## 2022-10-03 NOTE — Discharge Instructions (Addendum)
It was our pleasure to provide your ER care today - we hope that you feel better.  If constipated, make sure to drink plenty of fluids/stay well hydrated, get adequate fiber in diet, and you may take colace (stool softener) 2x/day and miralax (laxative) once per day as need - these meds are available over the counter.   Follow up closely with primary care doctor and behavioral health provider in the coming week.  For mental health issues and/or crisis, you may also go directly to the [facility] - they are open 24/7 and walk-ins are welcome.    Return to ER if worse, new symptoms, fevers, new or worsening or severe abdominal pain, persistent vomiting, chest pain, trouble breathing, or other emergency concern.   You were given medication in the ER - no driving for the next 6 hours.

## 2022-10-03 NOTE — ED Triage Notes (Signed)
Patient was here twice yesterday and discharged for the exact same thing. Complaining of abdominal pain that radiates to his rectum.

## 2022-10-03 NOTE — ED Provider Notes (Signed)
[facility]ugen[facility] EMERGENCY DEPARTMENT AT [facility] Provider Note   CSN: [id number] Arrival date & time: 10/03/22  2031     History {Add pertinent medical, surgical, social history, OB history to HPI:1} Chief Complaint  Patient presents with   Abdominal Pain    Brad [name] is a 67 y.o. male.  Patient with hx anxiety and constipation, indicates has had recurrent issues w constipation.  Pt also appears quite anxious.  (Pt with recent ED visits w similar symptoms - severe recent ct studies negative for acute process).  Last bm a couple days ago. No abd distension or vomiting. No focal or constant abd pain, +intermittent cramping. No dysuria or gu c/o.  W anxiety, denies new stressor or event. Denies depression.  Pt denies change in meds, running out of meds or taking new meds or taking meds in over the prescribed amount.  No fever or chills. Pt relatively poor historian - level 5 caveat.   The history is provided by the patient and medical records.  Abdominal Pain Associated symptoms: constipation   Associated symptoms: no chest pain, no cough, no dysuria, no fever, no shortness of breath, no sore throat and no vomiting        Home Medications Prior to Admission medications   Medication Sig Start Date End Date Taking? Authorizing Provider  Alpha-Lipoic Acid 600 MG CAPS Take 1 capsule by mouth daily.    [provider]  amLODipine (NORVASC) 5 MG tablet Take 2 tablets (10 mg total) by mouth daily. Patient taking differently: Take 5 mg by mouth daily. 03/02/19   Malvin Johns, MD  atorvastatin (LIPITOR) 10 MG tablet Take 10 mg by mouth 3 (three) times a week. MWF 08/10/19   [provider]  busPIRone (BUSPAR) 10 MG tablet Take 20 mg by mouth 3 (three) times daily.     [provider]  Cholecalciferol (VITAMIN D3) 125 MCG (5000 UT) TABS Take 1 tablet by mouth daily.    [provider]  Coenzyme Q10 50 MG CAPS Take 1 capsule by mouth daily.     [provider]  dicyclomine (BENTYL) 20 MG tablet Take 1 tablet (20 mg total) by mouth 2 (two) times daily as needed for spasms. 09/30/22   Carroll Sage, PA-C  docusate sodium (COLACE) 100 MG capsule Take 1 capsule (100 mg total) by mouth every 12 (twelve) hours. 09/26/22   Wynetta Fines, MD  LORazepam (ATIVAN) 1 MG tablet Take 1 mg by mouth 2 (two) times daily. 10/26/18   [provider]  losartan (COZAAR) 100 MG tablet Take 100 mg by mouth daily. 02/04/20   [provider]  memantine (NAMENDA) 10 MG tablet Take 1 tablet (10 mg total) by mouth 2 (two) times daily. Patient not taking: Reported on 08/20/2022 03/02/19   Malvin Johns, MD  Methylfol-Methylcob-Acetylcyst (CEREFOLIN NAC) 6-2-600 MG TABS 1 a day Patient not taking: Reported on 08/20/2022 03/02/19   Malvin Johns, MD  mirtazapine (REMERON) 30 MG tablet Take 30 mg by mouth at bedtime.    [provider]  Multiple Vitamin (MULTIVITAMIN) tablet Take 1 tablet by mouth daily.    [provider]  Omega-3 1000 MG CAPS Take 1 capsule by mouth daily.    [provider]  oxyCODONE (OXY IR/ROXICODONE) 5 MG immediate release tablet Take 1 tablet (5 mg total) by mouth every 6 (six) hours as needed for severe pain or breakthrough pain. Patient not taking: Reported on 09/30/2022 08/23/22  Michael Boston, MD  polyethylene glycol (MIRALAX) 17 g packet Take 17 g by mouth daily. 08/26/22   Rancour, Annie Main, MD  pregabalin (LYRICA) 75 MG capsule Take 75 mg by mouth 2 (two) times daily.    [provider]  Prenatal Vit-Fe Fumarate-FA (PRENATAL MULTIVITAMIN) TABS tablet Take 1 tablet by mouth daily. Patient not taking: Reported on 08/20/2022 03/03/19   Johnn Hai, MD  traZODone (DESYREL) 50 MG tablet Take 50 mg by mouth at bedtime.    [provider]  zaleplon (SONATA) 10 MG capsule Take 10 mg by mouth at bedtime as needed for sleep.    [provider]  zinc gluconate 50 MG tablet  Take 50 mg by mouth daily.    [provider]  zolpidem (AMBIEN CR) 12.5 MG CR tablet Take 12.5 mg by mouth at bedtime.     [provider]      Allergies    Duloxetine hcl, Gabapentin, and Trileptal [oxcarbazepine]    Review of Systems   Review of Systems  Constitutional:  Negative for fever.  HENT:  Negative for ear pain, sinus pain and sore throat.   Eyes:  Negative for discharge and redness.  Respiratory:  Negative for cough and shortness of breath.   Cardiovascular:  Negative for chest pain and leg swelling.  Gastrointestinal:  Positive for abdominal pain and constipation. Negative for vomiting.  Genitourinary:  Negative for dysuria, scrotal swelling and testicular pain.  Musculoskeletal:  Negative for back pain.  Skin:  Negative for rash.  Neurological:  Negative for headaches.  Psychiatric/Behavioral:  The patient is nervous/anxious.     Physical Exam Updated Vital Signs BP 133/83   Pulse 75   Temp 100.1 F (37.8 C) (Rectal)   Resp 18   Ht 1.753 m (5\' 9" )   Wt 64 kg   SpO2 98%   BMI 20.84 kg/m  Physical Exam Vitals and nursing note reviewed.  Constitutional:      Appearance: He is well-developed.     Comments: Very anxious, calling out.   HENT:     Head: Atraumatic.     Nose: Nose normal.     Mouth/Throat:     Mouth: Mucous membranes are moist.     Pharynx: Oropharynx is clear. No oropharyngeal exudate or posterior oropharyngeal erythema.  Eyes:     General: No scleral icterus.    Conjunctiva/sclera: Conjunctivae normal.     Pupils: Pupils are equal, round, and reactive to light.  Neck:     Trachea: No tracheal deviation.     Comments: No neck stiffness or rigidity. Pt moves neck freely/comfortably in all directions. Thyroid not grossly enlarged or tender. Trachea midline. No cervical l/a.  Cardiovascular:     Rate and Rhythm: Regular rhythm. Tachycardia present.     Pulses: Normal pulses.     Heart sounds: Normal heart sounds. No murmur  heard.    No friction rub. No gallop.  Pulmonary:     Effort: Pulmonary effort is normal. No accessory muscle usage or respiratory distress.     Breath sounds: Normal breath sounds.  Abdominal:     General: Bowel sounds are normal. There is no distension.     Palpations: Abdomen is soft. There is no mass.     Tenderness: There is no abdominal tenderness. There is no guarding.     Hernia: No hernia is present.  Genitourinary:    Comments: No cva tenderness. Normal external gu exam, no scrotal or testicular pain, swelling  or tenderness.  Musculoskeletal:        General: No swelling.     Cervical back: Normal range of motion and neck supple. No rigidity.  Skin:    General: Skin is warm and dry.     Findings: No rash.  Neurological:     Mental Status: He is alert.     Comments: Alert, speech clear. Motor/sens grossly intact bil.   Psychiatric:     Comments: Very anxious appearing     ED Results / Procedures / Treatments   Labs (all labs ordered are listed, but only abnormal results are displayed) Results for orders placed or performed during the hospital encounter of 10/02/22  CBC  Result Value Ref Range   WBC 5.3 4.0 - 10.5 K/uL   RBC 4.19 (L) 4.22 - 5.81 MIL/uL   Hemoglobin 13.1 13.0 - 17.0 g/dL   HCT 64.4 (L) 03.4 - 74.2 %   MCV 90.2 80.0 - 100.0 fL   MCH 31.3 26.0 - 34.0 pg   MCHC 34.7 30.0 - 36.0 g/dL   RDW 59.5 63.8 - 75.6 %   Platelets 198 150 - 400 K/uL   nRBC 0.0 0.0 - 0.2 %  Differential  Result Value Ref Range   Neutrophils Relative % 55 %   Neutro Abs 3.0 1.7 - 7.7 K/uL   Lymphocytes Relative 35 %   Lymphs Abs 1.8 0.7 - 4.0 K/uL   Monocytes Relative 8 %   Monocytes Absolute 0.4 0.1 - 1.0 K/uL   Eosinophils Relative 1 %   Eosinophils Absolute 0.0 0.0 - 0.5 K/uL   Basophils Relative 1 %   Basophils Absolute 0.0 0.0 - 0.1 K/uL   Immature Granulocytes 0 %   Abs Immature Granulocytes 0.02 0.00 - 0.07 K/uL  Comprehensive metabolic panel  Result Value Ref  Range   Sodium 131 (L) 135 - 145 mmol/L   Potassium 3.6 3.5 - 5.1 mmol/L   Chloride 102 98 - 111 mmol/L   CO2 22 22 - 32 mmol/L   Glucose, Bld 100 (H) 70 - 99 mg/dL   BUN 14 8 - 23 mg/dL   Creatinine, Ser 4.33 0.61 - 1.24 mg/dL   Calcium 9.0 8.9 - 29.5 mg/dL   Total Protein 6.6 6.5 - 8.1 g/dL   Albumin 4.1 3.5 - 5.0 g/dL   AST 26 15 - 41 U/L   ALT 21 0 - 44 U/L   Alkaline Phosphatase 55 38 - 126 U/L   Total Bilirubin 0.9 0.3 - 1.2 mg/dL   GFR, Estimated >18 >84 mL/min   Anion gap 7 5 - 15  Ethanol  Result Value Ref Range   Alcohol, Ethyl (B) <10 <10 mg/dL  Urine rapid drug screen (hosp performed)  Result Value Ref Range   Opiates NONE DETECTED NONE DETECTED   Cocaine NONE DETECTED NONE DETECTED   Benzodiazepines POSITIVE (A) NONE DETECTED   Amphetamines NONE DETECTED NONE DETECTED   Tetrahydrocannabinol NONE DETECTED NONE DETECTED   Barbiturates NONE DETECTED NONE DETECTED  Lipase, blood  Result Value Ref Range   Lipase 45 11 - 51 U/L  Troponin I (High Sensitivity)  Result Value Ref Range   Troponin I (High Sensitivity) 5 <18 ng/L  Troponin I (High Sensitivity)  Result Value Ref Range   Troponin I (High Sensitivity) 5 <18 ng/L   CT HEAD WO CONTRAST  Result Date: 10/02/2022 CLINICAL DATA:  Headache EXAM: CT HEAD WITHOUT CONTRAST TECHNIQUE: Contiguous axial images were obtained from the base  of the skull through the vertex without intravenous contrast. RADIATION DOSE REDUCTION: This exam was performed according to the departmental dose-optimization program which includes automated exposure control, adjustment of the mA and/or kV according to patient size and/or use of iterative reconstruction technique. COMPARISON:  CT Head 02/26/19 FINDINGS: Brain: No hemorrhage. No CT evidence of an acute infarct. No hydrocephalus. No extra-axial fluid collection. No midline shift. Vascular: No hyperdense vessel or unexpected calcification. Skull: Normal. Negative for fracture or focal lesion.  Sinuses/Orbits: No mastoid or middle ear effusion. Foramina cerumen in the left EAC. Paranasal sinuses are clear. Orbits are unremarkable. Other: None. IMPRESSION: No specific etiology for headaches or altered mental status identified. Electronically Signed   By: Marin Roberts M.D.   On: 10/02/2022 11:02   DG Chest Port 1 View  Result Date: 10/02/2022 CLINICAL DATA:  Patient complains of pain in lungs after cleaning croup came to his house. EXAM: PORTABLE CHEST 1 VIEW COMPARISON:  09/10/2022 FINDINGS: Stable cardiomediastinal contours. Heart size is normal. No pleural effusion or edema. No airspace opacities identified. Thoracic scoliosis with convexity towards the right. No acute osseous abnormality. Stable ACDF within the lower cervical spine IMPRESSION: 1. No acute cardiopulmonary abnormalities. 2. Thoracic scoliosis. Electronically Signed   By: Kerby Moors M.D.   On: 10/02/2022 10:11   US Abdomen Limited  Result Date: 09/30/2022 CLINICAL DATA:  Right upper quadrant pain for 2 days. EXAM: ULTRASOUND ABDOMEN LIMITED RIGHT UPPER QUADRANT COMPARISON:  09/26/2022. FINDINGS: Gallbladder: No stones are seen within the gallbladder. There is gallbladder wall thickening measuring 5 mm. No pericholecystic fluid. No sonographic Murphy sign noted by sonographer. Common bile duct: Diameter: 5 mm Liver: A small septated cyst is noted in the right lobe of the liver measuring 0.9 x 0.9 x 1.0 cm. Increased parenchymal echogenicity. Portal vein is patent on color Doppler imaging with normal direction of blood flow towards the liver. Other: No free fluid. IMPRESSION: 1. Gallbladder wall thickening with no evidence of cholelithiasis and no Murphy sign. Correlate clinically to exclude chronic cholecystitis versus local inflammatory changes. 2. Hepatic steatosis and cyst. Electronically Signed   By: Brett Fairy M.D.   On: 09/30/2022 02:39   CT ABDOMEN PELVIS W CONTRAST  Result Date: 09/26/2022 CLINICAL DATA:  Abdominal  pain EXAM: CT ABDOMEN AND PELVIS WITH CONTRAST TECHNIQUE: Multidetector CT imaging of the abdomen and pelvis was performed using the standard protocol following bolus administration of intravenous contrast. RADIATION DOSE REDUCTION: This exam was performed according to the departmental dose-optimization program which includes automated exposure control, adjustment of the mA and/or kV according to patient size and/or use of iterative reconstruction technique. CONTRAST:  125mL OMNIPAQUE IOHEXOL 300 MG/ML  SOLN COMPARISON:  Multiple recent priors, including 09/10/2022, 08/30/2022, and 08/18/2022 FINDINGS: Lower chest: Lung bases are clear. Hepatobiliary: Scattered small hepatic cysts, measuring up to 10 mm in segment 4B (series 3/image 20), benign. No follow-up is recommended. Gallbladder is unremarkable. No intrahepatic or extrahepatic ductal dilatation. Pancreas: Within normal limits. Spleen: Within normal limits. Adrenals/Urinary Tract: Adrenal glands are within normal limits. Kidneys are within normal limits.  No hydronephrosis. Bladder is within normal limits. Stomach/Bowel: Stomach is within normal limits. No evidence of bowel obstruction. Appendix is not discretely visualized. Moderate colonic stool burden, suggesting mild constipation. No colonic wall thickening or inflammatory changes. Vascular/Lymphatic: No evidence of abdominal aortic aneurysm. Atherosclerotic calcifications of the abdominal aorta and branch vessels. No suspicious abdominopelvic lymphadenopathy. Reproductive: Prostate is unremarkable. Other: No abdominopelvic ascites. Postprocedural changes in the  left inguinal region. Musculoskeletal: Mild degenerative changes of the mid lumbar spine. IMPRESSION: No CT findings to account for the patient's abdominal pain. No change from multiple recent priors. Electronically Signed   By: Julian Hy M.D.   On: 09/26/2022 19:47   DG Chest 2 View  Result Date: 09/10/2022 CLINICAL DATA:  Chest pain  EXAM: CHEST - 2 VIEW COMPARISON:  Chest x-ray 09/09/2022. FINDINGS: Unchanged cardiomediastinal silhouette. Both lungs are clear. No visible pleural effusions or pneumothorax. No acute osseous abnormality. Partially imaged ACDF. Broad dextrocurvature of the thoracic spine. IMPRESSION: No active cardiopulmonary disease. Electronically Signed   By: Margaretha Sheffield M.D.   On: 09/10/2022 17:07   CT ABDOMEN PELVIS W CONTRAST  Result Date: 09/10/2022 CLINICAL DATA:  Left lower quadrant abdominal pain. Left inguinal hernia repair on 08/20/2022. EXAM: CT ABDOMEN AND PELVIS WITH CONTRAST TECHNIQUE: Multidetector CT imaging of the abdomen and pelvis was performed using the standard protocol following bolus administration of intravenous contrast. RADIATION DOSE REDUCTION: This exam was performed according to the departmental dose-optimization program which includes automated exposure control, adjustment of the mA and/or kV according to patient size and/or use of iterative reconstruction technique. CONTRAST:  135mL OMNIPAQUE IOHEXOL 350 MG/ML SOLN COMPARISON:  08/30/2022 FINDINGS: Lower chest: No acute abnormality. Hepatobiliary: No focal liver abnormality is seen. No gallstones, gallbladder wall thickening, or biliary dilatation. Pancreas: Unremarkable. No pancreatic ductal dilatation or surrounding inflammatory changes. Spleen: Normal in size without focal abnormality. Adrenals/Urinary Tract: Adrenal glands are unremarkable. Kidneys are normal, without renal calculi, focal lesion, or hydronephrosis. Bladder is unremarkable. Stomach/Bowel: Bowel shows no evidence of obstruction, ileus, inflammation or lesion. There is moderate stool throughout the colon. The appendix is not visualized. No free intraperitoneal air. Vascular/Lymphatic: Stable atherosclerosis of the abdominal aorta without aneurysm. No enlarged lymph nodes identified in the abdomen or pelvis. Reproductive: Prostate is unremarkable. Other: Decrease  postoperative stranding in the left inguinal canal without evidence of recurrent hernia or abnormal fluid collection. Musculoskeletal: Stable degenerative disc disease of the lumbar spine with associated leftward convex scoliosis. IMPRESSION: 1. No acute findings in the abdomen or pelvis. 2. Decrease in postoperative stranding in the left inguinal canal without evidence of recurrent hernia or abnormal fluid collection. 3. Stable atherosclerosis of the abdominal aorta without aneurysm. 4. Moderate stool throughout the colon. Aortic Atherosclerosis (ICD10-I70.0). Electronically Signed   By: Aletta Edouard M.D.   On: 09/10/2022 16:56    EKG None  Radiology CT HEAD WO CONTRAST  Result Date: 10/02/2022 CLINICAL DATA:  Headache EXAM: CT HEAD WITHOUT CONTRAST TECHNIQUE: Contiguous axial images were obtained from the base of the skull through the vertex without intravenous contrast. RADIATION DOSE REDUCTION: This exam was performed according to the departmental dose-optimization program which includes automated exposure control, adjustment of the mA and/or kV according to patient size and/or use of iterative reconstruction technique. COMPARISON:  CT Head 02/26/19 FINDINGS: Brain: No hemorrhage. No CT evidence of an acute infarct. No hydrocephalus. No extra-axial fluid collection. No midline shift. Vascular: No hyperdense vessel or unexpected calcification. Skull: Normal. Negative for fracture or focal lesion. Sinuses/Orbits: No mastoid or middle ear effusion. Foramina cerumen in the left EAC. Paranasal sinuses are clear. Orbits are unremarkable. Other: None. IMPRESSION: No specific etiology for headaches or altered mental status identified. Electronically Signed   By: Marin Roberts M.D.   On: 10/02/2022 11:02   DG Chest Port 1 View  Result Date: 10/02/2022 CLINICAL DATA:  Patient complains of pain in lungs after cleaning croup  came to his house. EXAM: PORTABLE CHEST 1 VIEW COMPARISON:  09/10/2022 FINDINGS: Stable  cardiomediastinal contours. Heart size is normal. No pleural effusion or edema. No airspace opacities identified. Thoracic scoliosis with convexity towards the right. No acute osseous abnormality. Stable ACDF within the lower cervical spine IMPRESSION: 1. No acute cardiopulmonary abnormalities. 2. Thoracic scoliosis. Electronically Signed   By: Kerby Moors M.D.   On: 10/02/2022 10:11    Procedures Procedures  {Document cardiac monitor, telemetry assessment procedure when appropriate:1}  Medications Ordered in ED Medications  diazepam (VALIUM) tablet 5 mg (5 mg Oral Given 10/03/22 2152)  acetaminophen (TYLENOL) tablet 1,000 mg (1,000 mg Oral Given 10/03/22 2152)    ED Course/ Medical Decision Making/ A&P   {   Click here for ABCD2, HEART and other calculatorsREFRESH Note before signing :1}                          Medical Decision Making Amount and/or Complexity of Data Reviewed Labs: ordered.  Risk OTC drugs. Prescription drug management.   Reviewed nursing notes and prior charts for additional history.  Pt with multiple CT imaging studies in the past few months negative for acute process.   Pt appears quite anxious, reassurance provided. Valium po.   Pt with low grade fever in ED.  Pt challenging historian.  Will check labs, give fluids, and plan to reassess post meds, fluids and when labs resulted.   Signed to to Dr Florina Ou to check labs, recheck pt, and dispo appropriately.     {Document critical care time when appropriate:1} {Document review of labs and clinical decision tools ie heart score, Chads2Vasc2 etc:1}  {Document your independent review of radiology images, and any outside records:1} {Document your discussion with family members, caretakers, and with consultants:1} {Document social determinants of health affecting pt's care:1} {Document your decision making why or why not admission, treatments were needed:1} Final Clinical Impression(s) / ED Diagnoses Final  diagnoses:  Anxiety  Other constipation  Low grade fever    Rx / DC Orders ED Discharge Orders     None

## 2022-10-04 LAB — RESP PANEL BY RT-PCR (RSV, FLU A&B, COVID)  RVPGX2
Influenza A by PCR: NEGATIVE
Influenza B by PCR: NEGATIVE
Resp Syncytial Virus by PCR: NEGATIVE
SARS Coronavirus 2 by RT PCR: NEGATIVE

## 2022-10-04 NOTE — ED Provider Notes (Signed)
Nursing notes and vitals signs, including pulse oximetry, reviewed.  Summary of this visit's results, reviewed by myself:  EKG:  EKG Interpretation  Date/Time:    Ventricular Rate:    PR Interval:    QRS Duration:   QT Interval:    QTC Calculation:   R Axis:     Text Interpretation:          Labs:  Results for orders placed or performed during the hospital encounter of 10/03/22 (from the past 24 hour(s))  CBC     Status: Abnormal   Collection Time: 10/03/22 11:11 PM  Result Value Ref Range   WBC 6.6 4.0 - 10.5 K/uL   RBC 3.91 (L) 4.22 - 5.81 MIL/uL   Hemoglobin 12.4 (L) 13.0 - 17.0 g/dL   HCT 35.3 (L) 39.0 - 52.0 %   MCV 90.3 80.0 - 100.0 fL   MCH 31.7 26.0 - 34.0 pg   MCHC 35.1 30.0 - 36.0 g/dL   RDW 13.3 11.5 - 15.5 %   Platelets 191 150 - 400 K/uL   nRBC 0.0 0.0 - 0.2 %  Comprehensive metabolic panel     Status: Abnormal   Collection Time: 10/03/22 11:11 PM  Result Value Ref Range   Sodium 134 (L) 135 - 145 mmol/L   Potassium 3.9 3.5 - 5.1 mmol/L   Chloride 102 98 - 111 mmol/L   CO2 22 22 - 32 mmol/L   Glucose, Bld 96 70 - 99 mg/dL   BUN 18 8 - 23 mg/dL   Creatinine, Ser 0.90 0.61 - 1.24 mg/dL   Calcium 9.1 8.9 - 10.3 mg/dL   Total Protein 6.4 (L) 6.5 - 8.1 g/dL   Albumin 3.9 3.5 - 5.0 g/dL   AST 22 15 - 41 U/L   ALT 19 0 - 44 U/L   Alkaline Phosphatase 52 38 - 126 U/L   Total Bilirubin 0.8 0.3 - 1.2 mg/dL   GFR, Estimated >60 >60 mL/min   Anion gap 10 5 - 15  Resp panel by RT-PCR (RSV, Flu A&B, Covid) Anterior Nasal Swab     Status: None   Collection Time: 10/03/22 11:24 PM   Specimen: Anterior Nasal Swab  Result Value Ref Range   SARS Coronavirus 2 by RT PCR NEGATIVE NEGATIVE   Influenza A by PCR NEGATIVE NEGATIVE   Influenza B by PCR NEGATIVE NEGATIVE   Resp Syncytial Virus by PCR NEGATIVE NEGATIVE    Imaging Studies: CT HEAD WO CONTRAST  Result Date: 10/02/2022 CLINICAL DATA:  Headache EXAM: CT HEAD WITHOUT CONTRAST TECHNIQUE: Contiguous axial  images were obtained from the base of the skull through the vertex without intravenous contrast. RADIATION DOSE REDUCTION: This exam was performed according to the departmental dose-optimization program which includes automated exposure control, adjustment of the mA and/or kV according to patient size and/or use of iterative reconstruction technique. COMPARISON:  CT Head 02/26/19 FINDINGS: Brain: No hemorrhage. No CT evidence of an acute infarct. No hydrocephalus. No extra-axial fluid collection. No midline shift. Vascular: No hyperdense vessel or unexpected calcification. Skull: Normal. Negative for fracture or focal lesion. Sinuses/Orbits: No mastoid or middle ear effusion. Foramina cerumen in the left EAC. Paranasal sinuses are clear. Orbits are unremarkable. Other: None. IMPRESSION: No specific etiology for headaches or altered mental status identified. Electronically Signed   By: Marin Roberts M.D.   On: 10/02/2022 11:02   DG Chest Port 1 View  Result Date: 10/02/2022 CLINICAL DATA:  Patient complains of pain in lungs after  cleaning croup came to his house. EXAM: PORTABLE CHEST 1 VIEW COMPARISON:  09/10/2022 FINDINGS: Stable cardiomediastinal contours. Heart size is normal. No pleural effusion or edema. No airspace opacities identified. Thoracic scoliosis with convexity towards the right. No acute osseous abnormality. Stable ACDF within the lower cervical spine IMPRESSION: 1. No acute cardiopulmonary abnormalities. 2. Thoracic scoliosis. Electronically Signed   By: Kerby Moors M.D.   On: 10/02/2022 10:11    Diagnostic studies are reassuring.  Patient has not been able to provide a urine specimen.  I do not believe this is important for his care is all of the documented urinalyses over the past several months have been unremarkable, most recently 09/29/2022.     Kennadi Albany, Jenny Reichmann, MD 10/04/22 [phone]

## 2022-10-06 ENCOUNTER — Encounter (HOSPITAL_COMMUNITY): Payer: Self-pay | Admitting: Emergency Medicine

## 2022-10-06 ENCOUNTER — Emergency Department (HOSPITAL_COMMUNITY)
Admission: EM | Admit: 2022-10-06 | Discharge: 2022-10-06 | Disposition: A | Discharge status: Home / Self Care | Payer: Medicare Other | Attending: Emergency Medicine | Admitting: Emergency Medicine

## 2022-10-06 DIAGNOSIS — R109 Unspecified abdominal pain: Secondary | ICD-10-CM | POA: Diagnosis present

## 2022-10-06 MED ORDER — ACETAMINOPHEN 500 MG PO TABS
1000.0000 mg | ORAL_TABLET | Freq: Once | ORAL | Status: DC
  Filled 2022-10-06: qty 2

## 2022-10-06 NOTE — ED Triage Notes (Signed)
Pt BIB EMS from home, c/o abdominal pain right lower quad. Per EMS, after eating cookie, c/o nausea without vomit.   BP 172/92 P 100 RR 24  spO2 100%

## 2022-10-06 NOTE — ED Provider Notes (Signed)
[facility] Provider Note   CSN: [id number] Arrival date & time: 10/06/22  1806     History Chief Complaint  Patient presents with   Abdominal Pain    HPI JONERIK SLIKER is a 67 y.o. male presenting for multiple chief complaints.  He is endorsing abdominal pain, nausea, chronic weakness, fatigue, malaise.  He has been evaluated for all these conditions over the past few months.  Currently he is ambulatory tolerating p.o. intake.  States his pain is chronic in nature.  He has not follow-up with her primary care doctor nor his general surgeon as recommended to multiple times.  He is endorsing continued constipation but is not taking MiraLAX nor any of the other recommendations.  Patient is distracted changing subjects frequently requiring reorientation frequently.  Patient's recorded medical, surgical, social, medication list and allergies were reviewed in the Snapshot window as part of the initial history.   Review of Systems   Review of Systems  Constitutional:  Negative for chills and fever.  HENT:  Negative for ear pain and sore throat.   Eyes:  Negative for pain and visual disturbance.  Respiratory:  Negative for cough and shortness of breath.   Cardiovascular:  Negative for chest pain and palpitations.  Gastrointestinal:  Positive for abdominal pain. Negative for vomiting.  Genitourinary:  Negative for dysuria and hematuria.  Musculoskeletal:  Negative for arthralgias and back pain.  Skin:  Negative for color change and rash.  Neurological:  Negative for seizures and syncope.  All other systems reviewed and are negative.   Physical Exam Updated Vital Signs BP 124/82   Pulse 82   Temp 98.1 F (36.7 C) (Oral)   Resp 18   SpO2 98%  Physical Exam Vitals and nursing note reviewed.  Constitutional:      General: He is not in acute distress.    Appearance: He is well-developed.  HENT:     Head: Normocephalic and atraumatic.   Eyes:     Conjunctiva/sclera: Conjunctivae normal.  Cardiovascular:     Rate and Rhythm: Normal rate and regular rhythm.     Heart sounds: No murmur heard. Pulmonary:     Effort: Pulmonary effort is normal. No respiratory distress.     Breath sounds: Normal breath sounds.  Abdominal:     Palpations: Abdomen is soft.     Tenderness: There is no abdominal tenderness.  Musculoskeletal:        General: No swelling.     Cervical back: Neck supple.  Skin:    General: Skin is warm and dry.     Capillary Refill: Capillary refill takes less than 2 seconds.  Neurological:     Mental Status: He is alert.  Psychiatric:        Mood and Affect: Mood normal.      ED Course/ Medical Decision Making/ A&P    Procedures Procedures   Medications Ordered in ED Medications  acetaminophen (TYLENOL) tablet 1,000 mg (has no administration in time range)   Medical Decision Making:   BRALEN WILTGEN is a 67 y.o. male who presented to the ED today with abdominal pain, detailed above.    Patient's presentation is complicated by their history of multiple comorbid medical problems.  Patient placed on continuous vitals and telemetry monitoring while in ED which was reviewed periodically.  Complete initial physical exam performed, notably the patient  was hemodynamically stable in no acute distress..     Reviewed and confirmed  nursing documentation for past medical history, family history, social history.    Initial Assessment:   With the patient's presentation of abdominal pain, most likely diagnosis is nonspecific etiology. Other diagnoses were considered including (but not limited to) gastroenteritis, colitis, small bowel obstruction, appendicitis, cholecystitis, pancreatitis, nephrolithiasis, UTI, pyleonephritis, ruptured ectopic pregnancy, PID, testicular torsion. These are considered less likely due to history of present illness and physical exam findings.   This is most consistent with an acute  life/limb threatening illness complicated by underlying chronic conditions.   Initial Plan:  CBC/CMP to evaluate for underlying infectious/metabolic etiology for patient's abdominal pain  Lipase to evaluate for pancreatitis  Repeat ultrasound Urinalysis and repeat physical assessment to evaluate for UTI/Pyelonpehritis  Empiric management of symptoms with escalating pain control and antiemetics as needed.   Final Reassessment and Plan:   I recommended the above therapies, IV fluids and reassessment.  I was informed that patient does not want to undergo blood work again because he does not want another IV.  He has declined my evaluation today.  I have informed him that this is going to increase his risk for missed severe diagnosis such as infection or potential organ failure and death.  He expressed understanding of limitation of evaluation today.  He is asking for pain medication.  With his prior diagnoses of severe constipation, I am worried that further pain medications may predispose patient to bowel obstruction and worsening constipation and they would be relatively contraindicated to him. He should continue with Tylenol, follow-up with GI, follow-up with surgeon and continue outpatient care and management.  Disposition:  I have considered need for hospitalization, however, considering all of the above, I believe this patient is stable for discharge at this time.  Patient/family educated about specific return precautions for given chief complaint and symptoms.  Patient/family educated about follow-up with PCP/GS/GI.     Patient/family expressed understanding of return precautions and need for follow-up. Patient spoken to regarding all imaging and laboratory results and appropriate follow up for these results. All education provided in verbal form with additional information in written form. Time was allowed for answering of patient questions. Patient discharged.    Emergency Department  Medication Summary:   Medications  acetaminophen (TYLENOL) tablet 1,000 mg (has no administration in time range)         Clinical Impression:  1. Abdominal pain, unspecified abdominal location      Discharge   Final Clinical Impression(s) / ED Diagnoses Final diagnoses:  Abdominal pain, unspecified abdominal location    Rx / DC Orders ED Discharge Orders     None         Tretha Sciara, MD 10/06/22 1906

## 2022-10-11 ENCOUNTER — Encounter (HOSPITAL_COMMUNITY): Payer: Self-pay

## 2022-10-11 ENCOUNTER — Emergency Department (HOSPITAL_COMMUNITY): Payer: Medicare Other

## 2022-10-11 ENCOUNTER — Other Ambulatory Visit: Payer: Self-pay

## 2022-10-11 ENCOUNTER — Emergency Department (HOSPITAL_COMMUNITY)
Admission: EM | Admit: 2022-10-11 | Discharge: 2022-10-11 | Disposition: A | Discharge status: Home / Self Care | Payer: Medicare Other | Attending: Emergency Medicine | Admitting: Emergency Medicine

## 2022-10-11 DIAGNOSIS — E871 Hypo-osmolality and hyponatremia: Secondary | ICD-10-CM | POA: Insufficient documentation

## 2022-10-11 DIAGNOSIS — R1030 Lower abdominal pain, unspecified: Secondary | ICD-10-CM

## 2022-10-11 DIAGNOSIS — K5900 Constipation, unspecified: Secondary | ICD-10-CM | POA: Diagnosis not present

## 2022-10-11 DIAGNOSIS — R202 Paresthesia of skin: Secondary | ICD-10-CM | POA: Insufficient documentation

## 2022-10-11 DIAGNOSIS — R1031 Right lower quadrant pain: Secondary | ICD-10-CM | POA: Diagnosis present

## 2022-10-11 DIAGNOSIS — Z79899 Other long term (current) drug therapy: Secondary | ICD-10-CM | POA: Insufficient documentation

## 2022-10-11 DIAGNOSIS — R748 Abnormal levels of other serum enzymes: Secondary | ICD-10-CM | POA: Diagnosis not present

## 2022-10-11 DIAGNOSIS — I10 Essential (primary) hypertension: Secondary | ICD-10-CM | POA: Diagnosis not present

## 2022-10-11 LAB — LIPASE, BLOOD: Lipase: 52 U/L — ABNORMAL HIGH (ref 11–51)

## 2022-10-11 LAB — COMPREHENSIVE METABOLIC PANEL
ALT: 29 U/L (ref 0–44)
AST: 30 U/L (ref 15–41)
Albumin: 4.3 g/dL (ref 3.5–5.0)
Alkaline Phosphatase: 63 U/L (ref 38–126)
Anion gap: 9 (ref 5–15)
BUN: 13 mg/dL (ref 8–23)
CO2: 22 mmol/L (ref 22–32)
Calcium: 9.2 mg/dL (ref 8.9–10.3)
Chloride: 99 mmol/L (ref 98–111)
Creatinine, Ser: 0.79 mg/dL (ref 0.61–1.24)
GFR, Estimated: >60 mL/min (ref >60)
Glucose, Bld: 98 mg/dL (ref 70–99)
Potassium: 3.8 mmol/L (ref 3.5–5.1)
Sodium: 130 mmol/L — ABNORMAL LOW (ref 135–145)
Total Bilirubin: 0.4 mg/dL (ref 0.3–1.2)
Total Protein: 6.6 g/dL (ref 6.5–8.1)

## 2022-10-11 LAB — CBC WITH DIFFERENTIAL/PLATELET
Abs Immature Granulocytes: 0.01 10*3/uL (ref 0.00–0.07)
Basophils Absolute: 0 10*3/uL (ref 0.0–0.1)
Basophils Relative: 1 %
Eosinophils Absolute: 0 10*3/uL (ref 0.0–0.5)
Eosinophils Relative: 0 %
HCT: 39.4 % (ref 39.0–52.0)
Hemoglobin: 14.1 g/dL (ref 13.0–17.0)
Immature Granulocytes: 0 %
Lymphocytes Relative: 25 %
Lymphs Abs: 1.6 10*3/uL (ref 0.7–4.0)
MCH: 32 pg (ref 26.0–34.0)
MCHC: 35.8 g/dL (ref 30.0–36.0)
MCV: 89.3 fL (ref 80.0–100.0)
Monocytes Absolute: 0.5 10*3/uL (ref 0.1–1.0)
Monocytes Relative: 7 %
Neutro Abs: 4.3 10*3/uL (ref 1.7–7.7)
Neutrophils Relative %: 67 %
Platelets: 198 10*3/uL (ref 150–400)
RBC: 4.41 MIL/uL (ref 4.22–5.81)
RDW: 13.1 % (ref 11.5–15.5)
WBC: 6.4 10*3/uL (ref 4.0–10.5)
nRBC: 0 % (ref 0.0–0.2)

## 2022-10-11 MED ORDER — KETOROLAC TROMETHAMINE 30 MG/ML IJ SOLN
30.0000 mg | Freq: Once | INTRAMUSCULAR | Status: AC
  Administered 2022-10-11: 30 mg via INTRAVENOUS
  Filled 2022-10-11: qty 1

## 2022-10-11 MED ORDER — IOHEXOL 350 MG/ML SOLN
75.0000 mL | Freq: Once | INTRAVENOUS | Status: AC | PRN
  Administered 2022-10-11: 75 mL via INTRAVENOUS

## 2022-10-11 NOTE — ED Triage Notes (Addendum)
Pt bib ems from home; pt states he was eating and feels like he aspirated;  c/o L side pain and numbness; LSN 1500; stroke screen negative with ems; pt still endorsing numbness and pain L side; also c/o LLQ abd pain, hx hernia repair; pt also c/o constipation, last BM couple days ago; pt endorsing sob and cp that started after eating; EKG unremarkable;170/100, hr 116, RR 22, 100% RA, cbg 105; equal strength in all extremities, no other neuro deficits

## 2022-10-11 NOTE — ED Provider Triage Note (Signed)
Emergency Medicine Provider Triage Evaluation Note  Brad [name] , a 67 y.o. male  was evaluated in triage.  Pt complains of pain. He had Surgery on his colon in December and has had consistent pain.  Has not had a bowel movement in 2 days typically uses milk of magnesia and magnesium citrate for his symptoms.  Denies cough.  Also complaining of left sided numbness and tingling that began shortly after his episode of aspiration.  Review of Systems  Positive: As above Negative: As above  Physical Exam  BP (!) 151/95 (BP Location: Right Arm)   Pulse 89   Temp 98.6 F (37 C) (Oral)   Resp (!) 22   Ht 5' 9"$  (1.753 m)   Wt 59 kg   SpO2 99%   BMI 19.20 kg/m  Gen:   Awake, no distress, very anxious Resp:  Normal effort  MSK:   Moves extremities without difficulty  Other:    Medical Decision Making  Medically screening exam initiated at 4:38 PM.  Appropriate orders placed.  Brad [name] was informed that the remainder of the evaluation will be completed by another provider, this initial triage assessment does not replace that evaluation, and the importance of remaining in the ED until their evaluation is complete.  Abdominal pain labs, chest x-ray   Roylene Reason, Hershal Coria 10/11/22 1641

## 2022-10-11 NOTE — ED Notes (Signed)
Pt stating that he can't possibly go home since he "can't walk".  RN has explained that the only thing that is wrong is the constipation.

## 2022-10-11 NOTE — ED Provider Notes (Signed)
[facility] Provider Note   CSN: [id number] Arrival date & time: 10/11/22  1619     History  No chief complaint on file.   Brad [name] is a 67 y.o. male.  With history of left inguinal hernia repair in December 2012, major depressive disorder, anxiety, insomnia, depression, hypertension who presents to the ED via EMS for evaluation of multiple complaints.  He states he has had bilateral lower quadrant abdominal pain for the past 2 days.  He has been unable to have a bowel movement during that time, however he then stated he tried milk of magnesia and magnesium citrate and has had some watery bowel movements.  He is still passing gas.  He states that he was eating [place] and noodles approximately 1 hour prior to arrival when he believes he aspirated some of this.  He has had some difficulty catching his breath since that time.  He is not coughing.  He denies chest pain.  He denies nausea or vomiting.  He states that shortly after his episode of possible aspiration he developed paresthesias of the left upper and left lower extremities.  He states that this was the main reason for him to call EMS.  He denies fevers, chills, urinary symptoms.  HPI     Home Medications Prior to Admission medications   Medication Sig Start Date End Date Taking? Authorizing Provider  Alpha-Lipoic Acid 600 MG CAPS Take 1 capsule by mouth daily.    [provider]  amLODipine (NORVASC) 5 MG tablet Take 2 tablets (10 mg total) by mouth daily. Patient taking differently: Take 5 mg by mouth daily. 03/02/19   Johnn Hai, MD  atorvastatin (LIPITOR) 10 MG tablet Take 10 mg by mouth 3 (three) times a week. MWF 08/10/19   [provider]  busPIRone (BUSPAR) 10 MG tablet Take 20 mg by mouth 3 (three) times daily.     [provider]  Cholecalciferol (VITAMIN D3) 125 MCG (5000 UT) TABS Take 1 tablet by mouth daily.    [provider]  Coenzyme  Q10 50 MG CAPS Take 1 capsule by mouth daily.    [provider]  dicyclomine (BENTYL) 20 MG tablet Take 1 tablet (20 mg total) by mouth 2 (two) times daily as needed for spasms. 09/30/22   Marcello Fennel, PA-C  docusate sodium (COLACE) 100 MG capsule Take 1 capsule (100 mg total) by mouth every 12 (twelve) hours. 09/26/22   Valarie Merino, MD  LORazepam (ATIVAN) 1 MG tablet Take 1 mg by mouth 2 (two) times daily. 10/26/18   [provider]  losartan (COZAAR) 100 MG tablet Take 100 mg by mouth daily. 02/04/20   [provider]  memantine (NAMENDA) 10 MG tablet Take 1 tablet (10 mg total) by mouth 2 (two) times daily. Patient not taking: Reported on 08/20/2022 03/02/19   Johnn Hai, MD  Methylfol-Methylcob-Acetylcyst (CEREFOLIN NAC) 6-2-600 MG TABS 1 a day Patient not taking: Reported on 08/20/2022 03/02/19   Johnn Hai, MD  mirtazapine (REMERON) 30 MG tablet Take 30 mg by mouth at bedtime.    [provider]  Multiple Vitamin (MULTIVITAMIN) tablet Take 1 tablet by mouth daily.    [provider]  Omega-3 1000 MG CAPS Take 1 capsule by mouth daily.    [provider]  oxyCODONE (OXY IR/ROXICODONE) 5 MG immediate release tablet Take 1 tablet (5 mg total) by mouth every 6 (six) hours as needed for  severe pain or breakthrough pain. Patient not taking: Reported on 09/30/2022 08/23/22   Michael Boston, MD  polyethylene glycol (MIRALAX) 17 g packet Take 17 g by mouth daily. 08/26/22   Rancour, Annie Main, MD  pregabalin (LYRICA) 75 MG capsule Take 75 mg by mouth 2 (two) times daily.    [provider]  Prenatal Vit-Fe Fumarate-FA (PRENATAL MULTIVITAMIN) TABS tablet Take 1 tablet by mouth daily. Patient not taking: Reported on 08/20/2022 03/03/19   Johnn Hai, MD  traZODone (DESYREL) 50 MG tablet Take 50 mg by mouth at bedtime.    [provider]  zaleplon (SONATA) 10 MG capsule Take 10 mg by mouth at bedtime as needed for sleep.     [provider]  zinc gluconate 50 MG tablet Take 50 mg by mouth daily.    [provider]  zolpidem (AMBIEN CR) 12.5 MG CR tablet Take 12.5 mg by mouth at bedtime.     [provider]      Allergies    Duloxetine hcl, Gabapentin, and Trileptal [oxcarbazepine]    Review of Systems   Review of Systems  Respiratory:  Positive for shortness of breath.   Gastrointestinal:  Positive for abdominal pain.  All other systems reviewed and are negative.   Physical Exam Updated Vital Signs BP (!) 151/97   Pulse 79   Temp 99 F (37.2 C) (Oral)   Resp (!) 22   Ht 5' 9"$  (1.753 m)   Wt 59 kg   SpO2 100%   BMI 19.20 kg/m  Physical Exam Vitals and nursing note reviewed.  Constitutional:      General: He is not in acute distress.    Appearance: He is well-developed.  HENT:     Head: Normocephalic and atraumatic.     Mouth/Throat:     Mouth: Mucous membranes are moist.     Pharynx: Oropharynx is clear.     Comments: No abnormalities of the posterior oropharynx Eyes:     Conjunctiva/sclera: Conjunctivae normal.  Cardiovascular:     Rate and Rhythm: Normal rate and regular rhythm.     Heart sounds: No murmur heard. Pulmonary:     Effort: Pulmonary effort is normal. No respiratory distress.     Breath sounds: Normal breath sounds.  Abdominal:     Palpations: Abdomen is soft.     Tenderness: There is abdominal tenderness (Bilateral lower quadrants). There is no guarding.     Comments: Fully healed surgical scar in the left lower quadrant with no surrounding erythema.  No drainage  Musculoskeletal:        General: No swelling.     Cervical back: Neck supple.  Skin:    General: Skin is warm and dry.     Capillary Refill: Capillary refill takes less than 2 seconds.  Neurological:     Mental Status: He is alert.     Comments: No facial asymmetry, slurred speech, unilateral or global weakness, pronator drift.  Normal finger-to-nose, heel-to-shin and shoulder  shrug.  Sensation intact in all extremities.  Psychiatric:     Comments: Very anxious     ED Results / Procedures / Treatments   Labs (all labs ordered are listed, but only abnormal results are displayed) Labs Reviewed  COMPREHENSIVE METABOLIC PANEL - Abnormal; Notable for the following components:      Result Value   Sodium 130 (*)    All other components within normal limits  LIPASE, BLOOD - Abnormal; Notable for the following components:  Lipase 52 (*)    All other components within normal limits  CBC WITH DIFFERENTIAL/PLATELET  URINALYSIS, ROUTINE W REFLEX MICROSCOPIC    EKG None  Radiology CT Abdomen Pelvis W Contrast  Result Date: 10/11/2022 CLINICAL DATA:  Bowel obstruction suspected. Left-sided abdominal pain. EXAM: CT ABDOMEN AND PELVIS WITH CONTRAST TECHNIQUE: Multidetector CT imaging of the abdomen and pelvis was performed using the standard protocol following bolus administration of intravenous contrast. RADIATION DOSE REDUCTION: This exam was performed according to the departmental dose-optimization program which includes automated exposure control, adjustment of the mA and/or kV according to patient size and/or use of iterative reconstruction technique. CONTRAST:  73m OMNIPAQUE IOHEXOL 350 MG/ML SOLN COMPARISON:  CT abdomen and pelvis 09/26/2022 FINDINGS: Lower chest: No acute abnormality. Hepatobiliary: Subcentimeter cysts in the liver appear unchanged. Gallbladder and bile ducts are within normal limits. Pancreas: Unremarkable. No pancreatic ductal dilatation or surrounding inflammatory changes. Spleen: Normal in size without focal abnormality. Adrenals/Urinary Tract: Adrenal glands are unremarkable. Kidneys are normal, without renal calculi, focal lesion, or hydronephrosis. Bladder is unremarkable. Stomach/Bowel: Stomach is within normal limits. Appendix is not seen. No evidence of bowel wall thickening, distention, or inflammatory changes. There is gaseous distention of  small bowel with some scattered air-fluid levels. Vascular/Lymphatic: Aortic atherosclerosis. No enlarged abdominal or pelvic lymph nodes. Reproductive: Prostate is unremarkable. Other: No abdominal wall hernia or abnormality. No abdominopelvic ascites. Musculoskeletal: Degenerative changes affect the spine. IMPRESSION: 1. Gaseous distention of small bowel with scattered air-fluid levels. Findings can be seen in the setting of ileus or enteritis. No bowel obstruction. Aortic Atherosclerosis (ICD10-I70.0). Electronically Signed   By: ARonney AstersM.D.   On: 10/11/2022 19:25   DG Chest 2 View  Result Date: 10/11/2022 CLINICAL DATA:  Aspiration EXAM: CHEST - 2 VIEW COMPARISON:  Chest x-ray 10/02/2022 FINDINGS: The heart size and mediastinal contours are within normal limits. Both lungs are clear. There is dextroconvex scoliosis of the thoracic spine. Cervical spinal fusion plate is present. No acute fractures are seen. IMPRESSION: No active cardiopulmonary disease. Electronically Signed   By: ARonney AstersM.D.   On: 10/11/2022 17:12    Procedures Procedures    Medications Ordered in ED Medications  ketorolac (TORADOL) 30 MG/ML injection 30 mg (30 mg Intravenous Given 10/11/22 1835)  iohexol (OMNIPAQUE) 350 MG/ML injection 75 mL (75 mLs Intravenous Contrast Given 10/11/22 1907)    ED Course/ Medical Decision Making/ A&P                             Medical Decision Making Amount and/or Complexity of Data Reviewed Labs: ordered. Radiology: ordered.  This patient presents to the ED for concern of abdominal pain, this involves an extensive number of treatment options, and is a complaint that carries with it a high risk of complications and morbidity.  The differential diagnosis for generalized abdominal pain includes, but is not limited to AAA, gastroenteritis, appendicitis, Bowel obstruction, Bowel perforation. Gastroparesis, DKA, Hernia, Inflammatory bowel disease, mesenteric ischemia,  pancreatitis, peritonitis SBP, volvulus.  Co morbidities that complicate the patient evaluation   10 visits in the past 2 months for similar,left inguinal hernia repair in December 2012, major depressive disorder, anxiety, insomnia, depression, hypertension  My initial workup includes basic labs, Chest x-ray, CT abdomen pelvis  Additional history obtained from: Nursing notes from this visit. Previous records within EMR system 10 ED visits recently for same  I ordered, reviewed and interpreted labs which include: CBC,  CMP, lipase.  Hyponatremia of 130 which is similar to past, marginally elevated lipase  I ordered imaging studies including chest x-ray, CT abdomen pelvis I independently visualized and interpreted imaging which showed no abnormalities on chest x-ray, CT abdomen pelvis showed no obstruction or acute emergencies.  Did show significant stool burden I agree with the radiologist interpretation  Afebrile, hemodynamically stable.  67 year old male presenting to the ED for evaluation of lower abdominal pain.  This has been an ongoing issue for the past 2 months.  He has had numerous scans for this which showed no acute abnormalities but all show at least moderate stool burdens.  He has been intermittently using laxatives with success but does not use these regularly.  He has not follow-up with his general surgeon or GI providers but states he has an appointment with his GI provider in 2 months.  He is also complaining of some left upper and lower extremity numbness and tingling that resolved while he was in the ED.  He has complained of this in the past and has had unremarkable CTs of his head.  He declined an MRI of his head in the past.  This symptom has been intermittent for the past 2 months as well.  Low suspicion for acute intracranial abnormalities.  Patient was adamant on receiving a CT of his abdomen and pelvis today.  This was ordered and showed no acute emergent abnormalities but  did show a large stool burden again.  He was encouraged to take laxatives on a scheduled basis in order to have a more normal bowel pattern.  He also reported some mild shortness of breath on arrival and stated it was due to aspiration of food.  He states that this resolved while in the ED.  Chest x-ray negative.  He was also initially complaining of left upper and lower extremity numbness and tingling.  This also resolved while he was in the ED.  He has also been seen for this and had a negative workup recently.  He was given return precautions.  Stable at discharge.  At this time there does not appear to be any evidence of an acute emergency medical condition and the patient appears stable for discharge with appropriate outpatient follow up. Diagnosis was discussed with patient who verbalizes understanding of care plan and is agreeable to discharge. I have discussed return precautions with patient who verbalizes understanding. Patient encouraged to follow-up with their PCP within 1 week. All questions answered.  Patient's case discussed with Dr. Darl Householder who agrees with plan to discharge with follow-up.   Note: Portions of this report may have been transcribed using voice recognition software. Every effort was made to ensure accuracy; however, inadvertent computerized transcription errors may still be present.        Final Clinical Impression(s) / ED Diagnoses Final diagnoses:  Constipation, unspecified constipation type  Lower abdominal pain    Rx / DC Orders ED Discharge Orders     None         Roylene Reason, Hershal Coria 10/11/22 2014    Drenda Freeze, MD 10/11/22 2242

## 2022-10-11 NOTE — ED Notes (Signed)
Pt tried to urinate, had difficulty and stated "I have to pee but I can't bend my arm and it hurts so bad".  Pt was speaking about the arm with his IV however after telling pt he can bend your arm but states that he cannot because it hurts so bad.  Pt also had issues walking to and from the bathroom requiring help to stabilize due to not straightening out his legs.  Pt is stating that he cannot move yet has to urinate very badly.  RN was notified.

## 2022-10-11 NOTE — Discharge Instructions (Addendum)
You have been seen today for your complaint of abdominal pain. Your lab work was reassuring. Your imaging was reassuring. Your discharge medications include MiraLAX and magnesium citrate.  You should use these daily as needed to have regular bowel movements.  You may also try Dulcolax.  You should increase your daily fiber intake. Follow up with: Your general surgeon.  He should call to schedule an appointment.  You should also follow-up with GI.  You should try to get in sooner if he can Please seek immediate medical care if you develop any of the following symptoms: Rectal bleeding or you pass blood clots. Severe rectal pain. Body tissue that pushes out (protrudes) from your anus. Severe pain or bloating (distension) in your abdomen. Vomiting that you cannot control. At this time there does not appear to be the presence of an emergent medical condition, however there is always the potential for conditions to change. Please read and follow the below instructions.  Do not take your medicine if  develop an itchy rash, swelling in your mouth or lips, or difficulty breathing; call 911 and seek immediate emergency medical attention if this occurs.  You may review your lab tests and imaging results in their entirety on your MyChart account.  Please discuss all results of fully with your primary care provider and other specialist at your follow-up visit.  Note: Portions of this text may have been transcribed using voice recognition software. Every effort was made to ensure accuracy; however, inadvertent computerized transcription errors may still be present.

## 2022-10-13 ENCOUNTER — Emergency Department (HOSPITAL_COMMUNITY): Payer: Medicare Other

## 2022-10-13 ENCOUNTER — Emergency Department (HOSPITAL_COMMUNITY)
Admission: EM | Admit: 2022-10-13 | Discharge: 2022-10-13 | Disposition: A | Discharge status: Home / Self Care | Payer: Medicare Other | Attending: Emergency Medicine | Admitting: Emergency Medicine

## 2022-10-13 ENCOUNTER — Other Ambulatory Visit: Payer: Self-pay

## 2022-10-13 DIAGNOSIS — Z1152 Encounter for screening for COVID-19: Secondary | ICD-10-CM | POA: Insufficient documentation

## 2022-10-13 DIAGNOSIS — Z79899 Other long term (current) drug therapy: Secondary | ICD-10-CM | POA: Insufficient documentation

## 2022-10-13 DIAGNOSIS — R000 Tachycardia, unspecified: Secondary | ICD-10-CM | POA: Diagnosis not present

## 2022-10-13 DIAGNOSIS — K4090 Unilateral inguinal hernia, without obstruction or gangrene, not specified as recurrent: Secondary | ICD-10-CM | POA: Insufficient documentation

## 2022-10-13 DIAGNOSIS — K5900 Constipation, unspecified: Secondary | ICD-10-CM | POA: Diagnosis not present

## 2022-10-13 DIAGNOSIS — R109 Unspecified abdominal pain: Secondary | ICD-10-CM | POA: Diagnosis present

## 2022-10-13 DIAGNOSIS — I10 Essential (primary) hypertension: Secondary | ICD-10-CM | POA: Insufficient documentation

## 2022-10-13 DIAGNOSIS — R1084 Generalized abdominal pain: Secondary | ICD-10-CM

## 2022-10-13 LAB — LIPASE, BLOOD: Lipase: 44 U/L (ref 11–51)

## 2022-10-13 LAB — URINALYSIS, ROUTINE W REFLEX MICROSCOPIC
Bilirubin Urine: NEGATIVE
Glucose, UA: NEGATIVE mg/dL
Hgb urine dipstick: NEGATIVE
Ketones, ur: NEGATIVE mg/dL
Leukocytes,Ua: NEGATIVE
Nitrite: NEGATIVE
Protein, ur: NEGATIVE mg/dL
Specific Gravity, Urine: 1.033 — ABNORMAL HIGH (ref 1.005–1.030)
pH: 6 (ref 5.0–8.0)

## 2022-10-13 LAB — COMPREHENSIVE METABOLIC PANEL
ALT: 25 U/L (ref 0–44)
AST: 28 U/L (ref 15–41)
Albumin: 4.3 g/dL (ref 3.5–5.0)
Alkaline Phosphatase: 56 U/L (ref 38–126)
Anion gap: 11 (ref 5–15)
BUN: 15 mg/dL (ref 8–23)
CO2: 21 mmol/L — ABNORMAL LOW (ref 22–32)
Calcium: 9.1 mg/dL (ref 8.9–10.3)
Chloride: 98 mmol/L (ref 98–111)
Creatinine, Ser: 0.81 mg/dL (ref 0.61–1.24)
GFR, Estimated: >60 mL/min (ref >60)
Glucose, Bld: 100 mg/dL — ABNORMAL HIGH (ref 70–99)
Potassium: 3.8 mmol/L (ref 3.5–5.1)
Sodium: 130 mmol/L — ABNORMAL LOW (ref 135–145)
Total Bilirubin: 1 mg/dL (ref 0.3–1.2)
Total Protein: 6.7 g/dL (ref 6.5–8.1)

## 2022-10-13 LAB — CBC WITH DIFFERENTIAL/PLATELET
Abs Immature Granulocytes: 0.01 10*3/uL (ref 0.00–0.07)
Basophils Absolute: 0.1 10*3/uL (ref 0.0–0.1)
Basophils Relative: 1 %
Eosinophils Absolute: 0 10*3/uL (ref 0.0–0.5)
Eosinophils Relative: 0 %
HCT: 39.2 % (ref 39.0–52.0)
Hemoglobin: 13.7 g/dL (ref 13.0–17.0)
Immature Granulocytes: 0 %
Lymphocytes Relative: 22 %
Lymphs Abs: 1.4 10*3/uL (ref 0.7–4.0)
MCH: 31.4 pg (ref 26.0–34.0)
MCHC: 34.9 g/dL (ref 30.0–36.0)
MCV: 89.9 fL (ref 80.0–100.0)
Monocytes Absolute: 0.5 10*3/uL (ref 0.1–1.0)
Monocytes Relative: 8 %
Neutro Abs: 4.2 10*3/uL (ref 1.7–7.7)
Neutrophils Relative %: 69 %
Platelets: 196 10*3/uL (ref 150–400)
RBC: 4.36 MIL/uL (ref 4.22–5.81)
RDW: 12.9 % (ref 11.5–15.5)
WBC: 6.1 10*3/uL (ref 4.0–10.5)
nRBC: 0 % (ref 0.0–0.2)

## 2022-10-13 LAB — RESP PANEL BY RT-PCR (RSV, FLU A&B, COVID)  RVPGX2
Influenza A by PCR: NEGATIVE
Influenza B by PCR: NEGATIVE
Resp Syncytial Virus by PCR: NEGATIVE
SARS Coronavirus 2 by RT PCR: NEGATIVE

## 2022-10-13 MED ORDER — ONDANSETRON 4 MG PO TBDP
4.0000 mg | ORAL_TABLET | Freq: Once | ORAL | Status: AC
  Administered 2022-10-13: 4 mg via ORAL
  Filled 2022-10-13: qty 1

## 2022-10-13 MED ORDER — SENNOSIDES-DOCUSATE SODIUM 8.6-50 MG PO TABS: 1.0000 | ORAL_TABLET | Freq: Every evening | ORAL | 0 refills | Status: AC | PRN

## 2022-10-13 MED ORDER — MORPHINE SULFATE (PF) 4 MG/ML IV SOLN
4.0000 mg | Freq: Once | INTRAVENOUS | Status: AC
  Administered 2022-10-13: 4 mg via INTRAVENOUS
  Filled 2022-10-13: qty 1

## 2022-10-13 MED ORDER — DICYCLOMINE HCL 20 MG PO TABS: 20.0000 mg | ORAL_TABLET | Freq: Three times a day (TID) | ORAL | 0 refills | Status: DC | PRN

## 2022-10-13 MED ORDER — IOHEXOL 300 MG/ML  SOLN
100.0000 mL | Freq: Once | INTRAMUSCULAR | Status: AC | PRN
  Administered 2022-10-13: 100 mL via INTRAVENOUS

## 2022-10-13 MED ORDER — SODIUM CHLORIDE 0.9 % IV BOLUS
1000.0000 mL | Freq: Once | INTRAVENOUS | Status: AC
  Administered 2022-10-13: 1000 mL via INTRAVENOUS

## 2022-10-13 NOTE — ED Provider Triage Note (Signed)
Emergency Medicine Provider Triage Evaluation Note  Brad [name] , a 67 y.o. male  was evaluated in triage.  Pt complains of constipation. States last night he has had worsening lower abdominal and pelvic pain. States that he had a small bowel movement last night but before that has been about a week. He is using laxatives with no help. Has lost about 40 lbs in 3 months. No appetite and decreased PO intake. States he is also having pain worse in his left side that radiates into his left testicle. Has had swelling in his left testicle and redness. States he has had urinary retention. Has had decreased urine output.   Review of Systems  Positive: See above Negative:   Physical Exam  BP (!) 144/104 (BP Location: Right Arm)   Pulse (!) 116   Temp 98.5 F (36.9 C) (Oral)   Resp 18   Ht 5' 9"$  (1.753 m)   Wt 60 kg   SpO2 95%   BMI 19.53 kg/m  Gen:   Awake, very uncomfortable on exam  Resp:  Normal effort  MSK:   Moves extremities without difficulty  Other:    Medical Decision Making  Medically screening exam initiated at 1:54 PM.  Appropriate orders placed.  Brad [name] was informed that the remainder of the evaluation will be completed by another provider, this initial triage assessment does not replace that evaluation, and the importance of remaining in the ED until their evaluation is complete.     Mickie Hillier, PA-C 10/13/22 [id number]

## 2022-10-13 NOTE — ED Notes (Signed)
Pt denied soap suds enema prior to discharge being processed.

## 2022-10-13 NOTE — ED Notes (Signed)
Pt refused vitals and temp

## 2022-10-13 NOTE — ED Triage Notes (Signed)
BIB EMS from Dentist office with abd pain, known hernia, pain in groin area to anus. 152/98-102-98% 20. Pt would not allow CBG

## 2022-10-13 NOTE — ED Provider Notes (Signed)
[facility] Provider Note   CSN: [id number] Arrival date & time: 10/13/22  1322     History  Chief Complaint  Patient presents with   Abdominal Pain    Brad [name] is a 67 y.o. male with history of left inguinal hernia repair in December 2023, multiple psychiatric comorbidities, HTN, who presents to ED for evaluation of abdominal pain radiating to left lower quadrant and into his groin. He notes long-standing history of abdominal pain and constipation but has had no relief of pain with medication including milk of magnesia and magnesium citrate at home. Last bowel movement was this morning and was non-bloody. No relief of pain following bowel movement this morning. Notes because of the pain he has not been eating or drinking well at home but denies nausea or vomiting. He is passing gas. Other complaints include difficulty swallowing today stating he does not feel like his secretions are going down his throat adequately. He also states he is concerned he is retaining urine but is able to void he just has pain in his abdomen when he attempts to do so and describes it as not wanting to urinate secondary to his pain. He denies fever, chills, dysuria, hematuria, chest pain, shortness of breath, cough, rectal bleeding, or congestion. He denies injury to groin and denies possibility of STDs.      Home Medications Prior to Admission medications   Medication Sig Start Date End Date Taking? Authorizing Provider  dicyclomine (BENTYL) 20 MG tablet Take 1 tablet (20 mg total) by mouth 3 (three) times daily as needed for spasms. 10/13/22  Yes Long, Wonda Olds, MD  senna-docusate (SENOKOT-S) 8.6-50 MG tablet Take 1 tablet by mouth at bedtime as needed for mild constipation. 10/13/22  Yes Long, Wonda Olds, MD  Alpha-Lipoic Acid 600 MG CAPS Take 1 capsule by mouth daily.    [provider]  amLODipine (NORVASC) 5 MG tablet Take 2 tablets (10 mg total) by mouth  daily. Patient taking differently: Take 5 mg by mouth daily. 03/02/19   Johnn Hai, MD  atorvastatin (LIPITOR) 10 MG tablet Take 10 mg by mouth 3 (three) times a week. MWF 08/10/19   [provider]  busPIRone (BUSPAR) 10 MG tablet Take 20 mg by mouth 3 (three) times daily.     [provider]  Cholecalciferol (VITAMIN D3) 125 MCG (5000 UT) TABS Take 1 tablet by mouth daily.    [provider]  Coenzyme Q10 50 MG CAPS Take 1 capsule by mouth daily.    [provider]  docusate sodium (COLACE) 100 MG capsule Take 1 capsule (100 mg total) by mouth every 12 (twelve) hours. 09/26/22   Valarie Merino, MD  LORazepam (ATIVAN) 1 MG tablet Take 1 mg by mouth 2 (two) times daily. 10/26/18   [provider]  losartan (COZAAR) 100 MG tablet Take 100 mg by mouth daily. 02/04/20   [provider]  memantine (NAMENDA) 10 MG tablet Take 1 tablet (10 mg total) by mouth 2 (two) times daily. Patient not taking: Reported on 08/20/2022 03/02/19   Johnn Hai, MD  Methylfol-Methylcob-Acetylcyst (CEREFOLIN NAC) 6-2-600 MG TABS 1 a day Patient not taking: Reported on 08/20/2022 03/02/19   Johnn Hai, MD  mirtazapine (REMERON) 30 MG tablet Take 30 mg by mouth at bedtime.    [provider]  Multiple Vitamin (MULTIVITAMIN) tablet Take 1 tablet by mouth daily.    [provider]  Omega-3 1000  MG CAPS Take 1 capsule by mouth daily.    [provider]  oxyCODONE (OXY IR/ROXICODONE) 5 MG immediate release tablet Take 1 tablet (5 mg total) by mouth every 6 (six) hours as needed for severe pain or breakthrough pain. Patient not taking: Reported on 09/30/2022 08/23/22   Michael Boston, MD  polyethylene glycol (MIRALAX) 17 g packet Take 17 g by mouth daily. 08/26/22   Rancour, Annie Main, MD  pregabalin (LYRICA) 75 MG capsule Take 75 mg by mouth 2 (two) times daily.    [provider]  Prenatal Vit-Fe Fumarate-FA (PRENATAL MULTIVITAMIN) TABS tablet  Take 1 tablet by mouth daily. Patient not taking: Reported on 08/20/2022 03/03/19   Johnn Hai, MD  traZODone (DESYREL) 50 MG tablet Take 50 mg by mouth at bedtime.    [provider]  zaleplon (SONATA) 10 MG capsule Take 10 mg by mouth at bedtime as needed for sleep.    [provider]  zinc gluconate 50 MG tablet Take 50 mg by mouth daily.    [provider]  zolpidem (AMBIEN CR) 12.5 MG CR tablet Take 12.5 mg by mouth at bedtime.     [provider]      Allergies    Duloxetine hcl, Gabapentin, and Trileptal [oxcarbazepine]    Review of Systems   Review of Systems  All other systems reviewed and are negative.   Physical Exam Updated Vital Signs BP (!) 158/94   Pulse 88   Temp 98.5 F (36.9 C)   Resp 18   Ht 5' 9"$  (1.753 m)   Wt 60 kg   SpO2 100%   BMI 19.53 kg/m  Physical Exam Vitals and nursing note reviewed. Exam conducted with a chaperone present.  Constitutional:      General: He is not in acute distress.    Appearance: Normal appearance. He is not ill-appearing, toxic-appearing or diaphoretic.  HENT:     Head: Normocephalic and atraumatic.     Mouth/Throat:     Mouth: Mucous membranes are moist.     Pharynx: Oropharynx is clear. Uvula midline. No pharyngeal swelling, oropharyngeal exudate or uvula swelling.     Comments: Airway patent and clear Eyes:     General: No scleral icterus.    Conjunctiva/sclera: Conjunctivae normal.  Cardiovascular:     Rate and Rhythm: Regular rhythm. Tachycardia present.     Heart sounds: No murmur heard. Pulmonary:     Effort: Pulmonary effort is normal. No respiratory distress.     Breath sounds: Normal breath sounds. No stridor. No wheezing, rhonchi or rales.  Abdominal:     General: Abdomen is flat.     Palpations: Abdomen is soft.     Tenderness: There is generalized abdominal tenderness. There is right CVA tenderness. There is no left CVA tenderness, guarding or rebound. Negative signs  include Murphy's sign, Rovsing's sign, McBurney's sign and psoas sign.     Hernia: A hernia is present. Hernia is present in the left inguinal area (questionable, small at area of previous incision from repair but this is soft and without overlying skin changes).  Genitourinary:    Pubic Area: No rash.      Penis: Normal. No phimosis, paraphimosis, erythema, tenderness, discharge, swelling or lesions.      Testes: Normal.        Right: Tenderness or swelling not present.        Left: Tenderness or swelling not present.     Epididymis:     Right:  Normal. Not inflamed or enlarged. No mass or tenderness.     Left: Normal. Not inflamed or enlarged. No mass or tenderness.     Comments: Performed with primary RN as chaperone Musculoskeletal:        General: Normal range of motion.     Cervical back: Neck supple.     Right lower leg: No edema.     Left lower leg: No edema.     Comments: Moving all extremities with ease x 4 without signs of deformity  Skin:    General: Skin is warm and dry.     Capillary Refill: Capillary refill takes less than 2 seconds.     Findings: No rash.  Neurological:     Mental Status: He is alert. Mental status is at baseline.  Psychiatric:        Mood and Affect: Mood is anxious (significant). Affect is labile and blunt.        Behavior: Behavior is cooperative.     ED Results / Procedures / Treatments   Labs (all labs ordered are listed, but only abnormal results are displayed) Labs Reviewed  COMPREHENSIVE METABOLIC PANEL - Abnormal; Notable for the following components:      Result Value   Sodium 130 (*)    CO2 21 (*)    Glucose, Bld 100 (*)    All other components within normal limits  URINALYSIS, ROUTINE W REFLEX MICROSCOPIC - Abnormal; Notable for the following components:   Color, Urine STRAW (*)    Specific Gravity, Urine 1.033 (*)    All other components within normal limits  RESP PANEL BY RT-PCR (RSV, FLU A&B, COVID)  [id number]  CBC WITH  DIFFERENTIAL/PLATELET  LIPASE, BLOOD    EKG None  Radiology DG Shoulder Right  Result Date: 10/13/2022 CLINICAL DATA:  Posterior shoulder pain. EXAM: RIGHT SHOULDER - 2+ VIEW COMPARISON:  None Available. FINDINGS: AP and Y-views obtained portably are submitted. There is some rotation on the Y-view, and humeral head may be slightly subluxed anteriorly relative to the glenoid with resulting narrowing of the subcoracoid space. No frank dislocation. No evidence of acute fracture. Mild glenohumeral and acromioclavicular degenerative changes. There are postsurgical changes in the lower cervical spine. IMPRESSION: Possible mild anterior subluxation of the humeral head relative to the glenoid. No frank dislocation or acute fracture. Electronically Signed   By: Richardean Sale M.D.   On: 10/13/2022 17:51   DG Chest 2 View  Result Date: 10/13/2022 CLINICAL DATA:  Difficulty swallowing. EXAM: CHEST - 2 VIEW COMPARISON:  October 11, 2022. FINDINGS: Stable cardiomediastinal silhouette. Both lungs are clear. The visualized skeletal structures are unremarkable. IMPRESSION: No active cardiopulmonary disease. Electronically Signed   By: Marijo Conception M.D.   On: 10/13/2022 16:32   DG Neck Soft Tissue  Result Date: 10/13/2022 CLINICAL DATA:  Difficulty swallowing. EXAM: NECK SOFT TISSUES - 1+ VIEW COMPARISON:  None Available. FINDINGS: There is no evidence of retropharyngeal soft tissue swelling or epiglottic enlargement. The cervical airway is unremarkable. Patient is status post surgical anterior fusion of C5-6, C6-7 and C7-T1. IMPRESSION: Postsurgical changes as described above. No other definite abnormality seen. Electronically Signed   By: Marijo Conception M.D.   On: 10/13/2022 16:30   CT Abdomen Pelvis W Contrast  Result Date: 10/13/2022 CLINICAL DATA:  Bowel obstruction suspected, abdominal pain EXAM: CT ABDOMEN AND PELVIS WITH CONTRAST TECHNIQUE: Multidetector CT imaging of the abdomen and pelvis was  performed using the standard protocol following bolus administration  of intravenous contrast. RADIATION DOSE REDUCTION: This exam was performed according to the departmental dose-optimization program which includes automated exposure control, adjustment of the mA and/or kV according to patient size and/or use of iterative reconstruction technique. CONTRAST:  166m OMNIPAQUE IOHEXOL 300 MG/ML  SOLN COMPARISON:  10/11/2022 FINDINGS: Lower chest: No acute abnormality. Hepatobiliary: No solid liver abnormality is seen. No gallstones, gallbladder wall thickening, or biliary dilatation. Pancreas: Unremarkable. No pancreatic ductal dilatation or surrounding inflammatory changes. Spleen: Normal in size without significant abnormality. Adrenals/Urinary Tract: Adrenal glands are unremarkable. Kidneys are normal, without renal calculi, solid lesion, or hydronephrosis. Bladder is unremarkable. Stomach/Bowel: Stomach is within normal limits. Appendix not clearly visualized. No evidence of bowel wall thickening, distention, or inflammatory changes. Sigmoid diverticula. Moderate burden of stool throughout the colon. Vascular/Lymphatic: Aortic atherosclerosis. No enlarged abdominal or pelvic lymph nodes. Reproductive: No mass or other significant abnormality. Other: No abdominal wall hernia or abnormality. No ascites. Musculoskeletal: No acute or significant osseous findings. IMPRESSION: 1. No acute CT findings of the abdomen or pelvis to explain abdominal pain. Specifically, no evidence of bowel obstruction. 2. Sigmoid diverticulosis without evidence of acute diverticulitis. 3. Moderate burden of stool throughout the colon. Aortic Atherosclerosis (ICD10-I70.0). Electronically Signed   By: ADelanna AhmadiM.D.   On: 10/13/2022 15:42   [place]orea[place]CROTUM W/DOPPLER  Result Date: 10/13/2022 CLINICAL DATA:  Left testicular pain.  Evaluate for torsion. EXAM: SCROTAL ULTRASOUND DOPPLER ULTRASOUND OF THE TESTICLES TECHNIQUE: Complete ultrasound  examination of the testicles, epididymis, and other scrotal structures was performed. Color and spectral Doppler ultrasound were also utilized to evaluate blood flow to the testicles. COMPARISON:  None available FINDINGS: Right testicle Measurements: 3.8 x 1.8 x 2.3 cm. No mass or microlithiasis visualized. Left testicle Measurements: 4.1 x 2.4 x 2.5 cm. No mass or microlithiasis visualized. Right epididymis:  Normal in size and appearance. Left epididymis:  Normal in size and appearance. Hydrocele:  Small bilateral hydroceles, larger on the left. Varicocele:  None visualized. Pulsed Doppler interrogation of both testes demonstrates normal low resistance arterial and venous waveforms bilaterally. IMPRESSION: 1. No evidence of testicular torsion. 2. Small bilateral hydroceles, larger on the left. Electronically Signed   By: FMiachel RouxM.D.   On: 10/13/2022 15:20    Procedures Procedures    Medications Ordered in ED Medications  ondansetron (ZOFRAN-ODT) disintegrating tablet 4 mg (4 mg Oral Given 10/13/22 1545)  sodium chloride 0.9 % bolus 1,000 mL (0 mLs Intravenous Stopped 10/13/22 1919)  morphine (PF) 4 MG/ML injection 4 mg (4 mg Intravenous Given 10/13/22 1545)  iohexol (OMNIPAQUE) 300 MG/ML solution 100 mL (100 mLs Intravenous Contrast Given 10/13/22 1525)  morphine (PF) 4 MG/ML injection 4 mg (4 mg Intravenous Given 10/13/22 1923)    ED Course/ Medical Decision Making/ A&P                             Medical Decision Making Amount and/or Complexity of Data Reviewed Labs: ordered. Decision-making details documented in ED Course. Radiology: ordered. Decision-making details documented in ED Course. ECG/medicine tests: ordered. Decision-making details documented in ED Course.  Risk OTC drugs. Prescription drug management. Decision regarding hospitalization.   Medical Decision Making:   Brad HAILESis a 67y.o. male who presented to the ED today with abdominal pain, detailed above.     External chart has been reviewed including recent ED visits. Patient's presentation is complicated by their history of multiple psychiatric comorbidities.  Patient placed on continuous vitals and telemetry monitoring while in ED which was reviewed periodically.  Complete initial physical exam performed, notably the patient was extremely anxious and restless with multiple complaints.  Remainder of exam as above.     Reviewed and confirmed nursing documentation for past medical history, family history, social history.    Initial Assessment:   With the patient's presentation of abdominal pain, most likely diagnosis is constipation. Other diagnoses were considered including (but not limited to) gastroenteritis, colitis, small bowel obstruction, appendicitis, cholecystitis, pancreatitis, nephrolithiasis, UTI, pyleonephritis,testicular torsion, dehydration, electrolyte disturbance, shoulder dislocation, airway compromise, AKI, urinary outflow obstruction, viral illness, health anxiety, strangulated hernia, incarcerated hernia. These are considered less likely due to history of present illness and physical exam findings.   This is most consistent with an acute complicated illness   Initial Plan:  CBC/CMP to evaluate for underlying infectious/metabolic etiology for patient's abdominal pain  Lipase to evaluate for pancreatitis  CTAB/Pelvis with contrast to evaluate for structural/surgical etiology of patients' severe abdominal pain.  Testicular US to evaluate for torsion  Urinalysis and repeat physical assessment to evaluate for UTI/Pyelonpehritis  Empiric management of symptoms with escalating pain control and antiemetics as needed.  Right shoulder x-ray to assess for fracture versus dislocation Neck and chest x-ray to assess for airway compromise or other abnormalities IV hydration  Initial Study Results:   Laboratory  All laboratory results reviewed without evidence of clinically relevant  pathology.   Exceptions include: Sodium 130  Radiology All images reviewed independently. Agree with radiology report at this time.  **Note some of the below imaging was obtained at prior ED visits** DG Shoulder Right  Result Date: 10/13/2022 CLINICAL DATA:  Posterior shoulder pain. EXAM: RIGHT SHOULDER - 2+ VIEW COMPARISON:  None Available. FINDINGS: AP and Y-views obtained portably are submitted. There is some rotation on the Y-view, and humeral head may be slightly subluxed anteriorly relative to the glenoid with resulting narrowing of the subcoracoid space. No frank dislocation. No evidence of acute fracture. Mild glenohumeral and acromioclavicular degenerative changes. There are postsurgical changes in the lower cervical spine. IMPRESSION: Possible mild anterior subluxation of the humeral head relative to the glenoid. No frank dislocation or acute fracture. Electronically Signed   By: Richardean Sale M.D.   On: 10/13/2022 17:51   DG Chest 2 View  Result Date: 10/13/2022 CLINICAL DATA:  Difficulty swallowing. EXAM: CHEST - 2 VIEW COMPARISON:  October 11, 2022. FINDINGS: Stable cardiomediastinal silhouette. Both lungs are clear. The visualized skeletal structures are unremarkable. IMPRESSION: No active cardiopulmonary disease. Electronically Signed   By: Marijo Conception M.D.   On: 10/13/2022 16:32   DG Neck Soft Tissue  Result Date: 10/13/2022 CLINICAL DATA:  Difficulty swallowing. EXAM: NECK SOFT TISSUES - 1+ VIEW COMPARISON:  None Available. FINDINGS: There is no evidence of retropharyngeal soft tissue swelling or epiglottic enlargement. The cervical airway is unremarkable. Patient is status post surgical anterior fusion of C5-6, C6-7 and C7-T1. IMPRESSION: Postsurgical changes as described above. No other definite abnormality seen. Electronically Signed   By: Marijo Conception M.D.   On: 10/13/2022 16:30   CT Abdomen Pelvis W Contrast  Result Date: 10/13/2022 CLINICAL DATA:  Bowel obstruction  suspected, abdominal pain EXAM: CT ABDOMEN AND PELVIS WITH CONTRAST TECHNIQUE: Multidetector CT imaging of the abdomen and pelvis was performed using the standard protocol following bolus administration of intravenous contrast. RADIATION DOSE REDUCTION: This exam was performed according to the departmental dose-optimization program which includes automated exposure  control, adjustment of the mA and/or kV according to patient size and/or use of iterative reconstruction technique. CONTRAST:  1103m OMNIPAQUE IOHEXOL 300 MG/ML  SOLN COMPARISON:  10/11/2022 FINDINGS: Lower chest: No acute abnormality. Hepatobiliary: No solid liver abnormality is seen. No gallstones, gallbladder wall thickening, or biliary dilatation. Pancreas: Unremarkable. No pancreatic ductal dilatation or surrounding inflammatory changes. Spleen: Normal in size without significant abnormality. Adrenals/Urinary Tract: Adrenal glands are unremarkable. Kidneys are normal, without renal calculi, solid lesion, or hydronephrosis. Bladder is unremarkable. Stomach/Bowel: Stomach is within normal limits. Appendix not clearly visualized. No evidence of bowel wall thickening, distention, or inflammatory changes. Sigmoid diverticula. Moderate burden of stool throughout the colon. Vascular/Lymphatic: Aortic atherosclerosis. No enlarged abdominal or pelvic lymph nodes. Reproductive: No mass or other significant abnormality. Other: No abdominal wall hernia or abnormality. No ascites. Musculoskeletal: No acute or significant osseous findings. IMPRESSION: 1. No acute CT findings of the abdomen or pelvis to explain abdominal pain. Specifically, no evidence of bowel obstruction. 2. Sigmoid diverticulosis without evidence of acute diverticulitis. 3. Moderate burden of stool throughout the colon. Aortic Atherosclerosis (ICD10-I70.0). Electronically Signed   By: ADelanna AhmadiM.D.   On: 10/13/2022 15:42   [place]CROTUM W/DOPPLER  Result Date: 10/13/2022 CLINICAL DATA:   Left testicular pain.  Evaluate for torsion. EXAM: SCROTAL ULTRASOUND DOPPLER ULTRASOUND OF THE TESTICLES TECHNIQUE: Complete ultrasound examination of the testicles, epididymis, and other scrotal structures was performed. Color and spectral Doppler ultrasound were also utilized to evaluate blood flow to the testicles. COMPARISON:  None available FINDINGS: Right testicle Measurements: 3.8 x 1.8 x 2.3 cm. No mass or microlithiasis visualized. Left testicle Measurements: 4.1 x 2.4 x 2.5 cm. No mass or microlithiasis visualized. Right epididymis:  Normal in size and appearance. Left epididymis:  Normal in size and appearance. Hydrocele:  Small bilateral hydroceles, larger on the left. Varicocele:  None visualized. Pulsed Doppler interrogation of both testes demonstrates normal low resistance arterial and venous waveforms bilaterally. IMPRESSION: 1. No evidence of testicular torsion. 2. Small bilateral hydroceles, larger on the left. Electronically Signed   By: FMiachel RouxM.D.   On: 10/13/2022 15:20   CT Abdomen Pelvis W Contrast  Result Date: 10/11/2022 CLINICAL DATA:  Bowel obstruction suspected. Left-sided abdominal pain. EXAM: CT ABDOMEN AND PELVIS WITH CONTRAST TECHNIQUE: Multidetector CT imaging of the abdomen and pelvis was performed using the standard protocol following bolus administration of intravenous contrast. RADIATION DOSE REDUCTION: This exam was performed according to the departmental dose-optimization program which includes automated exposure control, adjustment of the mA and/or kV according to patient size and/or use of iterative reconstruction technique. CONTRAST:  729mOMNIPAQUE IOHEXOL 350 MG/ML SOLN COMPARISON:  CT abdomen and pelvis 09/26/2022 FINDINGS: Lower chest: No acute abnormality. Hepatobiliary: Subcentimeter cysts in the liver appear unchanged. Gallbladder and bile ducts are within normal limits. Pancreas: Unremarkable. No pancreatic ductal dilatation or surrounding inflammatory  changes. Spleen: Normal in size without focal abnormality. Adrenals/Urinary Tract: Adrenal glands are unremarkable. Kidneys are normal, without renal calculi, focal lesion, or hydronephrosis. Bladder is unremarkable. Stomach/Bowel: Stomach is within normal limits. Appendix is not seen. No evidence of bowel wall thickening, distention, or inflammatory changes. There is gaseous distention of small bowel with some scattered air-fluid levels. Vascular/Lymphatic: Aortic atherosclerosis. No enlarged abdominal or pelvic lymph nodes. Reproductive: Prostate is unremarkable. Other: No abdominal wall hernia or abnormality. No abdominopelvic ascites. Musculoskeletal: Degenerative changes affect the spine. IMPRESSION: 1. Gaseous distention of small bowel with scattered air-fluid levels. Findings can be seen in the  setting of ileus or enteritis. No bowel obstruction. Aortic Atherosclerosis (ICD10-I70.0). Electronically Signed   By: Ronney Asters M.D.   On: 10/11/2022 19:25   DG Chest 2 View  Result Date: 10/11/2022 CLINICAL DATA:  Aspiration EXAM: CHEST - 2 VIEW COMPARISON:  Chest x-ray 10/02/2022 FINDINGS: The heart size and mediastinal contours are within normal limits. Both lungs are clear. There is dextroconvex scoliosis of the thoracic spine. Cervical spinal fusion plate is present. No acute fractures are seen. IMPRESSION: No active cardiopulmonary disease. Electronically Signed   By: Ronney Asters M.D.   On: 10/11/2022 17:12   CT HEAD WO CONTRAST  Result Date: 10/02/2022 CLINICAL DATA:  Headache EXAM: CT HEAD WITHOUT CONTRAST TECHNIQUE: Contiguous axial images were obtained from the base of the skull through the vertex without intravenous contrast. RADIATION DOSE REDUCTION: This exam was performed according to the departmental dose-optimization program which includes automated exposure control, adjustment of the mA and/or kV according to patient size and/or use of iterative reconstruction technique. COMPARISON:  CT  Head 02/26/19 FINDINGS: Brain: No hemorrhage. No CT evidence of an acute infarct. No hydrocephalus. No extra-axial fluid collection. No midline shift. Vascular: No hyperdense vessel or unexpected calcification. Skull: Normal. Negative for fracture or focal lesion. Sinuses/Orbits: No mastoid or middle ear effusion. Foramina cerumen in the left EAC. Paranasal sinuses are clear. Orbits are unremarkable. Other: None. IMPRESSION: No specific etiology for headaches or altered mental status identified. Electronically Signed   By: Marin Roberts M.D.   On: 10/02/2022 11:02   DG Chest Port 1 View  Result Date: 10/02/2022 CLINICAL DATA:  Patient complains of pain in lungs after cleaning croup came to his house. EXAM: PORTABLE CHEST 1 VIEW COMPARISON:  09/10/2022 FINDINGS: Stable cardiomediastinal contours. Heart size is normal. No pleural effusion or edema. No airspace opacities identified. Thoracic scoliosis with convexity towards the right. No acute osseous abnormality. Stable ACDF within the lower cervical spine IMPRESSION: 1. No acute cardiopulmonary abnormalities. 2. Thoracic scoliosis. Electronically Signed   By: Kerby Moors M.D.   On: 10/02/2022 10:11   US Abdomen Limited  Result Date: 09/30/2022 CLINICAL DATA:  Right upper quadrant pain for 2 days. EXAM: ULTRASOUND ABDOMEN LIMITED RIGHT UPPER QUADRANT COMPARISON:  09/26/2022. FINDINGS: Gallbladder: No stones are seen within the gallbladder. There is gallbladder wall thickening measuring 5 mm. No pericholecystic fluid. No sonographic Murphy sign noted by sonographer. Common bile duct: Diameter: 5 mm Liver: A small septated cyst is noted in the right lobe of the liver measuring 0.9 x 0.9 x 1.0 cm. Increased parenchymal echogenicity. Portal vein is patent on color Doppler imaging with normal direction of blood flow towards the liver. Other: No free fluid. IMPRESSION: 1. Gallbladder wall thickening with no evidence of cholelithiasis and no Murphy sign. Correlate  clinically to exclude chronic cholecystitis versus local inflammatory changes. 2. Hepatic steatosis and cyst. Electronically Signed   By: Brett Fairy M.D.   On: 09/30/2022 02:39   CT ABDOMEN PELVIS W CONTRAST  Result Date: 09/26/2022 CLINICAL DATA:  Abdominal pain EXAM: CT ABDOMEN AND PELVIS WITH CONTRAST TECHNIQUE: Multidetector CT imaging of the abdomen and pelvis was performed using the standard protocol following bolus administration of intravenous contrast. RADIATION DOSE REDUCTION: This exam was performed according to the departmental dose-optimization program which includes automated exposure control, adjustment of the mA and/or kV according to patient size and/or use of iterative reconstruction technique. CONTRAST:  141m OMNIPAQUE IOHEXOL 300 MG/ML  SOLN COMPARISON:  Multiple recent priors, including 09/10/2022, 08/30/2022,  and 08/18/2022 FINDINGS: Lower chest: Lung bases are clear. Hepatobiliary: Scattered small hepatic cysts, measuring up to 10 mm in segment 4B (series 3/image 20), benign. No follow-up is recommended. Gallbladder is unremarkable. No intrahepatic or extrahepatic ductal dilatation. Pancreas: Within normal limits. Spleen: Within normal limits. Adrenals/Urinary Tract: Adrenal glands are within normal limits. Kidneys are within normal limits.  No hydronephrosis. Bladder is within normal limits. Stomach/Bowel: Stomach is within normal limits. No evidence of bowel obstruction. Appendix is not discretely visualized. Moderate colonic stool burden, suggesting mild constipation. No colonic wall thickening or inflammatory changes. Vascular/Lymphatic: No evidence of abdominal aortic aneurysm. Atherosclerotic calcifications of the abdominal aorta and branch vessels. No suspicious abdominopelvic lymphadenopathy. Reproductive: Prostate is unremarkable. Other: No abdominopelvic ascites. Postprocedural changes in the left inguinal region. Musculoskeletal: Mild degenerative changes of the mid lumbar  spine. IMPRESSION: No CT findings to account for the patient's abdominal pain. No change from multiple recent priors. Electronically Signed   By: Julian Hy M.D.   On: 09/26/2022 19:47     Final Reassessment and Plan:   This is a 67 year old male presenting with multiple complaints as above.  On exam, patient is very anxious and restless and does appear to have significant anxiety regarding his health.  He has had many previous ED visits without significant findings on workup.  Most significant recent finding was constipation patient does state that he has had no relief with using laxatives at home.  He does have a mild hyponatremia but this is consistent with his previous baseline.  No other significant electrolyte abnormalities.  Imaging studies as above but essentially unremarkable apart from CT abdomen/pelvis which shows moderate stool burden but no bowel obstruction.  On multiple reexaminations, patient is very anxious when discussing his health and symptoms admission for "reevaluation."  Discussed with patient that current findings do not reveal the need for admission to the hospital pain medication redosed multiple times though does not appear to be providing patient with significant relief and he does appear frustrated discussing choice of pain medication received in the ED today.  Discussed case with attending MD who also reviewed patient's history and evaluated patient and agrees with plan to discharge home at this time.  Reiterated with patient the need to establish a relationship with a primary care provider and recommendation for outpatient follow-up with GI.  Patient expressed frustration with this plan.  With significant constipation on multiple recent CT scans, I offered patient enema while in the ED to provide him with some relief.  He declined this option.  Tolerating p.o., ambulating in ED without difficulty, and urinating with ease throughout ED stay. Given strict ED return  precautions.  Patient aware that it would be best for him to follow-up routinely with a PCP and/or any specialist they may recommend for his chronic complaints.  Stable at time of discharge.             Final Clinical Impression(s) / ED Diagnoses Final diagnoses:  Generalized abdominal pain  Constipation, unspecified constipation type    Rx / DC Orders ED Discharge Orders          Ordered    senna-docusate (SENOKOT-S) 8.6-50 MG tablet  At bedtime PRN        10/13/22 1922    dicyclomine (BENTYL) 20 MG tablet  3 times daily PRN        10/13/22 1922    Ambulatory referral to Gastroenterology        10/13/22 1922  Turner Daniels 10/13/22 2354    Margette Fast, MD 10/14/22 [phone]

## 2022-10-13 NOTE — Discharge Instructions (Addendum)

## 2022-10-17 ENCOUNTER — Emergency Department (HOSPITAL_COMMUNITY)
Admission: EM | Admit: 2022-10-17 | Discharge: 2022-10-17 | Disposition: A | Discharge status: Home / Self Care | Payer: Medicare Other | Attending: Emergency Medicine | Admitting: Emergency Medicine

## 2022-10-17 DIAGNOSIS — F419 Anxiety disorder, unspecified: Secondary | ICD-10-CM | POA: Diagnosis not present

## 2022-10-17 DIAGNOSIS — G894 Chronic pain syndrome: Secondary | ICD-10-CM | POA: Diagnosis not present

## 2022-10-17 DIAGNOSIS — R1030 Lower abdominal pain, unspecified: Secondary | ICD-10-CM | POA: Diagnosis present

## 2022-10-17 LAB — CBC WITH DIFFERENTIAL/PLATELET
Abs Immature Granulocytes: 0.02 10*3/uL (ref 0.00–0.07)
Basophils Absolute: 0 10*3/uL (ref 0.0–0.1)
Basophils Relative: 1 %
Eosinophils Absolute: 0 10*3/uL (ref 0.0–0.5)
Eosinophils Relative: 0 %
HCT: 39.9 % (ref 39.0–52.0)
Hemoglobin: 13.9 g/dL (ref 13.0–17.0)
Immature Granulocytes: 0 %
Lymphocytes Relative: 26 %
Lymphs Abs: 1.5 10*3/uL (ref 0.7–4.0)
MCH: 31.2 pg (ref 26.0–34.0)
MCHC: 34.8 g/dL (ref 30.0–36.0)
MCV: 89.5 fL (ref 80.0–100.0)
Monocytes Absolute: 0.5 10*3/uL (ref 0.1–1.0)
Monocytes Relative: 8 %
Neutro Abs: 3.8 10*3/uL (ref 1.7–7.7)
Neutrophils Relative %: 65 %
Platelets: 192 10*3/uL (ref 150–400)
RBC: 4.46 MIL/uL (ref 4.22–5.81)
RDW: 12.9 % (ref 11.5–15.5)
WBC: 5.8 10*3/uL (ref 4.0–10.5)
nRBC: 0 % (ref 0.0–0.2)

## 2022-10-17 LAB — COMPREHENSIVE METABOLIC PANEL
ALT: 23 U/L (ref 0–44)
AST: 25 U/L (ref 15–41)
Albumin: 4.9 g/dL (ref 3.5–5.0)
Alkaline Phosphatase: 63 U/L (ref 38–126)
Anion gap: 10 (ref 5–15)
BUN: 16 mg/dL (ref 8–23)
CO2: 22 mmol/L (ref 22–32)
Calcium: 9.3 mg/dL (ref 8.9–10.3)
Chloride: 101 mmol/L (ref 98–111)
Creatinine, Ser: 0.69 mg/dL (ref 0.61–1.24)
GFR, Estimated: >60 mL/min (ref >60)
Glucose, Bld: 106 mg/dL — ABNORMAL HIGH (ref 70–99)
Potassium: 3.5 mmol/L (ref 3.5–5.1)
Sodium: 133 mmol/L — ABNORMAL LOW (ref 135–145)
Total Bilirubin: 0.9 mg/dL (ref 0.3–1.2)
Total Protein: 7.2 g/dL (ref 6.5–8.1)

## 2022-10-17 LAB — LIPASE, BLOOD: Lipase: 45 U/L (ref 11–51)

## 2022-10-17 MED ORDER — ACETAMINOPHEN 500 MG PO TABS
1000.0000 mg | ORAL_TABLET | Freq: Once | ORAL | Status: DC
  Filled 2022-10-17: qty 2

## 2022-10-17 MED ORDER — KETOROLAC TROMETHAMINE 15 MG/ML IJ SOLN
15.0000 mg | Freq: Once | INTRAMUSCULAR | Status: AC
  Administered 2022-10-17: 15 mg via INTRAMUSCULAR
  Filled 2022-10-17: qty 1

## 2022-10-17 NOTE — ED Provider Triage Note (Signed)
Emergency Medicine Provider Triage Evaluation Note  Paolo Dubon Grilli , a 67 y.o. male  was evaluated in triage.  Pt complains of left groin pain that got worse today with some lower abdominal pain.  Patient had a history of hernia repair.  He also complains of neck pain started an hour ago.  States it is difficult for him to move his neck.  No difficulty with speaking or swallowing. No recent trauma or injury to his neck. He also states his HVAC at home "is blowing out fiberglass particles into his body".  No fever, chest pain, shortness of breath, nausea, vomiting.  Review of Systems  Positive: As above Negative: As above  Physical Exam  BP (!) 148/101 (BP Location: Right Arm)   Pulse (!) 110   Temp 97.9 F (36.6 C) (Oral)   Resp 16   SpO2 99%  Gen:   Awake, no distress   Resp:  Normal effort  MSK:   Moves extremities without difficulty  Other:    Medical Decision Making  Medically screening exam initiated at 3:10 PM.  Appropriate orders placed.  Amauri C Vadala was informed that the remainder of the evaluation will be completed by another provider, this initial triage assessment does not replace that evaluation, and the importance of remaining in the ED until their evaluation is complete.    Rex Kras, [place] 10/17/22 2302

## 2022-10-17 NOTE — Discharge Instructions (Addendum)
You were seen today for diffuse pain which you attributed to a material coming out of your air conditioning unit.   Your laboratory evaluation was reassuring.  We will treat you with Tylenol and a shot of Toradol here in the emergency room for your continued musculoskeletal pains.  Your chart discusses that you have had multiple episodes of the severe pain more consistent with a chronic pain syndrome. You have been referred to your primary care provider and I have attached resources for local chronic pain specialists and facilities that are more likely to provide you with long-term symptomatic improvement.  Additionally, I recommend you continue following up closely with your primary care provider and your psychiatric providers as I am concerned there may be some component of underlying psychiatric disease contributing to your symptoms.  In the interim, you may use Tylenol 650 mg 3 times daily.

## 2022-10-17 NOTE — ED Provider Notes (Signed)
[facility] Provider Note   CSN: [id number] Arrival date & time: 10/17/22  1435     History Chief Complaint  Patient presents with   Anxiety   Groin Pain    HPI Brad [name] is a 67 y.o. male presenting for chief complaint of diffuse body aches.  States that last night he noticed that his air conditioner was making a fog like substance that he is concerned was fiberglass.  He states that he has diffuse musculoskeletal aches. Chart review reveals extensive ED utilization for chronic pain diffusely. He endorses a diffuse nature of symptoms including nausea vomiting, poor p.o. intake, fatigue, groin pain, abdominal pain, back pain.  He endorses the symptoms have been present for years and he feels they are all slowly worsening. Patient states he is seeing a psychiatrist though he does not know if he has been taking his medications.  He denies suicidal homicidal ideation at this time.  States he is only here for his diffuse musculoskeletal pains.  Patient's recorded medical, surgical, social, medication list and allergies were reviewed in the Snapshot window as part of the initial history.   Review of Systems   Review of Systems  Constitutional:  Negative for chills and fever.  HENT:  Negative for ear pain and sore throat.   Eyes:  Negative for pain and visual disturbance.  Respiratory:  Negative for cough and shortness of breath.   Cardiovascular:  Negative for chest pain and palpitations.  Gastrointestinal:  Negative for abdominal pain and vomiting.  Genitourinary:  Negative for dysuria and hematuria.  Musculoskeletal:  Positive for arthralgias, back pain and myalgias.  Skin:  Negative for color change and rash.  Neurological:  Negative for seizures and syncope.  All other systems reviewed and are negative.   Physical Exam Updated Vital Signs BP (!) 148/101 (BP Location: Right Arm)   Pulse (!) 110   Temp 97.9 F (36.6 C) (Oral)    Resp 16   SpO2 99%  Physical Exam Vitals and nursing note reviewed.  Constitutional:      General: He is not in acute distress.    Appearance: He is well-developed.  HENT:     Head: Normocephalic and atraumatic.  Eyes:     Conjunctiva/sclera: Conjunctivae normal.  Cardiovascular:     Rate and Rhythm: Normal rate and regular rhythm.     Heart sounds: No murmur heard. Pulmonary:     Effort: Pulmonary effort is normal. No respiratory distress.     Breath sounds: Normal breath sounds.  Abdominal:     Palpations: Abdomen is soft.     Tenderness: There is no abdominal tenderness.  Musculoskeletal:        General: No swelling.     Cervical back: Neck supple.  Skin:    General: Skin is warm and dry.     Capillary Refill: Capillary refill takes less than 2 seconds.  Neurological:     Mental Status: He is alert.  Psychiatric:        Mood and Affect: Mood normal.      ED Course/ Medical Decision Making/ A&P    Procedures Procedures   Medications Ordered in ED Medications  acetaminophen (TYLENOL) tablet 1,000 mg (has no administration in time range)  ketorolac (TORADOL) 15 MG/ML injection 15 mg (has no administration in time range)    Medical Decision Making:    Brad [name] is a 67 y.o. male who presented to the ED today  with multiple complaints detailed above.     Patient's presentation is complicated by their history of a multitude of comorbid medical and psychiatric problems.  Patient placed on continuous vitals and telemetry monitoring while in ED which was reviewed periodically.   Complete initial physical exam performed, notably the patient  was hemodynamically stable in no acute distress.      Reviewed and confirmed nursing documentation for past medical history, family history, social history.    Initial Assessment:   Patient's history present on his physical exam findings do not reveal any acute pathology.  Differential is broad and includes viral myalgia,  metabolic disturbance, electrolyte abnormality, anemia, infection.  The seem grossly inconsistent with his description of the event last night.  His concern for potential fiberglass leaking from his air conditioning units appears to be a delusionInconsistent with any acute pathology based on normal respiratory function and patient's description of the event.  He is stating that he feels his breathing is fine but rather he is having diffuse musculoskeletal pain because of his inhalation of these fibers.  Chart review reveals that 2 weeks ago he had a similar episode where he had multiple complaints because the laundry company that helps him had put too much detergent in his close and it also caused musculoskeletal pain. Further review reveals he is return for abdominal pain and chest pain and back pain and musculoskeletal pains to multiple providers over the past year. Given this constellation of symptoms, I believe this is more representative of a somatoform disorder rather than any acute life or limb threatening pathology especially given diffuse nature and prolonged timeline of patient's symptoms.  I discussed this directly with the patient. I do not believe that patient would benefit from any further acute therapy in the emergency room.  Triage performed screening laboratory evaluation which was grossly reassuring once again.  Will treat patient with NSAIDs and Tylenol today for his continued musculoskeletal pains and recommended follow-up with PCP and provided patient resources for local pain providers for his chronic pain disorder.  Disposition:  I have considered need for hospitalization, however, considering all of the above, I believe this patient is stable for discharge at this time.  Patient/family educated about specific return precautions for given chief complaint and symptoms.  Patient/family educated about follow-up with PCP.     Patient/family expressed understanding of return precautions  and need for follow-up. Patient spoken to regarding all imaging and laboratory results and appropriate follow up for these results. All education provided in verbal form with additional information in written form. Time was allowed for answering of patient questions. Patient discharged.    Emergency Department Medication Summary:   Medications  acetaminophen (TYLENOL) tablet 1,000 mg (has no administration in time range)  ketorolac (TORADOL) 15 MG/ML injection 15 mg (has no administration in time range)    Clinical Impression:  1. Chronic pain syndrome      Discharge   Final Clinical Impression(s) / ED Diagnoses Final diagnoses:  Chronic pain syndrome    Rx / DC Orders ED Discharge Orders     None         Tretha Sciara, MD 10/17/22 1627

## 2022-10-17 NOTE — ED Notes (Signed)
Pt escorted out by security due to refusal to vacate.

## 2022-10-17 NOTE — ED Triage Notes (Signed)
Pt arrives via [facility] from home with multiple complaints. Pt is restless/anxious- states his HVAC at home is "blowing out fiberglass particles" into his lungs and causing generalized pain. Pt also c/o groin pain from a recent hernia repair sx., and c/o neck pain.

## 2022-10-17 NOTE — ED Notes (Signed)
While cleaning room, pt placed Tylenol tabs in water cup and did not take it.

## 2022-10-18 ENCOUNTER — Emergency Department (HOSPITAL_COMMUNITY)
Admission: EM | Admit: 2022-10-18 | Discharge: 2022-10-18 | Disposition: A | Discharge status: Home / Self Care | Payer: Medicare Other | Attending: Emergency Medicine | Admitting: Emergency Medicine

## 2022-10-18 DIAGNOSIS — R1032 Left lower quadrant pain: Secondary | ICD-10-CM

## 2022-10-18 MED ORDER — KETOROLAC TROMETHAMINE 15 MG/ML IJ SOLN
15.0000 mg | Freq: Once | INTRAMUSCULAR | Status: AC
  Administered 2022-10-18: 15 mg via INTRAMUSCULAR
  Filled 2022-10-18: qty 1

## 2022-10-18 NOTE — Discharge Instructions (Addendum)
It is very important for you to follow-up with the surgeon.  They will decide if any further procedures are necessary.

## 2022-10-18 NOTE — ED Triage Notes (Signed)
Pt is returns via EMS for re-evaluation for lower abdominal pain he states is secondary to "hernia pain." Hx Hernia repair.

## 2022-10-18 NOTE — ED Provider Notes (Signed)
[facility] Provider Note   CSN: [id number] Arrival date & time: 10/18/22  1953     History  Chief Complaint  Patient presents with   Abdominal Pain    Brad [name] is a 67 y.o. male presenting today with concern for left groin pain from previous hernia.  This has been an ongoing problem.  He says the pain is too severe to tolerate.  Said that he was supposed to have a follow-up with surgery but he missed their call.   Abdominal Pain Associated symptoms: no constipation, no diarrhea, no dysuria, no hematuria, no nausea and no vomiting        Home Medications Prior to Admission medications   Medication Sig Start Date End Date Taking? Authorizing Provider  Alpha-Lipoic Acid 600 MG CAPS Take 1 capsule by mouth daily.    [provider]  amLODipine (NORVASC) 5 MG tablet Take 2 tablets (10 mg total) by mouth daily. Patient taking differently: Take 5 mg by mouth daily. 03/02/19   Johnn Hai, MD  atorvastatin (LIPITOR) 10 MG tablet Take 10 mg by mouth 3 (three) times a week. MWF 08/10/19   [provider]  busPIRone (BUSPAR) 10 MG tablet Take 20 mg by mouth 3 (three) times daily.     [provider]  Cholecalciferol (VITAMIN D3) 125 MCG (5000 UT) TABS Take 1 tablet by mouth daily.    [provider]  Coenzyme Q10 50 MG CAPS Take 1 capsule by mouth daily.    [provider]  dicyclomine (BENTYL) 20 MG tablet Take 1 tablet (20 mg total) by mouth 3 (three) times daily as needed for spasms. 10/13/22   Long, Wonda Olds, MD  docusate sodium (COLACE) 100 MG capsule Take 1 capsule (100 mg total) by mouth every 12 (twelve) hours. 09/26/22   Valarie Merino, MD  LORazepam (ATIVAN) 1 MG tablet Take 1 mg by mouth 2 (two) times daily. 10/26/18   [provider]  losartan (COZAAR) 100 MG tablet Take 100 mg by mouth daily. 02/04/20   [provider]  memantine (NAMENDA) 10 MG tablet Take 1 tablet (10  mg total) by mouth 2 (two) times daily. Patient not taking: Reported on 08/20/2022 03/02/19   Johnn Hai, MD  Methylfol-Methylcob-Acetylcyst (CEREFOLIN NAC) 6-2-600 MG TABS 1 a day Patient not taking: Reported on 08/20/2022 03/02/19   Johnn Hai, MD  mirtazapine (REMERON) 30 MG tablet Take 30 mg by mouth at bedtime.    [provider]  Multiple Vitamin (MULTIVITAMIN) tablet Take 1 tablet by mouth daily.    [provider]  Omega-3 1000 MG CAPS Take 1 capsule by mouth daily.    [provider]  oxyCODONE (OXY IR/ROXICODONE) 5 MG immediate release tablet Take 1 tablet (5 mg total) by mouth every 6 (six) hours as needed for severe pain or breakthrough pain. Patient not taking: Reported on 09/30/2022 08/23/22   Michael Boston, MD  polyethylene glycol (MIRALAX) 17 g packet Take 17 g by mouth daily. 08/26/22   Rancour, Annie Main, MD  pregabalin (LYRICA) 75 MG capsule Take 75 mg by mouth 2 (two) times daily.    [provider]  Prenatal Vit-Fe Fumarate-FA (PRENATAL MULTIVITAMIN) TABS tablet Take 1 tablet by mouth daily. Patient not taking: Reported on 08/20/2022 03/03/19   Johnn Hai, MD  senna-docusate (SENOKOT-S) 8.6-50 MG tablet Take 1 tablet by mouth at bedtime as needed for mild constipation. 10/13/22   Long, Wonda Olds, MD  traZODone (DESYREL) 50 MG tablet Take 50 mg by mouth at bedtime.    [provider]  zaleplon (SONATA) 10 MG capsule Take 10 mg by mouth at bedtime as needed for sleep.    [provider]  zinc gluconate 50 MG tablet Take 50 mg by mouth daily.    [provider]  zolpidem (AMBIEN CR) 12.5 MG CR tablet Take 12.5 mg by mouth at bedtime.     [provider]      Allergies    Duloxetine hcl, Gabapentin, and Trileptal [oxcarbazepine]    Review of Systems   Review of Systems  Gastrointestinal:  Positive for abdominal pain. Negative for constipation, diarrhea, nausea and vomiting.  Genitourinary:  Positive for  testicular pain. Negative for dysuria and hematuria.    Physical Exam Updated Vital Signs BP (!) 139/103   Pulse 85   Temp 98.5 F (36.9 C)   Resp 16   SpO2 100%  Physical Exam Vitals and nursing note reviewed.  Constitutional:      Appearance: Normal appearance.  HENT:     Head: Normocephalic and atraumatic.  Eyes:     General: No scleral icterus.    Conjunctiva/sclera: Conjunctivae normal.  Pulmonary:     Effort: Pulmonary effort is normal. No respiratory distress.  Abdominal:     General: Abdomen is flat.     Palpations: Abdomen is soft.  Genitourinary:    Comments: GU exam performed in the hallway with curtain and nurse supervision.  Well-healed surgical scar to the left groin.  No abnormalities or palpated hernias Skin:    Findings: No rash.  Neurological:     Mental Status: He is alert.  Psychiatric:        Mood and Affect: Mood normal.     ED Results / Procedures / Treatments   Labs (all labs ordered are listed, but only abnormal results are displayed) Labs Reviewed - No data to display  EKG None  Radiology No results found.  Procedures Procedures   Medications Ordered in ED Medications  ketorolac (TORADOL) 15 MG/ML injection 15 mg (15 mg Intramuscular Given 10/18/22 2153)    ED Course/ Medical Decision Making/ A&P                             Medical Decision Making Risk Prescription drug management.   67 year old male presenting today with chronic left groin pain.  He had an inguinal hernia repair 2 years ago.  Since then he has had numerous emergency department visits complaining of pain.  Most recent visit was yesterday.  He had normal labs at that time.  He also had a CT earlier this week without abnormalities.    Considered lab work however do not believe that there is any indication with this being a chronic problem.  Also considered CT versus ultrasound imaging of the area however this was recently performed and physical exam is benign.   Do not believe additional imaging needs to be pursued.  Treatment: Toradol  MDM/disposition: 67 year old male who presented today with left groin pain.  Appears chronic.  We discussed that he has had numerous visits for this and he asked if he needs lab work.  I told him that he had normal labs yesterday and nothing is new and that we do not need to pursue labs.  CT abdomen pelvis and ultrasound were performed 5 days ago and were benign.  He says that he did  receive a call from surgery but he missed their call and believes he needs to be admitted today.  Vital signs are stable.  Abdominal exam is nonfocal and patient complains of pain in every part of his body.  I do suspect the patient has some psychiatric condition contributing to his ailment today however I do not believe any further workup needs to be done in the emergency department.  He was given a Toradol shot and given instructions to call surgery back first thing tomorrow morning.   Final Clinical Impression(s) / ED Diagnoses Final diagnoses:  Left groin pain    Rx / DC Orders ED Discharge Orders     None      Results and diagnoses were explained to the patient. Return precautions discussed in full. Patient had no additional questions and expressed complete understanding.   This chart was dictated using voice recognition software.  Despite best efforts to proofread,  errors can occur which can change the documentation meaning.    Rhae Hammock, PA-C 10/18/22 2211    Audley Hose, MD 10/19/22 [phone]

## 2022-10-19 ENCOUNTER — Observation Stay (HOSPITAL_COMMUNITY)
Admission: EM | Admit: 2022-10-19 | Discharge: 2022-10-21 | Disposition: A | Discharge status: Home / Self Care | Payer: Medicare Other | Attending: Internal Medicine | Admitting: Internal Medicine

## 2022-10-19 ENCOUNTER — Emergency Department (HOSPITAL_COMMUNITY)
Admission: EM | Admit: 2022-10-19 | Discharge: 2022-10-19 | Disposition: A | Discharge status: Home / Self Care | Payer: Medicare Other | Source: Home / Self Care | Attending: Emergency Medicine | Admitting: Emergency Medicine

## 2022-10-19 ENCOUNTER — Encounter (HOSPITAL_COMMUNITY): Payer: Self-pay

## 2022-10-19 ENCOUNTER — Other Ambulatory Visit: Payer: Self-pay

## 2022-10-19 ENCOUNTER — Emergency Department (HOSPITAL_COMMUNITY): Payer: Medicare Other

## 2022-10-19 DIAGNOSIS — E785 Hyperlipidemia, unspecified: Secondary | ICD-10-CM

## 2022-10-19 DIAGNOSIS — I10 Essential (primary) hypertension: Secondary | ICD-10-CM | POA: Insufficient documentation

## 2022-10-19 DIAGNOSIS — F450 Somatization disorder: Secondary | ICD-10-CM | POA: Insufficient documentation

## 2022-10-19 DIAGNOSIS — S40021A Contusion of right upper arm, initial encounter: Secondary | ICD-10-CM | POA: Insufficient documentation

## 2022-10-19 DIAGNOSIS — F411 Generalized anxiety disorder: Secondary | ICD-10-CM | POA: Diagnosis present

## 2022-10-19 DIAGNOSIS — Z79899 Other long term (current) drug therapy: Secondary | ICD-10-CM | POA: Diagnosis not present

## 2022-10-19 DIAGNOSIS — G6289 Other specified polyneuropathies: Secondary | ICD-10-CM

## 2022-10-19 DIAGNOSIS — X58XXXA Exposure to other specified factors, initial encounter: Secondary | ICD-10-CM | POA: Insufficient documentation

## 2022-10-19 DIAGNOSIS — G629 Polyneuropathy, unspecified: Secondary | ICD-10-CM | POA: Diagnosis not present

## 2022-10-19 DIAGNOSIS — R6889 Other general symptoms and signs: Secondary | ICD-10-CM

## 2022-10-19 DIAGNOSIS — R079 Chest pain, unspecified: Secondary | ICD-10-CM | POA: Diagnosis present

## 2022-10-19 DIAGNOSIS — F32A Depression, unspecified: Secondary | ICD-10-CM

## 2022-10-19 DIAGNOSIS — R0789 Other chest pain: Secondary | ICD-10-CM | POA: Diagnosis present

## 2022-10-19 DIAGNOSIS — S40022A Contusion of left upper arm, initial encounter: Secondary | ICD-10-CM | POA: Insufficient documentation

## 2022-10-19 DIAGNOSIS — R251 Tremor, unspecified: Secondary | ICD-10-CM

## 2022-10-19 LAB — CBC WITH DIFFERENTIAL/PLATELET
Abs Immature Granulocytes: 0.03 10*3/uL (ref 0.00–0.07)
Basophils Absolute: 0 10*3/uL (ref 0.0–0.1)
Basophils Relative: 0 %
Eosinophils Absolute: 0 10*3/uL (ref 0.0–0.5)
Eosinophils Relative: 0 %
HCT: 42.7 % (ref 39.0–52.0)
Hemoglobin: 15.1 g/dL (ref 13.0–17.0)
Immature Granulocytes: 0 %
Lymphocytes Relative: 22 %
Lymphs Abs: 1.9 10*3/uL (ref 0.7–4.0)
MCH: 31.7 pg (ref 26.0–34.0)
MCHC: 35.4 g/dL (ref 30.0–36.0)
MCV: 89.7 fL (ref 80.0–100.0)
Monocytes Absolute: 1 10*3/uL (ref 0.1–1.0)
Monocytes Relative: 11 %
Neutro Abs: 5.9 10*3/uL (ref 1.7–7.7)
Neutrophils Relative %: 67 %
Platelets: 211 10*3/uL (ref 150–400)
RBC: 4.76 MIL/uL (ref 4.22–5.81)
RDW: 12.5 % (ref 11.5–15.5)
WBC: 8.9 10*3/uL (ref 4.0–10.5)
nRBC: 0 % (ref 0.0–0.2)

## 2022-10-19 LAB — BASIC METABOLIC PANEL
Anion gap: 10 (ref 5–15)
BUN: 22 mg/dL (ref 8–23)
CO2: 22 mmol/L (ref 22–32)
Calcium: 9.5 mg/dL (ref 8.9–10.3)
Chloride: 95 mmol/L — ABNORMAL LOW (ref 98–111)
Creatinine, Ser: 0.91 mg/dL (ref 0.61–1.24)
GFR, Estimated: >60 mL/min (ref >60)
Glucose, Bld: 111 mg/dL — ABNORMAL HIGH (ref 70–99)
Potassium: 3.7 mmol/L (ref 3.5–5.1)
Sodium: 127 mmol/L — ABNORMAL LOW (ref 135–145)

## 2022-10-19 LAB — D-DIMER, QUANTITATIVE: D-Dimer, Quant: 1.54 ug/mL-FEU — ABNORMAL HIGH (ref 0.00–0.50)

## 2022-10-19 LAB — TROPONIN I (HIGH SENSITIVITY)
Troponin I (High Sensitivity): 22 ng/L — ABNORMAL HIGH (ref <18)
Troponin I (High Sensitivity): 29 ng/L — ABNORMAL HIGH (ref <18)

## 2022-10-19 MED ORDER — ACETAMINOPHEN 500 MG PO TABS
1000.0000 mg | ORAL_TABLET | Freq: Once | ORAL | Status: AC
  Administered 2022-10-19: 1000 mg via ORAL
  Filled 2022-10-19: qty 2

## 2022-10-19 MED ORDER — LIDOCAINE VISCOUS HCL 2 % MT SOLN
15.0000 mL | Freq: Once | OROMUCOSAL | Status: AC
  Administered 2022-10-19: 15 mL via ORAL
  Filled 2022-10-19: qty 15

## 2022-10-19 MED ORDER — ALUM & MAG HYDROXIDE-SIMETH 200-200-20 MG/5ML PO SUSP
30.0000 mL | Freq: Once | ORAL | Status: AC
  Administered 2022-10-19: 30 mL via ORAL
  Filled 2022-10-19: qty 30

## 2022-10-19 MED ORDER — SODIUM CHLORIDE 0.9 % IV BOLUS
1000.0000 mL | Freq: Once | INTRAVENOUS | Status: AC
  Administered 2022-10-20: 1000 mL via INTRAVENOUS

## 2022-10-19 MED ORDER — SODIUM CHLORIDE (PF) 0.9 % IJ SOLN
INTRAMUSCULAR | Status: AC
  Filled 2022-10-19: qty 50

## 2022-10-19 MED ORDER — SODIUM CHLORIDE 0.9 % IV BOLUS
1000.0000 mL | Freq: Once | INTRAVENOUS | Status: AC
  Administered 2022-10-19: 1000 mL via INTRAVENOUS

## 2022-10-19 NOTE — ED Triage Notes (Signed)
BIB EMS due to something feels unusual in his chest. Pt has been seen multiple times in last few days. "Thumbs are burning from pulling his pants Up" 154/90-100-24-100%

## 2022-10-19 NOTE — ED Triage Notes (Signed)
Pt BIB EMS from home with anxiety. Pt was recently discharged for his hernia pain and groin pain.

## 2022-10-19 NOTE — Discharge Instructions (Signed)
Please return for emergencies

## 2022-10-19 NOTE — Discharge Instructions (Addendum)
We evaluated you for your chest pain.  Your EKG was reassuring.  Your cardiac enzymes were a tiny bit elevated so we would like you to follow-up with cardiology.  We have placed a referral, they should call you for follow-up.  If your symptoms worsen or you have recurrent symptoms, worsening chest pain, difficulty breathing, nausea or vomiting, sweating, fainting, lightheadedness or dizziness, please return immediately to the emergency department.

## 2022-10-19 NOTE — ED Notes (Signed)
Pt took 1/2 to 3/4 of meds and refused the rest. Said he did not need them

## 2022-10-19 NOTE — ED Provider Notes (Signed)
[facility] AT [facility] Provider Note   CSN: [id number] Arrival date & time: 10/19/22  0113     History  Chief Complaint  Patient presents with   Anxiety    Brad [name] is a 67 y.o. male. With past medical history of anxiety, chronic constipation, chronic pain, hypertension, who presents for anxiety.  Patient states that he is in fact not here for anxiety.  He states that he is having pain in the left inguinal hernia.  Discussed that he was here a few hours ago for the same complaint and asked if there has been any change and he replies no.  He then moves on to say that he is in "pain all over."  He also then points out some bruises on his arms which appear to be healing and states that these are abscesses that need to be checked out.  Notably, patient was seen here just a few hours ago and evaluated for left groin pain from previous hernia. Has also been seen 9 times this month for various complaints.     Anxiety       Home Medications Prior to Admission medications   Medication Sig Start Date End Date Taking? Authorizing Provider  Alpha-Lipoic Acid 600 MG CAPS Take 1 capsule by mouth daily.    [provider]  amLODipine (NORVASC) 5 MG tablet Take 2 tablets (10 mg total) by mouth daily. Patient taking differently: Take 5 mg by mouth daily. 03/02/19   Johnn Hai, MD  atorvastatin (LIPITOR) 10 MG tablet Take 10 mg by mouth 3 (three) times a week. MWF 08/10/19   [provider]  busPIRone (BUSPAR) 10 MG tablet Take 20 mg by mouth 3 (three) times daily.     [provider]  Cholecalciferol (VITAMIN D3) 125 MCG (5000 UT) TABS Take 1 tablet by mouth daily.    [provider]  Coenzyme Q10 50 MG CAPS Take 1 capsule by mouth daily.    [provider]  dicyclomine (BENTYL) 20 MG tablet Take 1 tablet (20 mg total) by mouth 3 (three) times daily as needed for spasms. 10/13/22   Long, Wonda Olds, MD  docusate  sodium (COLACE) 100 MG capsule Take 1 capsule (100 mg total) by mouth every 12 (twelve) hours. 09/26/22   Valarie Merino, MD  LORazepam (ATIVAN) 1 MG tablet Take 1 mg by mouth 2 (two) times daily. 10/26/18   [provider]  losartan (COZAAR) 100 MG tablet Take 100 mg by mouth daily. 02/04/20   [provider]  memantine (NAMENDA) 10 MG tablet Take 1 tablet (10 mg total) by mouth 2 (two) times daily. Patient not taking: Reported on 08/20/2022 03/02/19   Johnn Hai, MD  Methylfol-Methylcob-Acetylcyst (CEREFOLIN NAC) 6-2-600 MG TABS 1 a day Patient not taking: Reported on 08/20/2022 03/02/19   Johnn Hai, MD  mirtazapine (REMERON) 30 MG tablet Take 30 mg by mouth at bedtime.    [provider]  Multiple Vitamin (MULTIVITAMIN) tablet Take 1 tablet by mouth daily.    [provider]  Omega-3 1000 MG CAPS Take 1 capsule by mouth daily.    [provider]  oxyCODONE (OXY IR/ROXICODONE) 5 MG immediate release tablet Take 1 tablet (5 mg total) by mouth every 6 (six) hours as needed for severe pain or breakthrough pain. Patient not taking: Reported on 09/30/2022 08/23/22   Michael Boston, MD  polyethylene glycol (MIRALAX) 17 g packet Take 17 g by mouth daily.  08/26/22   Rancour, Annie Main, MD  pregabalin (LYRICA) 75 MG capsule Take 75 mg by mouth 2 (two) times daily.    [provider]  Prenatal Vit-Fe Fumarate-FA (PRENATAL MULTIVITAMIN) TABS tablet Take 1 tablet by mouth daily. Patient not taking: Reported on 08/20/2022 03/03/19   Johnn Hai, MD  senna-docusate (SENOKOT-S) 8.6-50 MG tablet Take 1 tablet by mouth at bedtime as needed for mild constipation. 10/13/22   Long, Wonda Olds, MD  traZODone (DESYREL) 50 MG tablet Take 50 mg by mouth at bedtime.    [provider]  zaleplon (SONATA) 10 MG capsule Take 10 mg by mouth at bedtime as needed for sleep.    [provider]  zinc gluconate 50 MG tablet Take 50 mg by mouth daily.    [provider]  zolpidem (AMBIEN CR) 12.5 MG CR tablet Take 12.5 mg by mouth at bedtime.     [provider]      Allergies    Duloxetine hcl, Gabapentin, and Trileptal [oxcarbazepine]    Review of Systems   Review of Systems  Skin:  Positive for wound.  Psychiatric/Behavioral:  The patient is nervous/anxious.   All other systems reviewed and are negative.   Physical Exam Updated Vital Signs BP (!) 162/106 Comment: Pt would not stay still  Pulse (!) 2   Temp 98.5 F (36.9 C) (Oral)   Resp 18   SpO2 98%  Physical Exam Vitals and nursing note reviewed. Exam conducted with a chaperone present.  Constitutional:      Comments: Chronically ill-appearing, thin, somewhat restless  HENT:     Head: Normocephalic and atraumatic.  Eyes:     General: No scleral icterus. Cardiovascular:     Pulses: Normal pulses.  Pulmonary:     Effort: Pulmonary effort is normal. No respiratory distress.  Abdominal:     General: Abdomen is flat.     Comments: Left inguinal hernia repair scar is well-healing.  There is no obvious hernia present requiring reduction or evidence of incarceration or strangulation.  Musculoskeletal:        General: Normal range of motion.  Skin:    General: Skin is warm and dry.     Capillary Refill: Capillary refill takes less than 2 seconds.     Findings: Bruising present. No rash.  Neurological:     General: No focal deficit present.     Mental Status: He is alert.  Psychiatric:        Mood and Affect: Mood normal.        Behavior: Behavior normal.        Thought Content: Thought content normal.        Judgment: Judgment normal.     ED Results / Procedures / Treatments   Labs (all labs ordered are listed, but only abnormal results are displayed) Labs Reviewed - No data to display  EKG None  Radiology No results found.  Procedures Procedures   Medications Ordered in ED Medications  acetaminophen (TYLENOL) tablet 1,000 mg (1,000 mg Oral  Given 10/19/22 0319)    ED Course/ Medical Decision Making/ A&P  Medical Decision Making Risk OTC drugs.  Initial Impression and Ddx 67 year old male who presents for anxiety Patient PMH that increases complexity of ED encounter:  anxiety, chronic pain, hypertension Differential: anxiety, thyroid disorder, other undiagnosed psychiatric illness, malingering, etc.   Interpretation of Diagnostics I independent reviewed and interpreted the labs as followed: Not indicated  - I independently visualized the following imaging  with scope of interpretation limited to determining acute life threatening conditions related to emergency care: Not indicated  Patient Reassessment and Ultimate Disposition/Management 67 year old male who presents to the emergency department for multiple complaints. He was just seen here a few hours ago and presents again with multiple different complaints. He initially states that he is here for anxiety to the nursing staff but when I ask about this he denies being anxious.  He then discusses that he is having pain to the left groin where he has a hernia.  I evaluated this at bedside and did not find any evidence of a irreducible left inguinal hernia.  He then changes the subject and discusses that he is generally in pain.  We discussed that I am unlikely to resolve this issue in the emergency department today.  He then changes the subject again saying that he has multiple bruises on his arms that are abscesses that need to be evaluated. He does have multiple bruises which I presume from previous blood draws on his forearms.  There are no abscesses present. I do believe that patient is either malingering or has an underlying psychiatric disorder, somatic disorder which is why he has presented. He has been seen 9 times this month for various complaints.  He has had multiple workups and imaging and I do not feel that he needs further workup or imaging at this time. I have given  him some Tylenol for pain.  He will be discharged.  The patient has been appropriately medically screened and/or stabilized in the ED. I have low suspicion for any other emergent medical condition which would require further screening, evaluation or treatment in the ED or require inpatient management. At time of discharge the patient is hemodynamically stable and in no acute distress. I have discussed work-up results and diagnosis with patient and answered all questions. Patient is agreeable with discharge plan. We discussed strict return precautions for returning to the emergency department and they verbalized understanding.     Patient management required discussion with the following services or consulting groups:  None  Complexity of Problems Addressed Chronic illness with exacerbation  Additional Data Reviewed and Analyzed Further history obtained from: Past medical history and medications listed in the EMR, Prior ED visit notes, Care Everywhere, and Prior labs/imaging results  Patient Encounter Risk Assessment SDOH impact on management  Final Clinical Impression(s) / ED Diagnoses Final diagnoses:  Multiple somatic complaints    Rx / DC Orders ED Discharge Orders          Ordered    Ambulatory referral to Pain Clinic        10/19/22 0316              Mickie Hillier, PA-C 10/19/22 0407    Molpus, Jenny Reichmann, MD 10/19/22 [phone]

## 2022-10-19 NOTE — ED Provider Notes (Signed)
[facility] Provider Note  CSN: [id number] Arrival date & time: 10/19/22 1721  Chief Complaint(s) Something feels different in his chest  HPI CADYN SHADOWENS is a 67 y.o. male with a history of anxiety, hypertension, multiple ER visits for a variety of complaints presenting to the emergency department with chest pain.  He reports that the chest pain began this morning.  He relates this to "fiberglass coming from my chimney".  He reports that it is also associated with a scratchy sensation in his throat.  He reports it is a burning sensation, located substernally.  Does not radiate.  No shortness of breath.  Pain is not pleuritic.  Denies any recent travel or surgeries.  Pain is not exertional.  He denies any nausea or vomiting.  He denies any syncope, lightheadedness, palpitations.  He reports his symptoms are constant.  Denies similar episodes before.   Past Medical History Past Medical History:  Diagnosis Date   Allergy    Anxiety    Depression    DJD (degenerative joint disease), cervical    postiton with pillow under knees, cant turn neck    Dysentery, amebic, acute 1981   GERD (gastroesophageal reflux disease)    H/O bronchitis    H/O malaria 1984   Hearing loss    bilateral    Hypertension    labile Blood pressure   Inguinal hernia    Insomnia    early morning awakening   MVP (mitral valve prolapse)    "no problems"   Perianal pain    Personal history of colonic polyps    Tinnitus    right ear   Patient Active Problem List   Diagnosis Date Noted   Anxiety state 08/23/2022   Constipation, chronic 08/23/2022   Incarcerated left inguinal hernia 08/20/2022   Low back pain 03/08/2022   Neuropathic pain 08/07/2019   MDD (major depressive disorder) 02/27/2019   Paresthesias 06/28/2018   Inguinal hernia, left-repair [facility] Dec 2012 09/10/2011   Home Medication(s) Prior to Admission medications   Medication Sig Start Date End Date  Taking? Authorizing Provider  Alpha-Lipoic Acid 600 MG CAPS Take 1 capsule by mouth daily.    [provider]  amLODipine (NORVASC) 5 MG tablet Take 2 tablets (10 mg total) by mouth daily. Patient taking differently: Take 5 mg by mouth daily. 03/02/19   Johnn Hai, MD  atorvastatin (LIPITOR) 10 MG tablet Take 10 mg by mouth 3 (three) times a week. MWF 08/10/19   [provider]  busPIRone (BUSPAR) 10 MG tablet Take 20 mg by mouth 3 (three) times daily.     [provider]  Cholecalciferol (VITAMIN D3) 125 MCG (5000 UT) TABS Take 1 tablet by mouth daily.    [provider]  Coenzyme Q10 50 MG CAPS Take 1 capsule by mouth daily.    [provider]  dicyclomine (BENTYL) 20 MG tablet Take 1 tablet (20 mg total) by mouth 3 (three) times daily as needed for spasms. 10/13/22   Long, Wonda Olds, MD  docusate sodium (COLACE) 100 MG capsule Take 1 capsule (100 mg total) by mouth every 12 (twelve) hours. 09/26/22   Valarie Merino, MD  LORazepam (ATIVAN) 1 MG tablet Take 1 mg by mouth 2 (two) times daily. 10/26/18   [provider]  losartan (COZAAR) 100 MG tablet Take 100 mg by mouth daily. 02/04/20   [provider]  memantine (NAMENDA) 10 MG tablet Take 1 tablet (  10 mg total) by mouth 2 (two) times daily. Patient not taking: Reported on 08/20/2022 03/02/19   Johnn Hai, MD  Methylfol-Methylcob-Acetylcyst (CEREFOLIN NAC) 6-2-600 MG TABS 1 a day Patient not taking: Reported on 08/20/2022 03/02/19   Johnn Hai, MD  mirtazapine (REMERON) 30 MG tablet Take 30 mg by mouth at bedtime.    [provider]  Multiple Vitamin (MULTIVITAMIN) tablet Take 1 tablet by mouth daily.    [provider]  Omega-3 1000 MG CAPS Take 1 capsule by mouth daily.    [provider]  oxyCODONE (OXY IR/ROXICODONE) 5 MG immediate release tablet Take 1 tablet (5 mg total) by mouth every 6 (six) hours as needed for severe pain or breakthrough  pain. Patient not taking: Reported on 09/30/2022 08/23/22   Michael Boston, MD  polyethylene glycol (MIRALAX) 17 g packet Take 17 g by mouth daily. 08/26/22   Rancour, Annie Main, MD  pregabalin (LYRICA) 75 MG capsule Take 75 mg by mouth 2 (two) times daily.    [provider]  Prenatal Vit-Fe Fumarate-FA (PRENATAL MULTIVITAMIN) TABS tablet Take 1 tablet by mouth daily. Patient not taking: Reported on 08/20/2022 03/03/19   Johnn Hai, MD  senna-docusate (SENOKOT-S) 8.6-50 MG tablet Take 1 tablet by mouth at bedtime as needed for mild constipation. 10/13/22   Long, Wonda Olds, MD  traZODone (DESYREL) 50 MG tablet Take 50 mg by mouth at bedtime.    [provider]  zaleplon (SONATA) 10 MG capsule Take 10 mg by mouth at bedtime as needed for sleep.    [provider]  zinc gluconate 50 MG tablet Take 50 mg by mouth daily.    [provider]  zolpidem (AMBIEN CR) 12.5 MG CR tablet Take 12.5 mg by mouth at bedtime.     [provider]                                                                                                                                    Past Surgical History Past Surgical History:  Procedure Laterality Date   CARPAL TUNNEL RELEASE  10/06, 5/10   right wrist    CARPAL TUNNEL RELEASE  3/10   left wrist    cervical spine discectomy   09/2005   COLONOSCOPY WITH PROPOFOL N/A 10/21/2015   Procedure: COLONOSCOPY WITH PROPOFOL;  Surgeon: [name], MD;  Location: [facility];  Service: Endoscopy;  Laterality: N/A;   INGUINAL HERNIA REPAIR  08/16/2011   Procedure: HERNIA REPAIR INGUINAL ADULT;  Surgeon: [name], MD;  Location: [facility];  Service: General;  Laterality: Left;   INGUINAL HERNIA REPAIR Right 05/03/2014   Procedure: OPEN RIGHT INGUINAL HERNIA REPAIR WITH MESH;  Surgeon: [name], MD;  Location: [facility];  Service: General;  Laterality: Right;   INGUINAL HERNIA REPAIR Left 08/20/2022   Procedure:  HERNIA REPAIR INGUINAL ADULT;  Surgeon: [name], MD;  Location:  [facility];  Service: General;  Laterality: Left;   INSERTION OF MESH Right 05/03/2014   Procedure: INSERTION OF MESH;  Surgeon: [name], MD;  Location: [facility];  Service: General;  Laterality: Right;   TONSILLECTOMY  1960   Family History Family History  Problem Relation Age of Onset   Cancer Mother        breast   Intracerebral hemorrhage Father     Social History Social History   Tobacco Use   Smoking status: Never   Smokeless tobacco: Never  Vaping Use   Vaping Use: Never used  Substance Use Topics   Alcohol use: Yes    Comment: 2 wine daily   Drug use: Yes    Types: Marijuana    Comment: weekend use   Allergies Duloxetine hcl, Gabapentin, and Trileptal [oxcarbazepine]  Review of Systems Review of Systems  All other systems reviewed and are negative.   Physical Exam Vital Signs  I have reviewed the triage vital signs BP (!) 181/103   Pulse 83   Temp 98.3 F (36.8 C) (Oral)   Resp 19   Ht 5' 9"$  (1.753 m)   Wt 60 kg   SpO2 100%   BMI 19.53 kg/m  Physical Exam Vitals and nursing note reviewed.  Constitutional:      General: He is not in acute distress.    Appearance: Normal appearance.  HENT:     Mouth/Throat:     Mouth: Mucous membranes are moist.  Eyes:     Conjunctiva/sclera: Conjunctivae normal.  Cardiovascular:     Rate and Rhythm: Normal rate and regular rhythm.  Pulmonary:     Effort: Pulmonary effort is normal. No respiratory distress.     Breath sounds: Normal breath sounds.  Abdominal:     General: Abdomen is flat.     Palpations: Abdomen is soft.     Tenderness: There is no abdominal tenderness.  Musculoskeletal:     Right lower leg: No edema.     Left lower leg: No edema.  Skin:    General: Skin is warm and dry.     Capillary Refill: Capillary refill takes less than 2 seconds.  Neurological:     Mental Status: He is alert and oriented to person, place, and time.  Mental status is at baseline.  Psychiatric:        Mood and Affect: Mood is anxious.        Behavior: Behavior normal.     ED Results and Treatments Labs (all labs ordered are listed, but only abnormal results are displayed) Labs Reviewed  TROPONIN I (HIGH SENSITIVITY) - Abnormal; Notable for the following components:      Result Value   Troponin I (High Sensitivity) 22 (*)    All other components within normal limits  TROPONIN I (HIGH SENSITIVITY) - Abnormal; Notable for the following components:   Troponin I (High Sensitivity) 29 (*)    All other components within normal limits  CBC WITH DIFFERENTIAL/PLATELET  BASIC METABOLIC PANEL  D-DIMER, QUANTITATIVE  Radiology DG CHEST PORT 1 VIEW  Result Date: 10/19/2022 CLINICAL DATA:  Chest pain EXAM: PORTABLE CHEST 1 VIEW COMPARISON:  10/11/2022 FINDINGS: No acute airspace disease or effusion. Normal cardiomediastinal silhouette. No pneumothorax. Air distended bowel beneath the right hemidiaphragm. IMPRESSION: No active disease. Air distended bowel beneath the right hemidiaphragm. Electronically Signed   By: Donavan Foil M.D.   On: 10/19/2022 18:06    Pertinent labs & imaging results that were available during my care of the patient were reviewed by me and considered in my medical decision making (see MDM for details).  Medications Ordered in ED Medications  sodium chloride 0.9 % bolus 1,000 mL (has no administration in time range)  alum & mag hydroxide-simeth (MAALOX/MYLANTA) 200-200-20 MG/5ML suspension 30 mL (30 mLs Oral Given 10/19/22 1906)    And  lidocaine (XYLOCAINE) 2 % viscous mouth solution 15 mL (15 mLs Oral Given 10/19/22 1906)  acetaminophen (TYLENOL) tablet 1,000 mg (1,000 mg Oral Given 10/19/22 1905)                                                                                                                                      Procedures Procedures  (including critical care time)  Medical Decision Making / ED Course   MDM:  67 year old male presenting to the emergency department with chest pain.  Patient is well-appearing although anxious appearing.  He has been seen in the emergency department many times this months for variety of somatic complaints.  Low concern for ACS, history atypical.  Will check single troponin and EKG.  Doubt pulmonary embolism, no recent travel, surgeries, pain is not pleuritic, no hypoxia.  Doubt dissection, pain does not radiate, pain mild, burning in quality.  Will check chest x-ray but low concern for pneumothorax, pneumonia, lungs clear on exam.  Could be GERD related, will trial GI cocktail.  If troponin is negative, EKG and chest x-ray unremarkable, likely discharge.  Clinical Course as of 10/19/22 2317  Tue Oct 19, 2022  2228 Patient did have mildly elevated troponin on initial check.  On recheck, it did increase by 7.  Patient's history is still extremely atypical for ACS but his symptoms not improved with GI cocktail, he has a heart score of 5 given his comorbidities and baseline abnormal EKG with fascicular block.  Will check basic labs to evaluate for dehydration as a cause of elevated troponin or anemia.  Patient may need to be admitted for stress test or cardiac CTA. [WS]  2316 Signed out to Dr. Tyrone Nine pending basic labs, likely admission for cardiac evaluation for low risk chest pain.  [WS]    Clinical Course User Index [WS] Cristie Hem, MD     Additional history obtained: -Additional history obtained from ems -External records from outside source obtained and reviewed including: Chart review including previous notes, labs, imaging, consultation notes including previous ER visits for non-specific complaints   Lab  Tests: -I ordered, reviewed, and interpreted labs.   The pertinent results include:   Labs Reviewed  TROPONIN I (HIGH  SENSITIVITY) - Abnormal; Notable for the following components:      Result Value   Troponin I (High Sensitivity) 22 (*)    All other components within normal limits  TROPONIN I (HIGH SENSITIVITY) - Abnormal; Notable for the following components:   Troponin I (High Sensitivity) 29 (*)    All other components within normal limits  CBC WITH DIFFERENTIAL/PLATELET  BASIC METABOLIC PANEL  D-DIMER, QUANTITATIVE    Notable for elevated troponin  EKG   EKG Interpretation  Date/Time:  Tuesday October 19 2022 18:40:27 EST Ventricular Rate:  97 PR Interval:  197 QRS Duration: 94 QT Interval:  360 QTC Calculation: 458 R Axis:   -73 Text Interpretation: Sinus rhythm Left anterior fascicular block Consider right ventricular hypertrophy No significant change since last tracing Confirmed by Garnette Gunner [phone]) on 10/19/2022 6:46:12 PM         Imaging Studies ordered: I ordered imaging studies including CXR On my interpretation imaging demonstrates no acute process I independently visualized and interpreted imaging. I agree with the radiologist interpretation   Medicines ordered and prescription drug management: Meds ordered this encounter  Medications   AND Linked Order Group    alum & mag hydroxide-simeth (MAALOX/MYLANTA) 200-200-20 MG/5ML suspension 30 mL    lidocaine (XYLOCAINE) 2 % viscous mouth solution 15 mL   acetaminophen (TYLENOL) tablet 1,000 mg   sodium chloride 0.9 % bolus 1,000 mL    -I have reviewed the patients home medicines and have made adjustments as needed   Cardiac Monitoring: The patient was maintained on a cardiac monitor.  I personally viewed and interpreted the cardiac monitored which showed an underlying rhythm of: NSR  Social Determinants of Health:  Diagnosis or treatment significantly limited by social determinants of health: lives alone   Reevaluation: After the interventions noted above, I reevaluated the patient and found that they have  stayed the same  Co morbidities that complicate the patient evaluation  Past Medical History:  Diagnosis Date   Allergy    Anxiety    Depression    DJD (degenerative joint disease), cervical    postiton with pillow under knees, cant turn neck    Dysentery, amebic, acute 1981   GERD (gastroesophageal reflux disease)    H/O bronchitis    H/O malaria 1984   Hearing loss    bilateral    Hypertension    labile Blood pressure   Inguinal hernia    Insomnia    early morning awakening   MVP (mitral valve prolapse)    "no problems"   Perianal pain    Personal history of colonic polyps    Tinnitus    right ear      Dispostion: Disposition decision including need for hospitalization was considered, and patient disposition pending at time of sign out.     Final Clinical Impression(s) / ED Diagnoses Final diagnoses:  Atypical chest pain     This chart was dictated using voice recognition software.  Despite best efforts to proofread,  errors can occur which can change the documentation meaning.    Cristie Hem, MD 10/19/22 [phone]

## 2022-10-20 ENCOUNTER — Observation Stay (HOSPITAL_BASED_OUTPATIENT_CLINIC_OR_DEPARTMENT_OTHER): Payer: Medicare Other

## 2022-10-20 ENCOUNTER — Emergency Department (HOSPITAL_COMMUNITY): Payer: Medicare Other

## 2022-10-20 ENCOUNTER — Encounter (HOSPITAL_COMMUNITY): Payer: Self-pay | Admitting: Family Medicine

## 2022-10-20 ENCOUNTER — Observation Stay (HOSPITAL_COMMUNITY): Payer: Medicare Other

## 2022-10-20 DIAGNOSIS — F419 Anxiety disorder, unspecified: Secondary | ICD-10-CM | POA: Diagnosis not present

## 2022-10-20 DIAGNOSIS — F32A Depression, unspecified: Secondary | ICD-10-CM

## 2022-10-20 DIAGNOSIS — G629 Polyneuropathy, unspecified: Secondary | ICD-10-CM

## 2022-10-20 DIAGNOSIS — R52 Pain, unspecified: Secondary | ICD-10-CM

## 2022-10-20 DIAGNOSIS — R079 Chest pain, unspecified: Secondary | ICD-10-CM

## 2022-10-20 DIAGNOSIS — R0600 Dyspnea, unspecified: Secondary | ICD-10-CM

## 2022-10-20 DIAGNOSIS — E785 Hyperlipidemia, unspecified: Secondary | ICD-10-CM

## 2022-10-20 DIAGNOSIS — I10 Essential (primary) hypertension: Secondary | ICD-10-CM | POA: Diagnosis not present

## 2022-10-20 DIAGNOSIS — R0789 Other chest pain: Secondary | ICD-10-CM | POA: Diagnosis not present

## 2022-10-20 DIAGNOSIS — R6889 Other general symptoms and signs: Secondary | ICD-10-CM

## 2022-10-20 DIAGNOSIS — R072 Precordial pain: Secondary | ICD-10-CM | POA: Diagnosis not present

## 2022-10-20 DIAGNOSIS — F411 Generalized anxiety disorder: Secondary | ICD-10-CM

## 2022-10-20 LAB — ECHOCARDIOGRAM COMPLETE
Height: 69 in
S' Lateral: 3.2 cm
Weight: 2116.42 oz

## 2022-10-20 MED ORDER — VITAMIN D 25 MCG (1000 UNIT) PO TABS
5000.0000 [IU] | ORAL_TABLET | Freq: Every day | ORAL | Status: DC
  Administered 2022-10-20 – 2022-10-21 (×2): 5000 [IU] via ORAL
  Filled 2022-10-20 (×2): qty 5

## 2022-10-20 MED ORDER — ALPRAZOLAM 0.25 MG PO TABS: 0.2500 mg | ORAL_TABLET | Freq: Two times a day (BID) | ORAL | Status: DC | PRN

## 2022-10-20 MED ORDER — ZOLPIDEM TARTRATE 5 MG PO TABS
5.0000 mg | ORAL_TABLET | Freq: Every evening | ORAL | Status: DC | PRN
  Administered 2022-10-20 (×2): 5 mg via ORAL
  Filled 2022-10-20 (×2): qty 1

## 2022-10-20 MED ORDER — LORAZEPAM 1 MG PO TABS
1.0000 mg | ORAL_TABLET | Freq: Two times a day (BID) | ORAL | Status: DC
  Administered 2022-10-20 – 2022-10-21 (×3): 1 mg via ORAL
  Filled 2022-10-20 (×3): qty 1

## 2022-10-20 MED ORDER — SODIUM CHLORIDE 0.9 % IV SOLN: INTRAVENOUS | Status: DC

## 2022-10-20 MED ORDER — SENNOSIDES-DOCUSATE SODIUM 8.6-50 MG PO TABS: 1.0000 | ORAL_TABLET | Freq: Every evening | ORAL | Status: DC | PRN

## 2022-10-20 MED ORDER — BUDESONIDE 0.5 MG/2ML IN SUSP
0.5000 mg | Freq: Two times a day (BID) | RESPIRATORY_TRACT | Status: DC
  Administered 2022-10-20 – 2022-10-21 (×2): 0.5 mg via RESPIRATORY_TRACT
  Filled 2022-10-20 (×2): qty 2

## 2022-10-20 MED ORDER — DICYCLOMINE HCL 20 MG PO TABS: 20.0000 mg | ORAL_TABLET | Freq: Three times a day (TID) | ORAL | Status: DC | PRN

## 2022-10-20 MED ORDER — ZINC SULFATE 220 (50 ZN) MG PO CAPS
220.0000 mg | ORAL_CAPSULE | Freq: Every day | ORAL | Status: DC
  Administered 2022-10-20 – 2022-10-21 (×2): 220 mg via ORAL
  Filled 2022-10-20 (×2): qty 1

## 2022-10-20 MED ORDER — NITROGLYCERIN 0.4 MG SL SUBL: 0.4000 mg | SUBLINGUAL_TABLET | SUBLINGUAL | Status: DC | PRN

## 2022-10-20 MED ORDER — ALPRAZOLAM 0.5 MG PO TABS: 1.0000 mg | ORAL_TABLET | Freq: Two times a day (BID) | ORAL | Status: DC | PRN

## 2022-10-20 MED ORDER — LOSARTAN POTASSIUM 50 MG PO TABS
100.0000 mg | ORAL_TABLET | Freq: Every day | ORAL | Status: DC
  Administered 2022-10-20 – 2022-10-21 (×2): 100 mg via ORAL
  Filled 2022-10-20 (×2): qty 2

## 2022-10-20 MED ORDER — MAGNESIUM HYDROXIDE 400 MG/5ML PO SUSP: 30.0000 mL | Freq: Every day | ORAL | Status: DC | PRN

## 2022-10-20 MED ORDER — MIRTAZAPINE 15 MG PO TABS
45.0000 mg | ORAL_TABLET | Freq: Every day | ORAL | Status: DC
  Administered 2022-10-20: 45 mg via ORAL
  Filled 2022-10-20: qty 3

## 2022-10-20 MED ORDER — TRAZODONE HCL 50 MG PO TABS: 25.0000 mg | ORAL_TABLET | Freq: Every evening | ORAL | Status: DC | PRN

## 2022-10-20 MED ORDER — COENZYME Q10 50 MG PO CAPS: 1.0000 | ORAL_CAPSULE | Freq: Every day | ORAL | Status: DC

## 2022-10-20 MED ORDER — MEMANTINE HCL 5 MG PO TABS: 10.0000 mg | ORAL_TABLET | Freq: Two times a day (BID) | ORAL | Status: DC

## 2022-10-20 MED ORDER — ACETAMINOPHEN 325 MG PO TABS
650.0000 mg | ORAL_TABLET | ORAL | Status: DC | PRN
  Administered 2022-10-21: 650 mg via ORAL
  Filled 2022-10-20: qty 2

## 2022-10-20 MED ORDER — ADULT MULTIVITAMIN W/MINERALS CH
1.0000 | ORAL_TABLET | Freq: Every day | ORAL | Status: DC
  Administered 2022-10-20 – 2022-10-21 (×2): 1 via ORAL
  Filled 2022-10-20 (×2): qty 1

## 2022-10-20 MED ORDER — DOCUSATE SODIUM 100 MG PO CAPS
100.0000 mg | ORAL_CAPSULE | Freq: Two times a day (BID) | ORAL | Status: DC
  Administered 2022-10-20 – 2022-10-21 (×4): 100 mg via ORAL
  Filled 2022-10-20 (×3): qty 1

## 2022-10-20 MED ORDER — BUSPIRONE HCL 10 MG PO TABS
20.0000 mg | ORAL_TABLET | Freq: Three times a day (TID) | ORAL | Status: DC
  Administered 2022-10-20 – 2022-10-21 (×4): 20 mg via ORAL
  Filled 2022-10-20 (×4): qty 2

## 2022-10-20 MED ORDER — POLYETHYLENE GLYCOL 3350 17 G PO PACK: 17.0000 g | PACK | Freq: Every day | ORAL | Status: DC

## 2022-10-20 MED ORDER — BUDESONIDE 0.5 MG/2ML IN SUSP: 0.5000 mg | Freq: Two times a day (BID) | RESPIRATORY_TRACT | Status: DC

## 2022-10-20 MED ORDER — ASPIRIN 81 MG PO TBEC
81.0000 mg | DELAYED_RELEASE_TABLET | Freq: Every day | ORAL | Status: DC
  Administered 2022-10-20 – 2022-10-21 (×2): 81 mg via ORAL
  Filled 2022-10-20 (×2): qty 1

## 2022-10-20 MED ORDER — ZINC GLUCONATE 50 MG PO TABS: 50.0000 mg | ORAL_TABLET | Freq: Every day | ORAL | Status: DC

## 2022-10-20 MED ORDER — ARFORMOTEROL TARTRATE 15 MCG/2ML IN NEBU: 15.0000 ug | INHALATION_SOLUTION | Freq: Two times a day (BID) | RESPIRATORY_TRACT | Status: DC

## 2022-10-20 MED ORDER — PRENATAL MULTIVITAMIN CH: 1.0000 | ORAL_TABLET | Freq: Every day | ORAL | Status: DC

## 2022-10-20 MED ORDER — ALPHA-LIPOIC ACID 600 MG PO CAPS: 1.0000 | ORAL_CAPSULE | Freq: Every day | ORAL | Status: DC

## 2022-10-20 MED ORDER — MIRTAZAPINE 15 MG PO TABS
30.0000 mg | ORAL_TABLET | Freq: Every day | ORAL | Status: DC
  Administered 2022-10-20: 30 mg via ORAL
  Filled 2022-10-20: qty 1

## 2022-10-20 MED ORDER — MORPHINE SULFATE (PF) 2 MG/ML IV SOLN
2.0000 mg | INTRAVENOUS | Status: DC | PRN
  Administered 2022-10-20: 2 mg via INTRAVENOUS
  Filled 2022-10-20: qty 1

## 2022-10-20 MED ORDER — TRAZODONE HCL 50 MG PO TABS
50.0000 mg | ORAL_TABLET | Freq: Every day | ORAL | Status: DC
  Administered 2022-10-20 (×2): 50 mg via ORAL
  Filled 2022-10-20 (×2): qty 1

## 2022-10-20 MED ORDER — ATORVASTATIN CALCIUM 10 MG PO TABS
10.0000 mg | ORAL_TABLET | ORAL | Status: DC
  Administered 2022-10-20: 10 mg via ORAL
  Filled 2022-10-20: qty 1

## 2022-10-20 MED ORDER — OMEGA-3-ACID ETHYL ESTERS 1 G PO CAPS
1.0000 | ORAL_CAPSULE | Freq: Every day | ORAL | Status: DC
  Administered 2022-10-20 – 2022-10-21 (×2): 1 g via ORAL
  Filled 2022-10-20 (×3): qty 1

## 2022-10-20 MED ORDER — PREGABALIN 75 MG PO CAPS
75.0000 mg | ORAL_CAPSULE | Freq: Two times a day (BID) | ORAL | Status: DC
  Administered 2022-10-20 – 2022-10-21 (×4): 75 mg via ORAL
  Filled 2022-10-20 (×4): qty 1

## 2022-10-20 MED ORDER — LORAZEPAM 2 MG/ML IJ SOLN
0.5000 mg | INTRAMUSCULAR | Status: DC | PRN
  Administered 2022-10-20: 0.5 mg via INTRAVENOUS
  Filled 2022-10-20: qty 1

## 2022-10-20 MED ORDER — ONDANSETRON HCL 4 MG/2ML IJ SOLN: 4.0000 mg | Freq: Four times a day (QID) | INTRAMUSCULAR | Status: DC | PRN

## 2022-10-20 MED ORDER — IOHEXOL 350 MG/ML SOLN
75.0000 mL | Freq: Once | INTRAVENOUS | Status: AC | PRN
  Administered 2022-10-20: 75 mL via INTRAVENOUS

## 2022-10-20 MED ORDER — AMLODIPINE BESYLATE 10 MG PO TABS
5.0000 mg | ORAL_TABLET | Freq: Every day | ORAL | Status: DC
  Administered 2022-10-20 – 2022-10-21 (×2): 5 mg via ORAL
  Filled 2022-10-20 (×2): qty 1

## 2022-10-20 MED ORDER — ARFORMOTEROL TARTRATE 15 MCG/2ML IN NEBU
15.0000 ug | INHALATION_SOLUTION | Freq: Two times a day (BID) | RESPIRATORY_TRACT | Status: DC
  Administered 2022-10-20 – 2022-10-21 (×2): 15 ug via RESPIRATORY_TRACT
  Filled 2022-10-20 (×2): qty 2

## 2022-10-20 MED ORDER — ENOXAPARIN SODIUM 40 MG/0.4ML IJ SOSY
40.0000 mg | PREFILLED_SYRINGE | INTRAMUSCULAR | Status: DC
  Administered 2022-10-20 – 2022-10-21 (×2): 40 mg via SUBCUTANEOUS
  Filled 2022-10-20 (×2): qty 0.4

## 2022-10-20 MED ORDER — POLYETHYLENE GLYCOL 3350 17 G PO PACK
17.0000 g | PACK | Freq: Two times a day (BID) | ORAL | Status: DC
  Administered 2022-10-20 – 2022-10-21 (×3): 17 g via ORAL
  Filled 2022-10-20 (×3): qty 1

## 2022-10-20 NOTE — Consult Note (Signed)
NAME:  Brad [name], MRN:  [id number], DOB:  Oct 24, 1955, LOS: 0 ADMISSION DATE:  10/19/2022, CONSULTATION DATE:  2/21 REFERRING MD:  Alfredia Ferguson, CHIEF COMPLAINT:  dyspnea and cough    History of Present Illness:  83 yom w/ hx as outlined below. Presents to ER 2/21 w/ cc: substernal chest discomfort, occasional dry cough, shortness of breath, burning in eyes and nose. States over last few weeks his AC has been malfunctioning leaking what he describes as fiberglass into air. States these symptoms are worse when he is at home and resolve when he is away (being the primary reason he keeps coming to the ER)  He was seen by cardiology who did not feel his chest pain was c/w ACS,. Because he has had repeated visits to the ER w/ similar complaints in addition to multiple others usually revolving around his prior hernia repair. Pulm was asked to see to assist with evaluation of his dyspnea.   Pertinent  Medical History  Anxiety, depression, HTN, HL,   Significant Hospital Events: Including procedures, antibiotic start and stop dates in addition to other pertinent events   2/21 admitted   Interim History / Subjective:  No distress but multiple complaints   Objective   Blood pressure (Abnormal) 106/92, pulse 84, temperature 98.5 F (36.9 C), temperature source Oral, resp. rate 18, height 5' 9"$  (1.753 m), weight 60 kg, SpO2 100 %.       No intake or output data in the 24 hours ending 10/20/22 1017 Filed Weights   10/19/22 1740  Weight: 60 kg    Examination: General: 67 year old male. Nervous/anxious. Hyper focused on bruises from IV site, recent [place] injection site from lovenox and also reports difficulty talking due to shortness of breath  HENT: NCAT no JVD no upper airway wheezing  Lungs: clear  Cardiovascular: rrr Abdomen: soft  Extremities: warm and dry does have some areas of ecchymosis on his right arm from three previous IV sites Neuro: awake and oriented    Resolved Hospital Problem list     Assessment & Plan:  Dyspnea w/ chest pain.  -agree do not think that this is cardiac in nature. He reports exposure related dyspnea, cough and chest congestion from his [facility] unit. Certainly he could have reactive airway disease or even some degree of hypersensitivity pneumonitis (HP) but CT scan really doesn't support HP.   Plan Will send HP panel Add scheduled brovana and pulmicort Would benefit from PFTs in the outpt setting The best thing for him to do is either get his AC fixed or move. I suggested he consider contacting the city to assist w/ this concern   Best Practice (right click and "Reselect all SmartList Selections" daily)   Per primary   Labs   CBC: Recent Labs  Lab 10/13/22 1410 10/17/22 1508 10/19/22 2324  WBC 6.1 5.8 8.9  NEUTROABS 4.2 3.8 5.9  HGB 13.7 13.9 15.1  HCT 39.2 39.9 42.7  MCV 89.9 89.5 89.7  PLT 196 192 [id number]    Basic Metabolic Panel: Recent Labs  Lab 10/13/22 1410 10/17/22 1508 10/19/22 2324  NA 130* 133* 127*  K 3.8 3.5 3.7  CL 98 101 95*  CO2 21* 22 22  GLUCOSE 100* 106* 111*  BUN 15 16 22  $ CREATININE 0.81 0.69 0.91  CALCIUM 9.1 9.3 9.5   GFR: Estimated Creatinine Clearance: 66.8 mL/min (by C-G formula based on SCr of 0.91 mg/dL). Recent Labs  Lab 10/13/22 1410 10/17/22 1508 10/19/22 2324  WBC 6.1 5.8 8.9    Liver Function Tests: Recent Labs  Lab 10/13/22 1410 10/17/22 1508  AST 28 25  ALT 25 23  ALKPHOS 56 63  BILITOT 1.0 0.9  PROT 6.7 7.2  ALBUMIN 4.3 4.9   Recent Labs  Lab 10/13/22 1410 10/17/22 1508  LIPASE 44 45   No results for input(s): "AMMONIA" in the last 168 hours.  ABG    Component Value Date/Time   TCO2 26 01/28/2008 1705     Coagulation Profile: No results for input(s): "INR", "PROTIME" in the last 168 hours.  Cardiac Enzymes: No results for input(s): "CKTOTAL", "CKMB", "CKMBINDEX", "TROPONINI" in the last 168 hours.  HbA1C: Hgb A1c MFr Bld  Date/Time Value Ref Range Status  02/28/2019  06:24 AM 5.5 4.8 - 5.6 % Final    Comment:    (NOTE) Pre diabetes:          5.7%-6.4% Diabetes:              >6.4% Glycemic control for   <7.0% adults with diabetes     CBG: No results for input(s): "GLUCAP" in the last 168 hours.  Review of Systems:   Review of Systems  Constitutional:  Negative for fever and weight loss.  HENT:  Positive for congestion, sinus pain and sore throat.   Eyes:  Positive for redness.  Respiratory:  Positive for cough and shortness of breath. Negative for wheezing.   Cardiovascular:  Positive for chest pain.  Gastrointestinal: Negative.   Genitourinary: Negative.   Musculoskeletal: Negative.   Skin:  Negative for rash.  Endo/Heme/Allergies: Negative.      Past Medical History:  He,  has a past medical history of Allergy, Anxiety, Depression, DJD (degenerative joint disease), cervical, Dysentery, amebic, acute (1981), GERD (gastroesophageal reflux disease), H/O bronchitis, H/O malaria (1984), Hearing loss, Hypertension, Inguinal hernia, Insomnia, MVP (mitral valve prolapse), Perianal pain, Personal history of colonic polyps, and Tinnitus.   Surgical History:   Past Surgical History:  Procedure Laterality Date   CARPAL TUNNEL RELEASE  10/06, 5/10   right wrist    CARPAL TUNNEL RELEASE  3/10   left wrist    cervical spine discectomy   09/2005   COLONOSCOPY WITH PROPOFOL N/A 10/21/2015   Procedure: COLONOSCOPY WITH PROPOFOL;  Surgeon: [name], MD;  Location: [facility];  Service: Endoscopy;  Laterality: N/A;   INGUINAL HERNIA REPAIR  08/16/2011   Procedure: HERNIA REPAIR INGUINAL ADULT;  Surgeon: [name], MD;  Location: [place];  Service: General;  Laterality: Left;   INGUINAL HERNIA REPAIR Right 05/03/2014   Procedure: OPEN RIGHT INGUINAL HERNIA REPAIR WITH MESH;  Surgeon: [name], MD;  Location: [facility];  Service: General;  Laterality: Right;   INGUINAL HERNIA REPAIR Left 08/20/2022   Procedure: HERNIA  REPAIR INGUINAL ADULT;  Surgeon: [name], MD;  Location: [facility];  Service: General;  Laterality: Left;   INSERTION OF MESH Right 05/03/2014   Procedure: INSERTION OF MESH;  Surgeon: [name], MD;  Location: [facility];  Service: General;  Laterality: Right;   TONSILLECTOMY  1960     Social History:   reports that he has never smoked. He has never used smokeless tobacco. He reports current alcohol use. He reports current drug use. Drug: Marijuana.   Family History:  His family history includes Cancer in his mother; Intracerebral hemorrhage in his father.   Allergies Allergies  Allergen Reactions   Duloxetine Hcl Other (See Comments)  Manic emotions   Gabapentin Other (See Comments)    Unable to urinate, drowsiness   Trileptal [Oxcarbazepine] Other (See Comments)    Bad taste in his mouth     Home Medications  Prior to Admission medications   Medication Sig Start Date End Date Taking? Authorizing Provider  Alpha-Lipoic Acid 600 MG CAPS Take 1 capsule by mouth daily.    [provider]  amLODipine (NORVASC) 5 MG tablet Take 2 tablets (10 mg total) by mouth daily. Patient taking differently: Take 5 mg by mouth daily. 03/02/19   Johnn Hai, MD  atorvastatin (LIPITOR) 10 MG tablet Take 10 mg by mouth 3 (three) times a week. MWF 08/10/19   [provider]  busPIRone (BUSPAR) 10 MG tablet Take 20 mg by mouth 3 (three) times daily.     [provider]  Cholecalciferol (VITAMIN D3) 125 MCG (5000 UT) TABS Take 1 tablet by mouth daily.    [provider]  Coenzyme Q10 50 MG CAPS Take 1 capsule by mouth daily.    [provider]  dicyclomine (BENTYL) 20 MG tablet Take 1 tablet (20 mg total) by mouth 3 (three) times daily as needed for spasms. 10/13/22   Long, Wonda Olds, MD  docusate sodium (COLACE) 100 MG capsule Take 1 capsule (100 mg total) by mouth every 12 (twelve) hours. 09/26/22   Valarie Merino, MD  lidocaine (LIDODERM) 5 % 1 patch daily.     [provider]  LORazepam (ATIVAN) 1 MG tablet Take 1 mg by mouth 2 (two) times daily. 10/26/18   [provider]  losartan (COZAAR) 100 MG tablet Take 100 mg by mouth daily. 02/04/20   [provider]  memantine (NAMENDA) 10 MG tablet Take 1 tablet (10 mg total) by mouth 2 (two) times daily. Patient not taking: Reported on 08/20/2022 03/02/19   Johnn Hai, MD  Methylfol-Methylcob-Acetylcyst (CEREFOLIN NAC) 6-2-600 MG TABS 1 a day Patient not taking: Reported on 08/20/2022 03/02/19   Johnn Hai, MD  mirtazapine (REMERON) 30 MG tablet Take 30 mg by mouth at bedtime.    [provider]  Multiple Vitamin (MULTIVITAMIN) tablet Take 1 tablet by mouth daily.    [provider]  Omega-3 1000 MG CAPS Take 1 capsule by mouth daily.    [provider]  oxyCODONE (OXY IR/ROXICODONE) 5 MG immediate release tablet Take 1 tablet (5 mg total) by mouth every 6 (six) hours as needed for severe pain or breakthrough pain. Patient not taking: Reported on 09/30/2022 08/23/22   Michael Boston, MD  polyethylene glycol (MIRALAX) 17 g packet Take 17 g by mouth daily. 08/26/22   Rancour, Annie Main, MD  pregabalin (LYRICA) 75 MG capsule Take 75 mg by mouth 2 (two) times daily.    [provider]  Prenatal Vit-Fe Fumarate-FA (PRENATAL MULTIVITAMIN) TABS tablet Take 1 tablet by mouth daily. Patient not taking: Reported on 08/20/2022 03/03/19   Johnn Hai, MD  senna-docusate (SENOKOT-S) 8.6-50 MG tablet Take 1 tablet by mouth at bedtime as needed for mild constipation. 10/13/22   Long, Wonda Olds, MD  traZODone (DESYREL) 50 MG tablet Take 50 mg by mouth at bedtime.    [provider]  zaleplon (SONATA) 10 MG capsule Take 10 mg by mouth at bedtime as needed for sleep.    [provider]  zinc gluconate 50 MG tablet Take 50 mg by mouth daily.    [provider]  zolpidem (AMBIEN CR) 12.5 MG CR tablet Take 12.5  mg by mouth at bedtime.      [provider]     Critical care time: NA   Erick Colace ACNP-BC [place] Pager # [phone] OR # [phone] if no answer

## 2022-10-20 NOTE — Assessment & Plan Note (Addendum)
-   We will continue statin therapy and Lovaza. 

## 2022-10-20 NOTE — Consult Note (Signed)
Cardiology Consultation   Patient ID: Brad [name] [facility] MRN: [id number]; DOB: Mar 10, 1956  Admit date: 10/19/2022 Date of Consult: 10/20/2022  PCP:  Patient, No Pcp Per   [place] Providers Cardiologist:  New to [facility]     Patient Profile:   Brad [name] is a 67 y.o. male with a hx of anxiety, depression, hypertension and hyperlipidemia who is being seen 10/20/2022 for the evaluation of chest pain at the request of Dr. Alfredia Ferguson.  History of Present Illness:   Brad [name] is a 67 year old male with past medical history of anxiety, depression, hypertension and hyperlipidemia.  According to previous ED note from 2020, he used to be a clinical psychologist.  It was reportedly that patient had history of bipolar disorder, however he adamantly denies.  He is being followed by a psychiatrist.  This is his 11th ED visit in the past 30 days.  He has previously complained of multiple different concerns since October 2023.  Due to left lower quadrant pain, he underwent left recurrent inguinal hernia repair by Dr. Georgette Dover on 08/20/2022.  Since then, he has been complaining of occasional constipations.  In the past few weeks, he says his [facility] is broken and is leaking fiberglass fibers into the air.  He has been having worsening shortness of breath after breathing in the fibers and also a burning sensation in the substernal area.  He attributed all of his current symptoms to environmental damages.  He states that the burning chest pain sometimes would last hours to days at a time and is currently constant.  It is worse with taking a deep breath.  He does cough a lot when he takes a deep breath.  He says he could not stay at home, that is why he has been coming to the ED soft time.  He has not taken any of his medication for the past 3 days.  Although according to the ED note from 2020, he is compliance with medication is quite questionable.    He returned to the ED yesterday due to persistent burning  chest discomfort and shortness of breath.  He also complains of a burning sensation in his eyes related to " fiberglass particles leaking out from [facility]".  On initial arrival, blood pressure was 168/96.  Blood pressure normalized without treatment.  Sodium 133, serial troponin 22--> 29.  Chest x-ray shows no acute finding, distended bowel beneath the right hemidiaphragm.  EKG showed normal sinus rhythm, no significant ST-T wave changes.  D-dimer elevated at 1.54, subsequent CTA was negative for acute finding, slightly dilated aortic root measuring at 4.0 cm.  Sodium did drop down to 127 this morning.  Cardiology service consulted for chest pain.   Past Medical History:  Diagnosis Date   Allergy    Anxiety    Depression    DJD (degenerative joint disease), cervical    postiton with pillow under knees, cant turn neck    Dysentery, amebic, acute 1981   GERD (gastroesophageal reflux disease)    H/O bronchitis    H/O malaria 1984   Hearing loss    bilateral    Hypertension    labile Blood pressure   Inguinal hernia    Insomnia    early morning awakening   MVP (mitral valve prolapse)    "no problems"   Perianal pain    Personal history of colonic polyps    Tinnitus    right ear    Past Surgical History:  Procedure  Laterality Date   CARPAL TUNNEL RELEASE  10/06, 5/10   right wrist    CARPAL TUNNEL RELEASE  3/10   left wrist    cervical spine discectomy   09/2005   COLONOSCOPY WITH PROPOFOL N/A 10/21/2015   Procedure: COLONOSCOPY WITH PROPOFOL;  Surgeon: [name], MD;  Location: [facility];  Service: Endoscopy;  Laterality: N/A;   INGUINAL HERNIA REPAIR  08/16/2011   Procedure: HERNIA REPAIR INGUINAL ADULT;  Surgeon: [name], MD;  Location: [place];  Service: General;  Laterality: Left;   INGUINAL HERNIA REPAIR Right 05/03/2014   Procedure: OPEN RIGHT INGUINAL HERNIA REPAIR WITH MESH;  Surgeon: [name], MD;  Location: [facility];  Service: General;   Laterality: Right;   INGUINAL HERNIA REPAIR Left 08/20/2022   Procedure: HERNIA REPAIR INGUINAL ADULT;  Surgeon: [name], MD;  Location: [facility];  Service: General;  Laterality: Left;   INSERTION OF MESH Right 05/03/2014   Procedure: INSERTION OF MESH;  Surgeon: [name], MD;  Location: [facility];  Service: General;  Laterality: Right;   TONSILLECTOMY  1960     Home Medications:  Prior to Admission medications   Medication Sig Start Date End Date Taking? Authorizing Provider  Alpha-Lipoic Acid 600 MG CAPS Take 1 capsule by mouth daily.    [provider]  amLODipine (NORVASC) 5 MG tablet Take 2 tablets (10 mg total) by mouth daily. Patient taking differently: Take 5 mg by mouth daily. 03/02/19   Johnn Hai, MD  atorvastatin (LIPITOR) 10 MG tablet Take 10 mg by mouth 3 (three) times a week. MWF 08/10/19   [provider]  busPIRone (BUSPAR) 10 MG tablet Take 20 mg by mouth 3 (three) times daily.     [provider]  Cholecalciferol (VITAMIN D3) 125 MCG (5000 UT) TABS Take 1 tablet by mouth daily.    [provider]  Coenzyme Q10 50 MG CAPS Take 1 capsule by mouth daily.    [provider]  dicyclomine (BENTYL) 20 MG tablet Take 1 tablet (20 mg total) by mouth 3 (three) times daily as needed for spasms. 10/13/22   Long, Wonda Olds, MD  docusate sodium (COLACE) 100 MG capsule Take 1 capsule (100 mg total) by mouth every 12 (twelve) hours. 09/26/22   Valarie Merino, MD  lidocaine (LIDODERM) 5 % 1 patch daily.    [provider]  LORazepam (ATIVAN) 1 MG tablet Take 1 mg by mouth 2 (two) times daily. 10/26/18   [provider]  losartan (COZAAR) 100 MG tablet Take 100 mg by mouth daily. 02/04/20   [provider]  memantine (NAMENDA) 10 MG tablet Take 1 tablet (10 mg total) by mouth 2 (two) times daily. Patient not taking: Reported on 08/20/2022 03/02/19   Johnn Hai, MD  Methylfol-Methylcob-Acetylcyst (CEREFOLIN NAC) 6-2-600  MG TABS 1 a day Patient not taking: Reported on 08/20/2022 03/02/19   Johnn Hai, MD  mirtazapine (REMERON) 30 MG tablet Take 30 mg by mouth at bedtime.    [provider]  Multiple Vitamin (MULTIVITAMIN) tablet Take 1 tablet by mouth daily.    [provider]  Omega-3 1000 MG CAPS Take 1 capsule by mouth daily.    [provider]  oxyCODONE (OXY IR/ROXICODONE) 5 MG immediate release tablet Take 1 tablet (5 mg total) by mouth every 6 (six) hours as needed for severe pain or breakthrough pain. Patient not taking: Reported on 09/30/2022 08/23/22   Michael Boston, MD  polyethylene glycol (MIRALAX) 17 g packet Take 17 g by mouth daily. 08/26/22   Rancour, Annie Main, MD  pregabalin (LYRICA) 75 MG capsule Take 75 mg by mouth 2 (two) times daily.    [provider]  Prenatal Vit-Fe Fumarate-FA (PRENATAL MULTIVITAMIN) TABS tablet Take 1 tablet by mouth daily. Patient not taking: Reported on 08/20/2022 03/03/19   Johnn Hai, MD  senna-docusate (SENOKOT-S) 8.6-50 MG tablet Take 1 tablet by mouth at bedtime as needed for mild constipation. 10/13/22   Long, Wonda Olds, MD  traZODone (DESYREL) 50 MG tablet Take 50 mg by mouth at bedtime.    [provider]  zaleplon (SONATA) 10 MG capsule Take 10 mg by mouth at bedtime as needed for sleep.    [provider]  zinc gluconate 50 MG tablet Take 50 mg by mouth daily.    [provider]  zolpidem (AMBIEN CR) 12.5 MG CR tablet Take 12.5 mg by mouth at bedtime.     [provider]    Inpatient Medications: Scheduled Meds:  amLODipine  5 mg Oral Daily   aspirin EC  81 mg Oral Daily   atorvastatin  10 mg Oral Once per day on Mon Wed Fri   busPIRone  20 mg Oral TID   cholecalciferol  5,000 Units Oral Daily   docusate sodium  100 mg Oral Q12H   enoxaparin (LOVENOX) injection  40 mg Subcutaneous Q24H   LORazepam  1 mg Oral BID   losartan  100 mg Oral Daily   memantine  10 mg Oral BID   mirtazapine   30 mg Oral QHS   multivitamin with minerals  1 tablet Oral Daily   omega-3 acid ethyl esters  1 capsule Oral Daily   polyethylene glycol  17 g Oral Daily   pregabalin  75 mg Oral BID   prenatal multivitamin  1 tablet Oral Daily   sodium chloride (PF)       traZODone  50 mg Oral QHS   zinc gluconate  50 mg Oral Daily   Continuous Infusions:  sodium chloride Stopped (10/20/22 0100)   PRN Meds: acetaminophen, ALPRAZolam, dicyclomine, LORazepam, magnesium hydroxide, morphine injection, nitroGLYCERIN, ondansetron (ZOFRAN) IV, senna-docusate, sodium chloride (PF), traZODone, zolpidem  Allergies:    Allergies  Allergen Reactions   Duloxetine Hcl Other (See Comments)   Gabapentin Other (See Comments)    Unable to urinate, drowsiness   Trileptal [Oxcarbazepine] Other (See Comments)    Bad taste in his mouth    Social History:   Social History   Socioeconomic History   Marital status: Married    Spouse name: Not on file   Number of children: 2   Years of education: Not on file   Highest education level: Professional school degree (e.g., MD, DDS, DVM, JD)  Occupational History   Occupation: psychologist  Tobacco Use   Smoking status: Never   Smokeless tobacco: Never  Vaping Use   Vaping Use: Never used  Substance and Sexual Activity   Alcohol use: Yes    Comment: 2 wine daily   Drug use: Yes    Types: Marijuana    Comment: weekend use   Sexual activity: Yes  Other Topics Concern   Not on file  Social History Narrative   Lives with wife and son in a 3 story home.  His daughter passed away from drug overdose.  He is a self employed Investment banker, operational.     Social Determinants of Health   Financial Resource Strain:  Not on file  Food Insecurity: No Food Insecurity (08/20/2022)   Hunger Vital Sign    Worried About Running Out of Food in the Last Year: Never true    Ran Out of Food in the Last Year: Never true  Transportation Needs: No Transportation Needs (08/20/2022)    PRAPARE - Hydrologist (Medical): No    Lack of Transportation (Non-Medical): No  Physical Activity: Not on file  Stress: Not on file  Social Connections: Not on file  Intimate Partner Violence: Not At Risk (08/20/2022)   Humiliation, Afraid, Rape, and Kick questionnaire    Fear of Current or Ex-Partner: No    Emotionally Abused: No    Physically Abused: No    Sexually Abused: No    Family History:    Family History  Problem Relation Age of Onset   Cancer Mother        breast   Intracerebral hemorrhage Father      ROS:  Please see the history of present illness.   All other ROS reviewed and negative.     Physical Exam/Data:   Vitals:   10/20/22 0500 10/20/22 0600 10/20/22 0645 10/20/22 0801  BP: 98/64 102/73  (!) 106/92  Pulse: 65 68 76 84  Resp:    18  Temp:    98.5 F (36.9 C)  TempSrc:    Oral  SpO2: 97% 100% 98% 100%  Weight:      Height:       No intake or output data in the 24 hours ending 10/20/22 0826    10/19/2022    5:40 PM 10/13/2022    1:28 PM 10/11/2022    4:26 PM  Last 3 Weights  Weight (lbs) 132 lb 4.4 oz 132 lb 4.4 oz 130 lb  Weight (kg) 60 kg 60 kg 58.968 kg     Body mass index is 19.53 kg/m.  General:  Well nourished, well developed, in no acute distress HEENT: normal Neck: no JVD Vascular: No carotid bruits; Distal pulses 2+ bilaterally Cardiac:  normal S1, S2; RRR; no murmur  Lungs:  clear to auscultation bilaterally, no wheezing, rhonchi or rales  Abd: soft, nontender, no hepatomegaly  Ext: no edema Musculoskeletal:  No deformities, BUE and BLE strength normal and equal Skin: warm and dry  Neuro:  CNs 2-12 intact, no focal abnormalities noted Psych:  Normal affect   EKG:  The EKG was personally reviewed and demonstrates: Normal sinus rhythm, no significant ST-T wave changes Telemetry:  Telemetry was personally reviewed and demonstrates: Normal sinus rhythm, no significant ventricular  ectopy.  Relevant CV Studies:  N/A  Laboratory Data:  High Sensitivity Troponin:   Recent Labs  Lab 10/02/22 0936 10/02/22 1124 10/19/22 1750 10/19/22 2128  TROPONINIHS 5 5 22* 29*     Chemistry Recent Labs  Lab 10/13/22 1410 10/17/22 1508 10/19/22 2324  NA 130* 133* 127*  K 3.8 3.5 3.7  CL 98 101 95*  CO2 21* 22 22  GLUCOSE 100* 106* 111*  BUN 15 16 22  $ CREATININE 0.81 0.69 0.91  CALCIUM 9.1 9.3 9.5  GFRNONAA >60 >60 >60  ANIONGAP 11 10 10    $ Recent Labs  Lab 10/13/22 1410 10/17/22 1508  PROT 6.7 7.2  ALBUMIN 4.3 4.9  AST 28 25  ALT 25 23  ALKPHOS 56 63  BILITOT 1.0 0.9   Lipids No results for input(s): "CHOL", "TRIG", "HDL", "LABVLDL", "LDLCALC", "CHOLHDL" in the last 168 hours.  Hematology  Recent Labs  Lab 10/13/22 1410 10/17/22 1508 10/19/22 2324  WBC 6.1 5.8 8.9  RBC 4.36 4.46 4.76  HGB 13.7 13.9 15.1  HCT 39.2 39.9 42.7  MCV 89.9 89.5 89.7  MCH 31.4 31.2 31.7  MCHC 34.9 34.8 35.4  RDW 12.9 12.9 12.5  PLT 196 192 211   Thyroid No results for input(s): "TSH", "FREET4" in the last 168 hours.  BNPNo results for input(s): "BNP", "PROBNP" in the last 168 hours.  DDimer  Recent Labs  Lab 10/19/22 2324  DDIMER 1.54*     Radiology/Studies:  CT Angio Chest PE W and/or Wo Contrast  Result Date: 10/20/2022 CLINICAL DATA:  Pulmonary embolism suspected, high probability. Elevated D-dimer. EXAM: CT ANGIOGRAPHY CHEST WITH CONTRAST TECHNIQUE: Multidetector CT imaging of the chest was performed using the standard protocol during bolus administration of intravenous contrast. Multiplanar CT image reconstructions and MIPs were obtained to evaluate the vascular anatomy. RADIATION DOSE REDUCTION: This exam was performed according to the departmental dose-optimization program which includes automated exposure control, adjustment of the mA and/or kV according to patient size and/or use of iterative reconstruction technique. CONTRAST:  85m OMNIPAQUE IOHEXOL 350  MG/ML SOLN COMPARISON:  Portable chest yesterday, PA Lat 10/11/2022, CTA chest 08/26/2022 FINDINGS: Cardiovascular: Mild panchamber cardiomegaly is again noted. There is no venous dilatation. The pulmonary arteries are normal in caliber without visible emboli. There is aortic atherosclerosis and scattered calcification in the LAD and circumflex coronary arteries. The aortic root at the sinuses of Valsalva is slightly dilated at 4.0 cm. The remaining aorta is within normal caliber limits. No aortic dissection or stenosis is seen. The great vessels are not opacified but are normal caliber. Mediastinum/Nodes: No enlarged mediastinal, hilar, or axillary lymph nodes. Thyroid gland, trachea, and esophagus demonstrate no significant findings. Lungs/Pleura: There is mild breathing motion artifact through the bases. No pulmonary infiltrate or nodule is seen. No pleural effusion or pneumothorax. Upper Abdomen: No acute abnormality. Musculoskeletal: There is mild thoracic kyphodextroscoliosis and degenerative changes. Multilevel lower cervical fusion plating is partially visible, extending to and including T1. Mild osteopenia. No suspicious bone lesions or chest wall abnormality. Review of the MIP images confirms the above findings. IMPRESSION: 1. No acute chest CT or CTA findings. 2. Aortic and coronary artery atherosclerosis. 3. Mild cardiomegaly. 4. Slightly dilated aortic root at the sinuses of Valsalva measuring 4 cm. Recommend annual imaging followup by CTA or MRA. This recommendation follows 2010 ACCF/AHA/AATS/ACR/ASA/SCA/SCAI/SIR/STS/SVM Guidelines for the Diagnosis and Management of Patients with Thoracic Aortic Disease. Circulation. 2010; [id number] Aortic aneurysm NOS (ICD10-I71.9) 5. Kyphodextroscoliosis, degenerative and postsurgical changes and osteopenia. Electronically Signed   By: KTelford NabM.D.   On: 10/20/2022 00:38   DG CHEST PORT 1 VIEW  Result Date: 10/19/2022 CLINICAL DATA:  Chest pain  EXAM: PORTABLE CHEST 1 VIEW COMPARISON:  10/11/2022 FINDINGS: No acute airspace disease or effusion. Normal cardiomediastinal silhouette. No pneumothorax. Air distended bowel beneath the right hemidiaphragm. IMPRESSION: No active disease. Air distended bowel beneath the right hemidiaphragm. Electronically Signed   By: KDonavan FoilM.D.   On: 10/19/2022 18:06     Assessment and Plan:   Burning chest pain/shortness of breath  -Patient says that his symptom has been going on for a few weeks, he denies any exertional component however attributed his symptoms to "fiberglass particles" from his AC.  He says the burning chest pain is worse with deep inspiration.  He does have cough but cough does not make it worse.  Serial troponin  22--> 29.  Blood pressure on arrival was 160s, however normalized by itself.  -Troponin trend does not suggest ACS.  Will obtain echocardiogram.  Will discuss with MD, I am concerned that he may not be fully compliant with his medication, which will make him a poor candidate for any invasive study.  If symptom persist, may consider coronary CT if he cannot cooperate.  Anxiety/depression: On Ativan  Hyponatremia: On multiple medications at home including Namenda, Lyrica and Remeron  Hypertension: On amlodipine 5 mg and losartan 100 mg at home  Hyperlipidemia: On Lipitor 10 mg 3 times a week at home.  Possible psychiatric disorder: Previous ED visit from 2020 suggested possibility of bipolar disorder, however patient adamantly denies this.  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   Risk Assessment/Risk Scores:                For questions or updates, please contact [facility] Please consult [email or web address] for contact info under    Hilbert Corrigan, [place]  10/20/2022 8:26 AM

## 2022-10-20 NOTE — Progress Notes (Signed)
PHARMACIST - PHYSICIAN ORDER COMMUNICATION  CONCERNING: P&T Medication Policy on Herbal Medications  DESCRIPTION:  This patient's order for:  alpha-lipoic acid, coenzyme Q-10  has been noted.  This product(s) is classified as an "herbal" or natural product. Due to a lack of definitive safety studies or FDA approval, nonstandard manufacturing practices, plus the potential risk of unknown drug-drug interactions while on inpatient medications, the Pharmacy and Therapeutics Committee does not permit the use of "herbal" or natural products of this type within [facility].   ACTION TAKEN: The pharmacy department is unable to verify this order at this time and your patient has been informed of this safety policy. Please reevaluate patient's clinical condition at discharge and address if the herbal or natural product(s) should be resumed at that time.   Dolly Rias RPh 10/20/2022, 2:55 AM

## 2022-10-20 NOTE — Progress Notes (Signed)
  Echocardiogram 2D Echocardiogram has been performed.  Brad [name] 10/20/2022, 5:11 PM

## 2022-10-20 NOTE — ED Provider Notes (Signed)
Received patient in turnover from Dr. Truett Mainland.  Please see their note for further details of Hx, PE.  Briefly patient is a 67 y.o. male with a Something feels different in his chest .  Has had multiple ED visits recently.  Had positive troponins here.  Plan for observation admission for serial troponins, awaiting blood work D-dimer as he was tachycardic.  D-dimer is resulted and is positive.  Will obtain a CT angiogram of the chest..  CTA negative for PE or occult pneumonia.  The patient did have some mild aortic widening.  Discussed with the patient for follow-up.  Admit for lower his chest pain.    Deno Etienne, DO 10/20/22 [phone]

## 2022-10-20 NOTE — ED Notes (Signed)
ED TO INPATIENT HANDOFF REPORT  ED Nurse Name and Phone #: Izola Price Spencer  S Name/Age/Gender Brad [name] 67 y.o. male Room/Bed: WA19/WA19  Code Status   Code Status: Full Code  Home/SNF/Other N/A Patient oriented to: self, place, time, and situation Is this baseline? Yes   Triage Complete: Triage complete  Chief Complaint Chest pain [R07.9]  Triage Note BIB EMS due to something feels unusual in his chest. Pt has been seen multiple times in last few days. "Thumbs are burning from pulling his pants Up" 154/90-100-24-100%    Allergies Allergies  Allergen Reactions   Duloxetine Hcl Other (See Comments)   Gabapentin Other (See Comments)    Unable to urinate, drowsiness   Trileptal [Oxcarbazepine] Other (See Comments)    Bad taste in his mouth    Level of Care/Admitting Diagnosis ED Disposition     ED Disposition  Admit   Condition  --   [place]: [place] [[id number]]  Level of Care: Telemetry [5]  Admit to tele based on following criteria: Monitor for Ischemic changes  May place patient in observation at [facility] or [place] if equivalent level of care is available:: No  Covid Evaluation: Asymptomatic - no recent exposure (last 10 days) testing not required  Diagnosis: Chest pain [id number]  Admitting Physician: Christel Mormon [id number]  Attending Physician: Christel Mormon [id number]          B Medical/Surgery History Past Medical History:  Diagnosis Date   Allergy    Anxiety    Depression    DJD (degenerative joint disease), cervical    postiton with pillow under knees, cant turn neck    Dysentery, amebic, acute 1981   GERD (gastroesophageal reflux disease)    H/O bronchitis    H/O malaria 1984   Hearing loss    bilateral    Hypertension    labile Blood pressure   Inguinal hernia    Insomnia    early morning awakening   MVP (mitral valve prolapse)    "no problems"   Perianal pain    Personal history of  colonic polyps    Tinnitus    right ear   Past Surgical History:  Procedure Laterality Date   CARPAL TUNNEL RELEASE  10/06, 5/10   right wrist    CARPAL TUNNEL RELEASE  3/10   left wrist    cervical spine discectomy   09/2005   COLONOSCOPY WITH PROPOFOL N/A 10/21/2015   Procedure: COLONOSCOPY WITH PROPOFOL;  Surgeon: [name], MD;  Location: [facility];  Service: Endoscopy;  Laterality: N/A;   INGUINAL HERNIA REPAIR  08/16/2011   Procedure: HERNIA REPAIR INGUINAL ADULT;  Surgeon: [name], MD;  Location: [place];  Service: General;  Laterality: Left;   INGUINAL HERNIA REPAIR Right 05/03/2014   Procedure: OPEN RIGHT INGUINAL HERNIA REPAIR WITH MESH;  Surgeon: [name], MD;  Location: [facility];  Service: General;  Laterality: Right;   INGUINAL HERNIA REPAIR Left 08/20/2022   Procedure: HERNIA REPAIR INGUINAL ADULT;  Surgeon: [name], MD;  Location: [facility];  Service: General;  Laterality: Left;   INSERTION OF MESH Right 05/03/2014   Procedure: INSERTION OF MESH;  Surgeon: [name], MD;  Location: [facility];  Service: General;  Laterality: Right;   TONSILLECTOMY  1960     A IV Location/Drains/Wounds Patient Lines/Drains/Airways Status     Active Line/Drains/Airways     Name Placement date Placement  time Site Days   Peripheral IV 10/19/22 20 G 1" Right Antecubital 10/19/22  2318  Antecubital  1            Intake/Output Last 24 hours No intake or output data in the 24 hours ending 10/20/22 1007  Labs/Imaging Results for orders placed or performed during the hospital encounter of 10/19/22 (from the past 48 hour(s))  Troponin I (High Sensitivity)     Status: Abnormal   Collection Time: 10/19/22  5:50 PM  Result Value Ref Range   Troponin I (High Sensitivity) 22 (H) <18 ng/L    Comment: (NOTE) Elevated high sensitivity troponin I (hsTnI) values and significant  changes across serial measurements may suggest ACS but many other  chronic  and acute conditions are known to elevate hsTnI results.  Refer to the "Links" section for chest pain algorithms and additional  guidance. Performed at [facility], [place] [address], [place], [place] [address]   Troponin I (High Sensitivity)     Status: Abnormal   Collection Time: 10/19/22  9:28 PM  Result Value Ref Range   Troponin I (High Sensitivity) 29 (H) <18 ng/L    Comment: (NOTE) Elevated high sensitivity troponin I (hsTnI) values and significant  changes across serial measurements may suggest ACS but many other  chronic and acute conditions are known to elevate hsTnI results.  Refer to the "Links" section for chest pain algorithms and additional  guidance. Performed at [facility], [place] [address], [place], [place] [address]   CBC with Differential     Status: None   Collection Time: 10/19/22 11:24 PM  Result Value Ref Range   WBC 8.9 4.0 - 10.5 K/uL   RBC 4.76 4.22 - 5.81 MIL/uL   Hemoglobin 15.1 13.0 - 17.0 g/dL   HCT 42.7 39.0 - 52.0 %   MCV 89.7 80.0 - 100.0 fL   MCH 31.7 26.0 - 34.0 pg   MCHC 35.4 30.0 - 36.0 g/dL   RDW 12.5 11.5 - 15.5 %   Platelets 211 150 - 400 K/uL   nRBC 0.0 0.0 - 0.2 %   Neutrophils Relative % 67 %   Neutro Abs 5.9 1.7 - 7.7 K/uL   Lymphocytes Relative 22 %   Lymphs Abs 1.9 0.7 - 4.0 K/uL   Monocytes Relative 11 %   Monocytes Absolute 1.0 0.1 - 1.0 K/uL   Eosinophils Relative 0 %   Eosinophils Absolute 0.0 0.0 - 0.5 K/uL   Basophils Relative 0 %   Basophils Absolute 0.0 0.0 - 0.1 K/uL   Immature Granulocytes 0 %   Abs Immature Granulocytes 0.03 0.00 - 0.07 K/uL    Comment: Performed at [facility], [place] [address] [place], [place] [id number]  Basic metabolic panel     Status: Abnormal   Collection Time: 10/19/22 11:24 PM  Result Value Ref Range   Sodium 127 (L) 135 - 145 mmol/L   Potassium 3.7 3.5 - 5.1 mmol/L   Chloride 95 (L) 98 - 111 mmol/L   CO2 22 22 - 32 mmol/L    Glucose, Bld 111 (H) 70 - 99 mg/dL    Comment: Glucose reference range applies only to samples taken after fasting for at least 8 hours.   BUN 22 8 - 23 mg/dL   Creatinine, Ser 0.91 0.61 - 1.24 mg/dL   Calcium 9.5 8.9 - 10.3 mg/dL   GFR, Estimated >60 >60 mL/min    Comment: (NOTE) Calculated using the  CKD-EPI Creatinine Equation (2021)    Anion gap 10 5 - 15    Comment: Performed at [facility], [place] [address], [place], [place] [address]  D-dimer, quantitative     Status: Abnormal   Collection Time: 10/19/22 11:24 PM  Result Value Ref Range   D-Dimer, Quant 1.54 (H) 0.00 - 0.50 ug/mL-FEU    Comment: (NOTE) At the manufacturer cut-off value of 0.5 g/mL FEU, this assay has a negative predictive value of 95-100%.This assay is intended for use in conjunction with a clinical pretest probability (PTP) assessment model to exclude pulmonary embolism (PE) and deep venous thrombosis (DVT) in outpatients suspected of PE or DVT. Results should be correlated with clinical presentation. Performed at [facility], [place] [address], [place], [place] [address]    CT Angio Chest PE W and/or Wo Contrast  Result Date: 10/20/2022 CLINICAL DATA:  Pulmonary embolism suspected, high probability. Elevated D-dimer. EXAM: CT ANGIOGRAPHY CHEST WITH CONTRAST TECHNIQUE: Multidetector CT imaging of the chest was performed using the standard protocol during bolus administration of intravenous contrast. Multiplanar CT image reconstructions and MIPs were obtained to evaluate the vascular anatomy. RADIATION DOSE REDUCTION: This exam was performed according to the departmental dose-optimization program which includes automated exposure control, adjustment of the mA and/or kV according to patient size and/or use of iterative reconstruction technique. CONTRAST:  75m OMNIPAQUE IOHEXOL 350 MG/ML SOLN COMPARISON:  Portable chest yesterday, PA Lat 10/11/2022, CTA chest 08/26/2022 FINDINGS:  Cardiovascular: Mild panchamber cardiomegaly is again noted. There is no venous dilatation. The pulmonary arteries are normal in caliber without visible emboli. There is aortic atherosclerosis and scattered calcification in the LAD and circumflex coronary arteries. The aortic root at the sinuses of Valsalva is slightly dilated at 4.0 cm. The remaining aorta is within normal caliber limits. No aortic dissection or stenosis is seen. The great vessels are not opacified but are normal caliber. Mediastinum/Nodes: No enlarged mediastinal, hilar, or axillary lymph nodes. Thyroid gland, trachea, and esophagus demonstrate no significant findings. Lungs/Pleura: There is mild breathing motion artifact through the bases. No pulmonary infiltrate or nodule is seen. No pleural effusion or pneumothorax. Upper Abdomen: No acute abnormality. Musculoskeletal: There is mild thoracic kyphodextroscoliosis and degenerative changes. Multilevel lower cervical fusion plating is partially visible, extending to and including T1. Mild osteopenia. No suspicious bone lesions or chest wall abnormality. Review of the MIP images confirms the above findings. IMPRESSION: 1. No acute chest CT or CTA findings. 2. Aortic and coronary artery atherosclerosis. 3. Mild cardiomegaly. 4. Slightly dilated aortic root at the sinuses of Valsalva measuring 4 cm. Recommend annual imaging followup by CTA or MRA. This recommendation follows 2010 ACCF/AHA/AATS/ACR/ASA/SCA/SCAI/SIR/STS/SVM Guidelines for the Diagnosis and Management of Patients with Thoracic Aortic Disease. Circulation. 2010; 121:[id number] Aortic aneurysm NOS (ICD10-I71.9) 5. Kyphodextroscoliosis, degenerative and postsurgical changes and osteopenia. Electronically Signed   By: KTelford NabM.D.   On: 10/20/2022 00:38   DG CHEST PORT 1 VIEW  Result Date: 10/19/2022 CLINICAL DATA:  Chest pain EXAM: PORTABLE CHEST 1 VIEW COMPARISON:  10/11/2022 FINDINGS: No acute airspace disease or effusion.  Normal cardiomediastinal silhouette. No pneumothorax. Air distended bowel beneath the right hemidiaphragm. IMPRESSION: No active disease. Air distended bowel beneath the right hemidiaphragm. Electronically Signed   By: KDonavan FoilM.D.   On: 10/19/2022 18:06    Pending Labs Unresulted Labs (From admission, onward)    None       Vitals/Pain Today's Vitals   10/20/22 0600 10/20/22 0645 10/20/22  0759 10/20/22 0801  BP: 102/73   (!) 106/92  Pulse: 68 76  84  Resp:    18  Temp:    98.5 F (36.9 C)  TempSrc:    Oral  SpO2: 100% 98%  100%  Weight:      Height:      PainSc:   7      Isolation Precautions No active isolations  Medications Medications  sodium chloride (PF) 0.9 % injection (has no administration in time range)  amLODipine (NORVASC) tablet 5 mg (has no administration in time range)  atorvastatin (LIPITOR) tablet 10 mg (10 mg Oral Given by Other 10/20/22 0855)  losartan (COZAAR) tablet 100 mg (has no administration in time range)  busPIRone (BUSPAR) tablet 20 mg (has no administration in time range)  LORazepam (ATIVAN) tablet 1 mg (1 mg Oral Not Given 10/20/22 0332)  memantine (NAMENDA) tablet 10 mg (10 mg Oral Not Given 10/20/22 0319)  mirtazapine (REMERON) tablet 30 mg (30 mg Oral Given 10/20/22 0319)  traZODone (DESYREL) tablet 50 mg (50 mg Oral Given 10/20/22 0310)  zolpidem (AMBIEN) tablet 5 mg (5 mg Oral Given 10/20/22 0310)  dicyclomine (BENTYL) tablet 20 mg (has no administration in time range)  docusate sodium (COLACE) capsule 100 mg (100 mg Oral Given 10/20/22 0319)  senna-docusate (Senokot-S) tablet 1 tablet (has no administration in time range)  pregabalin (LYRICA) capsule 75 mg (75 mg Oral Given 10/20/22 0319)  cholecalciferol (VITAMIN D3) 25 MCG (1000 UNIT) tablet 5,000 Units (has no administration in time range)  multivitamin with minerals tablet 1 tablet (has no administration in time range)  omega-3 acid ethyl esters (LOVAZA) capsule 1 g (has no  administration in time range)  prenatal multivitamin tablet 1 tablet (has no administration in time range)  zinc gluconate tablet 50 mg (has no administration in time range)  acetaminophen (TYLENOL) tablet 650 mg (has no administration in time range)  ondansetron (ZOFRAN) injection 4 mg (has no administration in time range)  enoxaparin (LOVENOX) injection 40 mg (has no administration in time range)  0.9 %  sodium chloride infusion (0 mLs Intravenous Hold 10/20/22 0100)  aspirin EC tablet 81 mg (has no administration in time range)  traZODone (DESYREL) tablet 25 mg (has no administration in time range)  magnesium hydroxide (MILK OF MAGNESIA) suspension 30 mL (has no administration in time range)  ALPRAZolam (XANAX) tablet 0.25 mg (has no administration in time range)  nitroGLYCERIN (NITROSTAT) SL tablet 0.4 mg (has no administration in time range)  morphine (PF) 2 MG/ML injection 2 mg (2 mg Intravenous Given 10/20/22 0310)  LORazepam (ATIVAN) injection 0.5 mg (0.5 mg Intravenous Given 10/20/22 0309)  polyethylene glycol (MIRALAX / GLYCOLAX) packet 17 g (has no administration in time range)  alum & mag hydroxide-simeth (MAALOX/MYLANTA) 200-200-20 MG/5ML suspension 30 mL (30 mLs Oral Given 10/19/22 1906)    And  lidocaine (XYLOCAINE) 2 % viscous mouth solution 15 mL (15 mLs Oral Given 10/19/22 1906)  acetaminophen (TYLENOL) tablet 1,000 mg (1,000 mg Oral Given 10/19/22 1905)  sodium chloride 0.9 % bolus 1,000 mL (0 mLs Intravenous Stopped 10/20/22 0000)  sodium chloride 0.9 % bolus 1,000 mL (0 mLs Intravenous Stopped 10/20/22 0248)  iohexol (OMNIPAQUE) 350 MG/ML injection 75 mL (75 mLs Intravenous Contrast Given 10/20/22 0005)    Mobility walks     Focused Assessments N/A   R Recommendations: See Admitting Provider Note  Report given to:   Additional Notes: N/A

## 2022-10-20 NOTE — Consult Note (Addendum)
[facility] Consult   Reason for Consult:  anxiety and somatic complaints  Referring Physician:  emergency room provider Patient Identification: Brad [name] MRN:  [id number] Principal Diagnosis: Chest pain Diagnosis:  Principal Problem:   Chest pain Active Problems:   Essential hypertension   Dyslipidemia   Anxiety and depression   Peripheral neuropathy   Total Time spent with patient: 15 minutes  Subjective:   Brad [name] is a 67 y.o. male patient admitted with chest discomfort. Patient initially presented with increase anxiety, and hyper focused, tangential in thoughts therefore psych consult was placed. Of note patient has had 11 ER visits in the past 3 weeks (1/31-today), multiple complaints to include anxiety, constipation, abdominal pain, somatic concerns, chronic pain syndrome, groin pain. He has been seen a total of 20 times in the ED in the past 6 months. He has been seen by neuro x 2, with no additional diagnoses being offered. Recently seen by his primary who felt his increased pain is 2/t anxiety, and didn't feel he would beenfit from increasing his CNS depressant medications.   Brad [name] was seen and evaluated face-to-face by this provider.  Psychiatric consult was placed due to " history of anxiety and depression "excessive anxiety and psychosomatic symptoms."  Patient was asked the reason for this admission? "  I been seen multiple times for anxiety and constipation.  Reports increased anxiety related to discharging home as he states he does not want to go home due to deteriorating HVAC system in his apartment.  Stated that HVAC system is "disseminated particles" throughout the air, which  he reports he is  inhaling and feels as if the particles have been affecting his eyesight.  Does report that his refrigerator not operating effectively also.  States he did file a complaint to the Educational psychologist. Win reports moments where he is feeling down and depressed  related to current life stressor and "situations that don't go according to plan".   Brad [name] stated "sometimes I do not want to be here,But I don't want to hurt or harm myself."  Brad [name] denied suicidal or homicidal ideations.  Denied suicidal plan or intent.  Does report smoking marijuana a few months ago however, has discontinued due to increased anxiety.  Denies any other substance abuse or illicit drug use. Reports he is followed by Dr. Marye Round for medication management.  Denied that he is followed by therapy currently.  Reports he is currently married however his wife resides in [place]. "  I have no desire to live there."  Reports he has been medication compliant.  Denies auditory or visual hallucinations.  During evaluation Brad [name] is resting in bed. he is alert/oriented x 3; calm/cooperative; and mood congruent with affect.  Patient is speaking in a clear tone at decreased volume, and normal slightly pressured pace; with good eye contact. His thought process is coherent and relevant; There is no indication that he is currently responding to internal/external stimuli or experiencing delusional thought content.  Patient denies suicidal/self-harm/homicidal ideation, psychosis, and paranoia.  Patient has remained calm throughout assessment and has answered questions appropriately.    HPI:   Per admission assessment note: "58 yom w/ hx as outlined below. Presents to ER 2/21 w/ cc: substernal chest discomfort, occasional dry cough, shortness of breath, burning in eyes and nose. States over last few weeks his AC has been malfunctioning leaking what he describes as fiberglass into air. States these symptoms are worse when he is  at home and resolve when he is away (being the primary reason he keeps coming to the ER)  He was seen by cardiology who did not feel his chest pain was c/w ACS,. Because he has had repeated visits to the ER w/ similar complaints in addition to multiple others usually revolving around  his prior hernia repair. Pulm was asked to see to assist with evaluation of his dyspnea."     Past Psychiatric History: Anxiety, MDD, Insomnia.   Past psych medications: Buspar 8m po TID, Trazodone as needed, Ativan 144mpo BID   Risk to Self:   Risk to Others:   Prior Inpatient Therapy:   Prior Outpatient Therapy:    Past Medical History:  Past Medical History:  Diagnosis Date   Allergy    Anxiety    Depression    DJD (degenerative joint disease), cervical    postiton with pillow under knees, cant turn neck    Dysentery, amebic, acute 1981   GERD (gastroesophageal reflux disease)    H/O bronchitis    H/O malaria 1984   Hearing loss    bilateral    Hypertension    labile Blood pressure   Inguinal hernia    Insomnia    early morning awakening   MVP (mitral valve prolapse)    "no problems"   Perianal pain    Personal history of colonic polyps    Tinnitus    right ear    Past Surgical History:  Procedure Laterality Date   CARPAL TUNNEL RELEASE  10/06, 5/10   right wrist    CARPAL TUNNEL RELEASE  3/10   left wrist    cervical spine discectomy   09/2005   COLONOSCOPY WITH PROPOFOL N/A 10/21/2015   Procedure: COLONOSCOPY WITH PROPOFOL;  Surgeon: MaGarlan FairMD;  Location: [facility];  Service: Endoscopy;  Laterality: N/A;   INGUINAL HERNIA REPAIR  08/16/2011   Procedure: HERNIA REPAIR INGUINAL ADULT;  Surgeon: MaPedro EarlsMD;  Location: [facility] Service: General;  Laterality: Left;   INGUINAL HERNIA REPAIR Right 05/03/2014   Procedure: OPEN RIGHT INGUINAL HERNIA REPAIR WITH MESH;  Surgeon: MaKaylyn LimMD;  Location: [facility];  Service: General;  Laterality: Right;   INGUINAL HERNIA REPAIR Left 08/20/2022   Procedure: HERNIA REPAIR INGUINAL ADULT;  Surgeon: TsDonnie MesaMD;  Location: [facility];  Service: General;  Laterality: Left;   INSERTION OF MESH Right 05/03/2014   Procedure: INSERTION OF MESH;  Surgeon: MaKaylyn LimMD;  Location: [facility];  Service: General;  Laterality: Right;   TONSILLECTOMY  1960   Family History:  Family History  Problem Relation Age of Onset   Cancer Mother        breast   Intracerebral hemorrhage Father    Family Psychiatric  History:  Social History:  Social History   Substance and Sexual Activity  Alcohol Use Yes   Comment: 2 wine daily     Social History   Substance and Sexual Activity  Drug Use Yes   Types: Marijuana   Comment: weekend use    Social History   Socioeconomic History   Marital status: Married    Spouse name: Not on file   Number of children: 2   Years of education: Not on file   Highest education level: Professional school degree (e.g., MD, DDS, DVM, JD)  Occupational History   Occupation: psychologist  Tobacco Use   Smoking status: Never   Smokeless tobacco: Never  Vaping Use   Vaping Use: Never used  Substance and Sexual Activity   Alcohol use: Yes    Comment: 2 wine daily   Drug use: Yes    Types: Marijuana    Comment: weekend use   Sexual activity: Yes  Other Topics Concern   Not on file  Social History Narrative   Lives with wife and son in a 3 story home.  His daughter passed away from drug overdose.  He is a self employed Investment banker, operational.     Social Determinants of Health   Financial Resource Strain: Not on file  Food Insecurity: No Food Insecurity (08/20/2022)   Hunger Vital Sign    Worried About Running Out of Food in the Last Year: Never true    Ran Out of Food in the Last Year: Never true  Transportation Needs: No Transportation Needs (08/20/2022)   PRAPARE - Hydrologist (Medical): No    Lack of Transportation (Non-Medical): No  Physical Activity: Not on file  Stress: Not on file  Social Connections: Not on file   Additional Social History:    Allergies:   Allergies  Allergen Reactions   Duloxetine Hcl Other (See Comments)    Manic emotions   Gabapentin Other (See Comments)    Unable to  urinate, drowsiness   Trelegy Ellipta [Fluticasone-Umeclidin-Vilant] Other (See Comments)    Dry cracked lips and mouth   Trileptal [Oxcarbazepine] Other (See Comments)    Bad taste in his mouth    Labs:  Results for orders placed or performed during the hospital encounter of 10/19/22 (from the past 48 hour(s))  Troponin I (High Sensitivity)     Status: Abnormal   Collection Time: 10/19/22  5:50 PM  Result Value Ref Range   Troponin I (High Sensitivity) 22 (H) <18 ng/L    Comment: (NOTE) Elevated high sensitivity troponin I (hsTnI) values and significant  changes across serial measurements may suggest ACS but many other  chronic and acute conditions are known to elevate hsTnI results.  Refer to the "Links" section for chest pain algorithms and additional  guidance. Performed at [facility], [place] [address] [place], [place] [address]   Troponin I (High Sensitivity)     Status: Abnormal   Collection Time: 10/19/22  9:28 PM  Result Value Ref Range   Troponin I (High Sensitivity) 29 (H) <18 ng/L    Comment: (NOTE) Elevated high sensitivity troponin I (hsTnI) values and significant  changes across serial measurements may suggest ACS but many other  chronic and acute conditions are known to elevate hsTnI results.  Refer to the "Links" section for chest pain algorithms and additional  guidance. Performed at [facility], [place] [address], [place], [place] [address]   CBC with Differential     Status: None   Collection Time: 10/19/22 11:24 PM  Result Value Ref Range   WBC 8.9 4.0 - 10.5 K/uL   RBC 4.76 4.22 - 5.81 MIL/uL   Hemoglobin 15.1 13.0 - 17.0 g/dL   HCT 42.7 39.0 - 52.0 %   MCV 89.7 80.0 - 100.0 fL   MCH 31.7 26.0 - 34.0 pg   MCHC 35.4 30.0 - 36.0 g/dL   RDW 12.5 11.5 - 15.5 %   Platelets 211 150 - 400 K/uL   nRBC 0.0 0.0 - 0.2 %   Neutrophils Relative % 67 %   Neutro Abs 5.9 1.7 - 7.7 K/uL   Lymphocytes Relative  22 %   Lymphs  Abs 1.9 0.7 - 4.0 K/uL   Monocytes Relative 11 %   Monocytes Absolute 1.0 0.1 - 1.0 K/uL   Eosinophils Relative 0 %   Eosinophils Absolute 0.0 0.0 - 0.5 K/uL   Basophils Relative 0 %   Basophils Absolute 0.0 0.0 - 0.1 K/uL   Immature Granulocytes 0 %   Abs Immature Granulocytes 0.03 0.00 - 0.07 K/uL    Comment: Performed at [facility], [place] [address], [place], [place] [id number]  Basic metabolic panel     Status: Abnormal   Collection Time: 10/19/22 11:24 PM  Result Value Ref Range   Sodium 127 (L) 135 - 145 mmol/L   Potassium 3.7 3.5 - 5.1 mmol/L   Chloride 95 (L) 98 - 111 mmol/L   CO2 22 22 - 32 mmol/L   Glucose, Bld 111 (H) 70 - 99 mg/dL    Comment: Glucose reference range applies only to samples taken after fasting for at least 8 hours.   BUN 22 8 - 23 mg/dL   Creatinine, Ser 0.91 0.61 - 1.24 mg/dL   Calcium 9.5 8.9 - 10.3 mg/dL   GFR, Estimated >60 >60 mL/min    Comment: (NOTE) Calculated using the CKD-EPI Creatinine Equation (2021)    Anion gap 10 5 - 15    Comment: Performed at [facility], [place] [address], [place], [place] [address]  D-dimer, quantitative     Status: Abnormal   Collection Time: 10/19/22 11:24 PM  Result Value Ref Range   D-Dimer, Quant 1.54 (H) 0.00 - 0.50 ug/mL-FEU    Comment: (NOTE) At the manufacturer cut-off value of 0.5 g/mL FEU, this assay has a negative predictive value of 95-100%.This assay is intended for use in conjunction with a clinical pretest probability (PTP) assessment model to exclude pulmonary embolism (PE) and deep venous thrombosis (DVT) in outpatients suspected of PE or DVT. Results should be correlated with clinical presentation. Performed at [facility], [place] [address] [place], [place] [address]     Current Facility-Administered Medications  Medication Dose Route Frequency Provider Last Rate Last Admin   0.9 %  sodium chloride infusion   Intravenous Continuous  Mansy, Arvella Merles, MD   Held at 10/20/22 0100   acetaminophen (TYLENOL) tablet 650 mg  650 mg Oral Q4H PRN Mansy, Jan A, MD       ALPRAZolam Duanne Moron) tablet 0.25 mg  0.25 mg Oral BID PRN Mansy, Jan A, MD       amLODipine (NORVASC) tablet 5 mg  5 mg Oral Daily Mansy, Jan A, MD   5 mg at 10/20/22 1027   arformoterol (BROVANA) nebulizer solution 15 mcg  15 mcg Nebulization BID Erick Colace, NP       aspirin EC tablet 81 mg  81 mg Oral Daily Mansy, Jan A, MD   81 mg at 10/20/22 1027   atorvastatin (LIPITOR) tablet 10 mg  10 mg Oral Once per day on Mon Wed Fri Mansy, Jan A, MD   10 mg at 10/20/22 0855   budesonide (PULMICORT) nebulizer solution 0.5 mg  0.5 mg Nebulization BID Erick Colace, NP       busPIRone (BUSPAR) tablet 20 mg  20 mg Oral TID Mansy, Jan A, MD       cholecalciferol (VITAMIN D3) 25 MCG (1000 UNIT) tablet 5,000 Units  5,000 Units Oral Daily Mansy, Jan A, MD   5,000 Units at 10/20/22 1026  dicyclomine (BENTYL) tablet 20 mg  20 mg Oral TID PRN Mansy, Jan A, MD       docusate sodium (COLACE) capsule 100 mg  100 mg Oral Q12H Mansy, Jan A, MD   100 mg at 10/20/22 0319   enoxaparin (LOVENOX) injection 40 mg  40 mg Subcutaneous Q24H Mansy, Jan A, MD   40 mg at 10/20/22 1026   LORazepam (ATIVAN) injection 0.5 mg  0.5 mg Intravenous Q4H PRN Mansy, Jan A, MD   0.5 mg at 10/20/22 0309   LORazepam (ATIVAN) tablet 1 mg  1 mg Oral BID Mansy, Jan A, MD   1 mg at 10/20/22 1027   losartan (COZAAR) tablet 100 mg  100 mg Oral Daily Mansy, Jan A, MD       magnesium hydroxide (MILK OF MAGNESIA) suspension 30 mL  30 mL Oral Daily PRN Mansy, Jan A, MD       mirtazapine (REMERON) tablet 30 mg  30 mg Oral QHS Mansy, Jan A, MD   30 mg at 10/20/22 0319   morphine (PF) 2 MG/ML injection 2 mg  2 mg Intravenous Q2H PRN Mansy, Jan A, MD   2 mg at 10/20/22 0310   multivitamin with minerals tablet 1 tablet  1 tablet Oral Daily Mansy, Jan A, MD   1 tablet at 10/20/22 1027   nitroGLYCERIN (NITROSTAT) SL tablet 0.4  mg  0.4 mg Sublingual Q5 min PRN Mansy, Jan A, MD       omega-3 acid ethyl esters (LOVAZA) capsule 1 g  1 capsule Oral Daily Mansy, Jan A, MD       ondansetron [facility]) injection 4 mg  4 mg Intravenous Q6H PRN Mansy, Jan A, MD       polyethylene glycol (MIRALAX / GLYCOLAX) packet 17 g  17 g Oral BID Sheikh, Omair [place], DO   17 g at 10/20/22 1027   pregabalin (LYRICA) capsule 75 mg  75 mg Oral BID Mansy, Jan A, MD   75 mg at 10/20/22 0319   senna-docusate (Senokot-S) tablet 1 tablet  1 tablet Oral QHS PRN Mansy, Jan A, MD       traZODone (DESYREL) tablet 25 mg  25 mg Oral QHS PRN Mansy, Jan A, MD       traZODone (DESYREL) tablet 50 mg  50 mg Oral QHS Mansy, Jan A, MD   50 mg at 10/20/22 0310   zinc sulfate capsule 220 mg  220 mg Oral Daily Eudelia Bunch, RPH       zolpidem (AMBIEN) tablet 5 mg  5 mg Oral QHS PRN Mansy, Jan A, MD   5 mg at 10/20/22 0310    Musculoskeletal:  Patient observed resting in bed   Psychiatric Specialty Exam:  Presentation  General Appearance: Casual  Eye Contact:Good  Speech:Clear and Coherent  Speech Volume:Normal  Handedness:Right   Mood and Affect  Mood:Anxious  Affect:Congruent   Thought Process  Thought Processes:Coherent  Descriptions of Associations:Intact  Orientation:Full (Time, Place and Person)  Thought Content:Obsessions  History of Schizophrenia/Schizoaffective disorder:No data recorded Duration of Psychotic Symptoms:No data recorded Hallucinations:Hallucinations: None  Ideas of Reference:None  Suicidal Thoughts:Suicidal Thoughts: Yes, Passive SI Passive Intent and/or Plan: Without Intent; Without Plan  Homicidal Thoughts:Homicidal Thoughts: No   Sensorium  Memory:Immediate Fair; Remote Good  Judgment:Fair  Insight:Fair   Executive Functions  Concentration:Fair  Attention Span:Fair  [place]  Language:Good   Psychomotor Activity  Psychomotor Activity:Psychomotor  Activity: Normal   Assets  Assets:Social Support   Sleep  Sleep:Sleep: Fair   Physical Exam: Physical Exam Vitals and nursing note reviewed.  Cardiovascular:     Rate and Rhythm: Normal rate and regular rhythm.  Psychiatric:        Mood and Affect: Mood normal.        Behavior: Behavior normal.        Thought Content: Thought content normal.    Review of Systems  Cardiovascular:  Positive for chest pain.  Psychiatric/Behavioral:  Positive for depression. Negative for suicidal ideas (passive ideations). The patient is nervous/anxious.   All other systems reviewed and are negative.  Blood pressure 133/88, pulse 62, temperature 98 F (36.7 C), temperature source Oral, resp. rate 20, height 5' 9"$  (1.753 m), weight 60 kg, SpO2 100 %. Body mass index is 19.53 kg/m.  Treatment Plan Summary: Daily contact with patient to assess and evaluate symptoms and progress in treatment and Medication management  -Consider increasing  Remeron 30 mg nightly to Remeron 45 mg nighty , Lyrica 75 mg p.o. twice daily, Ativan 1 mg p.o. twice daily, BuSpar 20 mg p.o. 3 times daily, -Feel free to reconsult psychiatry as needed.   -Labs reviewed at this time. Troponin slightly elevated at 29., D-Dimer elevated at 1.54, UDS positive for Bzd. CT head from 02/03 normal no acute findings.   Disposition: No evidence of imminent risk to self or others at present.   Patient does not meet criteria for psychiatric inpatient admission. Supportive therapy provided about ongoing stressors. Discussed crisis plan, support from social network, calling 911, coming to the Emergency Department, and calling Suicide Hotline.  Derrill [place], NP 10/20/2022 2:58 PM

## 2022-10-20 NOTE — H&P (Signed)
[place]   PATIENT NAMEMarcellino [name]    MR#:  [id number]  DATE OF BIRTH:  [birth year]  DATE OF ADMISSION:  10/19/2022  PRIMARY CARE PHYSICIAN: Patient, No Pcp Per   Patient is coming from: Home  REQUESTING/REFERRING PHYSICIAN: Deno Etienne, DO  CHIEF COMPLAINT:   Chief Complaint  Patient presents with   Something feels different in his chest    HISTORY OF PRESENT ILLNESS:  Brad [name] is a 67 y.o. Caucasian male with medical history significant for GERD, hypertension, MVP and DJD, who presented to the emergency room with acute onset of substernal chest pain felt like a burning sensation with no radiation or dyspnea or palpitations.  He denies any associated nausea or vomiting or diaphoresis.  It has not been exertional.  He denies any recent leg edema or pain or recent travels or surgeries.  No headache or dizziness or blurred vision.  No presyncope or syncope.  No bleeding diathesis.  No dysuria, oliguria or hematuria or flank pain.  He has been having occasional nausea without vomiting and with occasional diarrhea.  He admitted to occasional dry cough and dyspnea.  He had a herniorrhaphy in December.  ED Course: Upon presentation to the emergency room BP was 168/96 and later 181/102 with respiratory rate of 32 and otherwise normal vital signs.  Labs revealed hyponatremia 127 and hypochloremia of 95.  Sodium was 133 a couple days ago.  CBC was within normal.  D-dimer was 1.54. EKG as reviewed by me :  EKG showed sinus rhythm with rate of 73 with PACs and incomplete right bundle branch block, with left anterior fascicular block. Imaging: Portable chest x-ray showed air distended bowel beneath the right hemidiaphragm with no active cardiopulmonary disease.  The patient was given 2 L bolus of IV normal saline, GI cocktail and 1 g p.o. Tylenol.  He will be admitted to a telemetry observation bed for further evaluation and management. PAST MEDICAL HISTORY:   Past Medical History:   Diagnosis Date   Allergy    Anxiety    Depression    DJD (degenerative joint disease), cervical    postiton with pillow under knees, cant turn neck    Dysentery, amebic, acute 1981   GERD (gastroesophageal reflux disease)    H/O bronchitis    H/O malaria 1984   Hearing loss    bilateral    Hypertension    labile Blood pressure   Inguinal hernia    Insomnia    early morning awakening   MVP (mitral valve prolapse)    "no problems"   Perianal pain    Personal history of colonic polyps    Tinnitus    right ear    PAST SURGICAL HISTORY:   Past Surgical History:  Procedure Laterality Date   CARPAL TUNNEL RELEASE  10/06, 5/10   right wrist    CARPAL TUNNEL RELEASE  3/10   left wrist    cervical spine discectomy   09/2005   COLONOSCOPY WITH PROPOFOL N/A 10/21/2015   Procedure: COLONOSCOPY WITH PROPOFOL;  Surgeon: [name], MD;  Location: [facility];  Service: Endoscopy;  Laterality: N/A;   INGUINAL HERNIA REPAIR  08/16/2011   Procedure: HERNIA REPAIR INGUINAL ADULT;  Surgeon: [name], MD;  Location: [place];  Service: General;  Laterality: Left;   INGUINAL HERNIA REPAIR Right 05/03/2014   Procedure: OPEN RIGHT INGUINAL HERNIA REPAIR WITH MESH;  Surgeon: [name], MD;  Location:  [facility];  Service: General;  Laterality: Right;   INGUINAL HERNIA REPAIR Left 08/20/2022   Procedure: HERNIA REPAIR INGUINAL ADULT;  Surgeon: [name], MD;  Location: [facility];  Service: General;  Laterality: Left;   INSERTION OF MESH Right 05/03/2014   Procedure: INSERTION OF MESH;  Surgeon: [name], MD;  Location: [facility];  Service: General;  Laterality: Right;   TONSILLECTOMY  1960    SOCIAL HISTORY:   Social History   Tobacco Use   Smoking status: Never   Smokeless tobacco: Never  Substance Use Topics   Alcohol use: Yes    Comment: 2 wine daily    FAMILY HISTORY:   Family History  Problem Relation Age of Onset   Cancer Mother        breast    Intracerebral hemorrhage Father     DRUG ALLERGIES:   Allergies  Allergen Reactions   Duloxetine Hcl Other (See Comments)   Gabapentin Other (See Comments)    Unable to urinate, drowsiness   Trileptal [Oxcarbazepine] Other (See Comments)    Bad taste in his mouth    REVIEW OF SYSTEMS:   ROS As per history of present illness. All pertinent systems were reviewed above. Constitutional, HEENT, cardiovascular, respiratory, GI, GU, musculoskeletal, neuro, psychiatric, endocrine, integumentary and hematologic systems were reviewed and are otherwise negative/unremarkable except for positive findings mentioned above in the HPI.   MEDICATIONS AT HOME:   Prior to Admission medications   Medication Sig Start Date End Date Taking? Authorizing Provider  Alpha-Lipoic Acid 600 MG CAPS Take 1 capsule by mouth daily.    [provider]  amLODipine (NORVASC) 5 MG tablet Take 2 tablets (10 mg total) by mouth daily. Patient taking differently: Take 5 mg by mouth daily. 03/02/19   Johnn Hai, MD  atorvastatin (LIPITOR) 10 MG tablet Take 10 mg by mouth 3 (three) times a week. MWF 08/10/19   [provider]  busPIRone (BUSPAR) 10 MG tablet Take 20 mg by mouth 3 (three) times daily.     [provider]  Cholecalciferol (VITAMIN D3) 125 MCG (5000 UT) TABS Take 1 tablet by mouth daily.    [provider]  Coenzyme Q10 50 MG CAPS Take 1 capsule by mouth daily.    [provider]  dicyclomine (BENTYL) 20 MG tablet Take 1 tablet (20 mg total) by mouth 3 (three) times daily as needed for spasms. 10/13/22   Long, Wonda Olds, MD  docusate sodium (COLACE) 100 MG capsule Take 1 capsule (100 mg total) by mouth every 12 (twelve) hours. 09/26/22   Valarie Merino, MD  LORazepam (ATIVAN) 1 MG tablet Take 1 mg by mouth 2 (two) times daily. 10/26/18   [provider]  losartan (COZAAR) 100 MG tablet Take 100 mg by mouth daily. 02/04/20   [provider]  memantine  (NAMENDA) 10 MG tablet Take 1 tablet (10 mg total) by mouth 2 (two) times daily. Patient not taking: Reported on 08/20/2022 03/02/19   Johnn Hai, MD  Methylfol-Methylcob-Acetylcyst (CEREFOLIN NAC) 6-2-600 MG TABS 1 a day Patient not taking: Reported on 08/20/2022 03/02/19   Johnn Hai, MD  mirtazapine (REMERON) 30 MG tablet Take 30 mg by mouth at bedtime.    [provider]  Multiple Vitamin (MULTIVITAMIN) tablet Take 1 tablet by mouth daily.    [provider]  Omega-3 1000 MG CAPS Take 1 capsule by mouth daily.    [provider]  oxyCODONE (OXY IR/ROXICODONE) 5 MG immediate  release tablet Take 1 tablet (5 mg total) by mouth every 6 (six) hours as needed for severe pain or breakthrough pain. Patient not taking: Reported on 09/30/2022 08/23/22   Michael Boston, MD  polyethylene glycol (MIRALAX) 17 g packet Take 17 g by mouth daily. 08/26/22   Rancour, Annie Main, MD  pregabalin (LYRICA) 75 MG capsule Take 75 mg by mouth 2 (two) times daily.    [provider]  Prenatal Vit-Fe Fumarate-FA (PRENATAL MULTIVITAMIN) TABS tablet Take 1 tablet by mouth daily. Patient not taking: Reported on 08/20/2022 03/03/19   Johnn Hai, MD  senna-docusate (SENOKOT-S) 8.6-50 MG tablet Take 1 tablet by mouth at bedtime as needed for mild constipation. 10/13/22   Long, Wonda Olds, MD  traZODone (DESYREL) 50 MG tablet Take 50 mg by mouth at bedtime.    [provider]  zaleplon (SONATA) 10 MG capsule Take 10 mg by mouth at bedtime as needed for sleep.    [provider]  zinc gluconate 50 MG tablet Take 50 mg by mouth daily.    [provider]  zolpidem (AMBIEN CR) 12.5 MG CR tablet Take 12.5 mg by mouth at bedtime.     [provider]      VITAL SIGNS:  Blood pressure (!) 181/103, pulse 83, temperature 98.1 F (36.7 C), temperature source Oral, resp. rate 19, height 5' 9"$  (1.753 m), weight 60 kg, SpO2 100 %.  PHYSICAL EXAMINATION:  Physical  Exam  GENERAL:  67 y.o.-year-old Caucasian male patient lying in the bed with no acute distress however he wants fairly anxious.  Complaining about his right antecubital IV that the fluids are going to his throat. EYES: Pupils equal, round, reactive to light and accommodation. No scleral icterus. Extraocular muscles intact.  HEENT: Head atraumatic, normocephalic. Oropharynx and nasopharynx clear.  NECK:  Supple, no jugular venous distention. No thyroid enlargement, no tenderness.  LUNGS: Normal breath sounds bilaterally, no wheezing, rales,rhonchi or crepitation. No use of accessory muscles of respiration.  CARDIOVASCULAR: Regular rate and rhythm, S1, S2 normal. No murmurs, rubs, or gallops.  ABDOMEN: Soft, nondistended, nontender. Bowel sounds present. No organomegaly or mass.  EXTREMITIES: No pedal edema, cyanosis, or clubbing.  NEUROLOGIC: Cranial nerves II through XII are intact. Muscle strength 5/5 in all extremities. Sensation intact. Gait not checked.  PSYCHIATRIC: The patient is alert and oriented x 3.  Normal affect and good eye contact. SKIN: No obvious rash, lesion, or ulcer.   LABORATORY PANEL:   CBC Recent Labs  Lab 10/19/22 2324  WBC 8.9  HGB 15.1  HCT 42.7  PLT 211   ------------------------------------------------------------------------------------------------------------------  Chemistries  Recent Labs  Lab 10/17/22 1508 10/19/22 2324  NA 133* 127*  K 3.5 3.7  CL 101 95*  CO2 22 22  GLUCOSE 106* 111*  BUN 16 22  CREATININE 0.69 0.91  CALCIUM 9.3 9.5  AST 25  --   ALT 23  --   ALKPHOS 63  --   BILITOT 0.9  --    ------------------------------------------------------------------------------------------------------------------  Cardiac Enzymes No results for input(s): "TROPONINI" in the last 168 hours. ------------------------------------------------------------------------------------------------------------------  RADIOLOGY:  CT Angio Chest PE W  and/or Wo Contrast  Result Date: 10/20/2022 CLINICAL DATA:  Pulmonary embolism suspected, high probability. Elevated D-dimer. EXAM: CT ANGIOGRAPHY CHEST WITH CONTRAST TECHNIQUE: Multidetector CT imaging of the chest was performed using the standard protocol during bolus administration of intravenous contrast. Multiplanar CT image reconstructions and MIPs were obtained to evaluate the vascular anatomy. RADIATION DOSE REDUCTION: This exam  was performed according to the departmental dose-optimization program which includes automated exposure control, adjustment of the mA and/or kV according to patient size and/or use of iterative reconstruction technique. CONTRAST:  10m OMNIPAQUE IOHEXOL 350 MG/ML SOLN COMPARISON:  Portable chest yesterday, PA Lat 10/11/2022, CTA chest 08/26/2022 FINDINGS: Cardiovascular: Mild panchamber cardiomegaly is again noted. There is no venous dilatation. The pulmonary arteries are normal in caliber without visible emboli. There is aortic atherosclerosis and scattered calcification in the LAD and circumflex coronary arteries. The aortic root at the sinuses of Valsalva is slightly dilated at 4.0 cm. The remaining aorta is within normal caliber limits. No aortic dissection or stenosis is seen. The great vessels are not opacified but are normal caliber. Mediastinum/Nodes: No enlarged mediastinal, hilar, or axillary lymph nodes. Thyroid gland, trachea, and esophagus demonstrate no significant findings. Lungs/Pleura: There is mild breathing motion artifact through the bases. No pulmonary infiltrate or nodule is seen. No pleural effusion or pneumothorax. Upper Abdomen: No acute abnormality. Musculoskeletal: There is mild thoracic kyphodextroscoliosis and degenerative changes. Multilevel lower cervical fusion plating is partially visible, extending to and including T1. Mild osteopenia. No suspicious bone lesions or chest wall abnormality. Review of the MIP images confirms the above findings.  IMPRESSION: 1. No acute chest CT or CTA findings. 2. Aortic and coronary artery atherosclerosis. 3. Mild cardiomegaly. 4. Slightly dilated aortic root at the sinuses of Valsalva measuring 4 cm. Recommend annual imaging followup by CTA or MRA. This recommendation follows 2010 ACCF/AHA/AATS/ACR/ASA/SCA/SCAI/SIR/STS/SVM Guidelines for the Diagnosis and Management of Patients with Thoracic Aortic Disease. Circulation. 2010; 121:[id number] Aortic aneurysm NOS (ICD10-I71.9) 5. Kyphodextroscoliosis, degenerative and postsurgical changes and osteopenia. Electronically Signed   By: KTelford NabM.D.   On: 10/20/2022 00:38   DG CHEST PORT 1 VIEW  Result Date: 10/19/2022 CLINICAL DATA:  Chest pain EXAM: PORTABLE CHEST 1 VIEW COMPARISON:  10/11/2022 FINDINGS: No acute airspace disease or effusion. Normal cardiomediastinal silhouette. No pneumothorax. Air distended bowel beneath the right hemidiaphragm. IMPRESSION: No active disease. Air distended bowel beneath the right hemidiaphragm. Electronically Signed   By: KDonavan FoilM.D.   On: 10/19/2022 18:06      IMPRESSION AND PLAN:  Assessment and Plan: * Chest pain - The patient will be admitted to an observation cardiac telemetry bed. - Will follow serial troponins and EKGs. - The patient will be placed on aspirin as well as p.r.n. sublingual nitroglycerin and morphine sulfate for pain. - We will obtain a cardiology consult in a.m. for further cardiac risk stratification. - I notified PGay Fillerabout the patient.   Dyslipidemia - We will continue statin therapy and Lovaza.  Anxiety and depression - We will continue BuSpar, Xanax, trazodone and Remeron. - He may benefit from psychiatry consult that can be called in a.m.  Essential hypertension - We will continue his antihypertensives.  Peripheral neuropathy - We will continue Lyrica.   DVT prophylaxis: Lovenox.  Advanced Care Planning:  Code Status: full code.  Family Communication:  The  plan of care was discussed in details with the patient (and family). I answered all questions. The patient agreed to proceed with the above mentioned plan. Further management will depend upon hospital course. Disposition Plan: Back to previous home environment Consults called: Cardiology consult requested. All the records are reviewed and case discussed with ED provider.  Status is: Observation  I certify that at the time of admission, it is my clinical judgment that the patient will require hospital care extending less than 2 midnights.  Dispo: The patient is from: Home              Anticipated d/c is to: Home              Patient currently is not medically stable to d/c.              Difficult to place patient: No  Christel Mormon M.D on 10/20/2022 at 3:11 AM  [facility]   From 7 PM-7 AM, contact night-coverage [email or web address]  CC: Primary care physician; Patient, No Pcp Per

## 2022-10-20 NOTE — ED Notes (Signed)
Pt resting in room at this time. Communicating with staff and using call bell. Pt is still fixated on IV and bruising on arms, states that they are from previous Ivs and keeps asking if its ok. Pt reassured about IV site.

## 2022-10-20 NOTE — ED Notes (Signed)
Pt refuses to keep monitoring equipment on despite multiple attempts explaining to pt why the items are necessary for his care.

## 2022-10-20 NOTE — Assessment & Plan Note (Signed)
-   We will continue his antihypertensives. 

## 2022-10-20 NOTE — Progress Notes (Signed)
RUE venous duplex has been completed.     Results can be found under chart review under CV PROC. 10/20/2022 4:17 PM Brooksie Ellwanger RVT, RDMS

## 2022-10-20 NOTE — Progress Notes (Signed)
Care started prior to midnight in the emergency room patient was admitted early this morning after midnight by Dr. Eugenie Norrie current agreement with his assessment and plan.  Additional changes to the plan of care been made accordingly.  The patient is a 67 year old Caucasian male with a past medical history significant for presumptive GERD, hypertension, MVP and DJD as well as other comorbidities who has been here multiple times in the last 3 months with multiple ED visits who complained of chest discomfort and burning with no radiation dyspnea palpitations.  Patient believes that his symptoms are related to his HVAC system and fiberglass particles being inhaled.  He denies any nausea or vomiting he has multiple somatic complaints and complains of constipation, hernia pain as well as pain from his IV site and bruising.  He had chest discomfort and cardiology was consulted and troponins were flat with mildly elevated and CT PE scan of the chest was done which showed " No acute chest CT or CTA findings. Aortic and coronary artery atherosclerosis. Mild cardiomegaly. Slightly dilated aortic root at the sinuses of Valsalva measuring 4 cm. Recommend annual imaging followup by CTA or MRA. Kyphodextroscoliosis, degenerative and postsurgical changes and osteopenia."  Given this chest discomfort cardiology was consulted.  Patient was extremely anxious and multiple ED visits and given his history of anxiety depression psychiatry was consulted and recommended medication changes.  Patient was complaining of a left-sided inguinal hernia so general surgery was also consulted and he was noted to have an appropriate stage of healing for his open inguinal hernia repair and had a recent postop CT scan a week ago.  The surgeon recommended continue icing and heating and analgesia as needed and recommended aggressive bowel regimen for chronic constipation.  Given his continued dyspnea pulmonary is consulted and they recommended  bronchodilators and inhaled steroids and send a hypersensitivity panel and recommending avoiding exposure to fiberglass.  Echocardiogram was recommended by cardiology for chest discomfort is still pending read.  Patient was also complaining of some right arm pain and discomfort where his IV was so vascular ultrasound was done and showed "No evidence of deep vein thrombosis in the upper extremity. Findings consistent with age indeterminate superficial vein thrombosis involving the right cephalic vein."   Currently awaiting his echo results given that multiple specialists are involved for his multiple somatic complaints.  Assessment and Plan: * Chest pain - The patient will be admitted to an observation cardiac telemetry bed. - Will follow serial troponins and EKGs. - The patient will be placed on aspirin as well as p.r.n. sublingual nitroglycerin and morphine sulfate for pain. - We will obtain a cardiology consult in a.m. for further cardiac risk stratification and they recommended echocardiogram which is done and pending read Given that he continued be dyspneic on exertion pulmonary was consulted and we are sending off a hypersensitivity panel and recommending avoiding exposure and starting the patient on bronchodilators and inhaled steroids -Given that the exposure to the fiberglass was causing his symptoms it is recommended that he avoid this as best as he can  Constipation and inguinal hernia pain -General surgery consulted and feel that he is appropriate hearing and recommending an aggressive bowel regimen which has been initiated  Dyslipidemia - We will continue statin therapy and Lovaza.  Anxiety and depression - We will continue BuSpar, Xanax, trazodone and Remeron and have increased the Remeron per psychiatry recommendations - Psychiatry was consulted given his significant anxiety and multiple hospitalizations  Essential hypertension - We  will continue his  antihypertensives.  Peripheral neuropathy - We will continue Lyrica.  Hyponatremia -IV fluid hydration stopped given the patient refused his IV and removed given the pain -Na+ Trend: Recent Labs  Lab 09/29/22 2328 10/02/22 0936 10/03/22 2311 10/11/22 1644 10/13/22 1410 10/17/22 1508 10/19/22 2324  NA 130* 131* 134* 130* 130* 133* 127*  -Continue to monitor and trend and repeat CMP in a.m.  Right arm pain and bruising -Check vascular ultrasound showed no evidence of acute DVT but did show an age-indeterminate superficial venous clot -Soft tissue ultrasound still pending  Will continue to monitor patient's clinical sponsor intervention and follow-up on the echocardiogram and follow-up on specialist recommendations and anticipating discharge in next 24 to 48 hours.

## 2022-10-20 NOTE — Progress Notes (Signed)
Subjective: CC: Asked to see for pain at prior left inguinal hernia site. Chaperone present for entirety of visit, patients RN - Reina Fuse.   Hx of open left inguinal hernia repair by Dr. Georgette Dover on 08/20/22. Appears he has a hx of chronic constipation that he was referred to with GI for. Was seen by Dr. Hassell Done in the office on 09/08/22 and was recommended quart of Gatorade w/ 4 scoops of MiraLAX to help with constipation. Has been seen in the ED multiple times with complaints of pain over the left inguinal hernia repair. Had a CT scan A/P done on 2/14 that showed no acute findings or complications related to his surgery. Also had scrotal US that showed no evidence of torsion; L > R hydrocele. He has had additional post operative CT A/P including on 08/26/22, 08/30/22, 09/10/22, and 09/26/22  He is here now with chest pain being seen by cardiology as well as anxiety for which psych is seeing him for. Reports ongoing issues with constipation where he has to strain significantly to have a bm. When he does this, it causes him pain at the left inguinal hernia repair site. Had small formed bm along with some liquid stool since admission. Passing flatus. Tolerating solid diet without n/v. He does not report abdominal pain. Difficult to narrow down exactly what he takes for bowel regimen at home but it sounds like 1 scoop of miralax daily.   Here is afebrile without tachycardia or hypotension. WBC 8.9.   Objective: Vital signs in last 24 hours: Temp:  [97.7 F (36.5 C)-98.8 F (37.1 C)] 97.7 F (36.5 C) (02/21 1101) Pulse Rate:  [65-105] 65 (02/21 1101) Resp:  [14-32] 20 (02/21 1101) BP: (98-181)/(64-114) 126/82 (02/21 1101) SpO2:  [96 %-100 %] 100 % (02/21 1101) Weight:  [60 kg] 60 kg (02/20 1740)    Intake/Output from previous day: No intake/output data recorded. Intake/Output this shift: No intake/output data recorded.  PE:. Chaperone present for entirety of visit, patients RN - Reina Fuse.   Gen:  Alert, NAD, pleasant Abd: Soft, ND, NT, +BS, left inguinal repair incision appears completley healed without drainage, bleeding, or signs of infection. The left inguinal region and right inguinal region appear symmetric. I do not see any obvious swelling or palpate and obvious recurrence of a left inguinal hernia. No obvious scrotal swelling.   Lab Results:  Recent Labs    10/17/22 1508 10/19/22 2324  WBC 5.8 8.9  HGB 13.9 15.1  HCT 39.9 42.7  PLT 192 211   BMET Recent Labs    10/17/22 1508 10/19/22 2324  NA 133* 127*  K 3.5 3.7  CL 101 95*  CO2 22 22  GLUCOSE 106* 111*  BUN 16 22  CREATININE 0.69 0.91  CALCIUM 9.3 9.5   PT/INR No results for input(s): "LABPROT", "INR" in the last 72 hours. CMP     Component Value Date/Time   NA 127 (L) 10/19/2022 2324   K 3.7 10/19/2022 2324   CL 95 (L) 10/19/2022 2324   CO2 22 10/19/2022 2324   GLUCOSE 111 (H) 10/19/2022 2324   BUN 22 10/19/2022 2324   CREATININE 0.91 10/19/2022 2324   CALCIUM 9.5 10/19/2022 2324   PROT 7.2 10/17/2022 1508   ALBUMIN 4.9 10/17/2022 1508   AST 25 10/17/2022 1508   ALT 23 10/17/2022 1508   ALKPHOS 63 10/17/2022 1508   BILITOT 0.9 10/17/2022 1508   GFRNONAA >60 10/19/2022 2324   GFRAA >60  03/01/2019 1816   Lipase     Component Value Date/Time   LIPASE 45 10/17/2022 1508    Studies/Results: CT Angio Chest PE W and/or Wo Contrast  Result Date: 10/20/2022 CLINICAL DATA:  Pulmonary embolism suspected, high probability. Elevated D-dimer. EXAM: CT ANGIOGRAPHY CHEST WITH CONTRAST TECHNIQUE: Multidetector CT imaging of the chest was performed using the standard protocol during bolus administration of intravenous contrast. Multiplanar CT image reconstructions and MIPs were obtained to evaluate the vascular anatomy. RADIATION DOSE REDUCTION: This exam was performed according to the departmental dose-optimization program which includes automated exposure control, adjustment of the mA and/or kV  according to patient size and/or use of iterative reconstruction technique. CONTRAST:  39m OMNIPAQUE IOHEXOL 350 MG/ML SOLN COMPARISON:  Portable chest yesterday, PA Lat 10/11/2022, CTA chest 08/26/2022 FINDINGS: Cardiovascular: Mild panchamber cardiomegaly is again noted. There is no venous dilatation. The pulmonary arteries are normal in caliber without visible emboli. There is aortic atherosclerosis and scattered calcification in the LAD and circumflex coronary arteries. The aortic root at the sinuses of Valsalva is slightly dilated at 4.0 cm. The remaining aorta is within normal caliber limits. No aortic dissection or stenosis is seen. The great vessels are not opacified but are normal caliber. Mediastinum/Nodes: No enlarged mediastinal, hilar, or axillary lymph nodes. Thyroid gland, trachea, and esophagus demonstrate no significant findings. Lungs/Pleura: There is mild breathing motion artifact through the bases. No pulmonary infiltrate or nodule is seen. No pleural effusion or pneumothorax. Upper Abdomen: No acute abnormality. Musculoskeletal: There is mild thoracic kyphodextroscoliosis and degenerative changes. Multilevel lower cervical fusion plating is partially visible, extending to and including T1. Mild osteopenia. No suspicious bone lesions or chest wall abnormality. Review of the MIP images confirms the above findings. IMPRESSION: 1. No acute chest CT or CTA findings. 2. Aortic and coronary artery atherosclerosis. 3. Mild cardiomegaly. 4. Slightly dilated aortic root at the sinuses of Valsalva measuring 4 cm. Recommend annual imaging followup by CTA or MRA. This recommendation follows 2010 ACCF/AHA/AATS/ACR/ASA/SCA/SCAI/SIR/STS/SVM Guidelines for the Diagnosis and Management of Patients with Thoracic Aortic Disease. Circulation. 2010; 121:[id number] Aortic aneurysm NOS (ICD10-I71.9) 5. Kyphodextroscoliosis, degenerative and postsurgical changes and osteopenia. Electronically Signed   By: KTelford NabM.D.   On: 10/20/2022 00:38   DG CHEST PORT 1 VIEW  Result Date: 10/19/2022 CLINICAL DATA:  Chest pain EXAM: PORTABLE CHEST 1 VIEW COMPARISON:  10/11/2022 FINDINGS: No acute airspace disease or effusion. Normal cardiomediastinal silhouette. No pneumothorax. Air distended bowel beneath the right hemidiaphragm. IMPRESSION: No active disease. Air distended bowel beneath the right hemidiaphragm. Electronically Signed   By: KDonavan FoilM.D.   On: 10/19/2022 18:06    Anti-infectives: Anti-infectives (From admission, onward)    None        Assessment/Plan Hx of open left inguinal hernia repair by Dr. TGeorgette Doveron 08/20/22 - No indication for emergency surgery. Incision healing well without signs of infection. I do not palpate an obvious recurrence of his hernia on exam. Clinically non-obstructed (tolerating diet and having bm's) - CT scan A/P done on 2/14 reviewed by myself and my attending which showed no acute findings or complications related to his surgery. Also had scrotal [place] that showed no evidence of torsion; L > R hydrocele. He has had additional post operative CT A/P including on 08/26/22, 08/30/22, 09/10/22, and 09/26/22. I do not anticipate we will obtain any further imaging but will confirm with MD.  - Patient reports pain over hernia site is when he strains to have  BM. Appears he suffers from chronic constipation per notes. Fear if he continues to strain he will place himself at risk for another recurrence of his hernia. Recommend starting bowel regimen with BID colace and Miralax to start. Could consider mag citrate or milk of mag if needed. Agree with him following up with GI as outpatient. I don't see a colonoscopy on file. - Pending MD agree's for no further imaging, we will sign off. Patient can f/u with [place] as outpatient. I called and discussed this with [facility].     LOS: 0 days    Jillyn Ledger , [facility] 10/20/2022, 12:17 PM Please see Amion for  pager number during day hours 7:00am-4:30pm

## 2022-10-20 NOTE — Assessment & Plan Note (Addendum)
-   We will continue BuSpar, Xanax, trazodone and Remeron. - He may benefit from psychiatry consult that can be called in a.m.

## 2022-10-20 NOTE — Assessment & Plan Note (Signed)
-   We will continue Lyrica. 

## 2022-10-20 NOTE — ED Notes (Addendum)
Nurse called into room multiple times by pt, pt states that he thinks the IV fluids are going into his head and that it is a waste of time. When asked if pt would like to stay to get treatment or leave ama pt refuses to answer. Pt fixated on IV site, BP cuff, and monitoring. States "how could we do this". Pt is alert and oriented to person, place and situation. Overly anxious and fixated on objects/tasks. When pt is told about medication admin for sleep/anxiety. Pt states he never rests and has been without his meds at home for a least a few days if not longer. MD to be contacted and psych eval recommend. Pt is stable in room at this time

## 2022-10-20 NOTE — Consult Note (Signed)
  Initially patient was in the emergency department however was in a procedure when this provider attempted to follow-up with TTS consult.  Patient has been moved to 1427-01.   Placed a inpatient consult note: " history of anxiety and depression with excessive anxiety and psychosomatic symptoms."

## 2022-10-20 NOTE — Progress Notes (Addendum)
  Echocardiogram 2D Echocardiogram attempted at 1545. Pt requested he eat before we proceed with exam. Will attempt again at 1630.  Eartha Inch 10/20/2022, 3:58 PM

## 2022-10-20 NOTE — Assessment & Plan Note (Signed)
-   The patient will be admitted to an observation cardiac telemetry bed. - Will follow serial troponins and EKGs. - The patient will be placed on aspirin as well as p.r.n. sublingual nitroglycerin and morphine sulfate for pain. - We will obtain a cardiology consult in a.m. for further cardiac risk stratification. - I notified Patricia Trent about the patient. 

## 2022-10-21 ENCOUNTER — Telehealth: Payer: Self-pay | Admitting: Acute Care

## 2022-10-21 DIAGNOSIS — I10 Essential (primary) hypertension: Secondary | ICD-10-CM | POA: Diagnosis not present

## 2022-10-21 DIAGNOSIS — F419 Anxiety disorder, unspecified: Secondary | ICD-10-CM | POA: Diagnosis not present

## 2022-10-21 DIAGNOSIS — R079 Chest pain, unspecified: Secondary | ICD-10-CM | POA: Diagnosis not present

## 2022-10-21 DIAGNOSIS — F4323 Adjustment disorder with mixed anxiety and depressed mood: Secondary | ICD-10-CM

## 2022-10-21 DIAGNOSIS — E785 Hyperlipidemia, unspecified: Secondary | ICD-10-CM | POA: Diagnosis not present

## 2022-10-21 DIAGNOSIS — R0789 Other chest pain: Secondary | ICD-10-CM | POA: Diagnosis not present

## 2022-10-21 LAB — COMPREHENSIVE METABOLIC PANEL
ALT: 18 U/L (ref 0–44)
AST: 24 U/L (ref 15–41)
Albumin: 3.3 g/dL — ABNORMAL LOW (ref 3.5–5.0)
Alkaline Phosphatase: 44 U/L (ref 38–126)
Anion gap: 7 (ref 5–15)
BUN: 15 mg/dL (ref 8–23)
CO2: 24 mmol/L (ref 22–32)
Calcium: 8.7 mg/dL — ABNORMAL LOW (ref 8.9–10.3)
Chloride: 104 mmol/L (ref 98–111)
Creatinine, Ser: 0.81 mg/dL (ref 0.61–1.24)
GFR, Estimated: >60 mL/min (ref >60)
Glucose, Bld: 90 mg/dL (ref 70–99)
Potassium: 3.8 mmol/L (ref 3.5–5.1)
Sodium: 135 mmol/L (ref 135–145)
Total Bilirubin: 0.7 mg/dL (ref 0.3–1.2)
Total Protein: 5.1 g/dL — ABNORMAL LOW (ref 6.5–8.1)

## 2022-10-21 LAB — CBC WITH DIFFERENTIAL/PLATELET
Abs Immature Granulocytes: 0.01 10*3/uL (ref 0.00–0.07)
Basophils Absolute: 0 10*3/uL (ref 0.0–0.1)
Basophils Relative: 1 %
Eosinophils Absolute: 0.1 10*3/uL (ref 0.0–0.5)
Eosinophils Relative: 2 %
HCT: 35.3 % — ABNORMAL LOW (ref 39.0–52.0)
Hemoglobin: 11.8 g/dL — ABNORMAL LOW (ref 13.0–17.0)
Immature Granulocytes: 0 %
Lymphocytes Relative: 41 %
Lymphs Abs: 2.2 10*3/uL (ref 0.7–4.0)
MCH: 31.3 pg (ref 26.0–34.0)
MCHC: 33.4 g/dL (ref 30.0–36.0)
MCV: 93.6 fL (ref 80.0–100.0)
Monocytes Absolute: 0.5 10*3/uL (ref 0.1–1.0)
Monocytes Relative: 9 %
Neutro Abs: 2.5 10*3/uL (ref 1.7–7.7)
Neutrophils Relative %: 47 %
Platelets: 144 10*3/uL — ABNORMAL LOW (ref 150–400)
RBC: 3.77 MIL/uL — ABNORMAL LOW (ref 4.22–5.81)
RDW: 13.2 % (ref 11.5–15.5)
WBC: 5.2 10*3/uL (ref 4.0–10.5)
nRBC: 0 % (ref 0.0–0.2)

## 2022-10-21 LAB — PHOSPHORUS: Phosphorus: 3.4 mg/dL (ref 2.5–4.6)

## 2022-10-21 LAB — MAGNESIUM: Magnesium: 2.5 mg/dL — ABNORMAL HIGH (ref 1.7–2.4)

## 2022-10-21 MED ORDER — ALBUTEROL SULFATE HFA 108 (90 BASE) MCG/ACT IN AERS: 2.0000 | INHALATION_SPRAY | Freq: Four times a day (QID) | RESPIRATORY_TRACT | 2 refills | Status: DC | PRN

## 2022-10-21 MED ORDER — POLYETHYLENE GLYCOL 3350 17 G PO PACK: 17.0000 g | PACK | Freq: Two times a day (BID) | ORAL | 0 refills | Status: AC

## 2022-10-21 MED ORDER — FLUTICASONE-SALMETEROL 100-50 MCG/ACT IN AEPB: 1.0000 | INHALATION_SPRAY | Freq: Two times a day (BID) | RESPIRATORY_TRACT | 0 refills | Status: DC

## 2022-10-21 MED ORDER — MIRTAZAPINE 45 MG PO TABS: 45.0000 mg | ORAL_TABLET | Freq: Every day | ORAL | 0 refills | Status: DC

## 2022-10-21 NOTE — Discharge Summary (Addendum)
Physician Discharge Summary   Patient: Brad [name] MRN: [id number] DOB: 01-02-1956  Admit date:     10/19/2022  Discharge date: 10/21/22  Discharge Physician: Raiford Noble, DO   PCP: Patient, No Pcp Per   Recommendations at discharge:   Follow-up with PCP within 1 to 2 weeks and repeat CBC, CMP, mag, Phos within 1 week Follow-up with pulmonary in outpatient setting Follow-up with cardiology outpatient setting Follow-up with psychiatry in outpatient setting Follow-up with general surgery in outpatient setting Follow-up neurology in outpatient setting for cervical MRI and nerve conduction studies  Discharge Diagnoses: Principal Problem:   Chest pain Active Problems:   Dyslipidemia   Anxiety and depression   Essential hypertension   Generalized anxiety disorder   Peripheral neuropathy   Somatic complaints, multiple  Resolved Problems:   * No resolved hospital problems. [place] [facility] Course: The patient is a 67 year old Caucasian male with a past medical history significant for presumptive GERD, hypertension, MVP and DJD as well as other comorbidities who has been here multiple times (at least 20 times and 10 ED visits in the last 20 days) in the last 3 months with multiple ED visits who complained of chest discomfort and burning with no radiation dyspnea palpitations.  Patient believes that his symptoms are related to his HVAC system and fiberglass particles being inhaled.  He denies any nausea or vomiting he has multiple somatic complaints and complains of constipation, hernia pain as well as pain from his IV site and bruising.  He had chest discomfort and cardiology was consulted and troponins were flat with mildly elevated and CT PE scan of the chest was done which showed " No acute chest CT or CTA findings. Aortic and coronary artery atherosclerosis. Mild cardiomegaly. Slightly dilated aortic root at the sinuses of Valsalva measuring 4 cm. Recommend annual imaging followup by CTA or  MRA. Kyphodextroscoliosis, degenerative and postsurgical changes and osteopenia."  Given this chest discomfort cardiology was consulted.  Patient was extremely anxious and multiple ED visits and given his history of anxiety depression psychiatry was consulted and recommended medication changes.  Patient was complaining of a left-sided inguinal hernia so general surgery was also consulted and he was noted to have an appropriate stage of healing for his open inguinal hernia repair and had a recent postop CT scan a week ago.  The surgeon recommended continue icing and heating and analgesia as needed and recommended aggressive bowel regimen for chronic constipation.  Given his continued dyspnea pulmonary is consulted and they recommended bronchodilators and inhaled steroids and send a hypersensitivity panel and recommending avoiding exposure to fiberglass.  Echocardiogram was recommended by cardiology for chest discomfort is still pending read.  Patient was also complaining of some right arm pain and discomfort where his IV was so vascular ultrasound was done and showed "No evidence of deep vein thrombosis in the upper extremity. Findings consistent with age indeterminate superficial vein thrombosis involving the right cephalic vein."   Currently awaiting his echo results given that multiple specialists are involved for his multiple somatic complaints.  Cardiology evaluated and evaluated his echo and felt he was stable from discharge and felt that his chest pain was noncardiac in nature.  Pulmonary also evaluated and provided some recommendations and they signed off the case and arranged outpatient follow-up psychiatry saw the patient and patient did not want any other resources from psychiatry but his medications were adjusted.  Prior to discharge the patient was complaining some tingling and burning in his  hand so a cervical MRI was ordered however patient did not want to proceed with that so it was canceled.   Right prior to discharge patient started try to make excuses not to leave the hospital given his living environment and was malingering.  Arrangements were already made to have the patient evaluated outpatient with neurology with nerve conduction study and they will order MRI as well if necessary.  Patient was deemed medically stable to be discharged and continued to have multiple somatic complaints.  He is at high risk for going back to the hospital given his multiple ED visits in the last 20 days.  Social work was consulted for patient's living situation and potential solutions were provided but patient did not want to use these.  Assessment and Plan: Chest pain, rule out ACS - The patient will be admitted to an observation cardiac telemetry bed. - Will follow serial troponins and EKGs. - The patient will be placed on aspirin as well as p.r.n. sublingual nitroglycerin and morphine sulfate for pain. - We will obtain a cardiology consult in a.m. for further cardiac risk stratification and they recommended echocardiogram which is done and pending read Given that he continued be dyspneic on exertion pulmonary was consulted and we are sending off a hypersensitivity panel and recommending avoiding exposure and starting the patient on bronchodilators and inhaled steroids -Echocardiogram was done and reviewed by the cardiologist and they felt the patient can be discharged with outpatient follow-up -Given that the exposure to the fiberglass was causing his symptoms it is recommended that he avoid this as best as he can   Constipation and inguinal hernia pain, improved -General surgery consulted and feel that he is appropriate hearing and recommending an aggressive bowel regimen which has been initiated -Patient had a good bowel movement yesterday and continues to complain of hernia incision however surgeons have evaluated this and felt it is stable   Dyslipidemia - We will continue statin therapy and  Lovaza.   Anxiety and depression - We will continue BuSpar, Xanax, trazodone and Remeron and have increased the Remeron per psychiatry recommendations - Psychiatry was consulted given his significant anxiety and multiple hospitalizations and have made medication adjustments.  Psych was reconsulted prior to discharge and they offered the patient PHP and he refused   Essential hypertension - We will continue his antihypertensives.   Peripheral neuropathy - We will continue Lyrica. -Patient was complaining of worsening right arm numbness weakness and tingling and felt it was attributed to that his IV that was in place and states the IV is pressing against his nerve.  I offered the patient a cervical spine MRI and this was ordered however subsequently patient wanted talk to me again and wanted not to proceed with that.  Patient himself felt it was related to the IV being placed and perseverated on that.  I placed an ambulatory referral for him for [facility]y to have them evaluate and do nerve conduction studies then. -Patient was discharged earlier in the day and right prior to discharge he was complained that the arm was worsening and then wanted to agree for the MRI.  I felt that he was malingering and avoiding to try going home given his home situation and since he was stable the MRI of the cervical spine can be done in the outpatient setting. I did not suspect CVA as he had no focal deficits and truly feel he was attempting to stay in the hospital another night.  Hyponatremia -IV fluid hydration stopped given the patient refused his IV and removed given the pain -Na+ Trend: Recent Labs  Lab 10/02/22 0936 10/03/22 2311 10/11/22 1644 10/13/22 1410 10/17/22 1508 10/19/22 2324 10/21/22 0412  NA 131* 134* 130* 130* 133* 127* 135  -Continue to monitor and trend and repeat CMP in a.m.   Right arm pain and bruising -Check vascular ultrasound showed no evidence of acute DVT  but did show an age-indeterminate superficial venous clot -Patient continues to get multiple IVs and recommended he compress and elevation -See above in Peripheral Neuropathy  Normocytic Anemia Recent Labs  Lab 10/02/22 0936 10/03/22 2311 10/11/22 1644 10/13/22 1410 10/17/22 1508 10/19/22 2324 10/21/22 0412  HGB 13.1 12.4* 14.1 13.7 13.9 15.1 11.8*  HCT 37.8* 35.3* 39.4 39.2 39.9 42.7 35.3*  MCV 90.2 90.3 89.3 89.9 89.5 89.7 93.6  -Patient likely had a dilutional drop and was hemoconcentrated on admission.  He can have an outpatient evaluation with his PCP and repeat CBC within 1 week as well as outpatient anemia panel  Hypoalbuminemia -Patient's Albumin Trend: Recent Labs  Lab 09/30/22 0305 10/02/22 0936 10/03/22 2311 10/11/22 1644 10/13/22 1410 10/17/22 1508 10/21/22 0412  ALBUMIN 4.2 4.1 3.9 4.3 4.3 4.9 3.3*  -Continue to Monitor and Trend and repeat CMP in the outpatient setting   Consultants: Cardiology, pulmonary, general surgery, psychiatry Procedures performed: Echocardiogram Disposition: Home Diet recommendation:  Discharge Diet Orders (From admission, onward)     Start     Ordered   10/21/22 0000  Diet - low sodium heart healthy        10/21/22 1513           Cardiac diet DISCHARGE MEDICATION: Allergies as of 10/21/2022       Reactions   Duloxetine Hcl Other (See Comments)   Manic emotions   Gabapentin Other (See Comments)   Unable to urinate, drowsiness   Trelegy Ellipta [fluticasone-umeclidin-vilant] Other (See Comments)   Dry cracked lips and mouth   Trileptal [oxcarbazepine] Other (See Comments)   Bad taste in his mouth        Medication List     STOP taking these medications    Cerefolin NAC 6-2-600 MG Tabs   lidocaine 5 % Commonly known as: LIDODERM   lubiprostone 24 MCG capsule Commonly known as: AMITIZA   memantine 10 MG tablet Commonly known as: Namenda   oxyCODONE 5 MG immediate release tablet Commonly known as: Oxy  IR/ROXICODONE   prenatal multivitamin Tabs tablet   zolpidem 12.5 MG CR tablet Commonly known as: AMBIEN CR       TAKE these medications    albuterol 108 (90 Base) MCG/ACT inhaler Commonly known as: VENTOLIN HFA Inhale 2 puffs into the lungs every 6 (six) hours as needed for wheezing or shortness of breath.   Alpha-Lipoic Acid 600 MG Caps Take 600 mg by mouth daily.   amLODipine 5 MG tablet Commonly known as: NORVASC Take 2 tablets (10 mg total) by mouth daily. What changed: how much to take   atorvastatin 10 MG tablet Commonly known as: LIPITOR Take 10 mg by mouth 3 (three) times a week. MWF   BuSpar 10 MG tablet Generic drug: busPIRone Take 20 mg by mouth 3 (three) times daily.   Coenzyme Q10 50 MG Caps Take 50 mg by mouth daily.   dicyclomine 20 MG tablet Commonly known as: BENTYL Take 1 tablet (20 mg total) by mouth 3 (three) times daily as needed for spasms.   docusate  sodium 100 MG capsule Commonly known as: COLACE Take 1 capsule (100 mg total) by mouth every 12 (twelve) hours.   fluticasone-salmeterol 100-50 MCG/ACT Aepb Commonly known as: Advair Diskus Inhale 1 puff into the lungs 2 (two) times daily.   LORazepam 1 MG tablet Commonly known as: ATIVAN Take 1 mg by mouth 2 (two) times daily.   losartan 100 MG tablet Commonly known as: COZAAR Take 100 mg by mouth daily.   mirtazapine 45 MG tablet Commonly known as: REMERON Take 1 tablet (45 mg total) by mouth at bedtime. What changed:  medication strength how much to take   multivitamin tablet Take 1 tablet by mouth daily.   Omega-3 1000 MG Caps Take 1,000 mg by mouth daily.   polyethylene glycol 17 g packet Commonly known as: MiraLax Take 17 g by mouth 2 (two) times daily. What changed: when to take this   pregabalin 75 MG capsule Commonly known as: LYRICA Take 75 mg by mouth 2 (two) times daily.   senna-docusate 8.6-50 MG tablet Commonly known as: Senokot-S Take 1 tablet by mouth  at bedtime as needed for mild constipation.   traZODone 50 MG tablet Commonly known as: DESYREL Take 50-100 mg by mouth at bedtime as needed for sleep.   Vitamin D3 125 MCG (5000 UT) Tabs Take 5,000 Units by mouth daily.   zaleplon 10 MG capsule Commonly known as: SONATA Take 10-20 mg by mouth at bedtime as needed for sleep.   zinc gluconate 50 MG tablet Take 50 mg by mouth daily.        Follow-up Information     [facility]. Go on 10/28/2022.   Specialty: Pulmonology Why: Appointment scheduled for 10/28/22 at 11:30 AM Contact information: [address] [place] [id number] [phone]               Discharge Exam: Danley Danker Weights   10/19/22 1740  Weight: 60 kg   Vitals:   10/21/22 0910 10/21/22 1304  BP: 136/82 113/79  Pulse: 76 66  Resp:  20  Temp:  98.1 F (36.7 C)  SpO2: 100% 100%   Examination: Physical Exam:  Constitutional: WN/WD older appearing Caucasian male who is extremely anxious and nervous and has perseverating thoughts Respiratory: Diminished to auscultation bilaterally, no wheezing, rales, rhonchi or crackles. Normal respiratory effort and patient is not tachypenic. No accessory muscle use.  Cardiovascular: RRR, no murmurs / rubs / gallops. S1 and S2 auscultated. No extremity edema. Abdomen: Soft, non-tender, non-distended.  Has a inguinal hernia on the left side with the scar which is nonincarcerated.  Bowel sounds positive.  GU: Deferred. Musculoskeletal: No clubbing / cyanosis of digits/nails. No joint deformity upper and lower extremities. Skin: Has some bruising in his right arm Neurologic: CN 2-12 grossly intact with no focal deficits. Romberg sign cerebellar reflexes not assessed.  Psychiatric: Alert and oriented x 3.  Extremely anxious mood and appropriate affect.   Condition at discharge: stable  The results of significant diagnostics from this hospitalization  (including imaging, microbiology, ancillary and laboratory) are listed below for reference.   Imaging Studies: VAS [place] UPPER EXTREMITY VENOUS DUPLEX  Result Date: 10/20/2022 UPPER VENOUS STUDY  Patient Name:  JECOREY FUSILLO [facility]  Date of Exam:   10/20/2022 Medical Rec #: [id number]    Accession #:    [id number] Date of Birth: 07-14-1956   Patient Gender: M Patient Age:   19 years Exam Location:  [facility] Procedure:      VAS [place] UPPER EXTREMITY VENOUS DUPLEX Referring Phys: Raiford Noble --------------------------------------------------------------------------------  Indications: Pain and brusing Other Indications: IV infiltration. Limitations: Poor ultrasound/tissue interface and body habitus. Comparison Study: No previous exams Performing Technologist: Jody Hill RVT, RDMS  Examination Guidelines: A complete evaluation includes B-mode imaging, spectral Doppler, color Doppler, and power Doppler as needed of all accessible portions of each vessel. Bilateral testing is considered an integral part of a complete examination. Limited examinations for reoccurring indications may be performed as noted.  Right Findings: +----------+------------+---------+-----------+----------+-------------------+ RIGHT     CompressiblePhasicitySpontaneousProperties      Summary       +----------+------------+---------+-----------+----------+-------------------+ IJV           Full       Yes       Yes                                  +----------+------------+---------+-----------+----------+-------------------+ Subclavian    Full       Yes       Yes                                  +----------+------------+---------+-----------+----------+-------------------+ Axillary      Full       Yes       Yes                                  +----------+------------+---------+-----------+----------+-------------------+ Brachial      Full       Yes       Yes                                   +----------+------------+---------+-----------+----------+-------------------+ Radial        Full                                                      +----------+------------+---------+-----------+----------+-------------------+ Ulnar         Full                                                      +----------+------------+---------+-----------+----------+-------------------+ Cephalic      None       No        No                Age Indeterminate  +----------+------------+---------+-----------+----------+-------------------+ Basilic       Full                                  not well visualized +----------+------------+---------+-----------+----------+-------------------+  Left Findings: +----------+------------+---------+-----------+----------+-------+ LEFT      CompressiblePhasicitySpontaneousPropertiesSummary +----------+------------+---------+-----------+----------+-------+ Subclavian    Full       Yes       Yes                      +----------+------------+---------+-----------+----------+-------+  Summary:  Right: No evidence of deep vein thrombosis in the upper extremity. Findings consistent with age indeterminate superficial vein thrombosis involving the right cephalic vein.  Left: No evidence of thrombosis in the subclavian.  *See table(s) above for measurements and observations.  Diagnosing physician: Jamelle Haring Electronically signed by Jamelle Haring on 10/20/2022 at 10:06:13 PM.    Final    [place] RT UPPER EXTREM LTD SOFT TISSUE NON VASCULAR  Result Date: 10/20/2022 CLINICAL DATA:  Pain and bruising at injection sites of right proximal posterior forearm. EXAM: ULTRASOUND LEFT UPPER EXTREMITY LIMITED TECHNIQUE: Ultrasound examination of the upper extremity soft tissues was performed in the area of clinical concern. COMPARISON:  None Available. FINDINGS: Grayscale and color flow images performed of the area of interest of the posterior right forearm. No abnormal  fluid collection or mass was seen. Comparison images were performed of the left posterior forearm without significant asymmetry. IMPRESSION: No abnormal fluid collection or mass was seen in the area of interest of the posterior right forearm. Electronically Signed   By: Yvonne Kendall M.D.   On: 10/20/2022 18:23   ECHOCARDIOGRAM COMPLETE  Result Date: 10/20/2022    ECHOCARDIOGRAM REPORT   Patient Name:   BARTH REPP [facility] Date of Exam: 10/20/2022 Medical Rec #:  [id number]   Height:       69.0 in Accession #:    [id number]  Weight:       132.3 lb Date of Birth:  [birth year]  BSA:          1.733 m Patient Age:    67 years    BP:           133/88 mmHg Patient Gender: M           HR:           71 bpm. Exam Location:  Inpatient Procedure: 2D Echo, Color Doppler and Cardiac Doppler Indications:    Chest pain  History:        Patient has no prior history of Echocardiogram examinations.                 Mitral Valve Prolapse; Risk Factors:Hypertension.  Sonographer:    Eartha Inch Referring Phys: [id number] LATIF [facility]  Sonographer Comments: Image acquisition challenging due to patient body habitus and Image acquisition challenging due to respiratory motion. IMPRESSIONS  1. Left ventricular ejection fraction, by estimation, is 60 to 65%. The left ventricle has normal function. The left ventricle has no regional wall motion abnormalities. Left ventricular diastolic parameters were normal.  2. Right ventricular systolic function is normal. The right ventricular size is normal.  3. Right atrial size was mildly dilated.  4. Mild bi leaflet prolapse with . The mitral valve is myxomatous. Trivial mitral valve regurgitation. No evidence of mitral stenosis.  5. The aortic valve is tricuspid. Aortic valve regurgitation is not visualized. No aortic stenosis is present.  6. The inferior vena cava is normal in size with greater than 50% respiratory variability, suggesting right atrial pressure of 3 mmHg. FINDINGS  Left Ventricle:  Left ventricular ejection fraction, by estimation, is 60 to 65%. The left ventricle has normal function. The left ventricle has no regional wall motion abnormalities. The left ventricular internal cavity size was normal in size. There is  no left ventricular hypertrophy. Left ventricular diastolic parameters were normal. Right Ventricle: The right ventricular size is normal. No increase in right ventricular wall thickness. Right ventricular systolic function is normal. Left Atrium: Left atrial  size was normal in size. Right Atrium: Right atrial size was mildly dilated. Pericardium: There is no evidence of pericardial effusion. Mitral Valve: Mild bi leaflet prolapse with. The mitral valve is myxomatous. Trivial mitral valve regurgitation. No evidence of mitral valve stenosis. Tricuspid Valve: The tricuspid valve is normal in structure. Tricuspid valve regurgitation is mild . No evidence of tricuspid stenosis. Aortic Valve: The aortic valve is tricuspid. Aortic valve regurgitation is not visualized. No aortic stenosis is present. Pulmonic Valve: The pulmonic valve was normal in structure. Pulmonic valve regurgitation is not visualized. No evidence of pulmonic stenosis. Aorta: The aortic root is normal in size and structure. Venous: The inferior vena cava is normal in size with greater than 50% respiratory variability, suggesting right atrial pressure of 3 mmHg. IAS/Shunts: No atrial level shunt detected by color flow Doppler.  LEFT VENTRICLE PLAX 2D LVIDd:         5.00 cm   Diastology LVIDs:         3.20 cm   LV e' medial:  9.36 cm/s LV PW:         0.90 cm   LV e' lateral: 9.57 cm/s LV IVS:        0.70 cm LVOT diam:     2.10 cm LV SV:         69 LV SV Index:   40 LVOT Area:     3.46 cm  RIGHT VENTRICLE             IVC RV S prime:     11.20 cm/s  IVC diam: 0.80 cm TAPSE (M-mode): 2.0 cm LEFT ATRIUM             Index        RIGHT ATRIUM           Index LA diam:        3.50 cm 2.02 cm/m   RA Area:     27.40 cm LA Vol  (A2C):   65.3 ml 37.68 ml/m  RA Volume:   99.20 ml  57.24 ml/m LA Vol (A4C):   46.1 ml 26.60 ml/m LA Biplane Vol: 57.1 ml 32.95 ml/m  AORTIC VALVE LVOT Vmax:   99.20 cm/s LVOT Vmean:  63.500 cm/s LVOT VTI:    0.198 m  AORTA Ao Root diam: 3.20 cm Ao Asc diam:  3.20 cm TRICUSPID VALVE TR Peak grad:   14.6 mmHg TR Vmax:        191.00 cm/s  SHUNTS Systemic VTI:  0.20 m Systemic Diam: 2.10 cm Jenkins Rouge MD Electronically signed by Jenkins Rouge MD Signature Date/Time: 10/20/2022/5:27:11 PM    Final    DG Abd 1 View  Result Date: 10/20/2022 CLINICAL DATA:  History of hernia repair with abdominal pain EXAM: ABDOMEN - 1 VIEW COMPARISON:  Abdominal radiograph dated 09/03/2022 FINDINGS: Nonobstructive bowel gas pattern. No free air or pneumatosis. Moderate volume stool throughout the colon. No abnormal radio-opaque calculi or mass effect. No acute or substantial osseous abnormality. The sacrum and coccyx are partially obscured by overlying bowel contents. Partially imaged lung bases are clear. IMPRESSION: Nonobstructive bowel gas pattern. Moderate volume stool throughout the colon. Electronically Signed   By: Darrin Nipper M.D.   On: 10/20/2022 14:56   CT Angio Chest PE W and/or Wo Contrast  Result Date: 10/20/2022 CLINICAL DATA:  Pulmonary embolism suspected, high probability. Elevated D-dimer. EXAM: CT ANGIOGRAPHY CHEST WITH CONTRAST TECHNIQUE: Multidetector CT imaging of the chest was performed using the standard protocol  during bolus administration of intravenous contrast. Multiplanar CT image reconstructions and MIPs were obtained to evaluate the vascular anatomy. RADIATION DOSE REDUCTION: This exam was performed according to the departmental dose-optimization program which includes automated exposure control, adjustment of the mA and/or kV according to patient size and/or use of iterative reconstruction technique. CONTRAST:  29m OMNIPAQUE IOHEXOL 350 MG/ML SOLN COMPARISON:  Portable chest yesterday, PA Lat  10/11/2022, CTA chest 08/26/2022 FINDINGS: Cardiovascular: Mild panchamber cardiomegaly is again noted. There is no venous dilatation. The pulmonary arteries are normal in caliber without visible emboli. There is aortic atherosclerosis and scattered calcification in the LAD and circumflex coronary arteries. The aortic root at the sinuses of Valsalva is slightly dilated at 4.0 cm. The remaining aorta is within normal caliber limits. No aortic dissection or stenosis is seen. The great vessels are not opacified but are normal caliber. Mediastinum/Nodes: No enlarged mediastinal, hilar, or axillary lymph nodes. Thyroid gland, trachea, and esophagus demonstrate no significant findings. Lungs/Pleura: There is mild breathing motion artifact through the bases. No pulmonary infiltrate or nodule is seen. No pleural effusion or pneumothorax. Upper Abdomen: No acute abnormality. Musculoskeletal: There is mild thoracic kyphodextroscoliosis and degenerative changes. Multilevel lower cervical fusion plating is partially visible, extending to and including T1. Mild osteopenia. No suspicious bone lesions or chest wall abnormality. Review of the MIP images confirms the above findings. IMPRESSION: 1. No acute chest CT or CTA findings. 2. Aortic and coronary artery atherosclerosis. 3. Mild cardiomegaly. 4. Slightly dilated aortic root at the sinuses of Valsalva measuring 4 cm. Recommend annual imaging followup by CTA or MRA. This recommendation follows 2010 ACCF/AHA/AATS/ACR/ASA/SCA/SCAI/SIR/STS/SVM Guidelines for the Diagnosis and Management of Patients with Thoracic Aortic Disease. Circulation. 2010; 121:[id number] Aortic aneurysm NOS (ICD10-I71.9) 5. Kyphodextroscoliosis, degenerative and postsurgical changes and osteopenia. Electronically Signed   By: KTelford NabM.D.   On: 10/20/2022 00:38   DG CHEST PORT 1 VIEW  Result Date: 10/19/2022 CLINICAL DATA:  Chest pain EXAM: PORTABLE CHEST 1 VIEW COMPARISON:  10/11/2022 FINDINGS:  No acute airspace disease or effusion. Normal cardiomediastinal silhouette. No pneumothorax. Air distended bowel beneath the right hemidiaphragm. IMPRESSION: No active disease. Air distended bowel beneath the right hemidiaphragm. Electronically Signed   By: KDonavan FoilM.D.   On: 10/19/2022 18:06   DG Shoulder Right  Result Date: 10/13/2022 CLINICAL DATA:  Posterior shoulder pain. EXAM: RIGHT SHOULDER - 2+ VIEW COMPARISON:  None Available. FINDINGS: AP and Y-views obtained portably are submitted. There is some rotation on the Y-view, and humeral head may be slightly subluxed anteriorly relative to the glenoid with resulting narrowing of the subcoracoid space. No frank dislocation. No evidence of acute fracture. Mild glenohumeral and acromioclavicular degenerative changes. There are postsurgical changes in the lower cervical spine. IMPRESSION: Possible mild anterior subluxation of the humeral head relative to the glenoid. No frank dislocation or acute fracture. Electronically Signed   By: WRichardean SaleM.D.   On: 10/13/2022 17:51   DG Chest 2 View  Result Date: 10/13/2022 CLINICAL DATA:  Difficulty swallowing. EXAM: CHEST - 2 VIEW COMPARISON:  October 11, 2022. FINDINGS: Stable cardiomediastinal silhouette. Both lungs are clear. The visualized skeletal structures are unremarkable. IMPRESSION: No active cardiopulmonary disease. Electronically Signed   By: JMarijo ConceptionM.D.   On: 10/13/2022 16:32   DG Neck Soft Tissue  Result Date: 10/13/2022 CLINICAL DATA:  Difficulty swallowing. EXAM: NECK SOFT TISSUES - 1+ VIEW COMPARISON:  None Available. FINDINGS: There is no evidence of retropharyngeal soft tissue swelling  or epiglottic enlargement. The cervical airway is unremarkable. Patient is status post surgical anterior fusion of C5-6, C6-7 and C7-T1. IMPRESSION: Postsurgical changes as described above. No other definite abnormality seen. Electronically Signed   By: Marijo Conception M.D.   On: 10/13/2022  16:30   CT Abdomen Pelvis W Contrast  Result Date: 10/13/2022 CLINICAL DATA:  Bowel obstruction suspected, abdominal pain EXAM: CT ABDOMEN AND PELVIS WITH CONTRAST TECHNIQUE: Multidetector CT imaging of the abdomen and pelvis was performed using the standard protocol following bolus administration of intravenous contrast. RADIATION DOSE REDUCTION: This exam was performed according to the departmental dose-optimization program which includes automated exposure control, adjustment of the mA and/or kV according to patient size and/or use of iterative reconstruction technique. CONTRAST:  128m OMNIPAQUE IOHEXOL 300 MG/ML  SOLN COMPARISON:  10/11/2022 FINDINGS: Lower chest: No acute abnormality. Hepatobiliary: No solid liver abnormality is seen. No gallstones, gallbladder wall thickening, or biliary dilatation. Pancreas: Unremarkable. No pancreatic ductal dilatation or surrounding inflammatory changes. Spleen: Normal in size without significant abnormality. Adrenals/Urinary Tract: Adrenal glands are unremarkable. Kidneys are normal, without renal calculi, solid lesion, or hydronephrosis. Bladder is unremarkable. Stomach/Bowel: Stomach is within normal limits. Appendix not clearly visualized. No evidence of bowel wall thickening, distention, or inflammatory changes. Sigmoid diverticula. Moderate burden of stool throughout the colon. Vascular/Lymphatic: Aortic atherosclerosis. No enlarged abdominal or pelvic lymph nodes. Reproductive: No mass or other significant abnormality. Other: No abdominal wall hernia or abnormality. No ascites. Musculoskeletal: No acute or significant osseous findings. IMPRESSION: 1. No acute CT findings of the abdomen or pelvis to explain abdominal pain. Specifically, no evidence of bowel obstruction. 2. Sigmoid diverticulosis without evidence of acute diverticulitis. 3. Moderate burden of stool throughout the colon. Aortic Atherosclerosis (ICD10-I70.0). Electronically Signed   By: ADelanna AhmadiM.D.   On: 10/13/2022 15:42   [place]SCROTUM W/DOPPLER  Result Date: 10/13/2022 CLINICAL DATA:  Left testicular pain.  Evaluate for torsion. EXAM: SCROTAL ULTRASOUND DOPPLER ULTRASOUND OF THE TESTICLES TECHNIQUE: Complete ultrasound examination of the testicles, epididymis, and other scrotal structures was performed. Color and spectral Doppler ultrasound were also utilized to evaluate blood flow to the testicles. COMPARISON:  None available FINDINGS: Right testicle Measurements: 3.8 x 1.8 x 2.3 cm. No mass or microlithiasis visualized. Left testicle Measurements: 4.1 x 2.4 x 2.5 cm. No mass or microlithiasis visualized. Right epididymis:  Normal in size and appearance. Left epididymis:  Normal in size and appearance. Hydrocele:  Small bilateral hydroceles, larger on the left. Varicocele:  None visualized. Pulsed Doppler interrogation of both testes demonstrates normal low resistance arterial and venous waveforms bilaterally. IMPRESSION: 1. No evidence of testicular torsion. 2. Small bilateral hydroceles, larger on the left. Electronically Signed   By: FMiachel RouxM.D.   On: 10/13/2022 15:20   CT Abdomen Pelvis W Contrast  Result Date: 10/11/2022 CLINICAL DATA:  Bowel obstruction suspected. Left-sided abdominal pain. EXAM: CT ABDOMEN AND PELVIS WITH CONTRAST TECHNIQUE: Multidetector CT imaging of the abdomen and pelvis was performed using the standard protocol following bolus administration of intravenous contrast. RADIATION DOSE REDUCTION: This exam was performed according to the departmental dose-optimization program which includes automated exposure control, adjustment of the mA and/or kV according to patient size and/or use of iterative reconstruction technique. CONTRAST:  735mOMNIPAQUE IOHEXOL 350 MG/ML SOLN COMPARISON:  CT abdomen and pelvis 09/26/2022 FINDINGS: Lower chest: No acute abnormality. Hepatobiliary: Subcentimeter cysts in the liver appear unchanged. Gallbladder and bile ducts are within  normal limits. Pancreas: Unremarkable. No pancreatic  ductal dilatation or surrounding inflammatory changes. Spleen: Normal in size without focal abnormality. Adrenals/Urinary Tract: Adrenal glands are unremarkable. Kidneys are normal, without renal calculi, focal lesion, or hydronephrosis. Bladder is unremarkable. Stomach/Bowel: Stomach is within normal limits. Appendix is not seen. No evidence of bowel wall thickening, distention, or inflammatory changes. There is gaseous distention of small bowel with some scattered air-fluid levels. Vascular/Lymphatic: Aortic atherosclerosis. No enlarged abdominal or pelvic lymph nodes. Reproductive: Prostate is unremarkable. Other: No abdominal wall hernia or abnormality. No abdominopelvic ascites. Musculoskeletal: Degenerative changes affect the spine. IMPRESSION: 1. Gaseous distention of small bowel with scattered air-fluid levels. Findings can be seen in the setting of ileus or enteritis. No bowel obstruction. Aortic Atherosclerosis (ICD10-I70.0). Electronically Signed   By: Ronney Asters M.D.   On: 10/11/2022 19:25   DG Chest 2 View  Result Date: 10/11/2022 CLINICAL DATA:  Aspiration EXAM: CHEST - 2 VIEW COMPARISON:  Chest x-ray 10/02/2022 FINDINGS: The heart size and mediastinal contours are within normal limits. Both lungs are clear. There is dextroconvex scoliosis of the thoracic spine. Cervical spinal fusion plate is present. No acute fractures are seen. IMPRESSION: No active cardiopulmonary disease. Electronically Signed   By: Ronney Asters M.D.   On: 10/11/2022 17:12   CT HEAD WO CONTRAST  Result Date: 10/02/2022 CLINICAL DATA:  Headache EXAM: CT HEAD WITHOUT CONTRAST TECHNIQUE: Contiguous axial images were obtained from the base of the skull through the vertex without intravenous contrast. RADIATION DOSE REDUCTION: This exam was performed according to the departmental dose-optimization program which includes automated exposure control, adjustment of the mA  and/or kV according to patient size and/or use of iterative reconstruction technique. COMPARISON:  CT Head 02/26/19 FINDINGS: Brain: No hemorrhage. No CT evidence of an acute infarct. No hydrocephalus. No extra-axial fluid collection. No midline shift. Vascular: No hyperdense vessel or unexpected calcification. Skull: Normal. Negative for fracture or focal lesion. Sinuses/Orbits: No mastoid or middle ear effusion. Foramina cerumen in the left EAC. Paranasal sinuses are clear. Orbits are unremarkable. Other: None. IMPRESSION: No specific etiology for headaches or altered mental status identified. Electronically Signed   By: Marin Roberts M.D.   On: 10/02/2022 11:02   DG Chest Port 1 View  Result Date: 10/02/2022 CLINICAL DATA:  Patient complains of pain in lungs after cleaning croup came to his house. EXAM: PORTABLE CHEST 1 VIEW COMPARISON:  09/10/2022 FINDINGS: Stable cardiomediastinal contours. Heart size is normal. No pleural effusion or edema. No airspace opacities identified. Thoracic scoliosis with convexity towards the right. No acute osseous abnormality. Stable ACDF within the lower cervical spine IMPRESSION: 1. No acute cardiopulmonary abnormalities. 2. Thoracic scoliosis. Electronically Signed   By: Kerby Moors M.D.   On: 10/02/2022 10:11   US Abdomen Limited  Result Date: 09/30/2022 CLINICAL DATA:  Right upper quadrant pain for 2 days. EXAM: ULTRASOUND ABDOMEN LIMITED RIGHT UPPER QUADRANT COMPARISON:  09/26/2022. FINDINGS: Gallbladder: No stones are seen within the gallbladder. There is gallbladder wall thickening measuring 5 mm. No pericholecystic fluid. No sonographic Murphy sign noted by sonographer. Common bile duct: Diameter: 5 mm Liver: A small septated cyst is noted in the right lobe of the liver measuring 0.9 x 0.9 x 1.0 cm. Increased parenchymal echogenicity. Portal vein is patent on color Doppler imaging with normal direction of blood flow towards the liver. Other: No free fluid.  IMPRESSION: 1. Gallbladder wall thickening with no evidence of cholelithiasis and no Murphy sign. Correlate clinically to exclude chronic cholecystitis versus local inflammatory changes. 2. Hepatic steatosis  and cyst. Electronically Signed   By: Brett Fairy M.D.   On: 09/30/2022 02:39   CT ABDOMEN PELVIS W CONTRAST  Result Date: 09/26/2022 CLINICAL DATA:  Abdominal pain EXAM: CT ABDOMEN AND PELVIS WITH CONTRAST TECHNIQUE: Multidetector CT imaging of the abdomen and pelvis was performed using the standard protocol following bolus administration of intravenous contrast. RADIATION DOSE REDUCTION: This exam was performed according to the departmental dose-optimization program which includes automated exposure control, adjustment of the mA and/or kV according to patient size and/or use of iterative reconstruction technique. CONTRAST:  11m OMNIPAQUE IOHEXOL 300 MG/ML  SOLN COMPARISON:  Multiple recent priors, including 09/10/2022, 08/30/2022, and 08/18/2022 FINDINGS: Lower chest: Lung bases are clear. Hepatobiliary: Scattered small hepatic cysts, measuring up to 10 mm in segment 4B (series 3/image 20), benign. No follow-up is recommended. Gallbladder is unremarkable. No intrahepatic or extrahepatic ductal dilatation. Pancreas: Within normal limits. Spleen: Within normal limits. Adrenals/Urinary Tract: Adrenal glands are within normal limits. Kidneys are within normal limits.  No hydronephrosis. Bladder is within normal limits. Stomach/Bowel: Stomach is within normal limits. No evidence of bowel obstruction. Appendix is not discretely visualized. Moderate colonic stool burden, suggesting mild constipation. No colonic wall thickening or inflammatory changes. Vascular/Lymphatic: No evidence of abdominal aortic aneurysm. Atherosclerotic calcifications of the abdominal aorta and branch vessels. No suspicious abdominopelvic lymphadenopathy. Reproductive: Prostate is unremarkable. Other: No abdominopelvic ascites.  Postprocedural changes in the left inguinal region. Musculoskeletal: Mild degenerative changes of the mid lumbar spine. IMPRESSION: No CT findings to account for the patient's abdominal pain. No change from multiple recent priors. Electronically Signed   By: SJulian HyM.D.   On: 09/26/2022 19:47    Microbiology: Results for orders placed or performed during the hospital encounter of 10/13/22  Resp panel by RT-PCR (RSV, Flu A&B, Covid) Anterior Nasal Swab     Status: None   Collection Time: 10/13/22  4:34 PM   Specimen: Anterior Nasal Swab  Result Value Ref Range Status   SARS Coronavirus 2 by RT PCR NEGATIVE NEGATIVE Final    Comment: (NOTE) SARS-CoV-2 target nucleic acids are NOT DETECTED.  The SARS-CoV-2 RNA is generally detectable in upper respiratory specimens during the acute phase of infection. The lowest concentration of SARS-CoV-2 viral copies this assay can detect is 138 copies/mL. A negative result does not preclude SARS-Cov-2 infection and should not be used as the sole basis for treatment or other patient management decisions. A negative result may occur with  improper specimen collection/handling, submission of specimen other than nasopharyngeal swab, presence of viral mutation(s) within the areas targeted by this assay, and inadequate number of viral copies(<138 copies/mL). A negative result must be combined with clinical observations, patient history, and epidemiological information. The expected result is Negative.  Fact Sheet for Patients:  hEntrepreneurPulse.com.au Fact Sheet for Healthcare Providers:  hIncredibleEmployment.be This test is no t yet approved or cleared by the [place] and  has been authorized for detection and/or diagnosis of SARS-CoV-2 by FDA under an Emergency Use Authorization (EUA). This EUA will remain  in effect (meaning this test can be used) for the duration of the COVID-19 declaration  under Section 564(b)(1) of the Act, 21 U.S.C.section 360bbb-3(b)(1), unless the authorization is terminated  or revoked sooner.       Influenza A by PCR NEGATIVE NEGATIVE Final   Influenza B by PCR NEGATIVE NEGATIVE Final    Comment: (NOTE) The Xpert Xpress SARS-CoV-2/FLU/RSV plus assay is intended as an aid in the diagnosis  of influenza from Nasopharyngeal swab specimens and should not be used as a sole basis for treatment. Nasal washings and aspirates are unacceptable for Xpert Xpress SARS-CoV-2/FLU/RSV testing.  Fact Sheet for Patients: EntrepreneurPulse.com.au  Fact Sheet for Healthcare Providers: IncredibleEmployment.be  This test is not yet approved or cleared by the [place] FDA and has been authorized for detection and/or diagnosis of SARS-CoV-2 by FDA under an Emergency Use Authorization (EUA). This EUA will remain in effect (meaning this test can be used) for the duration of the COVID-19 declaration under Section 564(b)(1) of the Act, 21 U.S.C. section 360bbb-3(b)(1), unless the authorization is terminated or revoked.     Resp Syncytial Virus by PCR NEGATIVE NEGATIVE Final    Comment: (NOTE) Fact Sheet for Patients: EntrepreneurPulse.com.au  Fact Sheet for Healthcare Providers: IncredibleEmployment.be  This test is not yet approved or cleared by the [place] FDA and has been authorized for detection and/or diagnosis of SARS-CoV-2 by FDA under an Emergency Use Authorization (EUA). This EUA will remain in effect (meaning this test can be used) for the duration of the COVID-19 declaration under Section 564(b)(1) of the Act, 21 U.S.C. section 360bbb-3(b)(1), unless the authorization is terminated or revoked.  Performed at [facility], [place] [address] [place], [place] [address]    Labs: CBC: Recent Labs  Lab 10/17/22 1508 10/19/22 2324 10/21/22 0412  WBC  5.8 8.9 5.2  NEUTROABS 3.8 5.9 2.5  HGB 13.9 15.1 11.8*  HCT 39.9 42.7 35.3*  MCV 89.5 89.7 93.6  PLT 192 211 [id number]*   Basic Metabolic Panel: Recent Labs  Lab 10/17/22 1508 10/19/22 2324 10/21/22 0412  NA 133* 127* 135  K 3.5 3.7 3.8  CL 101 95* 104  CO2 '22 22 24  '$ GLUCOSE 106* 111* 90  BUN '16 22 15  '$ CREATININE 0.69 0.91 0.81  CALCIUM 9.3 9.5 8.7*  MG  --   --  2.5*  PHOS  --   --  3.4   Liver Function Tests: Recent Labs  Lab 10/17/22 1508 10/21/22 0412  AST 25 24  ALT 23 18  ALKPHOS 63 44  BILITOT 0.9 0.7  PROT 7.2 5.1*  ALBUMIN 4.9 3.3*   CBG: No results for input(s): "GLUCAP" in the last 168 hours.  Discharge time spent: greater than 30 minutes.  Signed: Raiford Noble, DO [facility] 10/21/2022

## 2022-10-21 NOTE — Plan of Care (Signed)
  Problem: Education: Goal: Understanding of cardiac disease, CV risk reduction, and recovery process will improve Outcome: Completed/Met Goal: Individualized Educational Video(s) Outcome: Completed/Met   Problem: Activity: Goal: Ability to tolerate increased activity will improve 10/21/2022 2002 by Wilfrid Lund, RN Outcome: Completed/Met 10/21/2022 1425 by Wilfrid Lund, RN Outcome: Progressing   Problem: Cardiac: Goal: Ability to achieve and maintain adequate cardiovascular perfusion will improve Outcome: Completed/Met   Problem: Health Behavior/Discharge Planning: Goal: Ability to safely manage health-related needs after discharge will improve Outcome: Completed/Met   Problem: Education: Goal: Knowledge of General Education information will improve Description: Including pain rating scale, medication(s)/side effects and non-pharmacologic comfort measures 10/21/2022 2002 by Wilfrid Lund, RN Outcome: Completed/Met 10/21/2022 1425 by Wilfrid Lund, RN Outcome: Progressing   Problem: Clinical Measurements: Goal: Ability to maintain clinical measurements within normal limits will improve Outcome: Completed/Met Goal: Will remain free from infection Outcome: Completed/Met Goal: Diagnostic test results will improve Outcome: Completed/Met Goal: Respiratory complications will improve Outcome: Completed/Met Goal: Cardiovascular complication will be avoided Outcome: Completed/Met   Problem: Activity: Goal: Risk for activity intolerance will decrease 10/21/2022 2002 by Wilfrid Lund, RN Outcome: Completed/Met 10/21/2022 1425 by Wilfrid Lund, RN Outcome: Progressing   Problem: Nutrition: Goal: Adequate nutrition will be maintained Outcome: Completed/Met   Problem: Coping: Goal: Level of anxiety will decrease Outcome: Completed/Met   Problem: Elimination: Goal: Will not experience complications related to bowel motility Outcome:  Completed/Met Goal: Will not experience complications related to urinary retention Outcome: Completed/Met   Problem: Pain Managment: Goal: General experience of comfort will improve 10/21/2022 2002 by Wilfrid Lund, RN Outcome: Completed/Met 10/21/2022 1425 by Wilfrid Lund, RN Outcome: Progressing   Problem: Safety: Goal: Ability to remain free from injury will improve 10/21/2022 2002 by Wilfrid Lund, RN Outcome: Completed/Met 10/21/2022 1425 by Wilfrid Lund, RN Outcome: Progressing   Problem: Skin Integrity: Goal: Risk for impaired skin integrity will decrease 10/21/2022 2002 by Wilfrid Lund, RN Outcome: Completed/Met 10/21/2022 1425 by Wilfrid Lund, RN Outcome: Progressing

## 2022-10-21 NOTE — Progress Notes (Signed)
[facility] Psych ED Progress Note  10/21/2022 5:11 PM Brad [name] [facility]  MRN:  [id number]   Principal Problem: Chest pain Diagnosis:  Principal Problem:   Chest pain Active Problems:   Generalized anxiety disorder   Essential hypertension   Dyslipidemia   Anxiety and depression   Peripheral neuropathy   Somatic complaints, multiple   ED Assessment Time Calculation: Start Time: 1535 Stop Time: 1610 Total Time in Minutes (Assessment Completion): 35   Subjective:  Brad [name] is a 67 y.o. male patient admitted with chest discomfort. Patient initially presented with increase anxiety, and hyper focused, tangential in thoughts therefore psych consult was placed. Of note patient has had 11 ER visits.   Brad [name] was seen and evaluated face-to-face by this provider.  Psychiatric consult was placed due to anxiety and depression.  Patient reports I been seen multiple times for anxiety and constipation.  States his home is unbearable to live in due to HVAC system is "disseminated particles" throughout the air, which  he reports he is  inhaling.  Patient states his refrigerator is now working appropriately, but his landlord does not believe he has a Pharmacist, community.  Patient states he lost 40 pounds since September, he also has a grinding his teeth issue and now his teeth are not aligning up which makes it harder for him to eat.  His patient states appetite and sleep are poor due to his anxiety.  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  Provider asked if he would be interested in the partial hospitalization program or the intensive outpatient program and patient stated that patient marks I do not see how those will work, however help the situation that is going on right now in my home, it will not change my living conditions or the fact that I ground my teeth".  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  Patient states he would love to be in a different environment, but states that he does not want to leave his house, and his wife is [place] and has gone back to her home [place] and asked him to come with her as he says he just cannot.  He seems like it would be too much to pack up and leave his life behind here. He reports increased anxiety related to discharging home as he states he does not want to go home due to deteriorating HVAC system in his apartment, but knows he cannot stay in the emergency department.   Patient states he is feeling down and depressed related to current life stressor and if he died and did not wake up, he would be okay with that.  Brad stated "sometimes I do not want to be here,But I don't want to hurt or harm myself."  Brad [name] denied suicidal or homicidal ideations.  Denied suicidal plan or intent.  Does report smoking marijuana a few months ago however, has discontinued due to increased anxiety.  Denies any other substance abuse or illicit drug use. Reports he has been medication compliant.  Denies auditory or visual hallucinations.   During evaluation Brad [name]  Brad [name] is laying in bed. he is alert/oriented x 3; calm/cooperative; and mood congruent with affect. Patient speech is clear and coherent, decreased volume.  Patient has good eye contact There is no indication that he is currently responding to internal/external stimuli or experiencing delusional thought content.  Patient denies suicidal/self-harm/homicidal ideation, psychosis, and paranoia.      Past Psychiatric History: Anxiety, MDD, insomnia  [place] Scale:  Flowsheet Row ED to Hosp-Admission (Current) from 10/19/2022 in [place] Most recent reading at  10/21/2022  5:10 PM ED from 10/19/2022 in [facility] Emergency Department at [facility] Most recent reading at 10/19/2022  1:17 AM ED from 10/18/2022 in [facility] Emergency Department at [facility] Most recent reading at 10/18/2022  8:09 PM  C-SSRS RISK CATEGORY Error: Q6 is Yes, you must answer 7 No Risk No Risk       Past Medical History:  Past Medical History:  Diagnosis Date   Allergy    Anxiety    Depression    DJD (degenerative joint disease), cervical    postiton with pillow under knees, cant turn neck    Dysentery, amebic, acute 1981   GERD (gastroesophageal reflux disease)    H/O bronchitis    H/O malaria 1984   Hearing loss    bilateral    Hypertension    labile Blood pressure   Inguinal hernia    Insomnia    early morning awakening   MVP (mitral valve prolapse)    "no problems"   Perianal pain    Personal history of colonic polyps    Tinnitus    right ear    Past Surgical History:  Procedure Laterality Date   CARPAL TUNNEL RELEASE  10/06, 5/10   right wrist    CARPAL TUNNEL RELEASE  3/10   left wrist    cervical spine discectomy   09/2005   COLONOSCOPY WITH PROPOFOL N/A 10/21/2015   Procedure: COLONOSCOPY WITH PROPOFOL;  Surgeon: [name], MD;  Location: [facility];  Service: Endoscopy;  Laterality: N/A;   INGUINAL HERNIA REPAIR  08/16/2011   Procedure: HERNIA REPAIR INGUINAL ADULT;  Surgeon: [name], MD;  Location: [place];  Service: General;  Laterality: Left;   INGUINAL HERNIA REPAIR Right 05/03/2014   Procedure: OPEN RIGHT INGUINAL HERNIA REPAIR WITH MESH;  Surgeon: [name], MD;  Location: [facility];  Service: General;  Laterality: Right;   INGUINAL HERNIA REPAIR Left 08/20/2022   Procedure: HERNIA REPAIR INGUINAL ADULT;  Surgeon: [name], MD;  Location: [facility];  Service: General;  Laterality: Left;   INSERTION OF MESH Right 05/03/2014   Procedure: INSERTION OF MESH;  Surgeon: [name], MD;   Location: [facility];  Service: General;  Laterality: Right;   TONSILLECTOMY  1960   Family History:  Family History  Problem Relation Age of Onset   Cancer Mother        breast   Intracerebral hemorrhage Father      Social History:  Social History   Substance and Sexual Activity  Alcohol Use Yes   Comment: 2 wine daily     Social History   Substance and Sexual Activity  Drug Use Yes   Types: Marijuana   Comment: weekend use    Social History   Socioeconomic History   Marital status: Married    Spouse name: Not on file   Number of children: 2   Years of education: Not  on file   Highest education level: Professional school degree (e.g., MD, DDS, DVM, JD)  Occupational History   Occupation: psychologist  Tobacco Use   Smoking status: Never   Smokeless tobacco: Never  Vaping Use   Vaping Use: Never used  Substance and Sexual Activity   Alcohol use: Yes    Comment: 2 wine daily   Drug use: Yes    Types: Marijuana    Comment: weekend use   Sexual activity: Yes  Other Topics Concern   Not on file  Social History Narrative   Lives in single level home. He is a self employed Investment banker, operational.     Social Determinants of Health   Financial Resource Strain: Not on file  Food Insecurity: No Food Insecurity (10/20/2022)   Hunger Vital Sign    Worried About Running Out of Food in the Last Year: Never true    [place] in the Last Year: Never true  Transportation Needs: No Transportation Needs (10/20/2022)   PRAPARE - Hydrologist (Medical): No    Lack of Transportation (Non-Medical): No  Physical Activity: Not on file  Stress: Not on file  Social Connections: Not on file    Sleep: Fair  Appetite:  Fair  Current Medications: Current Facility-Administered Medications  Medication Dose Route Frequency Provider Last Rate Last Admin   acetaminophen (TYLENOL) tablet 650 mg  650 mg Oral Q4H PRN Mansy, Jan A, MD   650 mg at  10/21/22 [id number]   ALPRAZolam Duanne Moron) tablet 1 mg  1 mg Oral BID PRN Raiford Noble Latif, DO       amLODipine (NORVASC) tablet 5 mg  5 mg Oral Daily Mansy, Jan A, MD   5 mg at 10/21/22 0916   arformoterol (BROVANA) nebulizer solution 15 mcg  15 mcg Nebulization BID Erick Colace, NP   15 mcg at 10/21/22 0813   aspirin EC tablet 81 mg  81 mg Oral Daily Mansy, Jan A, MD   81 mg at 10/21/22 0914   atorvastatin (LIPITOR) tablet 10 mg  10 mg Oral Once per day on Mon Wed Fri Mansy, Jan A, MD   10 mg at 10/20/22 0855   budesonide (PULMICORT) nebulizer solution 0.5 mg  0.5 mg Nebulization BID Erick Colace, NP   0.5 mg at 10/21/22 0813   busPIRone (BUSPAR) tablet 20 mg  20 mg Oral TID Mansy, Jan A, MD   20 mg at 10/21/22 0913   cholecalciferol (VITAMIN D3) 25 MCG (1000 UNIT) tablet 5,000 Units  5,000 Units Oral Daily Mansy, Jan A, MD   5,000 Units at 10/21/22 0912   dicyclomine (BENTYL) tablet 20 mg  20 mg Oral TID PRN Mansy, Jan A, MD       docusate sodium (COLACE) capsule 100 mg  100 mg Oral Q12H Mansy, Jan A, MD   100 mg at 10/21/22 1316   enoxaparin (LOVENOX) injection 40 mg  40 mg Subcutaneous Q24H Mansy, Jan A, MD   40 mg at 10/21/22 [id number]   LORazepam (ATIVAN) tablet 1 mg  1 mg Oral BID Mansy, Jan A, MD   1 mg at 10/21/22 0915   losartan (COZAAR) tablet 100 mg  100 mg Oral Daily Mansy, Jan A, MD   100 mg at 10/21/22 0915   magnesium hydroxide (MILK OF MAGNESIA) suspension 30 mL  30 mL Oral Daily PRN Mansy, Jan A, MD       mirtazapine (REMERON) tablet 45 mg  45 mg Oral QHS Raiford Noble [place], [place]   45 mg at 10/20/22 2114   morphine (PF) 2 MG/ML injection 2 mg  2 mg Intravenous Q2H PRN Mansy, Jan A, MD   2 mg at 10/20/22 0310   multivitamin with minerals tablet 1 tablet  1 tablet Oral Daily Mansy, Jan A, MD   1 tablet at 10/21/22 0912   nitroGLYCERIN (NITROSTAT) SL tablet 0.4 mg  0.4 mg Sublingual Q5 min PRN Mansy, Jan A, MD       omega-3 acid ethyl esters (LOVAZA) capsule 1 g  1 capsule Oral Daily  Mansy, Jan A, MD   1 g at 10/21/22 0911   ondansetron [facility]) injection 4 mg  4 mg Intravenous Q6H PRN Mansy, Jan A, MD       polyethylene glycol (MIRALAX / GLYCOLAX) packet 17 g  17 g Oral BID Raiford Noble [place], DO   17 g at 10/21/22 [id number]   pregabalin (LYRICA) capsule 75 mg  75 mg Oral BID Mansy, Jan A, MD   75 mg at 10/21/22 0915   senna-docusate (Senokot-S) tablet 1 tablet  1 tablet Oral QHS PRN Mansy, Jan A, MD       traZODone (DESYREL) tablet 25 mg  25 mg Oral QHS PRN Mansy, Jan A, MD       traZODone (DESYREL) tablet 50 mg  50 mg Oral QHS Mansy, Jan A, MD   50 mg at 10/20/22 2115   zinc sulfate capsule 220 mg  220 mg Oral Daily Eudelia Bunch, RPH   220 mg at 10/21/22 0911   zolpidem (AMBIEN) tablet 5 mg  5 mg Oral QHS PRN Mansy, Jan A, MD   5 mg at 10/20/22 2121    Lab Results:  Results for orders placed or performed during the hospital encounter of 10/19/22 (from the past 48 hour(s))  Troponin I (High Sensitivity)     Status: Abnormal   Collection Time: 10/19/22  5:50 PM  Result Value Ref Range   Troponin I (High Sensitivity) 22 (H) <18 ng/L    Comment: (NOTE) Elevated high sensitivity troponin I (hsTnI) values and significant  changes across serial measurements may suggest ACS but many other  chronic and acute conditions are known to elevate hsTnI results.  Refer to the "Links" section for chest pain algorithms and additional  guidance. Performed at [facility], [place] [address] [place], [place] [address]   Troponin I (High Sensitivity)     Status: Abnormal   Collection Time: 10/19/22  9:28 PM  Result Value Ref Range   Troponin I (High Sensitivity) 29 (H) <18 ng/L    Comment: (NOTE) Elevated high sensitivity troponin I (hsTnI) values and significant  changes across serial measurements may suggest ACS but many other  chronic and acute conditions are known to elevate hsTnI results.  Refer to the "Links" section for chest pain algorithms and additional   guidance. Performed at [facility], [place] [address], [place], [place] [address]   CBC with Differential     Status: None   Collection Time: 10/19/22 11:24 PM  Result Value Ref Range   WBC 8.9 4.0 - 10.5 K/uL   RBC 4.76 4.22 - 5.81 MIL/uL   Hemoglobin 15.1 13.0 - 17.0 g/dL   HCT 42.7 39.0 - 52.0 %   MCV 89.7 80.0 - 100.0 fL   MCH 31.7 26.0 - 34.0 pg   MCHC 35.4 30.0 - 36.0 g/dL   RDW 12.5 11.5 -  15.5 %   Platelets 211 150 - 400 K/uL   nRBC 0.0 0.0 - 0.2 %   Neutrophils Relative % 67 %   Neutro Abs 5.9 1.7 - 7.7 K/uL   Lymphocytes Relative 22 %   Lymphs Abs 1.9 0.7 - 4.0 K/uL   Monocytes Relative 11 %   Monocytes Absolute 1.0 0.1 - 1.0 K/uL   Eosinophils Relative 0 %   Eosinophils Absolute 0.0 0.0 - 0.5 K/uL   Basophils Relative 0 %   Basophils Absolute 0.0 0.0 - 0.1 K/uL   Immature Granulocytes 0 %   Abs Immature Granulocytes 0.03 0.00 - 0.07 K/uL    Comment: Performed at [facility], [place] [address], [place], [place] [id number]  Basic metabolic panel     Status: Abnormal   Collection Time: 10/19/22 11:24 PM  Result Value Ref Range   Sodium 127 (L) 135 - 145 mmol/L   Potassium 3.7 3.5 - 5.1 mmol/L   Chloride 95 (L) 98 - 111 mmol/L   CO2 22 22 - 32 mmol/L   Glucose, Bld 111 (H) 70 - 99 mg/dL    Comment: Glucose reference range applies only to samples taken after fasting for at least 8 hours.   BUN 22 8 - 23 mg/dL   Creatinine, Ser 0.91 0.61 - 1.24 mg/dL   Calcium 9.5 8.9 - 10.3 mg/dL   GFR, Estimated >60 >60 mL/min    Comment: (NOTE) Calculated using the CKD-EPI Creatinine Equation (2021)    Anion gap 10 5 - 15    Comment: Performed at [facility], [place] [address], [place], [place] [address]  D-dimer, quantitative     Status: Abnormal   Collection Time: 10/19/22 11:24 PM  Result Value Ref Range   D-Dimer, Quant 1.54 (H) 0.00 - 0.50 ug/mL-FEU    Comment: (NOTE) At the manufacturer cut-off value of 0.5 g/mL  FEU, this assay has a negative predictive value of 95-100%.This assay is intended for use in conjunction with a clinical pretest probability (PTP) assessment model to exclude pulmonary embolism (PE) and deep venous thrombosis (DVT) in outpatients suspected of PE or DVT. Results should be correlated with clinical presentation. Performed at [facility], [place] [address], [place],  [address]   Comprehensive metabolic panel     Status: Abnormal   Collection Time: 10/21/22  4:12 AM  Result Value Ref Range   Sodium 135 135 - 145 mmol/L    Comment: DELTA CHECK NOTED   Potassium 3.8 3.5 - 5.1 mmol/L   Chloride 104 98 - 111 mmol/L   CO2 24 22 - 32 mmol/L   Glucose, Bld 90 70 - 99 mg/dL    Comment: Glucose reference range applies only to samples taken after fasting for at least 8 hours.   BUN 15 8 - 23 mg/dL   Creatinine, Ser 0.81 0.61 - 1.24 mg/dL   Calcium 8.7 (L) 8.9 - 10.3 mg/dL   Total Protein 5.1 (L) 6.5 - 8.1 g/dL   Albumin 3.3 (L) 3.5 - 5.0 g/dL   AST 24 15 - 41 U/L   ALT 18 0 - 44 U/L   Alkaline Phosphatase 44 38 - 126 U/L   Total Bilirubin 0.7 0.3 - 1.2 mg/dL   GFR, Estimated >60 >60 mL/min    Comment: (NOTE) Calculated using the CKD-EPI Creatinine Equation (2021)    Anion gap 7 5 - 15    Comment: Performed at [facility], [place]  [address] [place], [place] [address]  CBC with Differential/Platelet     Status: Abnormal   Collection Time: 10/21/22  4:12 AM  Result Value Ref Range   WBC 5.2 4.0 - 10.5 K/uL   RBC 3.77 (L) 4.22 - 5.81 MIL/uL   Hemoglobin 11.8 (L) 13.0 - 17.0 g/dL   HCT 35.3 (L) 39.0 - 52.0 %   MCV 93.6 80.0 - 100.0 fL   MCH 31.3 26.0 - 34.0 pg   MCHC 33.4 30.0 - 36.0 g/dL   RDW 13.2 11.5 - 15.5 %   Platelets 144 (L) 150 - 400 K/uL   nRBC 0.0 0.0 - 0.2 %   Neutrophils Relative % 47 %   Neutro Abs 2.5 1.7 - 7.7 K/uL   Lymphocytes Relative 41 %   Lymphs Abs 2.2 0.7 - 4.0 K/uL   Monocytes Relative 9 %   Monocytes  Absolute 0.5 0.1 - 1.0 K/uL   Eosinophils Relative 2 %   Eosinophils Absolute 0.1 0.0 - 0.5 K/uL   Basophils Relative 1 %   Basophils Absolute 0.0 0.0 - 0.1 K/uL   Immature Granulocytes 0 %   Abs Immature Granulocytes 0.01 0.00 - 0.07 K/uL    Comment: Performed at [facility], [place] [address], [place], [place] [address]  Magnesium     Status: Abnormal   Collection Time: 10/21/22  4:12 AM  Result Value Ref Range   Magnesium 2.5 (H) 1.7 - 2.4 mg/dL    Comment: Performed at [facility], [place] [address] [place], [place] [address]  Phosphorus     Status: None   Collection Time: 10/21/22  4:12 AM  Result Value Ref Range   Phosphorus 3.4 2.5 - 4.6 mg/dL    Comment: Performed at [facility], [place] [address], [place], [place] [address]    Blood Alcohol level:  Lab Results  Component Value Date   [facility] <10 10/02/2022   ETH <10 06/02/2022    Physical Findings:  CIWA:    COWS:     Musculoskeletal:  Patient is observed resting in bed  Psychiatric Specialty Exam:  Presentation  General Appearance:  Casual  Eye Contact: Good  Speech: Clear and Coherent  Speech Volume: Normal  Handedness: Right   Mood and Affect  Mood: Anxious  Affect: Congruent   Thought Process  Thought Processes: Coherent  Descriptions of Associations:Intact  Orientation:Full (Time, Place and Person)  Thought Content:Obsessions  History of Schizophrenia/Schizoaffective disorder:No data recorded Duration of Psychotic Symptoms:No data recorded Hallucinations:Hallucinations: None  Ideas of Reference:None  Suicidal Thoughts:Suicidal Thoughts: Yes, Passive SI Passive Intent and/or Plan: Without Intent; Without Plan  Homicidal Thoughts:Homicidal Thoughts: No   Sensorium  Memory: Immediate Fair; Remote Good  Judgment: Fair  Insight: Fair   Materials engineer: Fair  Attention  Span: Fair  Recall: [employer] of Knowledge: Fair  Language: Good   Psychomotor Activity  Psychomotor Activity: Psychomotor Activity: Normal   Assets  Assets: Social Support   Sleep  Sleep: Sleep: Fair    Physical Exam: Physical Exam HENT:     Nose: Nose normal.  Cardiovascular:     Rate and Rhythm: Normal rate.  Musculoskeletal:        General: Normal range of motion.  Neurological:     Mental Status: He is alert.  Psychiatric:        Attention and Perception: Attention normal.        Mood and Affect: Mood is anxious.  Speech: Speech normal.        Behavior: Behavior is cooperative.        Thought Content: Thought content normal.        Cognition and Memory: Memory normal.    Review of Systems  Constitutional: Negative.   HENT: Negative.    Respiratory: Negative.    Musculoskeletal: Negative.   Psychiatric/Behavioral:  Positive for depression. The patient is nervous/anxious.    Blood pressure 113/79, pulse 66, temperature 98.1 F (36.7 C), resp. rate 20, height '5\' 9"'$  (1.753 m), weight 60 kg, SpO2 100 %. Body mass index is 19.53 kg/m.   Disposition: No evidence of imminent risk to self or others at present.   Patient does not meet criteria for psychiatric inpatient admission.  Spoke with him about PHP, and IOP, patient not interested did not feel that he could help him at this time. Supportive therapy provided about ongoing stressors. Discussed crisis plan, support from social network, calling 911, coming to the Emergency Department, and calling Suicide Hotline.   Siennah Barrasso MOTLEY-MANGRUM, PMHNP 10/21/2022, 5:11 PM

## 2022-10-21 NOTE — Care Management Obs Status (Signed)
[facility] NOTIFICATION   Patient Details  Name: Brad [name] MRN: [id number] Date of Birth: July 18, 1956   Medicare Observation Status Notification Given:  Yes    Bethann Berkshire, [place] 10/21/2022, 11:07 AM

## 2022-10-21 NOTE — Progress Notes (Signed)
Phone call placed to our office, we will contact him with hospital follow-up Erick Colace ACNP-BC [place] Pager # [phone] OR # [phone] if no answer

## 2022-10-21 NOTE — TOC Initial Note (Addendum)
Transition of Care [facility]) - Initial/Assessment Note    Patient Details  Name: Brad [name] MRN: [id number] Date of Birth: 1955/12/30  Transition of [facility]) CM/SW Contact:    Bethann Berkshire, [place] Phone Number: 10/21/2022, 12:00 PM  Clinical Narrative:                  CSW met with pt in response to [facility] consult regarding pt's living situation. Met with pt bedside and he provides details of living situation. He lives in a rental home that he has been in since 2021. He lives alone; wife is in [place] and doesn't plan on returning. States he is estranged from his son. He states that the duct work in the home is old and that it is exposing him to fiberglass in the air. He reports that the rental company is not doing anything about it. Rental company previously hired a 33rd party to evaluate the home and nothing came of that. Pt also explains fridge doesn't work. He states he is afraid to go home; he does not expand on any additional problems in the home. CSW provides pt with contact info for [place] program to assist with any hazards in his home.   CSW discusses other potential solutions such as moving or staying with friends/family(Pt's lease is not up until August). He does not consider moving as a preferred option. He then states he thinks he needs 24/7 care. He is independent and able to walk. CSW explained medicare does not cover personal care givers and that he would have to pay out of pocket for care givers or apply for medicaid. He states he wouldn't qualify for medicaid; he states he still has private duty providers list that [facility] gave him on a previous admission.   CSW and pt also discuss pt feeling overwhelmed and potential for outpatient therapist to assist in managing stress and problem solving. He is accepting of outpatient therapy provider list.   Expected Discharge Plan: Home/Self Care Barriers to Discharge: No Barriers Identified   Patient Goals and  CMS Choice            Expected Discharge Plan and Services       Living arrangements for the past 2 months: Single Family Home                                      Prior Living Arrangements/Services Living arrangements for the past 2 months: Single Family Home Lives with:: Self Patient language and need for interpreter reviewed:: Yes        Need for Family Participation in Patient Care: No (Comment) Care giver support system in place?: No (comment)   Criminal Activity/Legal Involvement Pertinent to Current Situation/Hospitalization: No - Comment as needed  Activities of Daily Living Home Assistive Devices/Equipment: None ADL Screening (condition at time of admission) Patient's cognitive ability adequate to safely complete daily activities?: Yes Is the patient deaf or have difficulty hearing?: No Does the patient have difficulty seeing, even when wearing glasses/contacts?: No Does the patient have difficulty concentrating, remembering, or making decisions?: No Patient able to express need for assistance with ADLs?: Yes Does the patient have difficulty dressing or bathing?: No Independently performs ADLs?: Yes (appropriate for developmental age) Does the patient have difficulty walking or climbing stairs?: No Weakness of Legs: None Weakness of Arms/Hands: None  Permission Sought/Granted  Emotional Assessment Appearance:: Appears stated age, Disheveled   Affect (typically observed): Quiet, Sad Orientation: : Oriented to Self, Oriented to Place, Oriented to  Time, Oriented to Situation      Admission diagnosis:  Atypical chest pain [R07.89] Chest pain [R07.9] Patient Active Problem List   Diagnosis Date Noted   Chest pain 10/20/2022   Essential hypertension 10/20/2022   Dyslipidemia 10/20/2022   Anxiety and depression 10/20/2022   Peripheral neuropathy 10/20/2022   Somatic complaints, multiple 10/20/2022   Generalized anxiety  disorder 08/23/2022   Constipation, chronic 08/23/2022   Incarcerated left inguinal hernia 08/20/2022   Low back pain 03/08/2022   Neuropathic pain 08/07/2019   MDD (major depressive disorder) 02/27/2019   Paresthesias 06/28/2018   Inguinal hernia, left-repair [facility] Dec 2012 09/10/2011   PCP:  Patient, No Pcp Per Pharmacy:   [facility] [id number] - [place], [place] [address] [place] [address] Phone: [phone] Fax: [phone]     Social Determinants of Health (SDOH) Social History: SDOH Screenings   Food Insecurity: No Food Insecurity (10/20/2022)  Housing: Low Risk  (10/20/2022)  Transportation Needs: No Transportation Needs (10/20/2022)  Utilities: Not At Risk (10/20/2022)  Alcohol Screen: Low Risk  (02/27/2019)  Tobacco Use: Low Risk  (10/20/2022)   SDOH Interventions:     Readmission Risk Interventions    08/23/2022    9:38 AM  Readmission Risk Prevention Plan  Post Dischage Appt Complete  Medication Screening Complete  Transportation Screening Complete

## 2022-10-21 NOTE — Progress Notes (Signed)
Patient received discharge orders to go home. RN went over with the patient the discharge paperwork and instructions. The only concerns the patient was having was the patient was still complaining of weakness, numbness, pins and needles feeling, and shocking sensations in the right arm radiating towards the right hand, as well as concerns about his home environment situation.   The MD/DO was already aware of the patient's complaint and discharged patient since patient was medically stable. MD/DO had put in an order for patient to get an MRI of the cervical spine prior to discharge, but patient declined. When RN went to give patient the discharge paperwork/instructions, patient told RN he wanted the MRI. RN contacted MD/DO and MD/DO said that patient can get the MRI outpatient and [facility] can take care of it. RN made patient aware.     TOC was aware of patient's living situation and met with patient and provided patient with resources.   Patient had no other questions regarding discharge instructions. RN provided patient with a bus pass to get home from being discharged from the hospital. Patient was stable upon discharge and was taken downstairs to wait upon transportation to get home.

## 2022-10-21 NOTE — Plan of Care (Signed)
  Problem: Activity: Goal: Ability to tolerate increased activity will improve Outcome: Progressing   Problem: Education: Goal: Knowledge of General Education information will improve Description: Including pain rating scale, medication(s)/side effects and non-pharmacologic comfort measures Outcome: Progressing   Problem: Activity: Goal: Risk for activity intolerance will decrease Outcome: Progressing   Problem: Pain Managment: Goal: General experience of comfort will improve Outcome: Progressing   Problem: Safety: Goal: Ability to remain free from injury will improve Outcome: Progressing   Problem: Skin Integrity: Goal: Risk for impaired skin integrity will decrease Outcome: Progressing

## 2022-10-21 NOTE — Progress Notes (Signed)
Notified MD/DO that patient was currently complaining of hand tremors, weakness, and numbness with pins and needles and shocking sensation in the right arm (more so in the right hand). Patient said he has never had the shocking sensation and pins and needles sensation before now. RN checked the patient's right hand grip and it was strong. The patient was claiming that it was not related to anxiety. The patient told RN that he took his IV out last night because he thinks it hit a nerve. The area where the IV had been showed ecchymosis and edema.   Due to the patient's complaints, the MD/DO reached out to Psych for re-evaluation prior to discharge.

## 2022-10-21 NOTE — Telephone Encounter (Signed)
Scheduled pt for f/u on 2/29 @ 11:30.

## 2022-10-22 ENCOUNTER — Encounter: Payer: Self-pay | Admitting: Internal Medicine

## 2022-10-25 LAB — HYPERSENSITIVITY PNEUMONITIS
A. Pullulans Abs: NEGATIVE
A.Fumigatus #1 Abs: NEGATIVE
Micropolyspora faeni, IgG: NEGATIVE
Pigeon Serum Abs: NEGATIVE
Thermoact. Saccharii: NEGATIVE
Thermoactinomyces vulgaris, IgG: NEGATIVE

## 2022-10-28 ENCOUNTER — Ambulatory Visit (INDEPENDENT_AMBULATORY_CARE_PROVIDER_SITE_OTHER): Payer: Medicare Other | Admitting: Nurse Practitioner

## 2022-10-28 ENCOUNTER — Encounter: Payer: Self-pay | Admitting: Nurse Practitioner

## 2022-10-28 VITALS — BP 118/70 | HR 65 | Ht 69.0 in | Wt 130.0 lb

## 2022-10-28 DIAGNOSIS — R0789 Other chest pain: Secondary | ICD-10-CM

## 2022-10-28 DIAGNOSIS — J4520 Mild intermittent asthma, uncomplicated: Secondary | ICD-10-CM

## 2022-10-28 DIAGNOSIS — J310 Chronic rhinitis: Secondary | ICD-10-CM | POA: Diagnosis not present

## 2022-10-28 DIAGNOSIS — R0602 Shortness of breath: Secondary | ICD-10-CM

## 2022-10-28 DIAGNOSIS — R1310 Dysphagia, unspecified: Secondary | ICD-10-CM

## 2022-10-28 LAB — POCT EXHALED NITRIC OXIDE: FeNO level (ppb): 24

## 2022-10-28 NOTE — Patient Instructions (Signed)
Restart Advair 1 puff Twice daily. Brush tongue and rinse mouth afterwards. Use this consistently, regardless of symptoms Albuterol inhaler 2 puffs every 6 hours as needed for shortness of breath or wheezing. This is your rescue inhaler to use only as needed Flonase nasal spray 2 sprays each nostril daily for postnasal drainage  Pulmonary function testing ordered today  Reschedule appointment with GI  Attend appointment with cardiology  Follow up after pulmonary function testing with Dr. Ander Slade. If symptoms do not improve or worsen, please contact office for sooner follow up or seek emergency care.

## 2022-10-28 NOTE — Progress Notes (Signed)
$'@Patient'L$  ID: Brad [name], male    DOB: Jan 17, 1956, 67 y.o.   MRN: [id number]  Chief Complaint  Patient presents with   Follow-up    Pt HFU he reports he jas never had any issues breathing so he is curious what is happening. He is having SOB and scratchy throat.   He reports that he lives in a rental and the air ducts are "disintegrating" he states that the landlord is not wanting to cover the repair.      Referring provider: No ref. provider found  HPI: 67 year old male, never smoker followed for presumed reactive airway disease. Past medical history significant for HTN, depression, anxiety, neuropathic pain, HLD.  He was admitted 10/20/2022-10/21/2022 due to atypical chest pain and dyspnea. He had been seen in the ED at least 20 times over the previous 3 months with numerous complaints. Cardiac workup was unremarkable. PCCM was consulted for further evaluation as well as psychiatry. He did express concerns regarding malfunctioning HVAC system and stated that his symptoms were always worse when he was in the home and improved once he left. Concern for HP vs reactive airway disease. CT scan did not support HP and his HP panel was negative. He was treated with inhaled steroid and bronchodilators with improvement. Plan to follow up with cardiology, psychiatry, and pulmonary outpatient.   TEST/EVENTS:  10/20/2022 CTA chest: Mild panchamber cardiomegaly.  Atherosclerosis and scattered CAD.  No LAD.  No acute process throughout the lungs.  Slightly dilated aortic root measuring 4 cm.  Kyphodextroscoliosis.  10/20/2022 echo: EF 60-65%. RV size and function nl. RA mildly dilated. Trivial MR.  10/29/2022: Today - hospital follow up Patient presents today for hospital follow up. He has not picked up the Advair that was previously prescribed so he has been off of all inhaled medications.  He has noticed that since he has been home he has felt more short winded, has increased dry cough and a lot of throat  clearing.  He also feels like he cannot breathe out of his right nostril but does not necessarily have much congestion.  He does notice some postnasal drainage at times.  He still has the similar chest tightness/discomfort that he had when he was evaluated in the hospital.  Not actively having chest pain during his visit.  Has noticed some rare episodes of wheezing.  Symptoms seem to be worse at night.  He also has some difficulties swallowing.  He denies any dysphonia, hemoptysis, fevers, chills, night sweats, palpitations, syncope, dizziness, lower extremity swelling, orthopnea.  He is currently dealing with some knee pain.  He had an injection yesterday.  Feels like the pain with walking may be contributing to his shortness of breath with exertion.  He was post to have a follow-up with GI but he missed this appointment.  He has upcoming appointment with cardiology.  Allergies  Allergen Reactions   Duloxetine Hcl Other (See Comments)    Manic emotions   Gabapentin Other (See Comments)    Unable to urinate, drowsiness   Trelegy Ellipta [Fluticasone-Umeclidin-Vilant] Other (See Comments)    Dry cracked lips and mouth   Trileptal [Oxcarbazepine] Other (See Comments)    Bad taste in his mouth    Immunization History  Administered Date(s) Administered   Hepatitis A, Adult 08/16/2014, 03/05/2015   Moderna Sars-Covid-2 Vaccination 10/22/2019, 11/20/2019   Tdap 08/30/2005   Typhoid Live 08/16/2014   Yellow Fever 08/16/2014    Past Medical History:  Diagnosis Date  Allergy    Anxiety    Depression    DJD (degenerative joint disease), cervical    postiton with pillow under knees, cant turn neck    Dysentery, amebic, acute 1981   GERD (gastroesophageal reflux disease)    H/O bronchitis    H/O malaria 1984   Hearing loss    bilateral    Hypertension    labile Blood pressure   Inguinal hernia    Insomnia    early morning awakening   MVP (mitral valve prolapse)    "no problems"    Perianal pain    Personal history of colonic polyps    Tinnitus    right ear    Tobacco History: Social History   Tobacco Use  Smoking Status Never  Smokeless Tobacco Never   Counseling given: Not Answered   Outpatient Medications Prior to Visit  Medication Sig Dispense Refill   albuterol (VENTOLIN HFA) 108 (90 Base) MCG/ACT inhaler Inhale 2 puffs into the lungs every 6 (six) hours as needed for wheezing or shortness of breath. 8 g 2   Alpha-Lipoic Acid 600 MG CAPS Take 600 mg by mouth daily.     amLODipine (NORVASC) 5 MG tablet Take 2 tablets (10 mg total) by mouth daily. (Patient taking differently: Take 5 mg by mouth daily.) 90 tablet 1   atorvastatin (LIPITOR) 10 MG tablet Take 10 mg by mouth 3 (three) times a week. MWF     busPIRone (BUSPAR) 10 MG tablet Take 20 mg by mouth 3 (three) times daily.      Cholecalciferol (VITAMIN D3) 125 MCG (5000 UT) TABS Take 5,000 Units by mouth daily.     Coenzyme Q10 50 MG CAPS Take 50 mg by mouth daily.     dicyclomine (BENTYL) 20 MG tablet Take 1 tablet (20 mg total) by mouth 3 (three) times daily as needed for spasms. 20 tablet 0   docusate sodium (COLACE) 100 MG capsule Take 1 capsule (100 mg total) by mouth every 12 (twelve) hours. 60 capsule 0   LORazepam (ATIVAN) 1 MG tablet Take 1 mg by mouth 2 (two) times daily.     losartan (COZAAR) 100 MG tablet Take 100 mg by mouth daily.     mirtazapine (REMERON) 45 MG tablet Take 1 tablet (45 mg total) by mouth at bedtime. 30 tablet 0   Multiple Vitamin (MULTIVITAMIN) tablet Take 1 tablet by mouth daily.     Omega-3 1000 MG CAPS Take 1,000 mg by mouth daily.     polyethylene glycol (MIRALAX) 17 g packet Take 17 g by mouth 2 (two) times daily. 14 each 0   pregabalin (LYRICA) 75 MG capsule Take 75 mg by mouth 2 (two) times daily.     senna-docusate (SENOKOT-S) 8.6-50 MG tablet Take 1 tablet by mouth at bedtime as needed for mild constipation. 30 tablet 0   traZODone (DESYREL) 50 MG tablet Take  50-100 mg by mouth at bedtime as needed for sleep.     zaleplon (SONATA) 10 MG capsule Take 10-20 mg by mouth at bedtime as needed for sleep.     zinc gluconate 50 MG tablet Take 50 mg by mouth daily.     fluticasone-salmeterol (ADVAIR DISKUS) 100-50 MCG/ACT AEPB Inhale 1 puff into the lungs 2 (two) times daily. 60 each 0   No facility-administered medications prior to visit.     Review of Systems:   Constitutional: No weight loss or gain, night sweats, fevers, chills, or lassitude. +fatigue  HEENT: No  headaches, tooth/dental problems, or sore throat. No sneezing, itching, ear ache. + nasal congestion, post nasal drip, difficulty swallowing, CV:  No chest pain, orthopnea, PND, swelling in lower extremities, anasarca, dizziness, palpitations, syncope Resp: +shortness of breath with exertion; chest congestion; cough; wheeze. No hemoptysis.  No chest wall deformity GI: +occasional abd pain (unchanged). No heartburn, indigestion, nausea, vomiting, diarrhea, change in bowel habits, loss of appetite, bloody stools.  Skin: No rash, lesions, ulcerations MSK:  +right knee pain. No joint swelling.   Neuro: No dizziness or lightheadedness.  Psych: No depression. +anxiety. No SI/HI    Physical Exam:  BP 118/70   Pulse 65   Ht '5\' 9"'$  (1.753 m)   Wt 130 lb (59 kg)   SpO2 100%   BMI 19.20 kg/m   GEN: Pleasant, interactive, well-kempt; in no acute distress. HEENT:  Normocephalic and atraumatic. PERRLA. Sclera white. Nasal turbinates erythematous, moist and patent bilaterally. No rhinorrhea present. Oropharynx pink and moist, without exudate or edema. No lesions, ulcerations NECK:  Supple w/ fair ROM. No JVD present. Normal carotid impulses w/o bruits. Thyroid symmetrical with no goiter or nodules palpated. No lymphadenopathy.   CV: RRR, no m/r/g, no peripheral edema. Pulses intact, +2 bilaterally. No cyanosis, pallor or clubbing. PULMONARY:  Unlabored, regular breathing. Clear bilaterally A&P w/o  wheezes/rales/rhonchi. No accessory muscle use.  GI: BS present and normoactive. Soft, non-tender to palpation. No organomegaly or masses detected.  MSK: No erythema, warmth or tenderness. Cap refil <2 sec all extrem. No deformities or joint swelling noted. Muscle wasting. Neuro: A/Ox3. No focal deficits noted.   Skin: Warm, no lesions or rashe Psych: Anxious. Judgement and thought content appropriate.     Lab Results:  CBC    Component Value Date/Time   WBC 5.2 10/21/2022 0412   RBC 3.77 (L) 10/21/2022 0412   HGB 11.8 (L) 10/21/2022 0412   HCT 35.3 (L) 10/21/2022 0412   PLT 144 (L) 10/21/2022 0412   MCV 93.6 10/21/2022 0412   MCH 31.3 10/21/2022 0412   MCHC 33.4 10/21/2022 0412   RDW 13.2 10/21/2022 0412   LYMPHSABS 2.2 10/21/2022 0412   MONOABS 0.5 10/21/2022 0412   EOSABS 0.1 10/21/2022 0412   BASOSABS 0.0 10/21/2022 0412    BMET    Component Value Date/Time   NA 135 10/21/2022 0412   K 3.8 10/21/2022 0412   CL 104 10/21/2022 0412   CO2 24 10/21/2022 0412   GLUCOSE 90 10/21/2022 0412   BUN 15 10/21/2022 0412   CREATININE 0.81 10/21/2022 0412   CALCIUM 8.7 (L) 10/21/2022 0412   GFRNONAA >60 10/21/2022 0412   GFRAA >60 03/01/2019 1816    BNP No results found for: "BNP"   Imaging:  VAS [place] UPPER EXTREMITY VENOUS DUPLEX  Result Date: 10/20/2022 UPPER VENOUS STUDY  Patient Name:  Brad [name]  Date of Exam:   10/20/2022 Medical Rec #: [id number]    Accession #:    [id number] Date of Birth: [birth year]   Patient Gender: M Patient Age:   68 years Exam Location:  [facility] Procedure:      VAS [place] UPPER EXTREMITY VENOUS DUPLEX Referring Phys: Raiford Noble --------------------------------------------------------------------------------  Indications: Pain and brusing Other Indications: IV infiltration. Limitations: Poor ultrasound/tissue interface and body habitus. Comparison Study: No previous exams Performing Technologist: Jody Hill RVT, RDMS  Examination  Guidelines: A complete evaluation includes B-mode imaging, spectral Doppler, color Doppler, and power Doppler as needed of all accessible portions of each vessel. Bilateral testing  is considered an integral part of a complete examination. Limited examinations for reoccurring indications may be performed as noted.  Right Findings: +----------+------------+---------+-----------+----------+-------------------+ RIGHT     CompressiblePhasicitySpontaneousProperties      Summary       +----------+------------+---------+-----------+----------+-------------------+ IJV           Full       Yes       Yes                                  +----------+------------+---------+-----------+----------+-------------------+ Subclavian    Full       Yes       Yes                                  +----------+------------+---------+-----------+----------+-------------------+ Axillary      Full       Yes       Yes                                  +----------+------------+---------+-----------+----------+-------------------+ Brachial      Full       Yes       Yes                                  +----------+------------+---------+-----------+----------+-------------------+ Radial        Full                                                      +----------+------------+---------+-----------+----------+-------------------+ Ulnar         Full                                                      +----------+------------+---------+-----------+----------+-------------------+ Cephalic      None       No        No                Age Indeterminate  +----------+------------+---------+-----------+----------+-------------------+ Basilic       Full                                  not well visualized +----------+------------+---------+-----------+----------+-------------------+  Left Findings: +----------+------------+---------+-----------+----------+-------+ LEFT       CompressiblePhasicitySpontaneousPropertiesSummary +----------+------------+---------+-----------+----------+-------+ Subclavian    Full       Yes       Yes                      +----------+------------+---------+-----------+----------+-------+  Summary:  Right: No evidence of deep vein thrombosis in the upper extremity. Findings consistent with age indeterminate superficial vein thrombosis involving the right cephalic vein.  Left: No evidence of thrombosis in the subclavian.  *See table(s) above for measurements and observations.  Diagnosing physician: Jamelle Haring Electronically signed by Jamelle Haring on 10/20/2022 at 10:06:13 PM.    Final    [place] RT  UPPER EXTREM LTD SOFT TISSUE NON VASCULAR  Result Date: 10/20/2022 CLINICAL DATA:  Pain and bruising at injection sites of right proximal posterior forearm. EXAM: ULTRASOUND LEFT UPPER EXTREMITY LIMITED TECHNIQUE: Ultrasound examination of the upper extremity soft tissues was performed in the area of clinical concern. COMPARISON:  None Available. FINDINGS: Grayscale and color flow images performed of the area of interest of the posterior right forearm. No abnormal fluid collection or mass was seen. Comparison images were performed of the left posterior forearm without significant asymmetry. IMPRESSION: No abnormal fluid collection or mass was seen in the area of interest of the posterior right forearm. Electronically Signed   By: Yvonne Kendall M.D.   On: 10/20/2022 18:23   ECHOCARDIOGRAM COMPLETE  Result Date: 10/20/2022    ECHOCARDIOGRAM REPORT   Patient Name:   Brad [name] [facility] Date of Exam: 10/20/2022 Medical Rec #:  [id number]   Height:       69.0 in Accession #:    [id number]  Weight:       132.3 lb Date of Birth:  03-23-1956  BSA:          1.733 m Patient Age:    27 years    BP:           133/88 mmHg Patient Gender: M           HR:           71 bpm. Exam Location:  Inpatient Procedure: 2D Echo, Color Doppler and Cardiac Doppler Indications:    Chest  pain  History:        Patient has no prior history of Echocardiogram examinations.                 Mitral Valve Prolapse; Risk Factors:Hypertension.  Sonographer:    Eartha Inch Referring Phys: [id number] LATIF [facility]  Sonographer Comments: Image acquisition challenging due to patient body habitus and Image acquisition challenging due to respiratory motion. IMPRESSIONS  1. Left ventricular ejection fraction, by estimation, is 60 to 65%. The left ventricle has normal function. The left ventricle has no regional wall motion abnormalities. Left ventricular diastolic parameters were normal.  2. Right ventricular systolic function is normal. The right ventricular size is normal.  3. Right atrial size was mildly dilated.  4. Mild bi leaflet prolapse with . The mitral valve is myxomatous. Trivial mitral valve regurgitation. No evidence of mitral stenosis.  5. The aortic valve is tricuspid. Aortic valve regurgitation is not visualized. No aortic stenosis is present.  6. The inferior vena cava is normal in size with greater than 50% respiratory variability, suggesting right atrial pressure of 3 mmHg. FINDINGS  Left Ventricle: Left ventricular ejection fraction, by estimation, is 60 to 65%. The left ventricle has normal function. The left ventricle has no regional wall motion abnormalities. The left ventricular internal cavity size was normal in size. There is  no left ventricular hypertrophy. Left ventricular diastolic parameters were normal. Right Ventricle: The right ventricular size is normal. No increase in right ventricular wall thickness. Right ventricular systolic function is normal. Left Atrium: Left atrial size was normal in size. Right Atrium: Right atrial size was mildly dilated. Pericardium: There is no evidence of pericardial effusion. Mitral Valve: Mild bi leaflet prolapse with. The mitral valve is myxomatous. Trivial mitral valve regurgitation. No evidence of mitral valve stenosis. Tricuspid Valve: The  tricuspid valve is normal in structure. Tricuspid valve regurgitation is mild . No evidence of tricuspid stenosis. Aortic Valve: The aortic  valve is tricuspid. Aortic valve regurgitation is not visualized. No aortic stenosis is present. Pulmonic Valve: The pulmonic valve was normal in structure. Pulmonic valve regurgitation is not visualized. No evidence of pulmonic stenosis. Aorta: The aortic root is normal in size and structure. Venous: The inferior vena cava is normal in size with greater than 50% respiratory variability, suggesting right atrial pressure of 3 mmHg. IAS/Shunts: No atrial level shunt detected by color flow Doppler.  LEFT VENTRICLE PLAX 2D LVIDd:         5.00 cm   Diastology LVIDs:         3.20 cm   LV e' medial:  9.36 cm/s LV PW:         0.90 cm   LV e' lateral: 9.57 cm/s LV IVS:        0.70 cm LVOT diam:     2.10 cm LV SV:         69 LV SV Index:   40 LVOT Area:     3.46 cm  RIGHT VENTRICLE             IVC RV S prime:     11.20 cm/s  IVC diam: 0.80 cm TAPSE (M-mode): 2.0 cm LEFT ATRIUM             Index        RIGHT ATRIUM           Index LA diam:        3.50 cm 2.02 cm/m   RA Area:     27.40 cm LA Vol (A2C):   65.3 ml 37.68 ml/m  RA Volume:   99.20 ml  57.24 ml/m LA Vol (A4C):   46.1 ml 26.60 ml/m LA Biplane Vol: 57.1 ml 32.95 ml/m  AORTIC VALVE LVOT Vmax:   99.20 cm/s LVOT Vmean:  63.500 cm/s LVOT VTI:    0.198 m  AORTA Ao Root diam: 3.20 cm Ao Asc diam:  3.20 cm TRICUSPID VALVE TR Peak grad:   14.6 mmHg TR Vmax:        191.00 cm/s  SHUNTS Systemic VTI:  0.20 m Systemic Diam: 2.10 cm Jenkins Rouge MD Electronically signed by Jenkins Rouge MD Signature Date/Time: 10/20/2022/5:27:11 PM    Final    DG Abd 1 View  Result Date: 10/20/2022 CLINICAL DATA:  History of hernia repair with abdominal pain EXAM: ABDOMEN - 1 VIEW COMPARISON:  Abdominal radiograph dated 09/03/2022 FINDINGS: Nonobstructive bowel gas pattern. No free air or pneumatosis. Moderate volume stool throughout the colon. No  abnormal radio-opaque calculi or mass effect. No acute or substantial osseous abnormality. The sacrum and coccyx are partially obscured by overlying bowel contents. Partially imaged lung bases are clear. IMPRESSION: Nonobstructive bowel gas pattern. Moderate volume stool throughout the colon. Electronically Signed   By: Darrin Nipper M.D.   On: 10/20/2022 14:56   CT Angio Chest PE W and/or Wo Contrast  Result Date: 10/20/2022 CLINICAL DATA:  Pulmonary embolism suspected, high probability. Elevated D-dimer. EXAM: CT ANGIOGRAPHY CHEST WITH CONTRAST TECHNIQUE: Multidetector CT imaging of the chest was performed using the standard protocol during bolus administration of intravenous contrast. Multiplanar CT image reconstructions and MIPs were obtained to evaluate the vascular anatomy. RADIATION DOSE REDUCTION: This exam was performed according to the departmental dose-optimization program which includes automated exposure control, adjustment of the mA and/or kV according to patient size and/or use of iterative reconstruction technique. CONTRAST:  10m OMNIPAQUE IOHEXOL 350 MG/ML SOLN COMPARISON:  Portable chest yesterday, PA  Lat 10/11/2022, CTA chest 08/26/2022 FINDINGS: Cardiovascular: Mild panchamber cardiomegaly is again noted. There is no venous dilatation. The pulmonary arteries are normal in caliber without visible emboli. There is aortic atherosclerosis and scattered calcification in the LAD and circumflex coronary arteries. The aortic root at the sinuses of Valsalva is slightly dilated at 4.0 cm. The remaining aorta is within normal caliber limits. No aortic dissection or stenosis is seen. The great vessels are not opacified but are normal caliber. Mediastinum/Nodes: No enlarged mediastinal, hilar, or axillary lymph nodes. Thyroid gland, trachea, and esophagus demonstrate no significant findings. Lungs/Pleura: There is mild breathing motion artifact through the bases. No pulmonary infiltrate or nodule is seen. No  pleural effusion or pneumothorax. Upper Abdomen: No acute abnormality. Musculoskeletal: There is mild thoracic kyphodextroscoliosis and degenerative changes. Multilevel lower cervical fusion plating is partially visible, extending to and including T1. Mild osteopenia. No suspicious bone lesions or chest wall abnormality. Review of the MIP images confirms the above findings. IMPRESSION: 1. No acute chest CT or CTA findings. 2. Aortic and coronary artery atherosclerosis. 3. Mild cardiomegaly. 4. Slightly dilated aortic root at the sinuses of Valsalva measuring 4 cm. Recommend annual imaging followup by CTA or MRA. This recommendation follows 2010 ACCF/AHA/AATS/ACR/ASA/SCA/SCAI/SIR/STS/SVM Guidelines for the Diagnosis and Management of Patients with Thoracic Aortic Disease. Circulation. 2010; [id number]. Aortic aneurysm NOS (ICD10-I71.9) 5. Kyphodextroscoliosis, degenerative and postsurgical changes and osteopenia. Electronically Signed   By: Telford Nab M.D.   On: 10/20/2022 00:38   DG CHEST PORT 1 VIEW  Result Date: 10/19/2022 CLINICAL DATA:  Chest pain EXAM: PORTABLE CHEST 1 VIEW COMPARISON:  10/11/2022 FINDINGS: No acute airspace disease or effusion. Normal cardiomediastinal silhouette. No pneumothorax. Air distended bowel beneath the right hemidiaphragm. IMPRESSION: No active disease. Air distended bowel beneath the right hemidiaphragm. Electronically Signed   By: Donavan Foil M.D.   On: 10/19/2022 18:06   DG Shoulder Right  Result Date: 10/13/2022 CLINICAL DATA:  Posterior shoulder pain. EXAM: RIGHT SHOULDER - 2+ VIEW COMPARISON:  None Available. FINDINGS: AP and Y-views obtained portably are submitted. There is some rotation on the Y-view, and humeral head may be slightly subluxed anteriorly relative to the glenoid with resulting narrowing of the subcoracoid space. No frank dislocation. No evidence of acute fracture. Mild glenohumeral and acromioclavicular degenerative changes. There are  postsurgical changes in the lower cervical spine. IMPRESSION: Possible mild anterior subluxation of the humeral head relative to the glenoid. No frank dislocation or acute fracture. Electronically Signed   By: Richardean Sale M.D.   On: 10/13/2022 17:51   DG Chest 2 View  Result Date: 10/13/2022 CLINICAL DATA:  Difficulty swallowing. EXAM: CHEST - 2 VIEW COMPARISON:  October 11, 2022. FINDINGS: Stable cardiomediastinal silhouette. Both lungs are clear. The visualized skeletal structures are unremarkable. IMPRESSION: No active cardiopulmonary disease. Electronically Signed   By: Marijo Conception M.D.   On: 10/13/2022 16:32   DG Neck Soft Tissue  Result Date: 10/13/2022 CLINICAL DATA:  Difficulty swallowing. EXAM: NECK SOFT TISSUES - 1+ VIEW COMPARISON:  None Available. FINDINGS: There is no evidence of retropharyngeal soft tissue swelling or epiglottic enlargement. The cervical airway is unremarkable. Patient is status post surgical anterior fusion of C5-6, C6-7 and C7-T1. IMPRESSION: Postsurgical changes as described above. No other definite abnormality seen. Electronically Signed   By: Marijo Conception M.D.   On: 10/13/2022 16:30   CT Abdomen Pelvis W Contrast  Result Date: 10/13/2022 CLINICAL DATA:  Bowel obstruction suspected, abdominal pain EXAM: CT  ABDOMEN AND PELVIS WITH CONTRAST TECHNIQUE: Multidetector CT imaging of the abdomen and pelvis was performed using the standard protocol following bolus administration of intravenous contrast. RADIATION DOSE REDUCTION: This exam was performed according to the departmental dose-optimization program which includes automated exposure control, adjustment of the mA and/or kV according to patient size and/or use of iterative reconstruction technique. CONTRAST:  157m OMNIPAQUE IOHEXOL 300 MG/ML  SOLN COMPARISON:  10/11/2022 FINDINGS: Lower chest: No acute abnormality. Hepatobiliary: No solid liver abnormality is seen. No gallstones, gallbladder wall thickening,  or biliary dilatation. Pancreas: Unremarkable. No pancreatic ductal dilatation or surrounding inflammatory changes. Spleen: Normal in size without significant abnormality. Adrenals/Urinary Tract: Adrenal glands are unremarkable. Kidneys are normal, without renal calculi, solid lesion, or hydronephrosis. Bladder is unremarkable. Stomach/Bowel: Stomach is within normal limits. Appendix not clearly visualized. No evidence of bowel wall thickening, distention, or inflammatory changes. Sigmoid diverticula. Moderate burden of stool throughout the colon. Vascular/Lymphatic: Aortic atherosclerosis. No enlarged abdominal or pelvic lymph nodes. Reproductive: No mass or other significant abnormality. Other: No abdominal wall hernia or abnormality. No ascites. Musculoskeletal: No acute or significant osseous findings. IMPRESSION: 1. No acute CT findings of the abdomen or pelvis to explain abdominal pain. Specifically, no evidence of bowel obstruction. 2. Sigmoid diverticulosis without evidence of acute diverticulitis. 3. Moderate burden of stool throughout the colon. Aortic Atherosclerosis (ICD10-I70.0). Electronically Signed   By: ADelanna AhmadiM.D.   On: 10/13/2022 15:42   UKoreaSCROTUM W/DOPPLER  Result Date: 10/13/2022 CLINICAL DATA:  Left testicular pain.  Evaluate for torsion. EXAM: SCROTAL ULTRASOUND DOPPLER ULTRASOUND OF THE TESTICLES TECHNIQUE: Complete ultrasound examination of the testicles, epididymis, and other scrotal structures was performed. Color and spectral Doppler ultrasound were also utilized to evaluate blood flow to the testicles. COMPARISON:  None available FINDINGS: Right testicle Measurements: 3.8 x 1.8 x 2.3 cm. No mass or microlithiasis visualized. Left testicle Measurements: 4.1 x 2.4 x 2.5 cm. No mass or microlithiasis visualized. Right epididymis:  Normal in size and appearance. Left epididymis:  Normal in size and appearance. Hydrocele:  Small bilateral hydroceles, larger on the left.  Varicocele:  None visualized. Pulsed Doppler interrogation of both testes demonstrates normal low resistance arterial and venous waveforms bilaterally. IMPRESSION: 1. No evidence of testicular torsion. 2. Small bilateral hydroceles, larger on the left. Electronically Signed   By: FMiachel RouxM.D.   On: 10/13/2022 15:20   CT Abdomen Pelvis W Contrast  Result Date: 10/11/2022 CLINICAL DATA:  Bowel obstruction suspected. Left-sided abdominal pain. EXAM: CT ABDOMEN AND PELVIS WITH CONTRAST TECHNIQUE: Multidetector CT imaging of the abdomen and pelvis was performed using the standard protocol following bolus administration of intravenous contrast. RADIATION DOSE REDUCTION: This exam was performed according to the departmental dose-optimization program which includes automated exposure control, adjustment of the mA and/or kV according to patient size and/or use of iterative reconstruction technique. CONTRAST:  76mOMNIPAQUE IOHEXOL 350 MG/ML SOLN COMPARISON:  CT abdomen and pelvis 09/26/2022 FINDINGS: Lower chest: No acute abnormality. Hepatobiliary: Subcentimeter cysts in the liver appear unchanged. Gallbladder and bile ducts are within normal limits. Pancreas: Unremarkable. No pancreatic ductal dilatation or surrounding inflammatory changes. Spleen: Normal in size without focal abnormality. Adrenals/Urinary Tract: Adrenal glands are unremarkable. Kidneys are normal, without renal calculi, focal lesion, or hydronephrosis. Bladder is unremarkable. Stomach/Bowel: Stomach is within normal limits. Appendix is not seen. No evidence of bowel wall thickening, distention, or inflammatory changes. There is gaseous distention of small bowel with some scattered air-fluid levels. Vascular/Lymphatic: Aortic atherosclerosis. No  enlarged abdominal or pelvic lymph nodes. Reproductive: Prostate is unremarkable. Other: No abdominal wall hernia or abnormality. No abdominopelvic ascites. Musculoskeletal: Degenerative changes affect  the spine. IMPRESSION: 1. Gaseous distention of small bowel with scattered air-fluid levels. Findings can be seen in the setting of ileus or enteritis. No bowel obstruction. Aortic Atherosclerosis (ICD10-I70.0). Electronically Signed   By: Ronney Asters M.D.   On: 10/11/2022 19:25   DG Chest 2 View  Result Date: 10/11/2022 CLINICAL DATA:  Aspiration EXAM: CHEST - 2 VIEW COMPARISON:  Chest x-ray 10/02/2022 FINDINGS: The heart size and mediastinal contours are within normal limits. Both lungs are clear. There is dextroconvex scoliosis of the thoracic spine. Cervical spinal fusion plate is present. No acute fractures are seen. IMPRESSION: No active cardiopulmonary disease. Electronically Signed   By: Ronney Asters M.D.   On: 10/11/2022 17:12   CT HEAD WO CONTRAST  Result Date: 10/02/2022 CLINICAL DATA:  Headache EXAM: CT HEAD WITHOUT CONTRAST TECHNIQUE: Contiguous axial images were obtained from the base of the skull through the vertex without intravenous contrast. RADIATION DOSE REDUCTION: This exam was performed according to the departmental dose-optimization program which includes automated exposure control, adjustment of the mA and/or kV according to patient size and/or use of iterative reconstruction technique. COMPARISON:  CT Head 02/26/19 FINDINGS: Brain: No hemorrhage. No CT evidence of an acute infarct. No hydrocephalus. No extra-axial fluid collection. No midline shift. Vascular: No hyperdense vessel or unexpected calcification. Skull: Normal. Negative for fracture or focal lesion. Sinuses/Orbits: No mastoid or middle ear effusion. Foramina cerumen in the left EAC. Paranasal sinuses are clear. Orbits are unremarkable. Other: None. IMPRESSION: No specific etiology for headaches or altered mental status identified. Electronically Signed   By: Marin Roberts M.D.   On: 10/02/2022 11:02   DG Chest Port 1 View  Result Date: 10/02/2022 CLINICAL DATA:  Patient complains of pain in lungs after cleaning croup  came to his house. EXAM: PORTABLE CHEST 1 VIEW COMPARISON:  09/10/2022 FINDINGS: Stable cardiomediastinal contours. Heart size is normal. No pleural effusion or edema. No airspace opacities identified. Thoracic scoliosis with convexity towards the right. No acute osseous abnormality. Stable ACDF within the lower cervical spine IMPRESSION: 1. No acute cardiopulmonary abnormalities. 2. Thoracic scoliosis. Electronically Signed   By: Kerby Moors M.D.   On: 10/02/2022 10:11   US Abdomen Limited  Result Date: 09/30/2022 CLINICAL DATA:  Right upper quadrant pain for 2 days. EXAM: ULTRASOUND ABDOMEN LIMITED RIGHT UPPER QUADRANT COMPARISON:  09/26/2022. FINDINGS: Gallbladder: No stones are seen within the gallbladder. There is gallbladder wall thickening measuring 5 mm. No pericholecystic fluid. No sonographic Murphy sign noted by sonographer. Common bile duct: Diameter: 5 mm Liver: A small septated cyst is noted in the right lobe of the liver measuring 0.9 x 0.9 x 1.0 cm. Increased parenchymal echogenicity. Portal vein is patent on color Doppler imaging with normal direction of blood flow towards the liver. Other: No free fluid. IMPRESSION: 1. Gallbladder wall thickening with no evidence of cholelithiasis and no Murphy sign. Correlate clinically to exclude chronic cholecystitis versus local inflammatory changes. 2. Hepatic steatosis and cyst. Electronically Signed   By: Brett Fairy M.D.   On: 09/30/2022 02:39          No data to display          No results found for: "NITRICOXIDE"      Assessment & Plan:   Reactive airway disease Presumed reactive airway disease secondary to fiberglass exposure from malfunctioning HVAC system.  FeNO borderline today at 24 ppb. Educated him on the importance of compliance with inhaler therapy given his symptoms. He was agreeable to restarting. Rx resent to his pharmacy. Teachback performed. Likely has an upper airway component to his cough as well. Target  postnasal drainage with intranasal steroid. Advised to minimize throat clearing as much as possible. PFTs scheduled today. Action plan in place. Provided him with a letter for his landlord as well. He may need to contact the city for further intervention, which we discussed, or move, if possible.  Patient Instructions  Restart Advair 1 puff Twice daily. Brush tongue and rinse mouth afterwards. Use this consistently, regardless of symptoms Albuterol inhaler 2 puffs every 6 hours as needed for shortness of breath or wheezing. This is your rescue inhaler to use only as needed Flonase nasal spray 2 sprays each nostril daily for postnasal drainage  Pulmonary function testing ordered today  Reschedule appointment with GI  Attend appointment with cardiology  Follow up after pulmonary function testing with Dr. Ander Slade. If symptoms do not improve or worsen, please contact office for sooner follow up or seek emergency care.    Atypical chest pain Not active during OV. ACS workup during hospitalization unremarkable. Follow up with cardiology as scheduled. ED precautions advised.  Dysphagia He was supposed to have follow up with GI after one of his previous ED visits but he missed this and has yet to reschedule. Advised him to contact them for sooner follow up/evaluation.   I spent 45 minutes of dedicated to the care of this patient on the date of this encounter to include pre-visit review of records, face-to-face time with the patient discussing conditions above, post visit ordering of testing, clinical documentation with the electronic health record, making appropriate referrals as documented, and communicating necessary findings to members of the patients care team.  Clayton Bibles, NP 10/29/2022  Pt aware and understands NP's role.

## 2022-10-29 ENCOUNTER — Encounter: Payer: Self-pay | Admitting: Nurse Practitioner

## 2022-10-29 DIAGNOSIS — R1310 Dysphagia, unspecified: Secondary | ICD-10-CM | POA: Insufficient documentation

## 2022-10-29 DIAGNOSIS — J45909 Unspecified asthma, uncomplicated: Secondary | ICD-10-CM | POA: Insufficient documentation

## 2022-10-29 MED ORDER — FLUTICASONE PROPIONATE 50 MCG/ACT NA SUSP: 2.0000 | Freq: Every day | NASAL | 2 refills | Status: DC

## 2022-10-29 MED ORDER — FLUTICASONE-SALMETEROL 100-50 MCG/ACT IN AEPB: 1.0000 | INHALATION_SPRAY | Freq: Two times a day (BID) | RESPIRATORY_TRACT | 5 refills | Status: DC

## 2022-10-29 NOTE — Assessment & Plan Note (Signed)
Not active during OV. ACS workup during hospitalization unremarkable. Follow up with cardiology as scheduled. ED precautions advised.

## 2022-10-29 NOTE — Assessment & Plan Note (Signed)
He was supposed to have follow up with GI after one of his previous ED visits but he missed this and has yet to reschedule. Advised him to contact them for sooner follow up/evaluation.

## 2022-10-29 NOTE — Assessment & Plan Note (Addendum)
Presumed reactive airway disease secondary to fiberglass exposure from malfunctioning HVAC system. FeNO borderline today at 24 ppb. Educated him on the importance of compliance with inhaler therapy given his symptoms. He was agreeable to restarting. Rx resent to his pharmacy. Teachback performed. Likely has an upper airway component to his cough as well. Target postnasal drainage with intranasal steroid. Advised to minimize throat clearing as much as possible. PFTs scheduled today. Action plan in place. Provided him with a letter for his landlord as well. He may need to contact the city for further intervention, which we discussed, or move, if possible.  Patient Instructions  Restart Advair 1 puff Twice daily. Brush tongue and rinse mouth afterwards. Use this consistently, regardless of symptoms Albuterol inhaler 2 puffs every 6 hours as needed for shortness of breath or wheezing. This is your rescue inhaler to use only as needed Flonase nasal spray 2 sprays each nostril daily for postnasal drainage  Pulmonary function testing ordered today  Reschedule appointment with GI  Attend appointment with cardiology  Follow up after pulmonary function testing with Dr. Ander Slade. If symptoms do not improve or worsen, please contact office for sooner follow up or seek emergency care.

## 2022-10-30 ENCOUNTER — Emergency Department (HOSPITAL_COMMUNITY): Payer: Medicare Other

## 2022-10-30 ENCOUNTER — Encounter (HOSPITAL_COMMUNITY): Payer: Self-pay

## 2022-10-30 ENCOUNTER — Emergency Department (HOSPITAL_COMMUNITY)
Admission: EM | Admit: 2022-10-30 | Discharge: 2022-10-31 | Disposition: A | Discharge status: Home / Self Care | Payer: Medicare Other | Attending: Emergency Medicine | Admitting: Emergency Medicine

## 2022-10-30 ENCOUNTER — Other Ambulatory Visit: Payer: Self-pay

## 2022-10-30 DIAGNOSIS — R0789 Other chest pain: Secondary | ICD-10-CM | POA: Insufficient documentation

## 2022-10-30 DIAGNOSIS — M25561 Pain in right knee: Secondary | ICD-10-CM | POA: Insufficient documentation

## 2022-10-30 DIAGNOSIS — X509XXA Other and unspecified overexertion or strenuous movements or postures, initial encounter: Secondary | ICD-10-CM | POA: Insufficient documentation

## 2022-10-30 DIAGNOSIS — J45909 Unspecified asthma, uncomplicated: Secondary | ICD-10-CM | POA: Insufficient documentation

## 2022-10-30 DIAGNOSIS — R079 Chest pain, unspecified: Secondary | ICD-10-CM

## 2022-10-30 DIAGNOSIS — Z7951 Long term (current) use of inhaled steroids: Secondary | ICD-10-CM | POA: Insufficient documentation

## 2022-10-30 LAB — CBC
HCT: 39.8 % (ref 39.0–52.0)
Hemoglobin: 14.3 g/dL (ref 13.0–17.0)
MCH: 32.8 pg (ref 26.0–34.0)
MCHC: 35.9 g/dL (ref 30.0–36.0)
MCV: 91.3 fL (ref 80.0–100.0)
Platelets: 215 10*3/uL (ref 150–400)
RBC: 4.36 MIL/uL (ref 4.22–5.81)
RDW: 13.1 % (ref 11.5–15.5)
WBC: 7 10*3/uL (ref 4.0–10.5)
nRBC: 0 % (ref 0.0–0.2)

## 2022-10-30 LAB — BASIC METABOLIC PANEL
Anion gap: 9 (ref 5–15)
BUN: 14 mg/dL (ref 8–23)
CO2: 22 mmol/L (ref 22–32)
Calcium: 9.3 mg/dL (ref 8.9–10.3)
Chloride: 99 mmol/L (ref 98–111)
Creatinine, Ser: 0.69 mg/dL (ref 0.61–1.24)
GFR, Estimated: >60 mL/min (ref >60)
Glucose, Bld: 93 mg/dL (ref 70–99)
Potassium: 3.8 mmol/L (ref 3.5–5.1)
Sodium: 130 mmol/L — ABNORMAL LOW (ref 135–145)

## 2022-10-30 LAB — TROPONIN I (HIGH SENSITIVITY): Troponin I (High Sensitivity): 5 ng/L (ref <18)

## 2022-10-30 MED ORDER — SODIUM CHLORIDE 0.9 % IV BOLUS
1000.0000 mL | Freq: Once | INTRAVENOUS | Status: AC
  Administered 2022-10-30: 1000 mL via INTRAVENOUS

## 2022-10-30 MED ORDER — HYDROMORPHONE HCL 1 MG/ML IJ SOLN
1.0000 mg | Freq: Once | INTRAMUSCULAR | Status: AC
  Administered 2022-10-30: 1 mg via INTRAVENOUS
  Filled 2022-10-30: qty 1

## 2022-10-30 MED ORDER — MIDAZOLAM HCL 2 MG/2ML IJ SOLN
1.0000 mg | Freq: Once | INTRAMUSCULAR | Status: AC
  Administered 2022-10-31: 1 mg via INTRAVENOUS
  Filled 2022-10-30: qty 2

## 2022-10-30 MED ORDER — MORPHINE SULFATE (PF) 2 MG/ML IV SOLN: 2.0000 mg | Freq: Once | INTRAVENOUS | Status: DC

## 2022-10-30 MED ORDER — MORPHINE SULFATE (PF) 4 MG/ML IV SOLN
4.0000 mg | Freq: Once | INTRAVENOUS | Status: AC
  Administered 2022-10-30: 4 mg via INTRAVENOUS
  Filled 2022-10-30: qty 1

## 2022-10-30 MED ORDER — KETOROLAC TROMETHAMINE 15 MG/ML IJ SOLN
15.0000 mg | Freq: Once | INTRAMUSCULAR | Status: AC
  Administered 2022-10-30: 15 mg via INTRAVENOUS
  Filled 2022-10-30: qty 1

## 2022-10-30 MED ORDER — METHOCARBAMOL 500 MG PO TABS: 500.0000 mg | ORAL_TABLET | Freq: Two times a day (BID) | ORAL | 0 refills | Status: DC

## 2022-10-30 NOTE — ED Notes (Signed)
ED provider spoke to patient; patient in agreement to troponin blood draw.

## 2022-10-30 NOTE — ED Provider Notes (Incomplete)
[facility] Provider Note   CSN: [id number] Arrival date & time: 10/30/22  1827     History {Add pertinent medical, surgical, social history, OB history to HPI:1} Chief Complaint  Patient presents with  . Knee Pain    Brad [name] is a 67 y.o. male with past medical history significant for anxiety, depression, chest pain, reactive airway disease who presents with concern for right knee pain after twisting it in the car around a week ago.  Patient reports that he presented to orthopedics, had steroid injection, and x-ray which was nonspecific.  They were suspicious of possible cartilaginous injury, but could not perform an MRI at that time.  Patient reports that since then he has continued to walk on it and twisted it, thinks that is injured worse than it was previously.  He reports he took ibuprofen around an hour prior to arrival.  Patient reports that as he was dealing with the pain he began having chest pain that felt like pressure.  Patient reports it was worse with exertion, associate with shortness of breath.  He denies any nausea, vomiting, radiation to the arm, back.   Knee Pain      Home Medications Prior to Admission medications   Medication Sig Start Date End Date Taking? Authorizing Provider  albuterol (VENTOLIN HFA) 108 (90 Base) MCG/ACT inhaler Inhale 2 puffs into the lungs every 6 (six) hours as needed for wheezing or shortness of breath. 10/21/22   Raiford Noble Latif, DO  Alpha-Lipoic Acid 600 MG CAPS Take 600 mg by mouth daily.    [provider]  amLODipine (NORVASC) 5 MG tablet Take 2 tablets (10 mg total) by mouth daily. Patient taking differently: Take 5 mg by mouth daily. 03/02/19   Johnn Hai, MD  atorvastatin (LIPITOR) 10 MG tablet Take 10 mg by mouth 3 (three) times a week. MWF 08/10/19   [provider]  busPIRone (BUSPAR) 10 MG tablet Take 20 mg by mouth 3 (three) times daily.     [provider]  Cholecalciferol (VITAMIN D3) 125 MCG (5000 UT) TABS Take 5,000 Units by mouth daily.    [provider]  Coenzyme Q10 50 MG CAPS Take 50 mg by mouth daily.    [provider]  dicyclomine (BENTYL) 20 MG tablet Take 1 tablet (20 mg total) by mouth 3 (three) times daily as needed for spasms. 10/13/22   Long, Wonda Olds, MD  docusate sodium (COLACE) 100 MG capsule Take 1 capsule (100 mg total) by mouth every 12 (twelve) hours. 09/26/22   Valarie Merino, MD  fluticasone (FLONASE) 50 MCG/ACT nasal spray Place 2 sprays into both nostrils daily. 10/29/22   Cobb, Karie Schwalbe, NP  fluticasone-salmeterol (ADVAIR DISKUS) 100-50 MCG/ACT AEPB Inhale 1 puff into the lungs 2 (two) times daily. 10/29/22   Cobb, Karie Schwalbe, NP  LORazepam (ATIVAN) 1 MG tablet Take 1 mg by mouth 2 (two) times daily. 10/26/18   [provider]  losartan (COZAAR) 100 MG tablet Take 100 mg by mouth daily. 02/04/20   [provider]  mirtazapine (REMERON) 45 MG tablet Take 1 tablet (45 mg total) by mouth at bedtime. 10/21/22   Raiford Noble Latif, DO  Multiple Vitamin (MULTIVITAMIN) tablet Take 1 tablet by mouth daily.    [provider]  Omega-3 1000 MG CAPS Take 1,000 mg by mouth daily.    [provider]  polyethylene glycol (MIRALAX) 17 g packet Take 17  g by mouth 2 (two) times daily. 10/21/22   Raiford Noble Latif, DO  pregabalin (LYRICA) 75 MG capsule Take 75 mg by mouth 2 (two) times daily.    [provider]  senna-docusate (SENOKOT-S) 8.6-50 MG tablet Take 1 tablet by mouth at bedtime as needed for mild constipation. 10/13/22   Long, Wonda Olds, MD  traZODone (DESYREL) 50 MG tablet Take 50-100 mg by mouth at bedtime as needed for sleep.    [provider]  zaleplon (SONATA) 10 MG capsule Take 10-20 mg by mouth at bedtime as needed for sleep.    [provider]  zinc gluconate 50 MG tablet Take 50 mg by mouth daily.    [provider]       Allergies    Duloxetine hcl, Gabapentin, Trelegy ellipta [fluticasone-umeclidin-vilant], and Trileptal [oxcarbazepine]    Review of Systems   Review of Systems  Cardiovascular:  Positive for chest pain.  All other systems reviewed and are negative.   Physical Exam Updated Vital Signs BP (!) 154/95 (BP Location: Left Arm)   Pulse 82   Temp 99 F (37.2 C) (Oral)   Resp 18   SpO2 99%  Physical Exam Vitals and nursing note reviewed.  Constitutional:      General: He is not in acute distress.    Appearance: Normal appearance.  HENT:     Head: Normocephalic and atraumatic.  Eyes:     General:        Right eye: No discharge.        Left eye: No discharge.  Cardiovascular:     Rate and Rhythm: Normal rate and regular rhythm.     Heart sounds: No murmur heard.    No friction rub. No gallop.  Pulmonary:     Effort: Pulmonary effort is normal.     Breath sounds: Normal breath sounds.  Abdominal:     General: Bowel sounds are normal.     Palpations: Abdomen is soft.  Musculoskeletal:     Comments: Patient with some tenderness palpation along the right lateral knee with no significant effusion.  Intact range of motion to flexion, extension.  Patient can bear weight with some pain.  Skin:    General: Skin is warm and dry.     Capillary Refill: Capillary refill takes less than 2 seconds.  Neurological:     Mental Status: He is alert and oriented to person, place, and time.  Psychiatric:        Mood and Affect: Mood normal.        Behavior: Behavior normal.     ED Results / Procedures / Treatments   Labs (all labs ordered are listed, but only abnormal results are displayed) Labs Reviewed  BASIC METABOLIC PANEL  CBC  TROPONIN I (HIGH SENSITIVITY)    EKG None  Radiology No results found.  Procedures Procedures  {Document cardiac monitor, telemetry assessment procedure when appropriate:1}  Medications Ordered in ED Medications  ketorolac (TORADOL) 15 MG/ML  injection 15 mg (has no administration in time range)  morphine (PF) 4 MG/ML injection 4 mg (has no administration in time range)    ED Course/ Medical Decision Making/ A&P Clinical Course as of 10/30/22 2339  Sat Oct 30, 2022  2300 MRI seems to not be available tonight, after thorough evaluation of the leg I have low clinical suspicion for an acute infectious process, although the possibility of osteonecrosis could cause long-term health abnormalities I do not think that leg pain for  1 week is likely to represent acute osteonecrosis or require emergent MRI at this time, I think the patient is stable to follow-up for outpatient MRI and lieu of being transferred for emergent MRI no new neurologic deficits. [CP]    Clinical Course User Index [CP] Aylene Acoff H, PA-C   {   Click here for ABCD2, HEART and other calculatorsREFRESH Note before signing :1}                          Medical Decision Making Amount and/or Complexity of Data Reviewed Labs: ordered. Radiology: ordered.  Risk Prescription drug management.   This patient is a 67 y.o. male  who presents to the ED for concern of ongoing right knee pain after injury around 1 week ago, chest pain that began just prior to arrival.   Differential diagnoses prior to evaluation: The emergent differential diagnosis includes, but is not limited to, acute fracture, dislocation, ligamentous injury, infection of the joint, septic arthritis, ACS, AAS, PE, Mallory-Weiss, Boerhaave's, Pneumonia, acute bronchitis, asthma or COPD exacerbation, anxiety, MSK pain or traumatic injury to the chest, acid reflux versus other. This is not an exhaustive differential.   Past Medical History / Co-morbidities: Depression, anxiety, frequent emergency department visits, multiple somatic complaints  Additional history: Chart reviewed. Pertinent results include: Lab work, imaging from frequent emergency department visits.  Reviewed his recent echo and  admission for elevated troponin, chest pain.  Physical Exam: Physical exam performed. The pertinent findings include: On exam patient with some tenderness palpation of the lateral right knee without any step-off, deformity, normal motion of the joint throughout.  Patient has been somewhat hypertensive during his evaluation today, blood pressure 178/96 on most recent check.  He has no tachycardia, stable oxygen saturation on room air, stable respiratory rate.  He is very anxious on physical exam, is difficult to direct conversation or to give him any reassurance about multiple complaints, and frequent reassessments of the patient, he frequently called out for his nurse, asking similar questions over and over again.  He has normal understanding and can carry conversation but does not seem to be able to be reassured about his physical condition at this time.  No evidence of swelling bilateral pounds, no evidence of palpable cord, thrombosis, he has normal lung sounds on exam.  Lab Tests/Imaging studies: I personally interpreted labs/imaging and the pertinent results include: CBC unremarkable, troponin normal x 1, with delta troponin pending at this time, his BMP is notable for mild hyponatremia, sodium 130, will replete fluids.  Plain film chest x-ray, radiograph of the right knee independently interpreted by myself show questionable lesion of the right femur representing sclerosis, versus osteonecrosis, MRI recommended, nonemergently based on his clinical gestalt today, I agree with the radiologist interpretation.  Cardiac monitoring: EKG obtained and interpreted by my attending physician which shows: Normal sinus rhythm, left anterior fascicular block, abnormal R wave progression, no evidence of acute ischemia at this time   Medications: I ordered medication including Dilaudid, fluid bolus, Toradol for pain hyponatremia, Versed for anxiety.  I have reviewed the patients home medicines and have made  adjustments as needed.   12:04 AM Care of Brad [name] Senna transferred to [facility] and Dr. Marland Kitchen at the end of my shift as the patient will require reassessment once labs/imaging have resulted. Patient presentation, ED course, and plan of care discussed with review of all pertinent labs and imaging. Please see his/her note for further details  regarding further ED course and disposition. Plan at time of handoff is ***. This may be altered or completely changed at the discretion of the oncoming team pending results of further workup.  Final Clinical Impression(s) / ED Diagnoses Final diagnoses:  None    Rx / DC Orders ED Discharge Orders     None

## 2022-10-30 NOTE — Discharge Instructions (Addendum)
Please use Tylenol or ibuprofen for pain.  You may use 600 mg ibuprofen every 6 hours or 1000 mg of Tylenol every 6 hours.  You may choose to alternate between the 2.  This would be most effective.  Not to exceed 4 g of Tylenol within 24 hours.  Not to exceed 3200 mg ibuprofen 24 hours.  Follow up with your orthopedic specialist to discuss your imaging today.

## 2022-10-30 NOTE — ED Provider Notes (Signed)
[facility] Provider Note   CSN: [id number] Arrival date & time: 10/30/22  1827     History {Add pertinent medical, surgical, social history, OB history to HPI:1} Chief Complaint  Patient presents with   Knee Pain    Brad [name] is a 67 y.o. male with past medical history significant for anxiety, depression, chest pain, reactive airway disease who presents with concern for right knee pain after twisting it in the car around a week ago.  Patient reports that he presented to orthopedics, had steroid injection, and x-ray which was nonspecific.  They were suspicious of possible cartilaginous injury, but could not perform an MRI at that time.  Patient reports that since then he has continued to walk on it and twisted it, thinks that is injured worse than it was previously.  He reports he took ibuprofen around an hour prior to arrival.  Patient reports that as he was dealing with the pain he began having chest pain that felt like pressure.  Patient reports it was worse with exertion, associate with shortness of breath.  He denies any nausea, vomiting, radiation to the arm, back.   Knee Pain      Home Medications Prior to Admission medications   Medication Sig Start Date End Date Taking? Authorizing Provider  albuterol (VENTOLIN HFA) 108 (90 Base) MCG/ACT inhaler Inhale 2 puffs into the lungs every 6 (six) hours as needed for wheezing or shortness of breath. 10/21/22   Raiford Noble Latif, DO  Alpha-Lipoic Acid 600 MG CAPS Take 600 mg by mouth daily.    [provider]  amLODipine (NORVASC) 5 MG tablet Take 2 tablets (10 mg total) by mouth daily. Patient taking differently: Take 5 mg by mouth daily. 03/02/19   Johnn Hai, MD  atorvastatin (LIPITOR) 10 MG tablet Take 10 mg by mouth 3 (three) times a week. MWF 08/10/19   [provider]  busPIRone (BUSPAR) 10 MG tablet Take 20 mg by mouth 3 (three) times daily.     [provider]  Cholecalciferol (VITAMIN D3) 125 MCG (5000 UT) TABS Take 5,000 Units by mouth daily.    [provider]  Coenzyme Q10 50 MG CAPS Take 50 mg by mouth daily.    [provider]  dicyclomine (BENTYL) 20 MG tablet Take 1 tablet (20 mg total) by mouth 3 (three) times daily as needed for spasms. 10/13/22   Long, Wonda Olds, MD  docusate sodium (COLACE) 100 MG capsule Take 1 capsule (100 mg total) by mouth every 12 (twelve) hours. 09/26/22   Valarie Merino, MD  fluticasone (FLONASE) 50 MCG/ACT nasal spray Place 2 sprays into both nostrils daily. 10/29/22   Cobb, Karie Schwalbe, NP  fluticasone-salmeterol (ADVAIR DISKUS) 100-50 MCG/ACT AEPB Inhale 1 puff into the lungs 2 (two) times daily. 10/29/22   Cobb, Karie Schwalbe, NP  LORazepam (ATIVAN) 1 MG tablet Take 1 mg by mouth 2 (two) times daily. 10/26/18   [provider]  losartan (COZAAR) 100 MG tablet Take 100 mg by mouth daily. 02/04/20   [provider]  mirtazapine (REMERON) 45 MG tablet Take 1 tablet (45 mg total) by mouth at bedtime. 10/21/22   Raiford Noble Latif, DO  Multiple Vitamin (MULTIVITAMIN) tablet Take 1 tablet by mouth daily.    [provider]  Omega-3 1000 MG CAPS Take 1,000 mg by mouth daily.    [provider]  polyethylene glycol (MIRALAX) 17 g packet Take 17  g by mouth 2 (two) times daily. 10/21/22   Raiford Noble Latif, DO  pregabalin (LYRICA) 75 MG capsule Take 75 mg by mouth 2 (two) times daily.    [provider]  senna-docusate (SENOKOT-S) 8.6-50 MG tablet Take 1 tablet by mouth at bedtime as needed for mild constipation. 10/13/22   Long, Wonda Olds, MD  traZODone (DESYREL) 50 MG tablet Take 50-100 mg by mouth at bedtime as needed for sleep.    [provider]  zaleplon (SONATA) 10 MG capsule Take 10-20 mg by mouth at bedtime as needed for sleep.    [provider]  zinc gluconate 50 MG tablet Take 50 mg by mouth daily.    [provider]       Allergies    Duloxetine hcl, Gabapentin, Trelegy ellipta [fluticasone-umeclidin-vilant], and Trileptal [oxcarbazepine]    Review of Systems   Review of Systems  Cardiovascular:  Positive for chest pain.  All other systems reviewed and are negative.   Physical Exam Updated Vital Signs BP (!) 154/95 (BP Location: Left Arm)   Pulse 82   Temp 99 F (37.2 C) (Oral)   Resp 18   SpO2 99%  Physical Exam Vitals and nursing note reviewed.  Constitutional:      General: He is not in acute distress.    Appearance: Normal appearance.  HENT:     Head: Normocephalic and atraumatic.  Eyes:     General:        Right eye: No discharge.        Left eye: No discharge.  Cardiovascular:     Rate and Rhythm: Normal rate and regular rhythm.     Heart sounds: No murmur heard.    No friction rub. No gallop.  Pulmonary:     Effort: Pulmonary effort is normal.     Breath sounds: Normal breath sounds.  Abdominal:     General: Bowel sounds are normal.     Palpations: Abdomen is soft.  Musculoskeletal:     Comments: Patient with some tenderness palpation along the right lateral knee with no significant effusion.  Intact range of motion to flexion, extension.  Patient can bear weight with some pain.  Skin:    General: Skin is warm and dry.     Capillary Refill: Capillary refill takes less than 2 seconds.  Neurological:     Mental Status: He is alert and oriented to person, place, and time.  Psychiatric:        Mood and Affect: Mood normal.        Behavior: Behavior normal.     ED Results / Procedures / Treatments   Labs (all labs ordered are listed, but only abnormal results are displayed) Labs Reviewed  BASIC METABOLIC PANEL  CBC  TROPONIN I (HIGH SENSITIVITY)    EKG None  Radiology No results found.  Procedures Procedures  {Document cardiac monitor, telemetry assessment procedure when appropriate:1}  Medications Ordered in ED Medications  ketorolac (TORADOL) 15 MG/ML  injection 15 mg (has no administration in time range)  morphine (PF) 4 MG/ML injection 4 mg (has no administration in time range)    ED Course/ Medical Decision Making/ A&P   {   Click here for ABCD2, HEART and other calculatorsREFRESH Note before signing :1}                          Medical Decision Making Amount and/or Complexity of Data Reviewed Labs: ordered. Radiology:  ordered.  Risk Prescription drug management.   This patient is a 67 y.o. male  who presents to the ED for concern of ***.   Differential diagnoses prior to evaluation: The emergent differential diagnosis includes, but is not limited to,  *** . This is not an exhaustive differential.   Past Medical History / Co-morbidities: ***  Additional history: Chart reviewed. Pertinent results include: ***  Physical Exam: Physical exam performed. The pertinent findings include: ***  Lab Tests/Imaging studies: I personally interpreted labs/imaging and the pertinent results include:  ***. ***I agree with the radiologist interpretation.  Cardiac monitoring: EKG obtained and interpreted by my attending physician which shows: ***   Medications: I ordered medication including ***.  I have reviewed the patients home medicines and have made adjustments as needed.   Disposition: After consideration of the diagnostic results and the patients response to treatment, I feel that *** .   ***emergency department workup does not suggest an emergent condition requiring admission or immediate intervention beyond what has been performed at this time. The plan is: ***. The patient is safe for discharge and has been instructed to return immediately for worsening symptoms, change in symptoms or any other concerns.  Final Clinical Impression(s) / ED Diagnoses Final diagnoses:  None    Rx / DC Orders ED Discharge Orders     None

## 2022-10-30 NOTE — ED Notes (Signed)
Patient refused to let this RN draw his second troponin. I educated the patient on the importance of the lab draw, but patient still refused and requested to speak to the ED provider. Notified Christian, PA-C.

## 2022-10-30 NOTE — ED Notes (Signed)
Pt refused IV after writer asked and explained why pt needed IV multiple times

## 2022-10-30 NOTE — ED Triage Notes (Signed)
Pt arrived via EMS, c/o knee pain. When provider went to assess pt, pt states he had chest pain worsening while trying to walk on knee.

## 2022-10-30 NOTE — ED Provider Notes (Signed)
67 year old male c/o knee pain x 1 week ago. Seen by ortho, had XR and injection, not doing MRI that day. Anterior knee pain. XR today with sclerotic lesion on femur, plan for OP MRI. Provide with knee brace and crutches.  Also c/o chest pain onset today just PTA. Recent obs after elevated trop. Echo on that admission.   Pending 2nd trop Physical Exam  BP (!) 154/95 (BP Location: Left Arm)   Pulse 82   Temp 99 F (37.2 C) (Oral)   Resp 18   SpO2 99%   Physical Exam  Procedures  Procedures  ED Course / MDM   Clinical Course as of 10/30/22 2348  Sat Oct 30, 2022  2300 MRI seems to not be available tonight, after thorough evaluation of the leg I have low clinical suspicion for an acute infectious process, although the possibility of osteonecrosis could cause long-term health abnormalities I do not think that leg pain for 1 week is likely to represent acute osteonecrosis or require emergent MRI at this time, I think the patient is stable to follow-up for outpatient MRI and lieu of being transferred for emergent MRI no new neurologic deficits. [CP]    Clinical Course User Index [CP] Olene Floss, PA-C   Medical Decision Making Amount and/or Complexity of Data Reviewed Labs: ordered. Radiology: ordered.  Risk Prescription drug management.   Repeat troponin unchanged.  Patient discharged with plan to follow-up with his orthopedic specialist.       Jeannie Fend, PA-C 10/31/22 0547    Nira Conn, MD 10/31/22 [phone]

## 2022-10-31 DIAGNOSIS — R0789 Other chest pain: Secondary | ICD-10-CM | POA: Diagnosis not present

## 2022-10-31 LAB — TROPONIN I (HIGH SENSITIVITY): Troponin I (High Sensitivity): 5 ng/L (ref <18)

## 2022-11-01 ENCOUNTER — Telehealth: Payer: Self-pay

## 2022-11-01 NOTE — Telephone Encounter (Signed)
PA request received via CMM for Fluticasone-Salmeterol 100-50MCG/ACT aerosol powder  PA has been submitted to [facility] and is pending determination.   Key: [id number]

## 2022-11-02 ENCOUNTER — Other Ambulatory Visit: Payer: Self-pay

## 2022-11-02 ENCOUNTER — Encounter (HOSPITAL_COMMUNITY): Payer: Self-pay | Admitting: Emergency Medicine

## 2022-11-02 ENCOUNTER — Encounter (HOSPITAL_COMMUNITY): Payer: Self-pay

## 2022-11-02 ENCOUNTER — Emergency Department (HOSPITAL_COMMUNITY)
Admission: EM | Admit: 2022-11-02 | Discharge: 2022-11-02 | Disposition: A | Discharge status: Home / Self Care | Payer: Medicare Other | Source: Home / Self Care | Attending: Emergency Medicine | Admitting: Emergency Medicine

## 2022-11-02 ENCOUNTER — Emergency Department (HOSPITAL_COMMUNITY)
Admission: EM | Admit: 2022-11-02 | Discharge: 2022-11-02 | Disposition: A | Discharge status: Home / Self Care | Payer: Medicare Other | Source: Home / Self Care

## 2022-11-02 ENCOUNTER — Emergency Department (HOSPITAL_COMMUNITY)
Admission: EM | Admit: 2022-11-02 | Discharge: 2022-11-02 | Disposition: A | Discharge status: Home / Self Care | Payer: Medicare Other | Attending: Emergency Medicine | Admitting: Emergency Medicine

## 2022-11-02 DIAGNOSIS — Z765 Malingerer [conscious simulation]: Secondary | ICD-10-CM

## 2022-11-02 DIAGNOSIS — Z79899 Other long term (current) drug therapy: Secondary | ICD-10-CM | POA: Diagnosis not present

## 2022-11-02 DIAGNOSIS — M25561 Pain in right knee: Secondary | ICD-10-CM | POA: Insufficient documentation

## 2022-11-02 DIAGNOSIS — G8929 Other chronic pain: Secondary | ICD-10-CM | POA: Diagnosis not present

## 2022-11-02 DIAGNOSIS — F419 Anxiety disorder, unspecified: Secondary | ICD-10-CM | POA: Insufficient documentation

## 2022-11-02 DIAGNOSIS — R0602 Shortness of breath: Secondary | ICD-10-CM | POA: Insufficient documentation

## 2022-11-02 HISTORY — DX: Chest pain, unspecified: R07.9

## 2022-11-02 MED ORDER — ACETAMINOPHEN 325 MG PO TABS
650.0000 mg | ORAL_TABLET | Freq: Once | ORAL | Status: AC
  Administered 2022-11-02: 650 mg via ORAL
  Filled 2022-11-02: qty 2

## 2022-11-02 MED ORDER — IBUPROFEN 800 MG PO TABS
800.0000 mg | ORAL_TABLET | Freq: Once | ORAL | Status: AC
  Administered 2022-11-02: 800 mg via ORAL
  Filled 2022-11-02: qty 1

## 2022-11-02 NOTE — ED Triage Notes (Signed)
Pt bib EMS for right knee pain. Seen earlier today for same complaint. Pt is bending knee and stood up off the ems stretcher and walked to bed. Pt states "body is broken up".

## 2022-11-02 NOTE — Discharge Instructions (Addendum)
Call the numbers listed for primary care follow-up.

## 2022-11-02 NOTE — ED Provider Notes (Signed)
[place] EMERGENCY DEPARTMENT AT [facility] Provider Note   CSN: [id number] Arrival date & time: 11/02/22  2136     History No chief complaint on file.   Brad [name] is a 67 y.o. male presenting today due to pain in his body.  He was discharged 15 minutes ago and received ibuprofen.  Suspect this is malingering.  Continues to request MRI of his knee which is not indicated at this time.  HPI     Home Medications Prior to Admission medications   Medication Sig Start Date End Date Taking? Authorizing Provider  albuterol (VENTOLIN HFA) 108 (90 Base) MCG/ACT inhaler Inhale 2 puffs into the lungs every 6 (six) hours as needed for wheezing or shortness of breath. 10/21/22   Raiford Noble Latif, DO  Alpha-Lipoic Acid 600 MG CAPS Take 600 mg by mouth daily.    [provider]  amLODipine (NORVASC) 5 MG tablet Take 2 tablets (10 mg total) by mouth daily. Patient taking differently: Take 5 mg by mouth daily. 03/02/19   Johnn Hai, MD  atorvastatin (LIPITOR) 10 MG tablet Take 10 mg by mouth 3 (three) times a week. MWF 08/10/19   [provider]  busPIRone (BUSPAR) 10 MG tablet Take 20 mg by mouth 3 (three) times daily.     [provider]  Cholecalciferol (VITAMIN D3) 125 MCG (5000 UT) TABS Take 5,000 Units by mouth daily.    [provider]  Coenzyme Q10 50 MG CAPS Take 50 mg by mouth daily.    [provider]  dicyclomine (BENTYL) 20 MG tablet Take 1 tablet (20 mg total) by mouth 3 (three) times daily as needed for spasms. 10/13/22   Long, Wonda Olds, MD  docusate sodium (COLACE) 100 MG capsule Take 1 capsule (100 mg total) by mouth every 12 (twelve) hours. 09/26/22   Valarie Merino, MD  fluticasone (FLONASE) 50 MCG/ACT nasal spray Place 2 sprays into both nostrils daily. 10/29/22   Cobb, Karie Schwalbe, NP  fluticasone-salmeterol (ADVAIR DISKUS) 100-50 MCG/ACT AEPB Inhale 1 puff into the lungs 2 (two) times daily. 10/29/22   Cobb, Karie Schwalbe, NP   LORazepam (ATIVAN) 1 MG tablet Take 1 mg by mouth 2 (two) times daily. 10/26/18   [provider]  losartan (COZAAR) 100 MG tablet Take 100 mg by mouth daily. 02/04/20   [provider]  methocarbamol (ROBAXIN) 500 MG tablet Take 1 tablet (500 mg total) by mouth 2 (two) times daily. 10/30/22   Prosperi, Christian H, PA-C  mirtazapine (REMERON) 45 MG tablet Take 1 tablet (45 mg total) by mouth at bedtime. 10/21/22   Raiford Noble Latif, DO  Multiple Vitamin (MULTIVITAMIN) tablet Take 1 tablet by mouth daily.    [provider]  Omega-3 1000 MG CAPS Take 1,000 mg by mouth daily.    [provider]  polyethylene glycol (MIRALAX) 17 g packet Take 17 g by mouth 2 (two) times daily. 10/21/22   Raiford Noble Latif, DO  pregabalin (LYRICA) 75 MG capsule Take 75 mg by mouth 2 (two) times daily.    [provider]  senna-docusate (SENOKOT-S) 8.6-50 MG tablet Take 1 tablet by mouth at bedtime as needed for mild constipation. 10/13/22   Long, Wonda Olds, MD  traZODone (DESYREL) 50 MG tablet Take 50-100 mg by mouth at bedtime as needed for sleep.    [provider]  zaleplon (SONATA) 10 MG capsule Take 10-20 mg by mouth at bedtime as needed for sleep.  [provider]  zinc gluconate 50 MG tablet Take 50 mg by mouth daily.    [provider]      Allergies    Duloxetine hcl, Gabapentin, Trelegy ellipta [fluticasone-umeclidin-vilant], and Trileptal [oxcarbazepine]    Review of Systems   Review of Systems  Physical Exam Updated Vital Signs There were no vitals taken for this visit. Physical Exam Vitals and nursing note reviewed.  Constitutional:      Appearance: Normal appearance.  HENT:     Head: Normocephalic and atraumatic.  Eyes:     General: No scleral icterus.    Conjunctiva/sclera: Conjunctivae normal.  Pulmonary:     Effort: Pulmonary effort is normal. No respiratory distress.  Skin:    Findings: No rash.  Neurological:      Mental Status: He is alert.  Psychiatric:        Mood and Affect: Mood normal.     ED Results / Procedures / Treatments   Labs (all labs ordered are listed, but only abnormal results are displayed) Labs Reviewed - No data to display  EKG None  Radiology No results found.  Procedures Procedures   Medications Ordered in ED Medications - No data to display  ED Course/ Medical Decision Making/ A&P                             Medical Decision Making  Patient returned and checked back in after being discharged by me 10 minutes ago.  This will be his third visit today.  He will be discharged again and escorted off the property by security.  He is ambulatory with no signs of neurovascular compromise. Final Clinical Impression(s) / ED Diagnoses Final diagnoses:  Malingering    Rx / DC Orders ED Discharge Orders     None      Results and diagnoses were explained to the patient. Return precautions discussed in full. Patient had no additional questions and expressed complete understanding.   This chart was dictated using voice recognition software.  Despite best efforts to proofread,  errors can occur which can change the documentation meaning.    Darliss Ridgel 11/02/22 2140    Hayden Rasmussen, MD 11/03/22 [phone]

## 2022-11-02 NOTE — ED Notes (Signed)
Pt walking around room then gets on bed and pulls knees up to chest. Complaining in pain. Thoroughly discussed following up with primary care provider

## 2022-11-02 NOTE — ED Triage Notes (Signed)
Pt malingering

## 2022-11-02 NOTE — Telephone Encounter (Signed)
PA has been DENIED due to:   This drug is not covered on the formulary. We are denying your request because we do not show that you  have tried at least 2 covered drugs that can treat your condition. Covered drug(s) is/are: advair hfa inhalation  aerosol (45-21 mcg/act, 115-21 mcg/act, 230-21 mcg/act), breo ellipta inhalation aerosol powder breath  activated (50-74mg/inh), breo ellipta inhalation aerosol powder breath activated 200-25 mcg/inh, breo ellipta  inhalation aerosol powder breath activated 100-25 mcg/inh, dulera aerosol (50-5, 100-5, and 200-5 mcg/act)  inhalation.

## 2022-11-02 NOTE — ED Provider Notes (Signed)
[facility] Provider Note   CSN: [id number] Arrival date & time: 11/02/22  1253     History  Chief Complaint  Patient presents with   Knee Pain   Shortness of Breath    Brad [name] is a 67 y.o. male.  Patient with history of anxiety, chronic shortness of breath, frequent emergency department visits --presents today for reevaluation of his right knee as well as shortness of breath.  Patient was seen in the emergency department most recently on 10/30/2022.  X-ray done at that time was negative for fracture.  Patient has followed up as an outpatient in regards to this and this is a chronic problem.  No new injuries.  He had an evaluation for PE about 2 weeks ago.  He was admitted for observation due to elevated troponin.  Patient continues to have shortness of breath.  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  No fevers or cough listed.  Review of database shows that patient is prescribed lorazepam and sleep medication.       Home Medications Prior to Admission medications   Medication Sig Start Date End Date Taking? Authorizing Provider  albuterol (VENTOLIN HFA) 108 (90 Base) MCG/ACT inhaler Inhale 2 puffs into the lungs every 6 (six) hours as needed for wheezing or shortness of breath. 10/21/22   Raiford Noble Latif, DO  Alpha-Lipoic Acid 600 MG CAPS Take 600 mg by mouth daily.    [provider]  amLODipine (NORVASC) 5 MG tablet Take 2 tablets (10 mg total) by mouth daily. Patient taking differently: Take 5 mg by mouth daily. 03/02/19   Johnn Hai, MD  atorvastatin (LIPITOR) 10 MG tablet Take 10 mg by mouth 3 (three) times a week. MWF 08/10/19   [provider]  busPIRone (BUSPAR) 10 MG tablet Take 20 mg by mouth 3 (three) times daily.     [provider]  Cholecalciferol (VITAMIN D3) 125 MCG (5000 UT) TABS Take 5,000 Units by mouth daily.    [provider]  Coenzyme Q10 50 MG CAPS Take 50 mg by mouth daily.    [provider]  dicyclomine (BENTYL) 20 MG tablet Take 1 tablet (20 mg total) by mouth 3 (three) times daily as needed for spasms. 10/13/22   Long, Wonda Olds, MD  docusate sodium (COLACE) 100 MG capsule Take 1 capsule (100 mg total) by mouth every 12 (twelve) hours. 09/26/22   Valarie Merino, MD  fluticasone (FLONASE) 50 MCG/ACT nasal spray Place 2 sprays into both nostrils daily. 10/29/22   Cobb, Karie Schwalbe, NP  fluticasone-salmeterol (ADVAIR DISKUS) 100-50 MCG/ACT AEPB Inhale 1 puff into the lungs 2 (two) times daily. 10/29/22   Cobb, Karie Schwalbe, NP  LORazepam (ATIVAN) 1 MG tablet Take 1 mg by mouth 2 (two) times daily. 10/26/18   [provider]  losartan (COZAAR) 100 MG tablet Take 100 mg by mouth daily. 02/04/20   [provider]  methocarbamol (ROBAXIN) 500 MG tablet Take 1 tablet (500 mg total) by mouth 2 (two) times daily. 10/30/22   Prosperi, Christian H, PA-C  mirtazapine (REMERON) 45 MG tablet Take 1 tablet (45 mg total) by mouth at bedtime. 10/21/22   Raiford Noble Latif, DO  Multiple Vitamin (MULTIVITAMIN) tablet Take 1 tablet by mouth daily.    [provider]  Omega-3 1000 MG CAPS Take 1,000 mg by mouth daily.    [provider]  polyethylene glycol (MIRALAX) 17 g packet Take 17 g by mouth 2 (two) times daily. 10/21/22   Raiford Noble Latif, DO  pregabalin (LYRICA) 75 MG capsule Take 75 mg by mouth 2 (two) times daily.    [provider]  senna-docusate (SENOKOT-S) 8.6-50 MG tablet Take 1 tablet by mouth at bedtime as needed for mild constipation. 10/13/22   Long, Wonda Olds, MD  traZODone (DESYREL) 50 MG tablet Take 50-100 mg by mouth at bedtime as needed for sleep.    [provider]  zaleplon (SONATA) 10 MG capsule Take 10-20 mg by mouth at bedtime as needed for sleep.    [provider]  zinc gluconate 50 MG tablet Take 50 mg by mouth daily.     [provider]      Allergies    Duloxetine hcl, Gabapentin, Trelegy ellipta [fluticasone-umeclidin-vilant], and Trileptal [oxcarbazepine]    Review of Systems   Review of Systems  Physical Exam Updated Vital Signs BP (!) 156/103   Pulse 99   Temp 98.3 F (36.8 C) (Oral)   Resp 18   Ht '5\' 9"'$  (1.753 m)   Wt 59 kg   SpO2 99%   BMI 19.20 kg/m  Physical Exam Vitals and nursing note reviewed.  Constitutional:      Appearance: He is well-developed. He is not diaphoretic.  HENT:     Head: Normocephalic and atraumatic.     Mouth/Throat:     Mouth: Mucous membranes are not dry.  Eyes:     Conjunctiva/sclera: Conjunctivae normal.  Neck:     Vascular: Normal carotid pulses. No carotid bruit or JVD.     Trachea: Trachea normal. No tracheal deviation.  Cardiovascular:     Rate and Rhythm: Normal rate and regular rhythm.     Pulses: No decreased pulses.          Radial pulses are 2+ on the right side and 2+ on the left side.     Heart sounds: Normal heart sounds, S1 normal and S2 normal. Heart sounds not distant. No murmur heard.    Comments: 2+ DP pulse left lower extremity.   Pulmonary:     Effort: Pulmonary effort is normal. No respiratory distress.     Breath sounds: Normal breath sounds. No wheezing, rhonchi or rales.     Comments: Patient reports shortness of breath however lungs are clear and effort is normal. Chest:     Chest wall: No tenderness.  Abdominal:     General: Bowel sounds are normal.     Palpations: Abdomen is soft.     Tenderness: There is no abdominal tenderness. There is no guarding or rebound.  Musculoskeletal:     Cervical back: Normal range of motion and neck supple. No muscular tenderness.     Right lower leg: No edema.     Left lower leg: No edema.     Comments: Patient winces in pain with palpation anywhere on the right lower leg to right knee.  He is weightbearing without any difficulty in the room prior to sitting down.  He appears to  have normal range of motion while walking.  Skin:    General: Skin is warm and dry.     Coloration: Skin is not pale.  Neurological:     Mental Status: He is alert. Mental status is at baseline.     Comments: Distal lower left extremity with circulation, motor and sensation intact.  Psychiatric:  Mood and Affect: Mood is anxious.     ED Results / Procedures / Treatments   Labs (all labs ordered are listed, but only abnormal results are displayed) Labs Reviewed - No data to display  EKG None  Radiology No results found.  Procedures Procedures    Medications Ordered in ED Medications  acetaminophen (TYLENOL) tablet 650 mg (has no administration in time range)    ED Course/ Medical Decision Making/ A&P    Patient seen and examined. History obtained directly from patient.  Reviewed previous ED notes and recent hospitalization notes.  Reviewed CT imaging of which patient has had more than 10 CT studies over the past 3 months.  Labs/EKG: None ordered  Imaging: None ordered  Medications/Fluids: None ordered  Most recent vital signs reviewed and are as follows: BP (!) 156/103   Pulse 99   Temp 98.3 F (36.8 C) (Oral)   Resp 18   Ht '5\' 9"'$  (1.753 m)   Wt 59 kg   SpO2 99%   BMI 19.20 kg/m   Initial impression: Chronic knee pain, chronic shortness of breath  Patient offered Ace wrap.  Also Tylenol was ordered for pain.  Patient is requesting "something stronger".  I discussed that I do not feel that narcotic pain medications are indicated given his current symptoms and exam.  Patient also requesting an MRI of his knee today.  Given that he is weightbearing with normal range of motion, no effusion, no concern for septic arthritis --no indication for emergent MRI today.  Encouraged him to continue to follow-up with his orthopedist.  Home treatment plan: Continued outpatient follow-up, PCP referral.  Return instructions discussed with patient: New or worsening  symptoms  Follow-up instructions discussed with patient: PCP follow-up.                             Medical Decision Making Risk OTC drugs.   Patient with chronic shortness of breath.  He has had extensive workup recently including echocardiogram.  No evidence of heart failure or other significant structural issue with the heart.  I low concern for ACS, PE, pneumonia, pneumothorax today.  This is a chronic problem for the patient which appears to be at his baseline.  Signs are normal without tachypnea, hypoxia, tachycardia.  Patient also reports chronic knee pain.  He has had x-ray without acute findings.  At this time, feel that outpatient workup is very warranted.  No emergent conditions identified which would require emergent specialist follow-up or advanced imaging today.  I do not suspect DVT based on his presentation.        Final Clinical Impression(s) / ED Diagnoses Final diagnoses:  Anxiety  Acute pain of right knee  Chronic shortness of breath    Rx / DC Orders ED Discharge Orders     None         Carlisle Cater, PA-C 11/02/22 1351    Fransico Meadow, MD 11/03/22 1005

## 2022-11-02 NOTE — ED Triage Notes (Signed)
Patient arrives in wheelchair by POV c/o right knee pain since twisting after getting out of the car. Patient also states he can't breath due to the fiberglass in his lungs. Patient anxious in triage.

## 2022-11-02 NOTE — ED Notes (Signed)
Pt wheeled to waiting area to contact a ride home. Pt requesting MRI of knee and advised to follow up with PCP. Pt also reassured that vital signs are in normal range.

## 2022-11-02 NOTE — ED Provider Notes (Signed)
[facility] Provider Note   CSN: [id number] Arrival date & time: 11/02/22  2059     History  Chief Complaint  Patient presents with   Knee Pain    Brad [name] is a 67 y.o. male presenting today with right knee pain.  Requesting MRI.  It was seen earlier today.  Says that the pain is worse and he needs something stronger than Tylenol or ibuprofen.   Knee Pain      Home Medications Prior to Admission medications   Medication Sig Start Date End Date Taking? Authorizing Provider  albuterol (VENTOLIN HFA) 108 (90 Base) MCG/ACT inhaler Inhale 2 puffs into the lungs every 6 (six) hours as needed for wheezing or shortness of breath. 10/21/22   Raiford Noble Latif, DO  Alpha-Lipoic Acid 600 MG CAPS Take 600 mg by mouth daily.    [provider]  amLODipine (NORVASC) 5 MG tablet Take 2 tablets (10 mg total) by mouth daily. Patient taking differently: Take 5 mg by mouth daily. 03/02/19   Johnn Hai, MD  atorvastatin (LIPITOR) 10 MG tablet Take 10 mg by mouth 3 (three) times a week. MWF 08/10/19   [provider]  busPIRone (BUSPAR) 10 MG tablet Take 20 mg by mouth 3 (three) times daily.     [provider]  Cholecalciferol (VITAMIN D3) 125 MCG (5000 UT) TABS Take 5,000 Units by mouth daily.    [provider]  Coenzyme Q10 50 MG CAPS Take 50 mg by mouth daily.    [provider]  dicyclomine (BENTYL) 20 MG tablet Take 1 tablet (20 mg total) by mouth 3 (three) times daily as needed for spasms. 10/13/22   Long, Wonda Olds, MD  docusate sodium (COLACE) 100 MG capsule Take 1 capsule (100 mg total) by mouth every 12 (twelve) hours. 09/26/22   Valarie Merino, MD  fluticasone (FLONASE) 50 MCG/ACT nasal spray Place 2 sprays into both nostrils daily. 10/29/22   Cobb, Karie Schwalbe, NP  fluticasone-salmeterol (ADVAIR DISKUS) 100-50 MCG/ACT AEPB Inhale 1 puff into the lungs 2 (two) times daily. 10/29/22   Cobb, Karie Schwalbe, NP  LORazepam (ATIVAN) 1 MG tablet Take 1 mg by mouth 2 (two) times daily. 10/26/18   [provider]  losartan (COZAAR) 100 MG tablet Take 100 mg by mouth daily. 02/04/20   [provider]  methocarbamol (ROBAXIN) 500 MG tablet Take 1 tablet (500 mg total) by mouth 2 (two) times daily. 10/30/22   Prosperi, Christian H, PA-C  mirtazapine (REMERON) 45 MG tablet Take 1 tablet (45 mg total) by mouth at bedtime. 10/21/22   Raiford Noble Latif, DO  Multiple Vitamin (MULTIVITAMIN) tablet Take 1 tablet by mouth daily.    [provider]  Omega-3 1000 MG CAPS Take 1,000 mg by mouth daily.    [provider]  polyethylene glycol (MIRALAX) 17 g packet Take 17 g by mouth 2 (two) times daily. 10/21/22   Raiford Noble Latif, DO  pregabalin (LYRICA) 75 MG capsule Take 75 mg by mouth 2 (two) times daily.    [provider]  senna-docusate (SENOKOT-S) 8.6-50 MG tablet Take 1 tablet by mouth at bedtime as needed for mild constipation. 10/13/22   Long, Wonda Olds, MD  traZODone (DESYREL) 50 MG tablet Take 50-100 mg by mouth at bedtime as needed for sleep.    [provider]  zaleplon (SONATA) 10 MG capsule Take 10-20 mg by mouth at bedtime as needed  for sleep.    [provider]  zinc gluconate 50 MG tablet Take 50 mg by mouth daily.    [provider]      Allergies    Duloxetine hcl, Gabapentin, Trelegy ellipta [fluticasone-umeclidin-vilant], and Trileptal [oxcarbazepine]    Review of Systems   Review of Systems  Physical Exam Updated Vital Signs There were no vitals taken for this visit. Physical Exam Vitals and nursing note reviewed.  Constitutional:      Appearance: Normal appearance.  HENT:     Head: Normocephalic and atraumatic.  Eyes:     General: No scleral icterus.    Conjunctiva/sclera: Conjunctivae normal.  Pulmonary:     Effort: Pulmonary effort is normal. No respiratory distress.  Musculoskeletal:     Comments:  Ambulatory  Skin:    Findings: No rash.     Comments: Neurovascularly intact  Neurological:     Mental Status: He is alert.  Psychiatric:     Comments: Very anxious     ED Results / Procedures / Treatments   Labs (all labs ordered are listed, but only abnormal results are displayed) Labs Reviewed - No data to display  EKG None  Radiology No results found.  Procedures Procedures   Medications Ordered in ED Medications  ibuprofen (ADVIL) tablet 800 mg (has no administration in time range)    ED Course/ Medical Decision Making/ A&P                             Medical Decision Making Risk Prescription drug management.   67 year old male presenting today with knee pain.  He was seen for the same earlier today as well as 2 days ago.  Patient has over 10 ED visits in the past month.  Imaging 2 days ago was negative.  No recent falls, do not believe he needs to be reimaged.  He was able to get off of the EMS stretcher and ambulate to the bed.  We discussed that he does not need an emergent MRI and he needs to follow-up outpatient.  There is no warmth or erythema of the joint.  Low suspicion septic joint.  No risk factors for osteonecrosis she does.  And do not believe this is something we need to be worried about at this time her he was offered Tylenol and ibuprofen.  Took an ibuprofen and discharged   Final Clinical Impression(s) / ED Diagnoses Final diagnoses:  Chronic pain of right knee    Rx / DC Orders ED Discharge Orders     None      Results and diagnoses were explained to the patient. Return precautions discussed in full. Patient had no additional questions and expressed complete understanding.   This chart was dictated using voice recognition software.  Despite best efforts to proofread,  errors can occur which can change the documentation meaning.    Darliss Ridgel 11/02/22 2116    Hayden Rasmussen, MD 11/03/22 [phone]

## 2022-11-02 NOTE — Discharge Instructions (Signed)
Please follow-up outpatient use Tylenol and ibuprofen for your pain.

## 2022-11-02 NOTE — ED Notes (Signed)
Escorted out by security and [employer]

## 2022-11-02 NOTE — Discharge Instructions (Signed)
You have already been evaluated for your knee several times.  Most recently today.  Please use Tylenol and ibuprofen and follow-up with your primary care doctor with any further concerns.

## 2022-11-02 NOTE — ED Notes (Signed)
Dc instructions reviewed with pt thoroughly. Pt wheeled to lobby to call a cab for ride home.

## 2022-11-03 ENCOUNTER — Ambulatory Visit: Payer: Medicare Other | Admitting: Cardiology

## 2022-11-03 MED ORDER — FLUTICASONE-SALMETEROL 115-21 MCG/ACT IN AERO: 2.0000 | INHALATION_SPRAY | Freq: Two times a day (BID) | RESPIRATORY_TRACT | 12 refills | Status: DC

## 2022-11-03 NOTE — Telephone Encounter (Signed)
Please send order for Advair HFA 115 mcg 2 puffs Twice daily. Brush tongue and rinse mouth afterwards. Thanks.

## 2022-11-03 NOTE — Telephone Encounter (Signed)
Routing to [facility] for review.

## 2022-11-03 NOTE — Telephone Encounter (Signed)
Called and spoke with pt about the PA that was done for Advair Diskus. Pt said he was going to check with the pharmacy to see how much it would've cost for the Advair Diskus as he has savings cards that he uses. Pt wanted to go ahead and have the Advair HFA sent to pharmacy as well since it is on formulary in case he is not able to get the Advair Diskus with savings cards. Nothing further needed.

## 2022-11-03 NOTE — Addendum Note (Signed)
Addended by: Lorretta Harp on: 11/03/2022 11:37 AM   Modules accepted: Orders

## 2022-11-04 NOTE — Progress Notes (Signed)
Chief Complaint  Patient presents with   Follow-up    Chest pain   History of Present Illness: 67 yo male with history of anxiety, depression, GERD, HTN, mitral valve prolapse who is here today for hospital follow up and is added onto my schedule. He was recently evaluated by Dr. Johnsie Cancel in February 2024. He was seen in the ED over 10 times in February 2024 with c/o diffuse body aches including chest pains. He was seen on 10/20/22 by Dr. Johnsie Cancel. CTA negative for PE. No evidence of coronary calcification. Echo 10/20/22 with LVEF=60-65%. No valve disease. His chest pain was felt to be atypical and he was discharged home with no plans for cardiac follow up per our consult team. He was seen in the ED 3 times on 11/02/22 and per notes of the ED staff he was c/o knee pain and felt to be drug seeking.   He tells me today that he has issues in his rental house. He believes that there are plastic micro particles from the HVAC system. He has dyspnea but no LE edema. He has some chest tightness when he takes in a deep breath. He has no exertional pain. He is very concerned that his environmental exposure at home is his issue.   Primary Care Physician: Patient, No Pcp Per  Primary cardiologist: Dr. Johnsie Cancel  Past Medical History:  Diagnosis Date   Allergy    Anxiety    Chest pain    Depression    DJD (degenerative joint disease), cervical    postiton with pillow under knees, cant turn neck    Dysentery, amebic, acute 08/31/1979   GERD (gastroesophageal reflux disease)    H/O bronchitis    H/O malaria 08/30/1982   Hearing loss    bilateral    Hypertension    labile Blood pressure   Inguinal hernia    Insomnia    early morning awakening   MVP (mitral valve prolapse)    "no problems"   Perianal pain    Personal history of colonic polyps    Tinnitus    right ear    Past Surgical History:  Procedure Laterality Date   CARPAL TUNNEL RELEASE  10/06, 5/10   right wrist    CARPAL TUNNEL RELEASE   3/10   left wrist    cervical spine discectomy   09/2005   COLONOSCOPY WITH PROPOFOL N/A 10/21/2015   Procedure: COLONOSCOPY WITH PROPOFOL;  Surgeon: [name], MD;  Location: [facility];  Service: Endoscopy;  Laterality: N/A;   INGUINAL HERNIA REPAIR  08/16/2011   Procedure: HERNIA REPAIR INGUINAL ADULT;  Surgeon: [name], MD;  Location: [facility];  Service: General;  Laterality: Left;   INGUINAL HERNIA REPAIR Right 05/03/2014   Procedure: OPEN RIGHT INGUINAL HERNIA REPAIR WITH MESH;  Surgeon: [name], MD;  Location: [facility];  Service: General;  Laterality: Right;   INGUINAL HERNIA REPAIR Left 08/20/2022   Procedure: HERNIA REPAIR INGUINAL ADULT;  Surgeon: [name], MD;  Location: [facility];  Service: General;  Laterality: Left;   INSERTION OF MESH Right 05/03/2014   Procedure: INSERTION OF MESH;  Surgeon: [name], MD;  Location: [facility];  Service: General;  Laterality: Right;   TONSILLECTOMY  1960    Current Outpatient Medications  Medication Sig Dispense Refill   Alpha-Lipoic Acid 600 MG CAPS Take 600 mg by mouth daily.     amLODipine (NORVASC) 5 MG tablet Take 2 tablets (10 mg total)  by mouth daily. 90 tablet 1   atorvastatin (LIPITOR) 10 MG tablet Take 10 mg by mouth 3 (three) times a week. MWF     busPIRone (BUSPAR) 10 MG tablet Take 20 mg by mouth 3 (three) times daily.      Cholecalciferol (VITAMIN D3) 125 MCG (5000 UT) TABS Take 5,000 Units by mouth daily.     Coenzyme Q10 50 MG CAPS Take 50 mg by mouth daily.     docusate sodium (COLACE) 100 MG capsule Take 1 capsule (100 mg total) by mouth every 12 (twelve) hours. 60 capsule 0   LORazepam (ATIVAN) 1 MG tablet Take 1 mg by mouth 2 (two) times daily.     losartan (COZAAR) 100 MG tablet Take 100 mg by mouth daily.     mirtazapine (REMERON) 45 MG tablet Take 1 tablet (45 mg total) by mouth at bedtime. 30 tablet 0   Multiple Vitamin (MULTIVITAMIN) tablet Take 1 tablet by mouth daily.     Omega-3  1000 MG CAPS Take 1,000 mg by mouth daily.     polyethylene glycol (MIRALAX) 17 g packet Take 17 g by mouth 2 (two) times daily. 14 each 0   pregabalin (LYRICA) 75 MG capsule Take 75 mg by mouth 2 (two) times daily.     senna-docusate (SENOKOT-S) 8.6-50 MG tablet Take 1 tablet by mouth at bedtime as needed for mild constipation. 30 tablet 0   traZODone (DESYREL) 50 MG tablet Take 50-100 mg by mouth at bedtime as needed for sleep.     zaleplon (SONATA) 10 MG capsule Take 10-20 mg by mouth at bedtime as needed for sleep.     zinc gluconate 50 MG tablet Take 50 mg by mouth daily.     No current facility-administered medications for this visit.    Allergies  Allergen Reactions   Duloxetine Hcl Other (See Comments)    Manic emotions   Gabapentin Other (See Comments)    Unable to urinate, drowsiness   Trelegy Ellipta [Fluticasone-Umeclidin-Vilant] Other (See Comments)    Dry cracked lips and mouth   Trileptal [Oxcarbazepine] Other (See Comments)    Bad taste in his mouth    Social History   Socioeconomic History   Marital status: Married    Spouse name: Not on file   Number of children: 2   Years of education: Not on file   Highest education level: Professional school degree (e.g., MD, DDS, DVM, JD)  Occupational History   Occupation: psychologist  Tobacco Use   Smoking status: Never   Smokeless tobacco: Never  Vaping Use   Vaping Use: Never used  Substance and Sexual Activity   Alcohol use: Yes    Comment: 2 wine daily   Drug use: Not Currently    Types: Marijuana    Comment: weekend use   Sexual activity: Yes  Other Topics Concern   Not on file  Social History Narrative   Lives in single level home. He is a self employed Investment banker, operational.     Social Determinants of Health   Financial Resource Strain: Not on file  Food Insecurity: No Food Insecurity (10/20/2022)   Hunger Vital Sign    Worried About Running Out of Food in the Last Year: Never true    [place] in the Last Year: Never true  Transportation Needs: No Transportation Needs (10/20/2022)   PRAPARE - Hydrologist (Medical): No    Lack of Transportation (Non-Medical): No  Physical Activity: Not on file  Stress: Not on file  Social Connections: Not on file  Intimate Partner Violence: Not At Risk (10/20/2022)   Humiliation, Afraid, Rape, and Kick questionnaire    Fear of Current or Ex-Partner: No    Emotionally Abused: No    Physically Abused: No    Sexually Abused: No    Family History  Problem Relation Age of Onset   Cancer Mother        breat cancer,lung cancer   Intracerebral hemorrhage Father     Review of Systems:  As stated in the HPI and otherwise negative.   BP 106/62   Pulse 70   Ht 5\' 9"  (1.753 m)   Wt 60.7 kg   SpO2 99%   BMI 19.76 kg/m   Physical Examination: General: Well developed, well nourished, NAD  HEENT: OP clear, mucus membranes moist  SKIN: warm, dry. No rashes. Neuro: No focal deficits  Musculoskeletal: Muscle strength 5/5 all ext  Psychiatric: Mood and affect normal  Neck: No JVD, no carotid bruits, no thyromegaly, no lymphadenopathy.  Lungs:Clear bilaterally, no wheezes, rhonci, crackles Cardiovascular: Regular rate and rhythm. No murmurs, gallops or rubs. Abdomen:Soft. Bowel sounds present. Non-tender.  Extremities: No lower extremity edema. Pulses are 2 + in the bilateral DP/PT.  EKG:  EKG is not ordered today. The ekg ordered today demonstrates   Recent Labs: 10/21/2022: ALT 18; Magnesium 2.5 10/30/2022: BUN 14; Creatinine, Ser 0.69; Hemoglobin 14.3; Platelets 215; Potassium 3.8; Sodium 130   Lipid Panel    Component Value Date/Time   CHOL 175 02/28/2019 0624   TRIG 29 02/28/2019 0624   HDL 78 02/28/2019 0624   CHOLHDL 2.2 02/28/2019 0624   VLDL 6 02/28/2019 0624   LDLCALC 91 02/28/2019 0624     Wt Readings from Last 3 Encounters:  11/05/22 60.7 kg  11/02/22 58 kg  11/02/22 59 kg    Assessment  and Plan:   1. Chest pain: His pain is atypical and does not cardiac related. I suspect that his symptoms may be due to reactive airways disease. He has been seen in the pulmonary office and is using an inhaler. Given his age, will arrange an exercise treadmill stress test to exclude ischemia. Future follow up with Dr. Johnsie Cancel as needed.   Labs/ tests ordered today include:   Orders Placed This Encounter  Procedures   Exercise Tolerance Test   Disposition:   F/U with Dr. Johnsie Cancel as needed.    Signed, Lauree Chandler, MD, [facility] 11/05/2022 9:53 AM    [address], [place], [place]  [address] Phone: [phone]; Fax: [phone]

## 2022-11-05 ENCOUNTER — Ambulatory Visit: Payer: Medicare Other | Attending: Cardiology | Admitting: Cardiovascular Disease

## 2022-11-05 ENCOUNTER — Encounter: Payer: Self-pay | Admitting: Cardiovascular Disease

## 2022-11-05 VITALS — BP 106/62 | HR 70 | Ht 69.0 in | Wt 133.8 lb

## 2022-11-05 DIAGNOSIS — R079 Chest pain, unspecified: Secondary | ICD-10-CM | POA: Insufficient documentation

## 2022-11-05 NOTE — Patient Instructions (Signed)
Medication Instructions:  No changes *If you need a refill on your cardiac medications before your next appointment, please call your pharmacy*   Lab Work: none If you have labs (blood work) drawn today and your tests are completely normal, you will receive your results only by: [place] (if you have MyChart) OR A paper copy in the mail If you have any lab test that is abnormal or we need to change your treatment, we will call you to review the results.   Testing/Procedures: Your physician has requested that you have an exercise tolerance test. For further information please visit [email or web address]. Please also follow instruction sheet, as given.   Follow-Up: As needed with Dr. Johnsie Cancel

## 2022-11-06 ENCOUNTER — Emergency Department (HOSPITAL_COMMUNITY): Payer: Medicare Other

## 2022-11-06 ENCOUNTER — Encounter (HOSPITAL_COMMUNITY): Payer: Self-pay

## 2022-11-06 ENCOUNTER — Emergency Department (HOSPITAL_COMMUNITY)
Admission: EM | Admit: 2022-11-06 | Discharge: 2022-11-06 | Disposition: A | Discharge status: Home / Self Care | Payer: Medicare Other | Attending: Emergency Medicine | Admitting: Emergency Medicine

## 2022-11-06 ENCOUNTER — Other Ambulatory Visit: Payer: Self-pay

## 2022-11-06 DIAGNOSIS — M542 Cervicalgia: Secondary | ICD-10-CM | POA: Diagnosis not present

## 2022-11-06 DIAGNOSIS — M25519 Pain in unspecified shoulder: Secondary | ICD-10-CM | POA: Diagnosis not present

## 2022-11-06 MED ORDER — CYCLOBENZAPRINE HCL 10 MG PO TABS
10.0000 mg | ORAL_TABLET | Freq: Once | ORAL | Status: AC
  Administered 2022-11-06: 10 mg via ORAL
  Filled 2022-11-06: qty 1

## 2022-11-06 MED ORDER — IBUPROFEN 200 MG PO TABS
600.0000 mg | ORAL_TABLET | Freq: Once | ORAL | Status: AC
  Administered 2022-11-06: 600 mg via ORAL
  Filled 2022-11-06: qty 3

## 2022-11-06 MED ORDER — LORAZEPAM 1 MG PO TABS
1.0000 mg | ORAL_TABLET | Freq: Once | ORAL | Status: AC
  Administered 2022-11-06: 1 mg via ORAL
  Filled 2022-11-06: qty 1

## 2022-11-06 MED ORDER — CYCLOBENZAPRINE HCL 10 MG PO TABS: 10.0000 mg | ORAL_TABLET | Freq: Two times a day (BID) | ORAL | 0 refills | Status: AC | PRN

## 2022-11-06 NOTE — ED Triage Notes (Signed)
EMS states that he slept wrong on his neck and now it is hurting, patient has been anxious in route to ER

## 2022-11-06 NOTE — ED Provider Notes (Signed)
[facility] EMERGENCY DEPARTMENT AT [facility] Provider Note   CSN: [id number] Arrival date & time: 11/06/22  1640     History  Chief Complaint  Patient presents with   Shoulder Pain    Brad [name] is a 67 y.o. male with extensive medical history.  Patient presents to ED for evaluation of neck pain, shoulder pain.  Patient reports that he fell asleep yesterday afternoon on his couch.  Patient reports that his neck was listed to the left and was hanging while he was sleeping.  Patient reports that he woke up after 2 hours of sleeping with severe right-sided and posterior neck pain.  Patient concerned due to past history of cervical fusion.  The patient has not taken any medications at home prior to arrival.  Patient very anxious on examination.  Patient denies any fevers, nausea or vomiting, numbness or tingling.   Shoulder Pain Associated symptoms: neck pain        Home Medications Prior to Admission medications   Medication Sig Start Date End Date Taking? Authorizing Provider  cyclobenzaprine (FLEXERIL) 10 MG tablet Take 1 tablet (10 mg total) by mouth 2 (two) times daily as needed for muscle spasms. 11/06/22  Yes Azucena Cecil, PA-C  Alpha-Lipoic Acid 600 MG CAPS Take 600 mg by mouth daily.    [provider]  amLODipine (NORVASC) 5 MG tablet Take 2 tablets (10 mg total) by mouth daily. 03/02/19   Johnn Hai, MD  atorvastatin (LIPITOR) 10 MG tablet Take 10 mg by mouth 3 (three) times a week. MWF 08/10/19   [provider]  busPIRone (BUSPAR) 10 MG tablet Take 20 mg by mouth 3 (three) times daily.     [provider]  Cholecalciferol (VITAMIN D3) 125 MCG (5000 UT) TABS Take 5,000 Units by mouth daily.    [provider]  Coenzyme Q10 50 MG CAPS Take 50 mg by mouth daily.    [provider]  docusate sodium (COLACE) 100 MG capsule Take 1 capsule (100 mg total) by mouth every 12 (twelve) hours. 09/26/22   Valarie Merino, MD   LORazepam (ATIVAN) 1 MG tablet Take 1 mg by mouth 2 (two) times daily. 10/26/18   [provider]  losartan (COZAAR) 100 MG tablet Take 100 mg by mouth daily. 02/04/20   [provider]  mirtazapine (REMERON) 45 MG tablet Take 1 tablet (45 mg total) by mouth at bedtime. 10/21/22   Raiford Noble Latif, DO  Multiple Vitamin (MULTIVITAMIN) tablet Take 1 tablet by mouth daily.    [provider]  Omega-3 1000 MG CAPS Take 1,000 mg by mouth daily.    [provider]  polyethylene glycol (MIRALAX) 17 g packet Take 17 g by mouth 2 (two) times daily. 10/21/22   Raiford Noble Latif, DO  pregabalin (LYRICA) 75 MG capsule Take 75 mg by mouth 2 (two) times daily.    [provider]  senna-docusate (SENOKOT-S) 8.6-50 MG tablet Take 1 tablet by mouth at bedtime as needed for mild constipation. 10/13/22   Long, Wonda Olds, MD  traZODone (DESYREL) 50 MG tablet Take 50-100 mg by mouth at bedtime as needed for sleep.    [provider]  zaleplon (SONATA) 10 MG capsule Take 10-20 mg by mouth at bedtime as needed for sleep.    [provider]  zinc gluconate 50 MG tablet Take 50 mg by mouth daily.    [provider]      Allergies  Duloxetine hcl, Gabapentin, Trelegy ellipta [fluticasone-umeclidin-vilant], and Trileptal [oxcarbazepine]    Review of Systems   Review of Systems  Musculoskeletal:  Positive for arthralgias and neck pain.  All other systems reviewed and are negative.   Physical Exam Updated Vital Signs Ht '5\' 9"'$  (1.753 m)   Wt 60.7 kg   BMI 19.76 kg/m  Physical Exam Vitals and nursing note reviewed.  Constitutional:      General: He is not in acute distress.    Appearance: He is well-developed.  HENT:     Head: Normocephalic and atraumatic.     Mouth/Throat:     Mouth: Mucous membranes are moist.     Pharynx: Oropharynx is clear.  Eyes:     Conjunctiva/sclera: Conjunctivae normal.  Cardiovascular:     Rate and Rhythm:  Normal rate and regular rhythm.     Heart sounds: No murmur heard. Pulmonary:     Effort: Pulmonary effort is normal. No respiratory distress.     Breath sounds: Normal breath sounds.  Abdominal:     Palpations: Abdomen is soft.     Tenderness: There is no abdominal tenderness.  Musculoskeletal:        General: No swelling.     Cervical back: Neck supple.     Comments: Cervical spine palpated without findings of deformity, crepitus, step-off.  There is nonfocal tenderness throughout.  Patient has full range of motion of neck.  Skin:    General: Skin is warm and dry.     Capillary Refill: Capillary refill takes less than 2 seconds.  Neurological:     General: No focal deficit present.     Mental Status: He is alert and oriented to person, place, and time.     GCS: GCS eye subscore is 4. GCS verbal subscore is 5. GCS motor subscore is 6.     Cranial Nerves: Cranial nerves 2-12 are intact. No cranial nerve deficit.     Sensory: Sensation is intact. No sensory deficit.     Motor: Motor function is intact. No weakness.     Coordination: Coordination is intact. Heel to [facility] Test normal.  Psychiatric:        Mood and Affect: Mood normal.     ED Results / Procedures / Treatments   Labs (all labs ordered are listed, but only abnormal results are displayed) Labs Reviewed - No data to display  EKG None  Radiology DG Cervical Spine Complete  Result Date: 11/06/2022 CLINICAL DATA:  Woke up with neck pain after sleeping wrong EXAM: CERVICAL SPINE - COMPLETE 5 VIEW COMPARISON:  None Available. FINDINGS: Cervical spinal fixation hardware appears intact. There is no evidence of cervical spine fracture or prevertebral soft tissue swelling. Alignment is normal. Degenerative changes above the level of the cervical fixation spanning C3-5 characterized by anterior disc osteophytes and intervertebral disc space narrowing. Right right-greater-than-left neural foraminal narrowing at C3-4. IMPRESSION: 1.  Cervical spinal fixation hardware appears intact. 2. Degenerative changes above the level of the cervical fixation spanning C3-5. 3. No fracture or malalignment. Electronically Signed   By: Darrin Nipper M.D.   On: 11/06/2022 18:10    Procedures Procedures   Medications Ordered in ED Medications  LORazepam (ATIVAN) tablet 1 mg (1 mg Oral Given 11/06/22 1802)  cyclobenzaprine (FLEXERIL) tablet 10 mg (10 mg Oral Given by Other 11/06/22 1802)  ibuprofen (ADVIL) tablet 600 mg (600 mg Oral Given 11/06/22 1802)    ED Course/ Medical Decision Making/ A&P  Medical Decision Making Amount  and/or Complexity of Data Reviewed Radiology: ordered.  Risk OTC drugs. Prescription drug management.   67 year old male presents to the ED for evaluation of right-sided neck pain after sleeping on couch in uncomfortable position.  Please see HPI for further details.  On examination the patient is afebrile, nontachycardic.  Lung sounds are clear bilaterally, he is not hypoxic on room air.  Abdomen soft and compressible throughout.  Normal neurological examination without focal neurodeficits, 5 out of 5 strength of bilateral upper extremities.  Cervical spine palpated without findings of deformity, crepitus or step-off.  Patient does have paraspinal tenderness.  Patient range of motion of cervical spine intact.  Patient plain film imaging of cervical spine shows no acute fractures identified.  Patient provided muscle relaxer, ibuprofen, Ativan for anxiety.  At this time, patient discharged in stable condition.  Patient will be sent home with muscle relaxers and advised to do stretches.  Patient advised to follow-up with PCP.  Patient given return precautions and he voiced understanding.  Patient had all his questions answered to his satisfaction.  I offered patient Lidoderm patches however he reports that Lidoderm patches burn and he does not wish to receive these.  Patient discharged in stable condition.   Final  Clinical Impression(s) / ED Diagnoses Final diagnoses:  Neck pain    Rx / DC Orders ED Discharge Orders          Ordered    cyclobenzaprine (FLEXERIL) 10 MG tablet  2 times daily PRN        11/06/22 1914              Azucena Cecil, PA-C 11/06/22 1915    Lorelle Gibbs, DO 11/27/22 1122

## 2022-11-06 NOTE — Discharge Instructions (Signed)
Return to the ED with any new or worsening signs or symptoms Please follow-up with PCP I referred you to You may take muscle relaxers twice a day as needed for muscle spasms You may take ibuprofen every 6 hours as needed for pain

## 2022-11-08 ENCOUNTER — Emergency Department (HOSPITAL_COMMUNITY)
Admission: EM | Admit: 2022-11-08 | Discharge: 2022-11-09 | Discharge status: Against Medical Advice | Payer: Medicare Other | Attending: Emergency Medicine | Admitting: Emergency Medicine

## 2022-11-08 ENCOUNTER — Other Ambulatory Visit: Payer: Self-pay

## 2022-11-08 DIAGNOSIS — G8928 Other chronic postprocedural pain: Secondary | ICD-10-CM | POA: Insufficient documentation

## 2022-11-08 DIAGNOSIS — E871 Hypo-osmolality and hyponatremia: Secondary | ICD-10-CM | POA: Diagnosis not present

## 2022-11-08 DIAGNOSIS — R1032 Left lower quadrant pain: Secondary | ICD-10-CM | POA: Insufficient documentation

## 2022-11-08 DIAGNOSIS — Z5321 Procedure and treatment not carried out due to patient leaving prior to being seen by health care provider: Secondary | ICD-10-CM | POA: Insufficient documentation

## 2022-11-08 DIAGNOSIS — R0602 Shortness of breath: Secondary | ICD-10-CM | POA: Diagnosis not present

## 2022-11-08 NOTE — ED Triage Notes (Signed)
Pt report LLQ abdominal pain at the site of his inguinal hernia repair. Worsened this morning. Denies nausea or vomiting. Last bowel movement was with multiple days ago. He has not been taking any laxatives. Pt often here with similar complaints. Also reports that he is jittery and anxious.

## 2022-11-08 NOTE — ED Notes (Signed)
Pt reports to registration that his cab is waiting outside.

## 2022-11-08 NOTE — ED Provider Triage Note (Signed)
Emergency Medicine Provider Triage Evaluation Note  Quentrell Rodeman Mullin , a 67 y.o. male  was evaluated in triage.  Pt complains of left lower quadrant abdominal pain at the site of his inguinal hernia repair.  States he typically has pain here.  He got significantly worse this morning.  Denies nausea or vomiting.  Last bowel movement was with multiple days ago.  He has not been taking any laxatives.  Review of Systems  Positive: As above Negative: As above  Physical Exam  BP (!) 150/101 (BP Location: Left Arm)   Pulse 83   Temp 98.4 F (36.9 C) (Oral)   Resp (!) 24   Ht '5\' 9"'$  (1.753 m)   Wt 56.7 kg   SpO2 100%   BMI 18.46 kg/m  Gen:   Awake, very anxious and restless Resp:  Normal effort  MSK:   Moves extremities without difficulty  Other:    Medical Decision Making  Medically screening exam initiated at 10:48 PM.  Appropriate orders placed.  Rosario C Lattner was informed that the remainder of the evaluation will be completed by another provider, this initial triage assessment does not replace that evaluation, and the importance of remaining in the ED until their evaluation is complete.  He has had numerous visits for similar complaints with multiple scans   Roylene Reason, Hershal Coria 11/08/22 2250

## 2022-11-09 ENCOUNTER — Emergency Department (HOSPITAL_COMMUNITY): Payer: Medicare Other

## 2022-11-09 ENCOUNTER — Encounter (HOSPITAL_COMMUNITY): Payer: Self-pay

## 2022-11-09 ENCOUNTER — Emergency Department (HOSPITAL_COMMUNITY)
Admission: EM | Admit: 2022-11-09 | Discharge: 2022-11-09 | Disposition: A | Discharge status: Home / Self Care | Payer: Medicare Other | Source: Home / Self Care | Attending: Emergency Medicine | Admitting: Emergency Medicine

## 2022-11-09 DIAGNOSIS — G8928 Other chronic postprocedural pain: Secondary | ICD-10-CM

## 2022-11-09 DIAGNOSIS — E871 Hypo-osmolality and hyponatremia: Secondary | ICD-10-CM | POA: Insufficient documentation

## 2022-11-09 DIAGNOSIS — R0602 Shortness of breath: Secondary | ICD-10-CM | POA: Insufficient documentation

## 2022-11-09 DIAGNOSIS — R1030 Lower abdominal pain, unspecified: Secondary | ICD-10-CM | POA: Insufficient documentation

## 2022-11-09 DIAGNOSIS — R1032 Left lower quadrant pain: Secondary | ICD-10-CM | POA: Diagnosis not present

## 2022-11-09 LAB — COMPREHENSIVE METABOLIC PANEL
ALT: 22 U/L (ref 0–44)
AST: 25 U/L (ref 15–41)
Albumin: 4.6 g/dL (ref 3.5–5.0)
Alkaline Phosphatase: 68 U/L (ref 38–126)
Anion gap: 10 (ref 5–15)
BUN: 12 mg/dL (ref 8–23)
CO2: 24 mmol/L (ref 22–32)
Calcium: 9 mg/dL (ref 8.9–10.3)
Chloride: 94 mmol/L — ABNORMAL LOW (ref 98–111)
Creatinine, Ser: 0.68 mg/dL (ref 0.61–1.24)
GFR, Estimated: >60 mL/min (ref >60)
Glucose, Bld: 91 mg/dL (ref 70–99)
Potassium: 4 mmol/L (ref 3.5–5.1)
Sodium: 128 mmol/L — ABNORMAL LOW (ref 135–145)
Total Bilirubin: 0.8 mg/dL (ref 0.3–1.2)
Total Protein: 7 g/dL (ref 6.5–8.1)

## 2022-11-09 LAB — CBC WITH DIFFERENTIAL/PLATELET
Abs Immature Granulocytes: 0.01 10*3/uL (ref 0.00–0.07)
Basophils Absolute: 0 10*3/uL (ref 0.0–0.1)
Basophils Relative: 1 %
Eosinophils Absolute: 0.1 10*3/uL (ref 0.0–0.5)
Eosinophils Relative: 2 %
HCT: 39.9 % (ref 39.0–52.0)
Hemoglobin: 14.1 g/dL (ref 13.0–17.0)
Immature Granulocytes: 0 %
Lymphocytes Relative: 21 %
Lymphs Abs: 1.2 10*3/uL (ref 0.7–4.0)
MCH: 32 pg (ref 26.0–34.0)
MCHC: 35.3 g/dL (ref 30.0–36.0)
MCV: 90.5 fL (ref 80.0–100.0)
Monocytes Absolute: 0.6 10*3/uL (ref 0.1–1.0)
Monocytes Relative: 10 %
Neutro Abs: 3.7 10*3/uL (ref 1.7–7.7)
Neutrophils Relative %: 66 %
Platelets: 209 10*3/uL (ref 150–400)
RBC: 4.41 MIL/uL (ref 4.22–5.81)
RDW: 12.9 % (ref 11.5–15.5)
WBC: 5.6 10*3/uL (ref 4.0–10.5)
nRBC: 0 % (ref 0.0–0.2)

## 2022-11-09 MED ORDER — SODIUM CHLORIDE 0.9 % IV BOLUS: 1000.0000 mL | Freq: Once | INTRAVENOUS | Status: DC

## 2022-11-09 MED ORDER — DICYCLOMINE HCL 10 MG PO CAPS
10.0000 mg | ORAL_CAPSULE | Freq: Once | ORAL | Status: AC
  Administered 2022-11-09: 10 mg via ORAL
  Filled 2022-11-09: qty 1

## 2022-11-09 MED ORDER — ACETAMINOPHEN 325 MG PO TABS
650.0000 mg | ORAL_TABLET | Freq: Once | ORAL | Status: AC
  Administered 2022-11-09: 650 mg via ORAL
  Filled 2022-11-09: qty 2

## 2022-11-09 NOTE — Discharge Instructions (Signed)
You were seen in the emergency department for your groin pain and your shortness of breath.  You had no signs of any complications from your hernia this is likely postoperative pain.  You should continue to take your stool softeners as recommended by your surgeon and you can take Tylenol as needed for additional pain.  You can follow-up with your general surgeon to have your pain reassessed and to determine a good pain plan.  You can follow-up in the primary care clinic to have your shortness of breath reevaluated and to further determine what is causing her shortness of breath.  You should return to the emergency department if you are having a hernia that is stuck out that you can no longer pushing, you have repetitive vomiting, you have severe chest pain, you pass out or if you have any other new or concerning symptoms.

## 2022-11-09 NOTE — ED Provider Notes (Signed)
[place] EMERGENCY DEPARTMENT AT [facility] Provider Note   CSN: [id number] Arrival date & time: 11/09/22  1202     History  Chief Complaint  Patient presents with   Inguinal Hernia    Brad [name] is a 67 y.o. male.  Patient is a 67 year old male with past medical history of left inguinal hernia repair 3 months ago, depression, GERD presenting to the emergency department complaining of shortness of breath and left inguinal pain.  The patient has been seen in the emergency department multiple times for shortness of breath and states that there are pieces of glass in his house a filtration system from the HVAC that is causing his shortness of breath.  He denies any associated chest pain.  He states this is similar that he has been dealing with for the past several months.  He did have a recent admission for cardiac evaluation was normal on recent cardiology follow-up.  Patient also reports that he has had increasing pain near his surgical scar from his hernia repair with some swelling.  He states that he feels like his testicles are swollen.  He denies any nausea or vomiting.  He states he has been constipated but is still passing gas.  The history is provided by the patient.       Home Medications Prior to Admission medications   Medication Sig Start Date End Date Taking? Authorizing Provider  Alpha-Lipoic Acid 600 MG CAPS Take 600 mg by mouth daily.    [provider]  amLODipine (NORVASC) 5 MG tablet Take 2 tablets (10 mg total) by mouth daily. 03/02/19   Johnn Hai, MD  atorvastatin (LIPITOR) 10 MG tablet Take 10 mg by mouth 3 (three) times a week. MWF 08/10/19   [provider]  busPIRone (BUSPAR) 10 MG tablet Take 20 mg by mouth 3 (three) times daily.     [provider]  Cholecalciferol (VITAMIN D3) 125 MCG (5000 UT) TABS Take 5,000 Units by mouth daily.    [provider]  Coenzyme Q10 50 MG CAPS Take 50 mg by mouth daily.     [provider]  cyclobenzaprine (FLEXERIL) 10 MG tablet Take 1 tablet (10 mg total) by mouth 2 (two) times daily as needed for muscle spasms. 11/06/22   Azucena Cecil, PA-C  docusate sodium (COLACE) 100 MG capsule Take 1 capsule (100 mg total) by mouth every 12 (twelve) hours. 09/26/22   Valarie Merino, MD  LORazepam (ATIVAN) 1 MG tablet Take 1 mg by mouth 2 (two) times daily. 10/26/18   [provider]  losartan (COZAAR) 100 MG tablet Take 100 mg by mouth daily. 02/04/20   [provider]  mirtazapine (REMERON) 45 MG tablet Take 1 tablet (45 mg total) by mouth at bedtime. 10/21/22   Raiford Noble Latif, DO  Multiple Vitamin (MULTIVITAMIN) tablet Take 1 tablet by mouth daily.    [provider]  Omega-3 1000 MG CAPS Take 1,000 mg by mouth daily.    [provider]  polyethylene glycol (MIRALAX) 17 g packet Take 17 g by mouth 2 (two) times daily. 10/21/22   Raiford Noble Latif, DO  pregabalin (LYRICA) 75 MG capsule Take 75 mg by mouth 2 (two) times daily.    [provider]  senna-docusate (SENOKOT-S) 8.6-50 MG tablet Take 1 tablet by mouth at bedtime as needed for mild constipation. 10/13/22   Long, Wonda Olds, MD  traZODone (DESYREL) 50 MG tablet Take 50-100 mg by mouth  at bedtime as needed for sleep.    [provider]  zaleplon (SONATA) 10 MG capsule Take 10-20 mg by mouth at bedtime as needed for sleep.    [provider]  zinc gluconate 50 MG tablet Take 50 mg by mouth daily.    [provider]      Allergies    Ciprofloxacin, Citalopram, Duloxetine, Duloxetine hcl, Fluticasone-umeclidin-vilant, Gabapentin, and Oxcarbazepine    Review of Systems   Review of Systems  Physical Exam Updated Vital Signs BP (!) 137/99 (BP Location: Left Arm)   Pulse 79   Temp 98 F (36.7 C) (Oral)   Resp 16   SpO2 97%  Physical Exam Vitals and nursing note reviewed. Exam conducted with a chaperone present (Themmy Ksor NT).   Constitutional:      General: He is not in acute distress.    Appearance: Normal appearance.  HENT:     Head: Normocephalic and atraumatic.     Nose: Nose normal.     Mouth/Throat:     Pharynx: Oropharynx is clear.  Eyes:     Extraocular Movements: Extraocular movements intact.     Conjunctiva/sclera: Conjunctivae normal.  Cardiovascular:     Rate and Rhythm: Normal rate and regular rhythm.     Heart sounds: Normal heart sounds.  Pulmonary:     Effort: Pulmonary effort is normal.     Breath sounds: Normal breath sounds.  Abdominal:     General: Abdomen is flat.     Palpations: Abdomen is soft.     Tenderness: There is no abdominal tenderness.  Genitourinary:    Penis: Normal.      Testes: Normal.     Comments: L inguinal scar with mild overlying swelling that is easily reducible, no overlying erythema or warmth, no fluctuance  Musculoskeletal:        General: Normal range of motion.     Cervical back: Normal range of motion and neck supple.  Skin:    General: Skin is warm and dry.  Neurological:     General: No focal deficit present.     Mental Status: He is alert and oriented to person, place, and time.  Psychiatric:        Mood and Affect: Mood normal.        Behavior: Behavior normal.     ED Results / Procedures / Treatments   Labs (all labs ordered are listed, but only abnormal results are displayed) Labs Reviewed  COMPREHENSIVE METABOLIC PANEL - Abnormal; Notable for the following components:      Result Value   Sodium 128 (*)    Chloride 94 (*)    All other components within normal limits  CBC WITH DIFFERENTIAL/PLATELET    EKG None  Radiology DG Abd Acute W/Chest  Result Date: 11/09/2022 CLINICAL DATA:  Inguinal hernia with pain radiating to testicles, shortness of breath EXAM: DG ABDOMEN ACUTE WITH 1 VIEW CHEST COMPARISON:  10/30/2022 FINDINGS: Supine and upright frontal views of the abdomen and pelvis as well as an upright frontal view of the chest  are obtained. Cardiac silhouette is stable. No acute airspace disease, effusion, or pneumothorax. No bowel obstruction or ileus. Minimal stool within the distal colon. There are no masses or abnormal calcifications. No free gas in the greater peritoneal sac. Stable colonic interposition right upper quadrant. No acute bony abnormalities.  S shaped thoracolumbar scoliosis. IMPRESSION: 1. No acute intrathoracic process. 2. Unremarkable bowel gas pattern. Electronically Signed   By: Randa Ngo  M.D.   On: 11/09/2022 18:03    Procedures Procedures    Medications Ordered in ED Medications  sodium chloride 0.9 % bolus 1,000 mL (1,000 mLs Intravenous Patient Refused/Not Given 11/09/22 1753)  acetaminophen (TYLENOL) tablet 650 mg (650 mg Oral Given 11/09/22 1721)  dicyclomine (BENTYL) capsule 10 mg (10 mg Oral Given 11/09/22 1722)    ED Course/ Medical Decision Making/ A&P Clinical Course as of 11/09/22 1905  Tue Nov 09, 2022  1819 No evidence of obstruction or acute disease on CXR, Abd XR. Symptoms appear consistent with prior visits with SOB and recent evaluation by surgery inpatient with pain at his surgical site. He is stable for discharge with outpatient surgery and PCP follow up. [VK]    Clinical Course User Index [VK] Kemper Durie, DO                             Medical Decision Making This patient presents to the ED with chief complaint(s) of SOB, groin pain with pertinent past medical history of inguinal hernia repair, depression, GERD which further complicates the presenting complaint. The complaint involves an extensive differential diagnosis and also carries with it a high risk of complications and morbidity.    The differential diagnosis includes patient has no evidence of strangulated or incarcerated hernia on exam, no evidence of cellulitis or abscess, considering postop pain, considering ileus, constipation, he is passing gas and has had no vomiting making SBO unlikely,  considering pneumonia, pneumothorax, pulmonary edema, pleural effusion, pneumonitis  Additional history obtained: Additional history obtained from N/A Records reviewed previous admission documents and outpatient cardiology records, recent ED visits  ED Course and Reassessment: Patient was initially evaluated by provider in triage and had labs performed that showed mild hyponatremia and hypochloremia.  The patient was offered IV fluids but did not want an IV placed and was given p.o. fluid rehydration.  He left chest and abdomen x-rays performed to evaluate for cardiopulmonary disease, obstruction or ileus or constipation contributing to his symptoms.  He was given Tylenol and Bentyl for pain.  Independent labs interpretation:  The following labs were independently interpreted: mild hyponatremia, otherwise within normal range  Independent visualization of imaging: - I independently visualized the following imaging with scope of interpretation limited to determining acute life threatening conditions related to emergency care: CXR, Abd XR, which revealed no acute disease  Consultation: - Consulted or discussed management/test interpretation w/ external professional: N/A  Consideration for admission or further workup: Patient has no emergent conditions requiring admission or further work-up at this time and is stable for discharge home with primary care follow-up  Social Determinants of health: N/A    Amount and/or Complexity of Data Reviewed Radiology: ordered.  Risk OTC drugs. Prescription drug management.          Final Clinical Impression(s) / ED Diagnoses Final diagnoses:  Chronic post-operative pain  SOB (shortness of breath)    Rx / DC Orders ED Discharge Orders     None         Kemper Durie, DO 11/09/22 1905

## 2022-11-09 NOTE — ED Notes (Signed)
Pt called for room. Pt no longer visualized in waiting room.

## 2022-11-09 NOTE — ED Provider Triage Note (Signed)
Emergency Medicine Provider Triage Evaluation Note  Brad [name] , a 67 y.o. male  was evaluated in triage.  Pt complains of abdominal pain radiating to his testicles.Here for his 28th visit in the past 6 months. Also swallowed a piece of hair which is causing him to breath in plastic?Marland Kitchen  Review of Systems  Positive: Abdominal pain, inguinal hernia Negative: Fever, nausea, vomiting  Physical Exam  BP (!) 131/93 (BP Location: Left Arm)   Pulse 77   Temp 98.1 F (36.7 C) (Oral)   SpO2 100%  Gen:   Awake, no distress   Resp:  Normal effort  MSK:   Moves extremities without difficulty  Other:    Medical Decision Making  Medically screening exam initiated at 12:18 PM.  Appropriate orders placed.  Brad [name] was informed that the remainder of the evaluation will be completed by another provider, this initial triage assessment does not replace that evaluation, and the importance of remaining in the ED until their evaluation is complete.     Janeece Fitting, PA-C 11/09/22 1219

## 2022-11-09 NOTE — ED Triage Notes (Signed)
BIBA with c/o pain from inguinal hernia radiating into testicles. Pt reports swallowed a piece of hair and now having sob.  Pt reports "I have been breathing in plastics."

## 2022-11-09 NOTE — ED Notes (Signed)
Patient transported to X-ray 

## 2022-11-10 ENCOUNTER — Other Ambulatory Visit: Payer: Self-pay

## 2022-11-10 ENCOUNTER — Emergency Department (HOSPITAL_COMMUNITY): Payer: Medicare Other

## 2022-11-10 ENCOUNTER — Emergency Department (HOSPITAL_COMMUNITY)
Admission: EM | Admit: 2022-11-10 | Discharge: 2022-11-11 | Disposition: A | Discharge status: Home / Self Care | Payer: Medicare Other | Attending: Emergency Medicine | Admitting: Emergency Medicine

## 2022-11-10 ENCOUNTER — Emergency Department (HOSPITAL_COMMUNITY)
Admission: EM | Admit: 2022-11-10 | Discharge: 2022-11-10 | Disposition: A | Discharge status: Home / Self Care | Payer: Medicare Other | Attending: Emergency Medicine | Admitting: Emergency Medicine

## 2022-11-10 ENCOUNTER — Encounter (HOSPITAL_COMMUNITY): Payer: Self-pay

## 2022-11-10 DIAGNOSIS — I10 Essential (primary) hypertension: Secondary | ICD-10-CM | POA: Insufficient documentation

## 2022-11-10 DIAGNOSIS — R0789 Other chest pain: Secondary | ICD-10-CM | POA: Diagnosis not present

## 2022-11-10 DIAGNOSIS — R109 Unspecified abdominal pain: Secondary | ICD-10-CM | POA: Insufficient documentation

## 2022-11-10 DIAGNOSIS — R0609 Other forms of dyspnea: Secondary | ICD-10-CM

## 2022-11-10 DIAGNOSIS — Z79899 Other long term (current) drug therapy: Secondary | ICD-10-CM | POA: Insufficient documentation

## 2022-11-10 DIAGNOSIS — R079 Chest pain, unspecified: Secondary | ICD-10-CM | POA: Insufficient documentation

## 2022-11-10 LAB — CBC
HCT: 40.3 % (ref 39.0–52.0)
Hemoglobin: 14.1 g/dL (ref 13.0–17.0)
MCH: 31.5 pg (ref 26.0–34.0)
MCHC: 35 g/dL (ref 30.0–36.0)
MCV: 90 fL (ref 80.0–100.0)
Platelets: 204 10*3/uL (ref 150–400)
RBC: 4.48 MIL/uL (ref 4.22–5.81)
RDW: 13 % (ref 11.5–15.5)
WBC: 5.7 10*3/uL (ref 4.0–10.5)
nRBC: 0 % (ref 0.0–0.2)

## 2022-11-10 LAB — TROPONIN I (HIGH SENSITIVITY)
Troponin I (High Sensitivity): 4 ng/L (ref <18)
Troponin I (High Sensitivity): 6 ng/L (ref <18)

## 2022-11-10 LAB — BASIC METABOLIC PANEL
Anion gap: 9 (ref 5–15)
BUN: 7 mg/dL — ABNORMAL LOW (ref 8–23)
CO2: 25 mmol/L (ref 22–32)
Calcium: 9.4 mg/dL (ref 8.9–10.3)
Chloride: 96 mmol/L — ABNORMAL LOW (ref 98–111)
Creatinine, Ser: 0.79 mg/dL (ref 0.61–1.24)
GFR, Estimated: >60 mL/min (ref >60)
Glucose, Bld: 108 mg/dL — ABNORMAL HIGH (ref 70–99)
Potassium: 3.6 mmol/L (ref 3.5–5.1)
Sodium: 130 mmol/L — ABNORMAL LOW (ref 135–145)

## 2022-11-10 MED ORDER — LORAZEPAM 1 MG PO TABS
1.0000 mg | ORAL_TABLET | Freq: Once | ORAL | Status: AC
  Administered 2022-11-10: 1 mg via ORAL
  Filled 2022-11-10: qty 1

## 2022-11-10 NOTE — ED Provider Notes (Signed)
[place] Provider Note   CSN: [id number] Arrival date & time: 11/10/22  0941     History  Chief Complaint  Patient presents with   Chest Pain   Leg Pain    Brad [name] is a 67 y.o. male with history of anxiety, chest pain, depression, DJD, GERD, hypertension, inguinal hernia.  Patient presents to ED for evaluation of chest pain.  Patient reports that beginning this morning he was awoken from his sleep with centralized nonradiating chest pain associated with shortness of breath.  Patient denies any syncope, nausea, vomiting, lightheadedness, leg swelling, fevers.  Patient does endorse dizziness.  Patient denies falling.  Patient denies any alleviating or aggravating factors.  Patient denies medications prior to arrival.  Patient is complaining of left-sided inguinal hernia pain, he was seen for the same complaint yesterday at [facility] long ED and had an unremarkable workup.  Patient was set to follow-up with surgical clinic for reevaluation of hernia pain.   Chest Pain Associated symptoms: abdominal pain, dizziness and shortness of breath   Leg Pain      Home Medications Prior to Admission medications   Medication Sig Start Date End Date Taking? Authorizing Provider  Alpha-Lipoic Acid 600 MG CAPS Take 600 mg by mouth daily.    [provider]  amLODipine (NORVASC) 5 MG tablet Take 2 tablets (10 mg total) by mouth daily. 03/02/19   Johnn Hai, MD  atorvastatin (LIPITOR) 10 MG tablet Take 10 mg by mouth 3 (three) times a week. MWF 08/10/19   [provider]  busPIRone (BUSPAR) 10 MG tablet Take 20 mg by mouth 3 (three) times daily.     [provider]  Cholecalciferol (VITAMIN D3) 125 MCG (5000 UT) TABS Take 5,000 Units by mouth daily.    [provider]  Coenzyme Q10 50 MG CAPS Take 50 mg by mouth daily.    [provider]  cyclobenzaprine (FLEXERIL) 10 MG tablet Take 1 tablet (10 mg total) by  mouth 2 (two) times daily as needed for muscle spasms. 11/06/22   Azucena Cecil, PA-C  docusate sodium (COLACE) 100 MG capsule Take 1 capsule (100 mg total) by mouth every 12 (twelve) hours. 09/26/22   Valarie Merino, MD  LORazepam (ATIVAN) 1 MG tablet Take 1 mg by mouth 2 (two) times daily. 10/26/18   [provider]  losartan (COZAAR) 100 MG tablet Take 100 mg by mouth daily. 02/04/20   [provider]  mirtazapine (REMERON) 45 MG tablet Take 1 tablet (45 mg total) by mouth at bedtime. 10/21/22   Raiford Noble Latif, DO  Multiple Vitamin (MULTIVITAMIN) tablet Take 1 tablet by mouth daily.    [provider]  Omega-3 1000 MG CAPS Take 1,000 mg by mouth daily.    [provider]  polyethylene glycol (MIRALAX) 17 g packet Take 17 g by mouth 2 (two) times daily. 10/21/22   Raiford Noble Latif, DO  pregabalin (LYRICA) 75 MG capsule Take 75 mg by mouth 2 (two) times daily.    [provider]  senna-docusate (SENOKOT-S) 8.6-50 MG tablet Take 1 tablet by mouth at bedtime as needed for mild constipation. 10/13/22   Long, Wonda Olds, MD  traZODone (DESYREL) 50 MG tablet Take 50-100 mg by mouth at bedtime as needed for sleep.    [provider]  zaleplon (SONATA) 10 MG capsule Take 10-20 mg by mouth at bedtime as needed for sleep.    [provider]  zinc gluconate 50 MG tablet Take 50 mg by mouth daily.    [provider]      Allergies    Ciprofloxacin, Citalopram, Duloxetine, Duloxetine hcl, Fluticasone-umeclidin-vilant, Gabapentin, and Oxcarbazepine    Review of Systems   Review of Systems  Respiratory:  Positive for shortness of breath.   Cardiovascular:  Positive for chest pain.  Gastrointestinal:  Positive for abdominal pain.  Neurological:  Positive for dizziness.  All other systems reviewed and are negative.   Physical Exam Updated Vital Signs BP (!) 145/97   Pulse 77   Temp 98.1 F (36.7 C)   Resp 18   SpO2 97%   Physical Exam Vitals and nursing note reviewed.  Constitutional:      General: He is not in acute distress.    Appearance: Normal appearance. He is not ill-appearing, toxic-appearing or diaphoretic.  HENT:     Head: Normocephalic and atraumatic.     Nose: Nose normal. No congestion.     Mouth/Throat:     Mouth: Mucous membranes are moist.     Pharynx: Oropharynx is clear.  Eyes:     Extraocular Movements: Extraocular movements intact.     Conjunctiva/sclera: Conjunctivae normal.     Pupils: Pupils are equal, round, and reactive to light.  Cardiovascular:     Rate and Rhythm: Normal rate and regular rhythm.  Pulmonary:     Effort: Pulmonary effort is normal.     Breath sounds: No wheezing.  Abdominal:     General: Abdomen is flat. Bowel sounds are normal.     Palpations: Abdomen is soft.     Tenderness: There is abdominal tenderness.     Comments: Left lower inguinal pain. No obvious hernia in need of reduction. No guarding, rebound.  Musculoskeletal:     Cervical back: Normal range of motion and neck supple. No tenderness.     Right lower leg: No edema.     Left lower leg: No edema.  Skin:    General: Skin is warm and dry.     Capillary Refill: Capillary refill takes less than 2 seconds.  Neurological:     Mental Status: He is alert and oriented to person, place, and time.     ED Results / Procedures / Treatments   Labs (all labs ordered are listed, but only abnormal results are displayed) Labs Reviewed  BASIC METABOLIC PANEL - Abnormal; Notable for the following components:      Result Value   Sodium 130 (*)    Chloride 96 (*)    Glucose, Bld 108 (*)    BUN 7 (*)    All other components within normal limits  CBC  TROPONIN I (HIGH SENSITIVITY)  TROPONIN I (HIGH SENSITIVITY)    EKG EKG Interpretation  Date/Time:  Wednesday November 10 2022 09:53:35 EDT Ventricular Rate:  94 PR Interval:    QRS Duration: 86 QT Interval:  372 QTC Calculation: 465 R  Axis:   -56 Text Interpretation: Sinus rhythm Left anterior fascicular block Nonspecific T wave abnormality Prolonged QT Significant artifact prevents thorough interpretation Confirmed by Cindee Lame [phone]) on 11/10/2022 10:02:51 AM  Radiology DG Chest 2 View  Result Date: 11/10/2022 CLINICAL DATA:  Provided history: Chest pain. Shortness of breath. EXAM: CHEST - 2 VIEW COMPARISON:  Prior chest radiographs 11/09/2022 and earlier. FINDINGS: Mild cardiomegaly, unchanged. Aortic atherosclerosis. No appreciable airspace consolidation or pulmonary edema. No evidence of pleural effusion or pneumothorax. Dextrocurvature and spondylosis of the thoracic spine.  ACDF hardware. IMPRESSION: 1. No evidence of acute cardiopulmonary abnormality. 2. Mild cardiomegaly. 3.  Aortic Atherosclerosis (ICD10-I70.0). Electronically Signed   By: Kellie Simmering D.O.   On: 11/10/2022 10:39   DG Abd Acute W/Chest  Result Date: 11/09/2022 CLINICAL DATA:  Inguinal hernia with pain radiating to testicles, shortness of breath EXAM: DG ABDOMEN ACUTE WITH 1 VIEW CHEST COMPARISON:  10/30/2022 FINDINGS: Supine and upright frontal views of the abdomen and pelvis as well as an upright frontal view of the chest are obtained. Cardiac silhouette is stable. No acute airspace disease, effusion, or pneumothorax. No bowel obstruction or ileus. Minimal stool within the distal colon. There are no masses or abnormal calcifications. No free gas in the greater peritoneal sac. Stable colonic interposition right upper quadrant. No acute bony abnormalities.  S shaped thoracolumbar scoliosis. IMPRESSION: 1. No acute intrathoracic process. 2. Unremarkable bowel gas pattern. Electronically Signed   By: Randa Ngo M.D.   On: 11/09/2022 18:03    Procedures Procedures   Medications Ordered in ED Medications  LORazepam (ATIVAN) tablet 1 mg (1 mg Oral Given 11/10/22 1658)    ED Course/ Medical Decision Making/ A&P  Medical Decision Making Amount  and/or Complexity of Data Reviewed Labs: ordered. Radiology: ordered.  Risk Prescription drug management.   67 year old presents to the ED for evaluation.  Please see HPI for further details.  On examination patient is afebrile and nontachycardic.  Lung sounds are clear bilaterally, he is not hypoxic on room air.  Abdomen soft compressible throughout.  Normal neurological examination.  No peripheral edema to his lower extremities.  Patient seen here for the 22nd time this year.  Patient CBC unremarkable without leukocytosis or anemia.  BMP with decreased sodium to 130 however all of the labs at baseline.  Troponin 4 then 6 respectively.  EKG nonischemic.  Chest x-ray shows no consolidations or effusions.  Patient complained very similar to past complaints for chest pain, abdominal pain.  Patient was seen yesterday for complaint of abdominal pain and had unremarkable workup at outside facility.  At this time, patient presentation similar to past episodes.  Patient recently saw cardiology with unremarkable workup.  The patient will be advised to follow-up with cardiology, his PCP.  Patient given return precautions and he voiced understanding.  Patient had all of his questions answered to his satisfaction.  Patient advised to follow-up with PCP as well as with cardiology for further management.  Patient advised to return to ED with any new symptoms or return of chest pain, shortness of breath, nausea or vomiting.  Patient voiced understanding.  Patient stable for discharge.   Final Clinical Impression(s) / ED Diagnoses Final diagnoses:  Chest pain, unspecified type    Rx / DC Orders ED Discharge Orders     None         Azucena Cecil, PA-C 11/10/22 2001    Lorelle Gibbs, DO 11/10/22 2342

## 2022-11-10 NOTE — ED Notes (Addendum)
Pt in w/r called out to this passing RN. c/o R AC angio cath "coming out". Undocumented IV present on assessment. NSL securely taped. No bleeding or swelling. Angiocath does not advance all the way to the hub.

## 2022-11-10 NOTE — Discharge Instructions (Signed)
Return to the ED with any new symptoms or return of symptoms such as chest pain, shortness of breath, loss of consciousness Please follow-up with cardiology for further management Please read the attached guide concerning nonspecific chest pain

## 2022-11-10 NOTE — ED Triage Notes (Signed)
Pt with central chest pain that is burning in nature with associated sob. Seen yesterday for hernia pain and discharged from [facility]. 100 mcg Fentanyl given PTA. Pt very anxious in triage and states he is panicking and has hernia pain.

## 2022-11-10 NOTE — ED Notes (Signed)
Patient complaining of pain from IV. IV would not flush. This RN removed IV.

## 2022-11-10 NOTE — ED Triage Notes (Signed)
Arrives EMS from home.   Just discharged from [facility] prior to arrival.   Very jittery, and multiple areas of pain. One is left inguinal hernia recently seen for and points to the chest. Recent blood work that has resulted within last several hours.

## 2022-11-10 NOTE — ED Provider Triage Note (Signed)
Emergency Medicine Provider Triage Evaluation Note  Brad [name] , a 67 y.o. male  was evaluated in triage.  Pt complains of chest pain and leg pain. Was seen at [facility] earlier today for similar concerns with a negative workup.  Patient notes that his pain has become more intense since being discharged.  Also notes lower abdominal pain.  Has associated nausea.  Denies vomiting.  Review of Systems  Positive:  Negative:   Physical Exam  BP (!) 120/92 (BP Location: Left Arm)   Pulse 86   Temp 98.1 F (36.7 C) (Oral)   Resp (!) 22   SpO2 100% Comment: Simultaneous filing. User may not have seen previous data. Gen:   Awake, no distress   Resp:  Normal effort  MSK:   Moves extremities without difficulty  Other:  Tenderness to palpation with light palpation of abdomen.  Patient jittery and anxious appearing on exam.  Medical Decision Making  Medically screening exam initiated at 11:29 PM.  Appropriate orders placed.  Brad [name] was informed that the remainder of the evaluation will be completed by another provider, this initial triage assessment does not replace that evaluation, and the importance of remaining in the ED until their evaluation is complete.  Work-up initiated.    Hellena Pridgen A, PA-C 11/10/22 2329

## 2022-11-10 NOTE — ED Provider Triage Note (Signed)
Emergency Medicine Provider Triage Evaluation Note  Brad [name] , a 67 y.o. male  was evaluated in triage. Patient presents for his 21st visit to the ED in 2024. Pt complains of chest pain radiating to his bilateral legs and vice versa, bilateral leg pain radiating to his chest. Never has had pain like this before. States it started yesterday. Was seen at [facility] ED yesterday for SOB and L inguinal pain, had easily reducible L inguinal hernia on exam and negative w/u, was DC'd home.   Review of Systems  Positive: Chest pain, shortness of breath, leg pain Negative: Fevers/chills  Physical Exam  BP 135/75   Pulse 85   Temp 98.1 F (36.7 C) (Oral)   Resp 16   SpO2 100%  Gen:   Awake, mildly uncomfortable appearing Resp:  Normal effort, no hypoxia MSK:   Moves extremities without difficulty  Medical Decision Making  Medically screening exam initiated at 9:52 AM.  Appropriate orders placed.  Brad [name] was informed that the remainder of the evaluation will be completed by another provider, this initial triage assessment does not replace that evaluation, and the importance of remaining in the ED until their evaluation is complete.    Audley Hose, MD 11/10/22 1001

## 2022-11-11 ENCOUNTER — Ambulatory Visit: Payer: Medicare Other

## 2022-11-11 DIAGNOSIS — R079 Chest pain, unspecified: Secondary | ICD-10-CM | POA: Diagnosis not present

## 2022-11-11 MED ORDER — ACETAMINOPHEN 500 MG PO TABS
1000.0000 mg | ORAL_TABLET | Freq: Once | ORAL | Status: DC
  Filled 2022-11-11: qty 2

## 2022-11-11 MED ORDER — HYDROXYZINE HCL 25 MG PO TABS
25.0000 mg | ORAL_TABLET | Freq: Once | ORAL | Status: AC
  Administered 2022-11-11: 25 mg via ORAL
  Filled 2022-11-11: qty 1

## 2022-11-11 NOTE — Discharge Instructions (Signed)
Please follow-up with your primary care provider for further evaluation and management of your medical symptoms.  You may return to the ER if your symptoms worsen

## 2022-11-11 NOTE — ED Provider Notes (Signed)
EMERGENCY DEPARTMENT AT [facility] Provider Note   CSN: [id number] Arrival date & time: 11/10/22  2312     History  Chief Complaint  Patient presents with   Chest Pain    Brad [name] is a 67 y.o. male.  The history is provided by the patient and medical records. No language interpreter was used.  Chest Pain    67 year old male with significant history of left inguinal hernia s/p repair in 2012, anxiety, mitral valve prolapse, hypertension brought here via EMS from home complaining of diffuse pain.  Please note patient has been seen evaluated for these complaint in the past 2 days.  He was seen yesterday with complaints of nonspecific chest pain with an unremarkable workup.  He was seen the day before for pain at his left inguinal hernia with reassuring workup as well.  This is also the patient's 23rd ER visit this year.  At this time patient states he is having trouble with neck pain and trouble swallowing.  States that he is having shortness of breath because of his neck pain.  That started approximately 1 hour ago while he was in the waiting room.  Furthermore he also complaining of pain to his left inguinal hernia.  This is an ongoing problem.  Patient also complaining of rectal pain.  States he has not had a bowel movement in the past several days.  Patient endorsed chest pain with his shortness of breath.  He denies any recent medication changes.  Home Medications Prior to Admission medications   Medication Sig Start Date End Date Taking? Authorizing Provider  Alpha-Lipoic Acid 600 MG CAPS Take 600 mg by mouth daily.    [provider]  amLODipine (NORVASC) 5 MG tablet Take 2 tablets (10 mg total) by mouth daily. 03/02/19   Johnn Hai, MD  atorvastatin (LIPITOR) 10 MG tablet Take 10 mg by mouth 3 (three) times a week. MWF 08/10/19   [provider]  busPIRone (BUSPAR) 10 MG tablet Take 20 mg by mouth 3 (three) times daily.     [provider]  Cholecalciferol (VITAMIN D3) 125 MCG (5000 UT) TABS Take 5,000 Units by mouth daily.    [provider]  Coenzyme Q10 50 MG CAPS Take 50 mg by mouth daily.    [provider]  cyclobenzaprine (FLEXERIL) 10 MG tablet Take 1 tablet (10 mg total) by mouth 2 (two) times daily as needed for muscle spasms. 11/06/22   Azucena Cecil, PA-C  docusate sodium (COLACE) 100 MG capsule Take 1 capsule (100 mg total) by mouth every 12 (twelve) hours. 09/26/22   Valarie Merino, MD  LORazepam (ATIVAN) 1 MG tablet Take 1 mg by mouth 2 (two) times daily. 10/26/18   [provider]  losartan (COZAAR) 100 MG tablet Take 100 mg by mouth daily. 02/04/20   [provider]  mirtazapine (REMERON) 45 MG tablet Take 1 tablet (45 mg total) by mouth at bedtime. 10/21/22   Raiford Noble Latif, DO  Multiple Vitamin (MULTIVITAMIN) tablet Take 1 tablet by mouth daily.    [provider]  Omega-3 1000 MG CAPS Take 1,000 mg by mouth daily.    [provider]  polyethylene glycol (MIRALAX) 17 g packet Take 17 g by mouth 2 (two) times daily. 10/21/22   Raiford Noble Latif, DO  pregabalin (LYRICA) 75 MG capsule Take 75 mg by mouth 2 (two) times daily.    [provider]  senna-docusate (SENOKOT-S)  8.6-50 MG tablet Take 1 tablet by mouth at bedtime as needed for mild constipation. 10/13/22   Long, Wonda Olds, MD  traZODone (DESYREL) 50 MG tablet Take 50-100 mg by mouth at bedtime as needed for sleep.    [provider]  zaleplon (SONATA) 10 MG capsule Take 10-20 mg by mouth at bedtime as needed for sleep.    [provider]  zinc gluconate 50 MG tablet Take 50 mg by mouth daily.    [provider]      Allergies    Ciprofloxacin, Citalopram, Duloxetine, Duloxetine hcl, Fluticasone-umeclidin-vilant, Gabapentin, and Oxcarbazepine    Review of Systems   Review of Systems  Cardiovascular:  Positive for chest pain.  All other systems  reviewed and are negative.   Physical Exam Updated Vital Signs BP (!) 120/92 (BP Location: Left Arm)   Pulse 86   Temp 98.1 F (36.7 C) (Oral)   Resp (!) 22   SpO2 100% Comment: Simultaneous filing. User may not have seen previous data. Physical Exam Vitals and nursing note reviewed.  Constitutional:      Appearance: He is well-developed.     Comments: Patient appears anxious, however able to speak clearly with normal phonation.  HENT:     Head: Atraumatic.  Eyes:     Conjunctiva/sclera: Conjunctivae normal.  Cardiovascular:     Rate and Rhythm: Normal rate and regular rhythm.     Pulses: Normal pulses.     Heart sounds: Normal heart sounds.  Pulmonary:     Breath sounds: No wheezing, rhonchi or rales.     Comments: No wheezes Abdominal:     Palpations: Abdomen is soft.     Comments: Tenderness to left inguinal region at the previous hernia surgical site without any evidence of appreciable hernia.  No overlying skin changes.  Genitourinary:    Comments: Chaperone present during exam.  Patient has normal rectal tone, no obvious mass, soft stool noted in rectal vault. Musculoskeletal:        General: Normal range of motion.     Cervical back: Neck supple.  Skin:    Findings: No rash.  Neurological:     Mental Status: He is alert.     ED Results / Procedures / Treatments   Labs (all labs ordered are listed, but only abnormal results are displayed) Labs Reviewed - No data to display   EKG EKG Interpretation  Date/Time:  Wednesday November 10 2022 23:34:42 EDT Ventricular Rate:  83 PR Interval:  169 QRS Duration: 100 QT Interval:  389 QTC Calculation: 458 R Axis:   -64 Text Interpretation: Sinus rhythm Left anterior fascicular block RSR' in V1 or V2, right VCD or RVH Artifact Confirmed by Kolawole, Kimball [phone]) on 11/10/2022 11:38:54 PM  Radiology DG Chest 2 View  Result Date: 11/10/2022 CLINICAL DATA:  Provided history: Chest pain. Shortness of breath. EXAM: CHEST  - 2 VIEW COMPARISON:  Prior chest radiographs 11/09/2022 and earlier. FINDINGS: Mild cardiomegaly, unchanged. Aortic atherosclerosis. No appreciable airspace consolidation or pulmonary edema. No evidence of pleural effusion or pneumothorax. Dextrocurvature and spondylosis of the thoracic spine. ACDF hardware. IMPRESSION: 1. No evidence of acute cardiopulmonary abnormality. 2. Mild cardiomegaly. 3.  Aortic Atherosclerosis (ICD10-I70.0). Electronically Signed   By: Kellie Simmering D.O.   On: 11/10/2022 10:39   DG Abd Acute W/Chest  Result Date: 11/09/2022 CLINICAL DATA:  Inguinal hernia with pain radiating to testicles, shortness of breath EXAM: DG ABDOMEN ACUTE WITH 1 VIEW CHEST COMPARISON:  10/30/2022  FINDINGS: Supine and upright frontal views of the abdomen and pelvis as well as an upright frontal view of the chest are obtained. Cardiac silhouette is stable. No acute airspace disease, effusion, or pneumothorax. No bowel obstruction or ileus. Minimal stool within the distal colon. There are no masses or abnormal calcifications. No free gas in the greater peritoneal sac. Stable colonic interposition right upper quadrant. No acute bony abnormalities.  S shaped thoracolumbar scoliosis. IMPRESSION: 1. No acute intrathoracic process. 2. Unremarkable bowel gas pattern. Electronically Signed   By: Randa Ngo M.D.   On: 11/09/2022 18:03    Procedures Procedures    Medications Ordered in ED Medications  acetaminophen (TYLENOL) tablet 1,000 mg (has no administration in time range)  hydrOXYzine (ATARAX) tablet 25 mg (25 mg Oral Given 11/11/22 0749)    ED Course/ Medical Decision Making/ A&P                             Medical Decision Making Risk OTC drugs. Prescription drug management.   BP (!) 156/105   Pulse 65   Temp 98.1 F (36.7 C) (Oral)   Resp 17   SpO2 100%   15:36 AM  68 year old male with significant history of left inguinal hernia s/p repair in 2012, anxiety, mitral valve prolapse,  hypertension brought here via EMS from home complaining of diffuse pain.  Please note patient has been seen evaluated for these complaint in the past 2 days.  He was seen yesterday with complaints of nonspecific chest pain with an unremarkable workup.  He was seen the day before for pain at his left inguinal hernia with reassuring workup as well.  This is also the patient's 23rd ER visit this year.  At this time patient states he is having trouble with neck pain and trouble swallowing.  States that he is having shortness of breath because of his neck pain.  That started approximately 1 hour ago while he was in the waiting room.  Furthermore he also complaining of pain to his left inguinal hernia.  This is an ongoing problem.  Patient also complaining of rectal pain.  States he has not had a bowel movement in the past several days.  Patient endorsed chest pain with his shortness of breath.  He denies any recent medication changes.  On exam, patient is laying bed appears uncomfortable.  He is grabbing towards his neck indicating that he is having pain in his neck and having trouble breathing.  However, patient has normal phonation and oxygen at 100% on room air.  Heart with normal rate and rhythm, lungs clear to auscultation bilaterally without any wheezes abdomen with some tenderness to his left inguinal region at the site of a previous left inguinal hernia repair.  No evidence of appreciable hernia and no overlying skin changes.  Chaperone was present during rectal exam patient has normal rectal tone, he has soft stool in his rectal vault with normal color and no obvious mass.  He is moving all 4 extremities   -I have reviewed labs obtained in the past few days and it was independently viewed and interpreted by me.  Labs are overall reassuring.  -EKG interpreted by me showing NSR -imaging from yesterday independently viewed and interpreted by me and I agree with radiologist's interpretation.  Result  remarkable for no acute changes on CXR and acute abdominal xray -DDx: anxiety, chronic hernia pain, atypical chest pain, psychiatric undertone -treatment includes atarax and  tylenol -PCP office notes or outside notes reviewed -Discussion with attending Dr. Florina Ou -Escalation to admission/observation considered: pt does not meet criteria for admission -Prescription medication considered, patient comfortable with home medication -Social Determinant of Health considered   8:22 AM Patient requested to talk to me.  He voiced concern that he is having trouble breathing.  Patient has been on the monitor for the past several hours without any signs of hypoxia.  Patient does appears anxious and on reexamination, uvula is midline no tonsillar enlargement or exudates no signs of mucosal edema no tongue swelling no angioedema.  Neck is supple, trachea is midline, on reassessment lungs are clear to auscultation bilaterally.  I gave patient reassurance at this time no signs of airway compromise.  Consider intubation and tracheostomy but felt these emergent intervention is not indicated and they are more harm than benefit.  Expressed this with patient.  Strong encouraged patient to follow-up with her primary care provider for further managements of his symptoms.  I will provide resources as well.  Patient understand he certainly can return if symptoms worsen.        Final Clinical Impression(s) / ED Diagnoses Final diagnoses:  Atypical chest pain  Other form of dyspnea    Rx / DC Orders ED Discharge Orders     None         Domenic Moras, PA-C 11/11/22 0837    Shanon Rosser, MD 11/18/22 2245

## 2022-11-11 NOTE — ED Notes (Signed)
Refuses blood collection says he just had it done at [facility] and was tired of being poked.

## 2022-11-12 ENCOUNTER — Other Ambulatory Visit: Payer: Self-pay

## 2022-11-12 ENCOUNTER — Emergency Department (HOSPITAL_COMMUNITY): Payer: Medicare Other

## 2022-11-12 ENCOUNTER — Emergency Department (HOSPITAL_COMMUNITY)
Admission: EM | Admit: 2022-11-12 | Discharge: 2022-11-12 | Disposition: A | Discharge status: Home / Self Care | Payer: Medicare Other | Attending: Emergency Medicine | Admitting: Emergency Medicine

## 2022-11-12 ENCOUNTER — Encounter (HOSPITAL_COMMUNITY): Payer: Self-pay | Admitting: Emergency Medicine

## 2022-11-12 DIAGNOSIS — Z79899 Other long term (current) drug therapy: Secondary | ICD-10-CM | POA: Insufficient documentation

## 2022-11-12 DIAGNOSIS — F419 Anxiety disorder, unspecified: Secondary | ICD-10-CM | POA: Diagnosis present

## 2022-11-12 DIAGNOSIS — I10 Essential (primary) hypertension: Secondary | ICD-10-CM | POA: Insufficient documentation

## 2022-11-12 DIAGNOSIS — R079 Chest pain, unspecified: Secondary | ICD-10-CM | POA: Diagnosis not present

## 2022-11-12 LAB — TROPONIN I (HIGH SENSITIVITY): Troponin I (High Sensitivity): 12 ng/L (ref <18)

## 2022-11-12 LAB — CBC WITH DIFFERENTIAL/PLATELET
Abs Immature Granulocytes: 0.02 10*3/uL (ref 0.00–0.07)
Basophils Absolute: 0 10*3/uL (ref 0.0–0.1)
Basophils Relative: 1 %
Eosinophils Absolute: 0 10*3/uL (ref 0.0–0.5)
Eosinophils Relative: 0 %
HCT: 42.1 % (ref 39.0–52.0)
Hemoglobin: 14.8 g/dL (ref 13.0–17.0)
Immature Granulocytes: 0 %
Lymphocytes Relative: 14 %
Lymphs Abs: 1.1 10*3/uL (ref 0.7–4.0)
MCH: 31.6 pg (ref 26.0–34.0)
MCHC: 35.2 g/dL (ref 30.0–36.0)
MCV: 89.8 fL (ref 80.0–100.0)
Monocytes Absolute: 0.7 10*3/uL (ref 0.1–1.0)
Monocytes Relative: 9 %
Neutro Abs: 5.9 10*3/uL (ref 1.7–7.7)
Neutrophils Relative %: 76 %
Platelets: 219 10*3/uL (ref 150–400)
RBC: 4.69 MIL/uL (ref 4.22–5.81)
RDW: 12.9 % (ref 11.5–15.5)
WBC: 7.7 10*3/uL (ref 4.0–10.5)
nRBC: 0 % (ref 0.0–0.2)

## 2022-11-12 LAB — BASIC METABOLIC PANEL
Anion gap: 9 (ref 5–15)
BUN: 18 mg/dL (ref 8–23)
CO2: 25 mmol/L (ref 22–32)
Calcium: 9.8 mg/dL (ref 8.9–10.3)
Chloride: 98 mmol/L (ref 98–111)
Creatinine, Ser: 0.81 mg/dL (ref 0.61–1.24)
GFR, Estimated: >60 mL/min (ref >60)
Glucose, Bld: 121 mg/dL — ABNORMAL HIGH (ref 70–99)
Potassium: 3.8 mmol/L (ref 3.5–5.1)
Sodium: 132 mmol/L — ABNORMAL LOW (ref 135–145)

## 2022-11-12 MED ORDER — LORAZEPAM 2 MG/ML IJ SOLN
1.0000 mg | Freq: Once | INTRAMUSCULAR | Status: DC
  Filled 2022-11-12: qty 1

## 2022-11-12 MED ORDER — LORAZEPAM 2 MG/ML IJ SOLN
1.0000 mg | Freq: Once | INTRAMUSCULAR | Status: AC
  Administered 2022-11-12: 1 mg via INTRAVENOUS

## 2022-11-12 NOTE — ED Triage Notes (Signed)
Per [facility] pt c/o chest wall pain and anxiety. States he did not take his home meds this am.

## 2022-11-12 NOTE — ED Provider Notes (Signed)
[facility] Provider Note   CSN: [id number] Arrival date & time: 11/12/22  1454     History  Chief Complaint  Patient presents with   Anxiety    Brad [name] is a 67 y.o. male history of hypertension,reflux, HTN, malingering here with chest pain, anxiety.  Patient states that he does not feel well.  Patient has multiple ED visits for the same thing.  He was most recently here yesterday.  His heart enzyme tests were in negative yesterday.  Patient had more than 20 visits this year for similar complaints.  The history is provided by the patient.       Home Medications Prior to Admission medications   Medication Sig Start Date End Date Taking? Authorizing Provider  Alpha-Lipoic Acid 600 MG CAPS Take 600 mg by mouth daily.    [provider]  amLODipine (NORVASC) 5 MG tablet Take 2 tablets (10 mg total) by mouth daily. 03/02/19   Johnn Hai, MD  atorvastatin (LIPITOR) 10 MG tablet Take 10 mg by mouth 3 (three) times a week. MWF 08/10/19   [provider]  busPIRone (BUSPAR) 10 MG tablet Take 20 mg by mouth 3 (three) times daily.     [provider]  Cholecalciferol (VITAMIN D3) 125 MCG (5000 UT) TABS Take 5,000 Units by mouth daily.    [provider]  Coenzyme Q10 50 MG CAPS Take 50 mg by mouth daily.    [provider]  cyclobenzaprine (FLEXERIL) 10 MG tablet Take 1 tablet (10 mg total) by mouth 2 (two) times daily as needed for muscle spasms. 11/06/22   Azucena Cecil, PA-C  docusate sodium (COLACE) 100 MG capsule Take 1 capsule (100 mg total) by mouth every 12 (twelve) hours. 09/26/22   Valarie Merino, MD  LORazepam (ATIVAN) 1 MG tablet Take 1 mg by mouth 2 (two) times daily. 10/26/18   [provider]  losartan (COZAAR) 100 MG tablet Take 100 mg by mouth daily. 02/04/20   [provider]  mirtazapine (REMERON) 45 MG tablet Take 1 tablet (45 mg total) by mouth at bedtime.  10/21/22   Raiford Noble Latif, DO  Multiple Vitamin (MULTIVITAMIN) tablet Take 1 tablet by mouth daily.    [provider]  Omega-3 1000 MG CAPS Take 1,000 mg by mouth daily.    [provider]  polyethylene glycol (MIRALAX) 17 g packet Take 17 g by mouth 2 (two) times daily. 10/21/22   Raiford Noble Latif, DO  pregabalin (LYRICA) 75 MG capsule Take 75 mg by mouth 2 (two) times daily.    [provider]  senna-docusate (SENOKOT-S) 8.6-50 MG tablet Take 1 tablet by mouth at bedtime as needed for mild constipation. 10/13/22   Long, Wonda Olds, MD  traZODone (DESYREL) 50 MG tablet Take 50-100 mg by mouth at bedtime as needed for sleep.    [provider]  zaleplon (SONATA) 10 MG capsule Take 10-20 mg by mouth at bedtime as needed for sleep.    [provider]  zinc gluconate 50 MG tablet Take 50 mg by mouth daily.    [provider]      Allergies    Ciprofloxacin, Citalopram, Duloxetine, Duloxetine hcl, Fluticasone-umeclidin-vilant, Gabapentin, and Oxcarbazepine    Review of Systems   Review of Systems  Psychiatric/Behavioral:  The patient is nervous/anxious.   All other systems reviewed and are negative.   Physical Exam Updated Vital Signs BP (!) 165/104  Pulse 99   Temp 97.9 F (36.6 C) (Oral)   Resp 16   SpO2 100%  Physical Exam Vitals and nursing note reviewed.  Constitutional:      Comments: Anxious  HENT:     Head: Normocephalic.     Nose: Nose normal.     Mouth/Throat:     Mouth: Mucous membranes are moist.  Eyes:     Extraocular Movements: Extraocular movements intact.     Pupils: Pupils are equal, round, and reactive to light.  Cardiovascular:     Rate and Rhythm: Normal rate and regular rhythm.     Pulses: Normal pulses.     Heart sounds: Normal heart sounds.  Pulmonary:     Effort: Pulmonary effort is normal.     Breath sounds: Normal breath sounds.  Abdominal:     General: Abdomen is flat.     Palpations:  Abdomen is soft.  Musculoskeletal:        General: Normal range of motion.     Cervical back: Normal range of motion and neck supple.  Skin:    General: Skin is warm.     Capillary Refill: Capillary refill takes less than 2 seconds.  Neurological:     General: No focal deficit present.     Mental Status: He is alert and oriented to person, place, and time.  Psychiatric:        Behavior: Behavior normal.     ED Results / Procedures / Treatments   Labs (all labs ordered are listed, but only abnormal results are displayed) Labs Reviewed  CBC WITH DIFFERENTIAL/PLATELET  BASIC METABOLIC PANEL  TROPONIN I (HIGH SENSITIVITY)    EKG None  Radiology DG Chest 1 View  Result Date: 11/12/2022 CLINICAL DATA:  Anxiety EXAM: CHEST  1 VIEW COMPARISON:  11/10/2022 FINDINGS: Single frontal view of the chest demonstrates a stable enlarged cardiac silhouette. No airspace disease, effusion, or pneumothorax. No acute bony abnormalities. Colonic interposition right upper quadrant. IMPRESSION: 1. Stable chest, no acute process. Electronically Signed   By: Randa Ngo M.D.   On: 11/12/2022 15:50    Procedures Procedures    Medications Ordered in ED Medications  LORazepam (ATIVAN) injection 1 mg (has no administration in time range)    ED Course/ Medical Decision Making/ A&P                             Medical Decision Making Brad [name] is a 67 y.o. male here presenting with anxiety and chest pain.  This is a very recurrent problem.  Patient has been seen multiple times for this problem.  Chest x-ray and labs were ordered in triage.  Patient appears very anxious so we will give Ativan.  Will reassess.   4:35 PM Patient is more calm now.  Labs including troponin unremarkable.  Patient continues to tell me that nobody took his blood.  I reassured him that we check blood test and x-ray.  I told him that he is stable for discharge  Problems Addressed: Anxiety: chronic illness or  injury Chest pain, unspecified type: chronic illness or injury  Amount and/or Complexity of Data Reviewed Labs: ordered. Decision-making details documented in ED Course. Radiology: ordered and independent interpretation performed. Decision-making details documented in ED Course. ECG/medicine tests: ordered and independent interpretation performed. Decision-making details documented in ED Course.  Risk Prescription drug management.    Final Clinical Impression(s) / ED Diagnoses Final diagnoses:  None  Rx / DC Orders ED Discharge Orders     None         Drenda Freeze, MD 11/12/22 [phone]

## 2022-11-12 NOTE — Discharge Instructions (Signed)
As we discussed, your lab work today was normal and your x-ray was normal.  Please continue to take your medicines as prescribed by your doctor  See your doctor for follow-up  Return to ER if you have worse chest pain or shortness of breath

## 2022-11-12 NOTE — ED Provider Triage Note (Signed)
Emergency Medicine Provider Triage Evaluation Note  Brad [name] , a 67 y.o. male  was evaluated in triage.  Pt complains of chest pain since last night. Feels like everything is tingling. Worse with palpation, no n/v  Review of Systems  Per hpi  Physical Exam  BP (!) 165/104   Pulse 99   Temp 97.9 F (36.6 C) (Oral)   Resp 16   SpO2 100%  Gen:   Awake, no distress   Resp:  Normal effort  MSK:   Moves extremities without difficulty  Other:    Medical Decision Making  Medically screening exam initiated at 3:08 PM.  Appropriate orders placed.  Brad [name] was informed that the remainder of the evaluation will be completed by another provider, this initial triage assessment does not replace that evaluation, and the importance of remaining in the ED until their evaluation is complete.     Sherrill Raring, PA-C 11/12/22 [phone]

## 2022-11-12 NOTE — ED Notes (Signed)
Patient transported to X-ray 

## 2022-11-14 ENCOUNTER — Other Ambulatory Visit: Payer: Self-pay

## 2022-11-14 ENCOUNTER — Emergency Department (HOSPITAL_COMMUNITY)
Admission: EM | Admit: 2022-11-14 | Discharge: 2022-11-14 | Disposition: A | Discharge status: Home / Self Care | Payer: Medicare Other | Source: Home / Self Care | Attending: Emergency Medicine | Admitting: Emergency Medicine

## 2022-11-14 ENCOUNTER — Emergency Department (HOSPITAL_COMMUNITY)
Admission: EM | Admit: 2022-11-14 | Discharge: 2022-11-14 | Disposition: A | Discharge status: Home / Self Care | Payer: Medicare Other | Attending: Emergency Medicine | Admitting: Emergency Medicine

## 2022-11-14 DIAGNOSIS — G8929 Other chronic pain: Secondary | ICD-10-CM | POA: Insufficient documentation

## 2022-11-14 DIAGNOSIS — F419 Anxiety disorder, unspecified: Secondary | ICD-10-CM | POA: Diagnosis not present

## 2022-11-14 DIAGNOSIS — M25561 Pain in right knee: Secondary | ICD-10-CM | POA: Insufficient documentation

## 2022-11-14 DIAGNOSIS — Z79899 Other long term (current) drug therapy: Secondary | ICD-10-CM | POA: Insufficient documentation

## 2022-11-14 DIAGNOSIS — I10 Essential (primary) hypertension: Secondary | ICD-10-CM | POA: Insufficient documentation

## 2022-11-14 DIAGNOSIS — R0789 Other chest pain: Secondary | ICD-10-CM | POA: Insufficient documentation

## 2022-11-14 DIAGNOSIS — R079 Chest pain, unspecified: Secondary | ICD-10-CM | POA: Diagnosis present

## 2022-11-14 MED ORDER — KETOROLAC TROMETHAMINE 15 MG/ML IJ SOLN
15.0000 mg | Freq: Once | INTRAMUSCULAR | Status: AC
  Administered 2022-11-14: 15 mg via INTRAMUSCULAR
  Filled 2022-11-14: qty 1

## 2022-11-14 MED ORDER — FAMOTIDINE 20 MG PO TABS
20.0000 mg | ORAL_TABLET | Freq: Once | ORAL | Status: AC
  Administered 2022-11-14: 20 mg via ORAL
  Filled 2022-11-14: qty 1

## 2022-11-14 MED ORDER — HYDROXYZINE HCL 25 MG PO TABS: 25.0000 mg | ORAL_TABLET | Freq: Three times a day (TID) | ORAL | 0 refills | Status: DC | PRN

## 2022-11-14 MED ORDER — ACETAMINOPHEN 500 MG PO TABS
1000.0000 mg | ORAL_TABLET | Freq: Once | ORAL | Status: AC
  Administered 2022-11-14: 1000 mg via ORAL
  Filled 2022-11-14: qty 2

## 2022-11-14 MED ORDER — LORAZEPAM 1 MG PO TABS
1.0000 mg | ORAL_TABLET | Freq: Once | ORAL | Status: AC
  Administered 2022-11-14: 1 mg via ORAL
  Filled 2022-11-14: qty 1

## 2022-11-14 MED ORDER — ALUM & MAG HYDROXIDE-SIMETH 200-200-20 MG/5ML PO SUSP
30.0000 mL | Freq: Once | ORAL | Status: AC
  Administered 2022-11-14: 30 mL via ORAL
  Filled 2022-11-14: qty 30

## 2022-11-14 MED ORDER — KETOROLAC TROMETHAMINE 10 MG PO TABS
10.0000 mg | ORAL_TABLET | Freq: Once | ORAL | Status: DC
  Filled 2022-11-14: qty 1

## 2022-11-14 NOTE — ED Provider Notes (Signed)
[place] EMERGENCY DEPARTMENT AT [facility] Provider Note   CSN: [id number] Arrival date & time: 11/14/22  2006     History  Chief Complaint  Patient presents with   Knee Pain    Brad [name] is a 67 y.o. male.  Patient presents the emergency department complaining of right-sided knee pain.  Patient states this pain is due to an injury that occurred approximately 2 years ago.  He has been evaluated twice this month for the same complaint to the emergency department.  He was evaluated earlier today at the emergency department due to chest pain and anxiety.  The patient rates the pain as severe and is "all over" the right knee.  Past medical history is significant for generalized anxiety disorder, major depressive disorder, neuropathic pain, hypertension, somatic complaints  HPI     Home Medications Prior to Admission medications   Medication Sig Start Date End Date Taking? Authorizing Provider  Alpha-Lipoic Acid 600 MG CAPS Take 600 mg by mouth daily.    [provider]  amLODipine (NORVASC) 5 MG tablet Take 2 tablets (10 mg total) by mouth daily. 03/02/19   Malvin Johns, MD  atorvastatin (LIPITOR) 10 MG tablet Take 10 mg by mouth 3 (three) times a week. MWF 08/10/19   [provider]  busPIRone (BUSPAR) 10 MG tablet Take 20 mg by mouth 3 (three) times daily.     [provider]  Cholecalciferol (VITAMIN D3) 125 MCG (5000 UT) TABS Take 5,000 Units by mouth daily.    [provider]  Coenzyme Q10 50 MG CAPS Take 50 mg by mouth daily.    [provider]  cyclobenzaprine (FLEXERIL) 10 MG tablet Take 1 tablet (10 mg total) by mouth 2 (two) times daily as needed for muscle spasms. 11/06/22   Al Decant, PA-C  docusate sodium (COLACE) 100 MG capsule Take 1 capsule (100 mg total) by mouth every 12 (twelve) hours. 09/26/22   Wynetta Fines, MD  hydrOXYzine (ATARAX) 25 MG tablet Take 1 tablet (25 mg total) by mouth every 8 (eight)  hours as needed. 11/14/22   Cathren Laine, MD  LORazepam (ATIVAN) 1 MG tablet Take 1 mg by mouth 2 (two) times daily. 10/26/18   [provider]  losartan (COZAAR) 100 MG tablet Take 100 mg by mouth daily. 02/04/20   [provider]  mirtazapine (REMERON) 45 MG tablet Take 1 tablet (45 mg total) by mouth at bedtime. 10/21/22   Marguerita Merles Latif, DO  Multiple Vitamin (MULTIVITAMIN) tablet Take 1 tablet by mouth daily.    [provider]  Omega-3 1000 MG CAPS Take 1,000 mg by mouth daily.    [provider]  polyethylene glycol (MIRALAX) 17 g packet Take 17 g by mouth 2 (two) times daily. 10/21/22   Marguerita Merles Latif, DO  pregabalin (LYRICA) 75 MG capsule Take 75 mg by mouth 2 (two) times daily.    [provider]  senna-docusate (SENOKOT-S) 8.6-50 MG tablet Take 1 tablet by mouth at bedtime as needed for mild constipation. 10/13/22   Long, Arlyss Repress, MD  traZODone (DESYREL) 50 MG tablet Take 50-100 mg by mouth at bedtime as needed for sleep.    [provider]  zaleplon (SONATA) 10 MG capsule Take 10-20 mg by mouth at bedtime as needed for sleep.    [provider]  zinc gluconate 50 MG tablet Take 50 mg by mouth daily.    [provider]  Allergies    Ciprofloxacin, Citalopram, Duloxetine, Duloxetine hcl, Fluticasone-umeclidin-vilant, Gabapentin, and Oxcarbazepine    Review of Systems   Review of Systems  Musculoskeletal:  Positive for arthralgias.    Physical Exam Updated Vital Signs BP (!) 149/106 (BP Location: Right Arm)   Pulse 77   Temp 98.2 F (36.8 C) (Oral)   Resp 18   Ht 5\' 9"  (1.753 m)   Wt 56.7 kg   SpO2 100%   BMI 18.46 kg/m  Physical Exam HENT:     Head: Normocephalic and atraumatic.  Eyes:     Conjunctiva/sclera: Conjunctivae normal.  Cardiovascular:     Rate and Rhythm: Normal rate.  Pulmonary:     Effort: Pulmonary effort is normal. No respiratory distress.  Musculoskeletal:         General: No swelling or deformity.     Cervical back: Normal range of motion.     Comments: Ambulatory, patient neurovascularly intact distal to the right knee  Skin:    General: Skin is dry.  Neurological:     Mental Status: He is alert.  Psychiatric:        Speech: Speech normal.        Behavior: Behavior normal.     ED Results / Procedures / Treatments   Labs (all labs ordered are listed, but only abnormal results are displayed) Labs Reviewed - No data to display  EKG None  Radiology No results found.  Procedures Procedures    Medications Ordered in ED Medications  ketorolac (TORADOL) 15 MG/ML injection 15 mg (has no administration in time range)    ED Course/ Medical Decision Making/ A&P                             Medical Decision Making  Patient presented with a chief complaint of right knee pain.  Patient's third visit within the past 15 days for the same complaint.  This is the patient's 16th emergency department visit within the past 30 days.  The patient has had outpatient imaging of the right knee including MRI performed at the orthopedic office in February of this year.  Notes from their office show consideration of possible meniscal injury prior to the MRI, suggestions for possible knee brace which patient declined.  Today patient was discharged after being evaluated for chest pain and anxiety and remained in the lobby.  After a few hours he checked back into complaint of this right-sided knee pain.  He was ambulatory at the time of check-in.  There is no warmth or erythema to the joint.  There is no value to repeat imaging as the patient has had advanced imaging as an outpatient with orthopedics.  Very low suspicion of a septic joint.  No risk factors for osteomyelitis, osteonecrosis.  This seems to be chronic in nature.  Patient given oral Toradol and discharged home with recommendation for follow-up with orthopedics.  Prior to discharge patient motioned toward  his throat, mouthing "I can't breathe". Patient verbalized "don't hurt me" when the nurse went to administer the toradol. Patient is moving air. He earlier stated he couldn't breathe when his ride came to take him home after his earlier visit but his initial complaint upon this visit is the knee pain. He was given a piece of paper since he couldn't speak but did not write out his complaints. Feel there is an element of malingering. No signs of acute respiratory distress, lungs clear to  auscultation, SpO2 100%, vitals stable.         Final Clinical Impression(s) / ED Diagnoses Final diagnoses:  Chronic pain of right knee    Rx / DC Orders ED Discharge Orders     None         Pamala Duffel 11/14/22 2148    Bethann Berkshire, MD 11/16/22 [id number]

## 2022-11-14 NOTE — ED Notes (Signed)
ED Provider at bedside. 

## 2022-11-14 NOTE — Discharge Instructions (Addendum)
You were evaluated today for pain of the right knee.  Please follow-up with orthopedics for results of your right knee MRI and further recommendations as needed.  Please take over-the-counter medications such as ibuprofen and Tylenol for continued pain relief.

## 2022-11-14 NOTE — Discharge Instructions (Addendum)
It was our pleasure to provide your ER care today - we hope that you feel better.  Take acetaminophen as need. If gi symptoms, may try pepcid and/or maalox as need.   Follow up with primary care doctor in the coming week.  For stress/anxiety - see attached information.  You may try taking vistaril as need, as prescribed, for symptom relief. Follow up closely with behavioral health provider in the next 1-2 weeks - see resource guide provided.   For mental health issues and/or crisis, you may also go the [facility], it is open 24/7 and walk ins are welcome.   Return to ER if worse, new symptoms, fevers, recurrent/persistent chest pain, increased trouble breathing, or other emergency concern.

## 2022-11-14 NOTE — ED Triage Notes (Signed)
Pt with right knee pain from previous injury that occurred  reportedly 2 years ago.

## 2022-11-14 NOTE — ED Triage Notes (Signed)
Patient BIB EMS for chest pain, SOB, headache and body aches started last night per patient. Refused IV per EMS.  EMS VS: 180/110, 80, 99% RA

## 2022-11-14 NOTE — ED Provider Notes (Signed)
[facility] Provider Note   CSN: [id number] Arrival date & time: 11/14/22  1159     History  Chief Complaint  Patient presents with   Chest Pain    Patient BIB EMS for chest pain, SOB, headache and body aches.     Brad [name] is a 67 y.o. male.  Pt with hx htn, anxiety, c/o midline, lower chest pain for past several days, at rest, constant, worse w palpation and certain movement changes. No exertional chest pain or discomfort. No sob or unusual doe. No abd pain or nv. No heartburn. No cough or fever. No neck or back pain. No leg pain or swelling.   The history is provided by the patient, medical records and the EMS personnel.  Chest Pain Associated symptoms: no abdominal pain, no back pain, no fever, no headache, no palpitations, no shortness of breath and no vomiting        Home Medications Prior to Admission medications   Medication Sig Start Date End Date Taking? Authorizing Provider  Alpha-Lipoic Acid 600 MG CAPS Take 600 mg by mouth daily.    [provider]  amLODipine (NORVASC) 5 MG tablet Take 2 tablets (10 mg total) by mouth daily. 03/02/19   Johnn Hai, MD  atorvastatin (LIPITOR) 10 MG tablet Take 10 mg by mouth 3 (three) times a week. MWF 08/10/19   [provider]  busPIRone (BUSPAR) 10 MG tablet Take 20 mg by mouth 3 (three) times daily.     [provider]  Cholecalciferol (VITAMIN D3) 125 MCG (5000 UT) TABS Take 5,000 Units by mouth daily.    [provider]  Coenzyme Q10 50 MG CAPS Take 50 mg by mouth daily.    [provider]  cyclobenzaprine (FLEXERIL) 10 MG tablet Take 1 tablet (10 mg total) by mouth 2 (two) times daily as needed for muscle spasms. 11/06/22   Azucena Cecil, PA-C  docusate sodium (COLACE) 100 MG capsule Take 1 capsule (100 mg total) by mouth every 12 (twelve) hours. 09/26/22   Valarie Merino, MD  LORazepam (ATIVAN) 1 MG tablet Take 1 mg by mouth 2  (two) times daily. 10/26/18   [provider]  losartan (COZAAR) 100 MG tablet Take 100 mg by mouth daily. 02/04/20   [provider]  mirtazapine (REMERON) 45 MG tablet Take 1 tablet (45 mg total) by mouth at bedtime. 10/21/22   Raiford Noble Latif, DO  Multiple Vitamin (MULTIVITAMIN) tablet Take 1 tablet by mouth daily.    [provider]  Omega-3 1000 MG CAPS Take 1,000 mg by mouth daily.    [provider]  polyethylene glycol (MIRALAX) 17 g packet Take 17 g by mouth 2 (two) times daily. 10/21/22   Raiford Noble Latif, DO  pregabalin (LYRICA) 75 MG capsule Take 75 mg by mouth 2 (two) times daily.    [provider]  senna-docusate (SENOKOT-S) 8.6-50 MG tablet Take 1 tablet by mouth at bedtime as needed for mild constipation. 10/13/22   Long, Wonda Olds, MD  traZODone (DESYREL) 50 MG tablet Take 50-100 mg by mouth at bedtime as needed for sleep.    [provider]  zaleplon (SONATA) 10 MG capsule Take 10-20 mg by mouth at bedtime as needed for sleep.    [provider]  zinc gluconate 50 MG tablet Take 50 mg by mouth daily.    [provider]      Allergies  Ciprofloxacin, Citalopram, Duloxetine, Duloxetine hcl, Fluticasone-umeclidin-vilant, Gabapentin, and Oxcarbazepine    Review of Systems   Review of Systems  Constitutional:  Negative for fever.  HENT:  Negative for sore throat.   Eyes:  Negative for redness.  Respiratory:  Negative for shortness of breath.   Cardiovascular:  Positive for chest pain. Negative for palpitations and leg swelling.  Gastrointestinal:  Negative for abdominal pain, diarrhea and vomiting.  Genitourinary:  Negative for dysuria and flank pain.  Musculoskeletal:  Negative for back pain and neck pain.  Skin:  Negative for rash.  Neurological:  Negative for headaches.  Hematological:  Does not bruise/bleed easily.  Psychiatric/Behavioral:  The patient is nervous/anxious.     Physical  Exam Updated Vital Signs BP 132/83 (BP Location: Left Arm)   Pulse 75   Temp 98.3 F (36.8 C) (Oral)   Resp 18   Ht 1.753 m (5\' 9" )   Wt 56.7 kg   SpO2 98%   BMI 18.46 kg/m  Physical Exam Vitals and nursing note reviewed.  Constitutional:      Appearance: Normal appearance. He is well-developed.  HENT:     Head: Atraumatic.     Nose: Nose normal.     Mouth/Throat:     Mouth: Mucous membranes are moist.  Eyes:     General: No scleral icterus.    Conjunctiva/sclera: Conjunctivae normal.  Neck:     Trachea: No tracheal deviation.  Cardiovascular:     Rate and Rhythm: Normal rate and regular rhythm.     Pulses: Normal pulses.     Heart sounds: Normal heart sounds. No murmur heard.    No friction rub. No gallop.  Pulmonary:     Effort: Pulmonary effort is normal. No accessory muscle usage or respiratory distress.     Breath sounds: Normal breath sounds.  Chest:     Chest wall: Tenderness present.  Abdominal:     General: Bowel sounds are normal. There is no distension.     Palpations: Abdomen is soft.     Tenderness: There is no abdominal tenderness.  Genitourinary:    Comments: No cva tenderness. Musculoskeletal:        General: No swelling.     Cervical back: Normal range of motion and neck supple. No rigidity.     Right lower leg: No edema.     Left lower leg: No edema.  Skin:    General: Skin is warm and dry.     Findings: No rash.  Neurological:     Mental Status: He is alert.     Comments: Alert, speech clear. Motor/sens grossly intact.   Psychiatric:     Comments: Anxious appearing.      ED Results / Procedures / Treatments   Labs (all labs ordered are listed, but only abnormal results are displayed) Results for orders placed or performed during the hospital encounter of [id number]  Basic metabolic panel  Result Value Ref Range   Sodium 132 (L) 135 - 145 mmol/L   Potassium 3.8 3.5 - 5.1 mmol/L   Chloride 98 98 - 111 mmol/L   CO2 25 22 - 32 mmol/L    Glucose, Bld 121 (H) 70 - 99 mg/dL   BUN 18 8 - 23 mg/dL   Creatinine, Ser 0.81 0.61 - 1.24 mg/dL   Calcium 9.8 8.9 - 10.3 mg/dL   GFR, Estimated >60 >60 mL/min   Anion gap 9 5 - 15  CBC with Differential  Result Value Ref Range  WBC 7.7 4.0 - 10.5 K/uL   RBC 4.69 4.22 - 5.81 MIL/uL   Hemoglobin 14.8 13.0 - 17.0 g/dL   HCT 42.1 39.0 - 52.0 %   MCV 89.8 80.0 - 100.0 fL   MCH 31.6 26.0 - 34.0 pg   MCHC 35.2 30.0 - 36.0 g/dL   RDW 12.9 11.5 - 15.5 %   Platelets 219 150 - 400 K/uL   nRBC 0.0 0.0 - 0.2 %   Neutrophils Relative % 76 %   Neutro Abs 5.9 1.7 - 7.7 K/uL   Lymphocytes Relative 14 %   Lymphs Abs 1.1 0.7 - 4.0 K/uL   Monocytes Relative 9 %   Monocytes Absolute 0.7 0.1 - 1.0 K/uL   Eosinophils Relative 0 %   Eosinophils Absolute 0.0 0.0 - 0.5 K/uL   Basophils Relative 1 %   Basophils Absolute 0.0 0.0 - 0.1 K/uL   Immature Granulocytes 0 %   Abs Immature Granulocytes 0.02 0.00 - 0.07 K/uL  Troponin I (High Sensitivity)  Result Value Ref Range   Troponin I (High Sensitivity) 12 <18 ng/L   DG Chest 1 View  Result Date: 11/12/2022 CLINICAL DATA:  Anxiety EXAM: CHEST  1 VIEW COMPARISON:  11/10/2022 FINDINGS: Single frontal view of the chest demonstrates a stable enlarged cardiac silhouette. No airspace disease, effusion, or pneumothorax. No acute bony abnormalities. Colonic interposition right upper quadrant. IMPRESSION: 1. Stable chest, no acute process. Electronically Signed   By: Randa Ngo M.D.   On: 11/12/2022 15:50   DG Chest 2 View  Result Date: 11/10/2022 CLINICAL DATA:  Provided history: Chest pain. Shortness of breath. EXAM: CHEST - 2 VIEW COMPARISON:  Prior chest radiographs 11/09/2022 and earlier. FINDINGS: Mild cardiomegaly, unchanged. Aortic atherosclerosis. No appreciable airspace consolidation or pulmonary edema. No evidence of pleural effusion or pneumothorax. Dextrocurvature and spondylosis of the thoracic spine. ACDF hardware. IMPRESSION: 1. No evidence  of acute cardiopulmonary abnormality. 2. Mild cardiomegaly. 3.  Aortic Atherosclerosis (ICD10-I70.0). Electronically Signed   By: Kellie Simmering D.O.   On: 11/10/2022 10:39   DG Abd Acute W/Chest  Result Date: 11/09/2022 CLINICAL DATA:  Inguinal hernia with pain radiating to testicles, shortness of breath EXAM: DG ABDOMEN ACUTE WITH 1 VIEW CHEST COMPARISON:  10/30/2022 FINDINGS: Supine and upright frontal views of the abdomen and pelvis as well as an upright frontal view of the chest are obtained. Cardiac silhouette is stable. No acute airspace disease, effusion, or pneumothorax. No bowel obstruction or ileus. Minimal stool within the distal colon. There are no masses or abnormal calcifications. No free gas in the greater peritoneal sac. Stable colonic interposition right upper quadrant. No acute bony abnormalities.  S shaped thoracolumbar scoliosis. IMPRESSION: 1. No acute intrathoracic process. 2. Unremarkable bowel gas pattern. Electronically Signed   By: Randa Ngo M.D.   On: 11/09/2022 18:03   DG Cervical Spine Complete  Result Date: 11/06/2022 CLINICAL DATA:  Woke up with neck pain after sleeping wrong EXAM: CERVICAL SPINE - COMPLETE 5 VIEW COMPARISON:  None Available. FINDINGS: Cervical spinal fixation hardware appears intact. There is no evidence of cervical spine fracture or prevertebral soft tissue swelling. Alignment is normal. Degenerative changes above the level of the cervical fixation spanning C3-5 characterized by anterior disc osteophytes and intervertebral disc space narrowing. Right right-greater-than-left neural foraminal narrowing at C3-4. IMPRESSION: 1. Cervical spinal fixation hardware appears intact. 2. Degenerative changes above the level of the cervical fixation spanning C3-5. 3. No fracture or malalignment. Electronically Signed   By:  Darrin Nipper M.D.   On: 11/06/2022 18:10   DG Knee Complete 4 Views Right  Result Date: 10/30/2022 CLINICAL DATA:  Pain after injury EXAM: RIGHT  KNEE - COMPLETE 4 VIEW COMPARISON:  None Available. FINDINGS: No acute fracture or dislocation. Preserved joint spaces and bone mineralization. No joint effusion. There is an ill-defined sclerotic area involving the lateral femoral condyle. There is a differential but this could represent an underlying chondroid lesion versus bone infarct. Please correlate with any prior study. Otherwise additional workup with MRI when clinically appropriate IMPRESSION: 1. No acute osseous injury of the right knee. 2. Ill-defined sclerotic area involving the lateral femoral condyle. This has a differential including a chondroid lesion or bone infarct. Please correlate for any known history or prior imaging. Otherwise MRI can be performed when clinically appropriate Electronically Signed   By: Jill Side M.D.   On: 10/30/2022 19:30   DG Chest 2 View  Result Date: 10/30/2022 CLINICAL DATA:  Chest pain EXAM: CHEST - 2 VIEW COMPARISON:  Chest x-ray and CT angiogram chest 10/19/2022 FINDINGS: Hyperinflation. No consolidation, pneumothorax or effusion. No edema. Borderline cardiopericardial silhouette. Films are rotated to the left. Curvature and degenerative changes along the spine. Fixation hardware along the lower cervical spine at the edge of the imaging field. The extreme right lung apex is clipped off the edge of the film. IMPRESSION: Hyperinflation.  No acute cardiopulmonary disease. Of note the extreme right lung apex is clipped off the edge of the film. Additional evaluation as clinically appropriate Electronically Signed   By: Jill Side M.D.   On: 10/30/2022 19:28   VAS [place] UPPER EXTREMITY VENOUS DUPLEX  Result Date: 10/20/2022 UPPER VENOUS STUDY  Patient Name:  LACARLOS RESPASS [facility]  Date of Exam:   10/20/2022 Medical Rec #: [id number]    Accession #:    [id number] Date of Birth: 08-13-1956   Patient Gender: M Patient Age:   89 years Exam Location:  [facility] Procedure:      VAS [place] UPPER EXTREMITY VENOUS DUPLEX  Referring Phys: Raiford Noble --------------------------------------------------------------------------------  Indications: Pain and brusing Other Indications: IV infiltration. Limitations: Poor ultrasound/tissue interface and body habitus. Comparison Study: No previous exams Performing Technologist: Jody Hill RVT, RDMS  Examination Guidelines: A complete evaluation includes B-mode imaging, spectral Doppler, color Doppler, and power Doppler as needed of all accessible portions of each vessel. Bilateral testing is considered an integral part of a complete examination. Limited examinations for reoccurring indications may be performed as noted.  Right Findings: +----------+------------+---------+-----------+----------+-------------------+ RIGHT     CompressiblePhasicitySpontaneousProperties      Summary       +----------+------------+---------+-----------+----------+-------------------+ IJV           Full       Yes       Yes                                  +----------+------------+---------+-----------+----------+-------------------+ Subclavian    Full       Yes       Yes                                  +----------+------------+---------+-----------+----------+-------------------+ Axillary      Full       Yes       Yes                                  +----------+------------+---------+-----------+----------+-------------------+  Brachial      Full       Yes       Yes                                  +----------+------------+---------+-----------+----------+-------------------+ Radial        Full                                                      +----------+------------+---------+-----------+----------+-------------------+ Ulnar         Full                                                      +----------+------------+---------+-----------+----------+-------------------+ Cephalic      None       No        No                Age Indeterminate   +----------+------------+---------+-----------+----------+-------------------+ Basilic       Full                                  not well visualized +----------+------------+---------+-----------+----------+-------------------+  Left Findings: +----------+------------+---------+-----------+----------+-------+ LEFT      CompressiblePhasicitySpontaneousPropertiesSummary +----------+------------+---------+-----------+----------+-------+ Subclavian    Full       Yes       Yes                      +----------+------------+---------+-----------+----------+-------+  Summary:  Right: No evidence of deep vein thrombosis in the upper extremity. Findings consistent with age indeterminate superficial vein thrombosis involving the right cephalic vein.  Left: No evidence of thrombosis in the subclavian.  *See table(s) above for measurements and observations.  Diagnosing physician: Jamelle Haring Electronically signed by Jamelle Haring on 10/20/2022 at 10:06:13 PM.    Final    [place] RT UPPER EXTREM LTD SOFT TISSUE NON VASCULAR  Result Date: 10/20/2022 CLINICAL DATA:  Pain and bruising at injection sites of right proximal posterior forearm. EXAM: ULTRASOUND LEFT UPPER EXTREMITY LIMITED TECHNIQUE: Ultrasound examination of the upper extremity soft tissues was performed in the area of clinical concern. COMPARISON:  None Available. FINDINGS: Grayscale and color flow images performed of the area of interest of the posterior right forearm. No abnormal fluid collection or mass was seen. Comparison images were performed of the left posterior forearm without significant asymmetry. IMPRESSION: No abnormal fluid collection or mass was seen in the area of interest of the posterior right forearm. Electronically Signed   By: Yvonne Kendall M.D.   On: 10/20/2022 18:23   ECHOCARDIOGRAM COMPLETE  Result Date: 10/20/2022    ECHOCARDIOGRAM REPORT   Patient Name:   MARV BOCKUS [facility] Date of Exam: 10/20/2022 Medical Rec #:  [id number]    Height:       69.0 in Accession #:    [id number]  Weight:       132.3 lb Date of Birth:  Oct 01, 1955  BSA:          1.733 m Patient Age:    43 years    BP:  133/88 mmHg Patient Gender: M           HR:           71 bpm. Exam Location:  Inpatient Procedure: 2D Echo, Color Doppler and Cardiac Doppler Indications:    Chest pain  History:        Patient has no prior history of Echocardiogram examinations.                 Mitral Valve Prolapse; Risk Factors:Hypertension.  Sonographer:    Eartha Inch Referring Phys: [id number] LATIF [facility]  Sonographer Comments: Image acquisition challenging due to patient body habitus and Image acquisition challenging due to respiratory motion. IMPRESSIONS  1. Left ventricular ejection fraction, by estimation, is 60 to 65%. The left ventricle has normal function. The left ventricle has no regional wall motion abnormalities. Left ventricular diastolic parameters were normal.  2. Right ventricular systolic function is normal. The right ventricular size is normal.  3. Right atrial size was mildly dilated.  4. Mild bi leaflet prolapse with . The mitral valve is myxomatous. Trivial mitral valve regurgitation. No evidence of mitral stenosis.  5. The aortic valve is tricuspid. Aortic valve regurgitation is not visualized. No aortic stenosis is present.  6. The inferior vena cava is normal in size with greater than 50% respiratory variability, suggesting right atrial pressure of 3 mmHg. FINDINGS  Left Ventricle: Left ventricular ejection fraction, by estimation, is 60 to 65%. The left ventricle has normal function. The left ventricle has no regional wall motion abnormalities. The left ventricular internal cavity size was normal in size. There is  no left ventricular hypertrophy. Left ventricular diastolic parameters were normal. Right Ventricle: The right ventricular size is normal. No increase in right ventricular wall thickness. Right ventricular systolic function is normal.  Left Atrium: Left atrial size was normal in size. Right Atrium: Right atrial size was mildly dilated. Pericardium: There is no evidence of pericardial effusion. Mitral Valve: Mild bi leaflet prolapse with. The mitral valve is myxomatous. Trivial mitral valve regurgitation. No evidence of mitral valve stenosis. Tricuspid Valve: The tricuspid valve is normal in structure. Tricuspid valve regurgitation is mild . No evidence of tricuspid stenosis. Aortic Valve: The aortic valve is tricuspid. Aortic valve regurgitation is not visualized. No aortic stenosis is present. Pulmonic Valve: The pulmonic valve was normal in structure. Pulmonic valve regurgitation is not visualized. No evidence of pulmonic stenosis. Aorta: The aortic root is normal in size and structure. Venous: The inferior vena cava is normal in size with greater than 50% respiratory variability, suggesting right atrial pressure of 3 mmHg. IAS/Shunts: No atrial level shunt detected by color flow Doppler.  LEFT VENTRICLE PLAX 2D LVIDd:         5.00 cm   Diastology LVIDs:         3.20 cm   LV e' medial:  9.36 cm/s LV PW:         0.90 cm   LV e' lateral: 9.57 cm/s LV IVS:        0.70 cm LVOT diam:     2.10 cm LV SV:         69 LV SV Index:   40 LVOT Area:     3.46 cm  RIGHT VENTRICLE             IVC RV S prime:     11.20 cm/s  IVC diam: 0.80 cm TAPSE (M-mode): 2.0 cm LEFT ATRIUM  Index        RIGHT ATRIUM           Index LA diam:        3.50 cm 2.02 cm/m   RA Area:     27.40 cm LA Vol (A2C):   65.3 ml 37.68 ml/m  RA Volume:   99.20 ml  57.24 ml/m LA Vol (A4C):   46.1 ml 26.60 ml/m LA Biplane Vol: 57.1 ml 32.95 ml/m  AORTIC VALVE LVOT Vmax:   99.20 cm/s LVOT Vmean:  63.500 cm/s LVOT VTI:    0.198 m  AORTA Ao Root diam: 3.20 cm Ao Asc diam:  3.20 cm TRICUSPID VALVE TR Peak grad:   14.6 mmHg TR Vmax:        191.00 cm/s  SHUNTS Systemic VTI:  0.20 m Systemic Diam: 2.10 cm Jenkins Rouge MD Electronically signed by Jenkins Rouge MD Signature Date/Time:  10/20/2022/5:27:11 PM    Final    DG Abd 1 View  Result Date: 10/20/2022 CLINICAL DATA:  History of hernia repair with abdominal pain EXAM: ABDOMEN - 1 VIEW COMPARISON:  Abdominal radiograph dated 09/03/2022 FINDINGS: Nonobstructive bowel gas pattern. No free air or pneumatosis. Moderate volume stool throughout the colon. No abnormal radio-opaque calculi or mass effect. No acute or substantial osseous abnormality. The sacrum and coccyx are partially obscured by overlying bowel contents. Partially imaged lung bases are clear. IMPRESSION: Nonobstructive bowel gas pattern. Moderate volume stool throughout the colon. Electronically Signed   By: Darrin Nipper M.D.   On: 10/20/2022 14:56   CT Angio Chest PE W and/or Wo Contrast  Result Date: 10/20/2022 CLINICAL DATA:  Pulmonary embolism suspected, high probability. Elevated D-dimer. EXAM: CT ANGIOGRAPHY CHEST WITH CONTRAST TECHNIQUE: Multidetector CT imaging of the chest was performed using the standard protocol during bolus administration of intravenous contrast. Multiplanar CT image reconstructions and MIPs were obtained to evaluate the vascular anatomy. RADIATION DOSE REDUCTION: This exam was performed according to the departmental dose-optimization program which includes automated exposure control, adjustment of the mA and/or kV according to patient size and/or use of iterative reconstruction technique. CONTRAST:  63mL OMNIPAQUE IOHEXOL 350 MG/ML SOLN COMPARISON:  Portable chest yesterday, PA Lat 10/11/2022, CTA chest 08/26/2022 FINDINGS: Cardiovascular: Mild panchamber cardiomegaly is again noted. There is no venous dilatation. The pulmonary arteries are normal in caliber without visible emboli. There is aortic atherosclerosis and scattered calcification in the LAD and circumflex coronary arteries. The aortic root at the sinuses of Valsalva is slightly dilated at 4.0 cm. The remaining aorta is within normal caliber limits. No aortic dissection or stenosis is seen.  The great vessels are not opacified but are normal caliber. Mediastinum/Nodes: No enlarged mediastinal, hilar, or axillary lymph nodes. Thyroid gland, trachea, and esophagus demonstrate no significant findings. Lungs/Pleura: There is mild breathing motion artifact through the bases. No pulmonary infiltrate or nodule is seen. No pleural effusion or pneumothorax. Upper Abdomen: No acute abnormality. Musculoskeletal: There is mild thoracic kyphodextroscoliosis and degenerative changes. Multilevel lower cervical fusion plating is partially visible, extending to and including T1. Mild osteopenia. No suspicious bone lesions or chest wall abnormality. Review of the MIP images confirms the above findings. IMPRESSION: 1. No acute chest CT or CTA findings. 2. Aortic and coronary artery atherosclerosis. 3. Mild cardiomegaly. 4. Slightly dilated aortic root at the sinuses of Valsalva measuring 4 cm. Recommend annual imaging followup by CTA or MRA. This recommendation follows 2010 ACCF/AHA/AATS/ACR/ASA/SCA/SCAI/SIR/STS/SVM Guidelines for the Diagnosis and Management of Patients with Thoracic Aortic Disease. Circulation.  2010; [id number]. Aortic aneurysm NOS (ICD10-I71.9) 5. Kyphodextroscoliosis, degenerative and postsurgical changes and osteopenia. Electronically Signed   By: Telford Nab M.D.   On: 10/20/2022 00:38   DG CHEST PORT 1 VIEW  Result Date: 10/19/2022 CLINICAL DATA:  Chest pain EXAM: PORTABLE CHEST 1 VIEW COMPARISON:  10/11/2022 FINDINGS: No acute airspace disease or effusion. Normal cardiomediastinal silhouette. No pneumothorax. Air distended bowel beneath the right hemidiaphragm. IMPRESSION: No active disease. Air distended bowel beneath the right hemidiaphragm. Electronically Signed   By: Donavan Foil M.D.   On: 10/19/2022 18:06     EKG None  Radiology DG Chest 1 View  Result Date: 11/12/2022 CLINICAL DATA:  Anxiety EXAM: CHEST  1 VIEW COMPARISON:  11/10/2022 FINDINGS: Single frontal view of  the chest demonstrates a stable enlarged cardiac silhouette. No airspace disease, effusion, or pneumothorax. No acute bony abnormalities. Colonic interposition right upper quadrant. IMPRESSION: 1. Stable chest, no acute process. Electronically Signed   By: Randa Ngo M.D.   On: 11/12/2022 15:50    Procedures Procedures    Medications Ordered in ED Medications  LORazepam (ATIVAN) tablet 1 mg (has no administration in time range)  acetaminophen (TYLENOL) tablet 1,000 mg (has no administration in time range)  famotidine (PEPCID) tablet 20 mg (has no administration in time range)  alum & mag hydroxide-simeth (MAALOX/MYLANTA) 200-200-20 MG/5ML suspension 30 mL (has no administration in time range)    ED Course/ Medical Decision Making/ A&P                             Medical Decision Making Problems Addressed: Anxiety: chronic illness or injury with exacerbation, progression, or side effects of treatment that poses a threat to life or bodily functions Atypical chest pain: acute illness or injury with systemic symptoms Chest wall pain: acute illness or injury with systemic symptoms  Amount and/or Complexity of Data Reviewed External Data Reviewed: notes. Labs:  Decision-making details documented in ED Course. Radiology:  Decision-making details documented in ED Course. ECG/medicine tests: ordered and independent interpretation performed. Decision-making details documented in ED Course.  Risk OTC drugs. Prescription drug management. Decision regarding hospitalization.   Pt with several recent ED evals and cardiology evals for same symptoms within past week/months.   Differential diagnosis includes acs, msk cp, gi cp, etc . Dispo decision including potential need for admission considered - will get ecg and reassess.   Reviewed nursing notes and prior charts for additional history. External reports reviewed. Additional history from: EMS.   Cardiac monitor: sinus rhythm, rate  74.  Recent labs reviewed/interpreted by me - cbc and chem normal. Trop normal.   Recent Xrays reviewed/interpreted by me - no pna.   Pepcid, maalox and acetaminophen as need.   Ativan po for anxiety.  Recent cardiology eval reviewed - they note v recent PE study neg for pe, and recommend no further cardiac eval for what they feel is non cardiac chest pain.           Final Clinical Impression(s) / ED Diagnoses Final diagnoses:  None    Rx / DC Orders ED Discharge Orders     None         Lajean Saver, MD 11/14/22 1436

## 2022-11-14 NOTE — ED Notes (Signed)
Patient only took ativan pill and states he will choke if he tries to take anymore. RN offered apple sauce and sandwich. Patient refused.

## 2022-11-14 NOTE — ED Notes (Signed)
Discharge instructions reviewed with pt. Pt assisted to a wheelchair by this RN and Robbie Lis., RN. Pt reports that he still has some leg pain but discussed with pt that he has been cleared by the doctor and he has been prescribed medications that he can take at home. Pt understands and is rolled out to the lobby by this RN in a wheelchair and given a bus pass. Pt also made aware that there is a phone in the lobby if he needs to make any phone calls. Pt has no other questions at this time.

## 2022-11-17 ENCOUNTER — Emergency Department (HOSPITAL_COMMUNITY): Payer: Medicare Other

## 2022-11-17 ENCOUNTER — Encounter (HOSPITAL_COMMUNITY): Payer: Self-pay

## 2022-11-17 ENCOUNTER — Other Ambulatory Visit: Payer: Self-pay

## 2022-11-17 ENCOUNTER — Encounter (HOSPITAL_COMMUNITY): Payer: Self-pay | Admitting: Emergency Medicine

## 2022-11-17 ENCOUNTER — Inpatient Hospital Stay (HOSPITAL_COMMUNITY)
Admission: EM | Admit: 2022-11-17 | Discharge: 2022-11-21 | DRG: 682 | Disposition: A | Discharge status: Other Acute Inpatient Hospital | Payer: Medicare Other | Attending: Internal Medicine | Admitting: Internal Medicine

## 2022-11-17 ENCOUNTER — Emergency Department (HOSPITAL_COMMUNITY)
Admission: EM | Admit: 2022-11-17 | Discharge: 2022-11-17 | Disposition: A | Discharge status: Home / Self Care | Payer: Medicare Other | Source: Home / Self Care | Attending: Emergency Medicine | Admitting: Emergency Medicine

## 2022-11-17 DIAGNOSIS — K219 Gastro-esophageal reflux disease without esophagitis: Secondary | ICD-10-CM | POA: Diagnosis present

## 2022-11-17 DIAGNOSIS — F419 Anxiety disorder, unspecified: Secondary | ICD-10-CM | POA: Diagnosis not present

## 2022-11-17 DIAGNOSIS — R4182 Altered mental status, unspecified: Secondary | ICD-10-CM | POA: Diagnosis not present

## 2022-11-17 DIAGNOSIS — F22 Delusional disorders: Secondary | ICD-10-CM | POA: Diagnosis not present

## 2022-11-17 DIAGNOSIS — E43 Unspecified severe protein-calorie malnutrition: Secondary | ICD-10-CM | POA: Diagnosis present

## 2022-11-17 DIAGNOSIS — Z681 Body mass index (BMI) 19 or less, adult: Secondary | ICD-10-CM

## 2022-11-17 DIAGNOSIS — E872 Acidosis, unspecified: Secondary | ICD-10-CM

## 2022-11-17 DIAGNOSIS — Z79899 Other long term (current) drug therapy: Secondary | ICD-10-CM

## 2022-11-17 DIAGNOSIS — K5900 Constipation, unspecified: Secondary | ICD-10-CM | POA: Diagnosis present

## 2022-11-17 DIAGNOSIS — I10 Essential (primary) hypertension: Secondary | ICD-10-CM | POA: Diagnosis present

## 2022-11-17 DIAGNOSIS — Z888 Allergy status to other drugs, medicaments and biological substances status: Secondary | ICD-10-CM

## 2022-11-17 DIAGNOSIS — F411 Generalized anxiety disorder: Secondary | ICD-10-CM

## 2022-11-17 DIAGNOSIS — N179 Acute kidney failure, unspecified: Secondary | ICD-10-CM | POA: Diagnosis not present

## 2022-11-17 DIAGNOSIS — R64 Cachexia: Secondary | ICD-10-CM | POA: Diagnosis present

## 2022-11-17 DIAGNOSIS — E876 Hypokalemia: Secondary | ICD-10-CM | POA: Diagnosis present

## 2022-11-17 DIAGNOSIS — G894 Chronic pain syndrome: Secondary | ICD-10-CM | POA: Diagnosis present

## 2022-11-17 DIAGNOSIS — Z801 Family history of malignant neoplasm of trachea, bronchus and lung: Secondary | ICD-10-CM

## 2022-11-17 DIAGNOSIS — E860 Dehydration: Secondary | ICD-10-CM | POA: Diagnosis present

## 2022-11-17 DIAGNOSIS — F333 Major depressive disorder, recurrent, severe with psychotic symptoms: Secondary | ICD-10-CM

## 2022-11-17 DIAGNOSIS — R0600 Dyspnea, unspecified: Secondary | ICD-10-CM | POA: Insufficient documentation

## 2022-11-17 DIAGNOSIS — F32A Depression, unspecified: Secondary | ICD-10-CM | POA: Diagnosis present

## 2022-11-17 DIAGNOSIS — Z1152 Encounter for screening for COVID-19: Secondary | ICD-10-CM

## 2022-11-17 DIAGNOSIS — Z881 Allergy status to other antibiotic agents status: Secondary | ICD-10-CM

## 2022-11-17 DIAGNOSIS — R7989 Other specified abnormal findings of blood chemistry: Secondary | ICD-10-CM | POA: Diagnosis not present

## 2022-11-17 DIAGNOSIS — H9193 Unspecified hearing loss, bilateral: Secondary | ICD-10-CM | POA: Diagnosis present

## 2022-11-17 DIAGNOSIS — E785 Hyperlipidemia, unspecified: Secondary | ICD-10-CM | POA: Diagnosis present

## 2022-11-17 DIAGNOSIS — Z5329 Procedure and treatment not carried out because of patient's decision for other reasons: Secondary | ICD-10-CM | POA: Insufficient documentation

## 2022-11-17 DIAGNOSIS — Z91148 Patient's other noncompliance with medication regimen for other reason: Secondary | ICD-10-CM

## 2022-11-17 DIAGNOSIS — Z883 Allergy status to other anti-infective agents status: Secondary | ICD-10-CM

## 2022-11-17 DIAGNOSIS — R1310 Dysphagia, unspecified: Secondary | ICD-10-CM | POA: Diagnosis present

## 2022-11-17 LAB — CBC WITH DIFFERENTIAL/PLATELET
Abs Immature Granulocytes: 0.01 10*3/uL (ref 0.00–0.07)
Basophils Absolute: 0.1 10*3/uL (ref 0.0–0.1)
Basophils Relative: 1 %
Eosinophils Absolute: 0 10*3/uL (ref 0.0–0.5)
Eosinophils Relative: 0 %
HCT: 43.2 % (ref 39.0–52.0)
Hemoglobin: 15 g/dL (ref 13.0–17.0)
Immature Granulocytes: 0 %
Lymphocytes Relative: 18 %
Lymphs Abs: 1.2 10*3/uL (ref 0.7–4.0)
MCH: 31.6 pg (ref 26.0–34.0)
MCHC: 34.7 g/dL (ref 30.0–36.0)
MCV: 91.1 fL (ref 80.0–100.0)
Monocytes Absolute: 0.5 10*3/uL (ref 0.1–1.0)
Monocytes Relative: 7 %
Neutro Abs: 5.1 10*3/uL (ref 1.7–7.7)
Neutrophils Relative %: 74 %
Platelets: 197 10*3/uL (ref 150–400)
RBC: 4.74 MIL/uL (ref 4.22–5.81)
RDW: 12.7 % (ref 11.5–15.5)
WBC: 6.9 10*3/uL (ref 4.0–10.5)
nRBC: 0 % (ref 0.0–0.2)

## 2022-11-17 LAB — BASIC METABOLIC PANEL
Anion gap: 20 — ABNORMAL HIGH (ref 5–15)
BUN: 41 mg/dL — ABNORMAL HIGH (ref 8–23)
CO2: 18 mmol/L — ABNORMAL LOW (ref 22–32)
Calcium: 9.4 mg/dL (ref 8.9–10.3)
Chloride: 98 mmol/L (ref 98–111)
Creatinine, Ser: 1.4 mg/dL — ABNORMAL HIGH (ref 0.61–1.24)
GFR, Estimated: 55 mL/min — ABNORMAL LOW (ref >60)
Glucose, Bld: 142 mg/dL — ABNORMAL HIGH (ref 70–99)
Potassium: 3.2 mmol/L — ABNORMAL LOW (ref 3.5–5.1)
Sodium: 136 mmol/L (ref 135–145)

## 2022-11-17 LAB — TROPONIN I (HIGH SENSITIVITY): Troponin I (High Sensitivity): 60 ng/L — ABNORMAL HIGH (ref <18)

## 2022-11-17 LAB — ETHANOL: Alcohol, Ethyl (B): <10 mg/dL (ref <10)

## 2022-11-17 LAB — SALICYLATE LEVEL: Salicylate Lvl: <7 mg/dL — ABNORMAL LOW (ref 7.0–30.0)

## 2022-11-17 LAB — ACETAMINOPHEN LEVEL: Acetaminophen (Tylenol), Serum: <10 ug/mL — ABNORMAL LOW (ref 10–30)

## 2022-11-17 MED ORDER — HALOPERIDOL LACTATE 5 MG/ML IJ SOLN
INTRAMUSCULAR | Status: AC
  Filled 2022-11-17: qty 1

## 2022-11-17 MED ORDER — HALOPERIDOL LACTATE 5 MG/ML IJ SOLN
2.5000 mg | Freq: Once | INTRAMUSCULAR | Status: AC
  Administered 2022-11-17: 2.5 mg via INTRAMUSCULAR

## 2022-11-17 MED ORDER — LACTATED RINGERS IV SOLN: INTRAVENOUS | Status: DC

## 2022-11-17 MED ORDER — ENOXAPARIN SODIUM 40 MG/0.4ML IJ SOSY
40.0000 mg | PREFILLED_SYRINGE | INTRAMUSCULAR | Status: DC
  Administered 2022-11-18 – 2022-11-20 (×3): 40 mg via SUBCUTANEOUS
  Filled 2022-11-17 (×3): qty 0.4

## 2022-11-17 MED ORDER — SODIUM CHLORIDE 0.9 % IV BOLUS
500.0000 mL | Freq: Once | INTRAVENOUS | Status: AC
  Administered 2022-11-17: 500 mL via INTRAVENOUS

## 2022-11-17 MED ORDER — LORAZEPAM 2 MG/ML IJ SOLN: 2.0000 mg | Freq: Once | INTRAMUSCULAR | Status: AC

## 2022-11-17 MED ORDER — LORAZEPAM 2 MG/ML IJ SOLN
INTRAMUSCULAR | Status: AC
  Administered 2022-11-17: 2 mg via INTRAMUSCULAR
  Filled 2022-11-17: qty 1

## 2022-11-17 MED ORDER — POTASSIUM CHLORIDE 10 MEQ/100ML IV SOLN
10.0000 meq | Freq: Once | INTRAVENOUS | Status: AC
  Administered 2022-11-17: 10 meq via INTRAVENOUS
  Filled 2022-11-17: qty 100

## 2022-11-17 MED ORDER — DILTIAZEM HCL-DEXTROSE 125-5 MG/125ML-% IV SOLN (PREMIX)
5.0000 mg/h | INTRAVENOUS | Status: DC
  Filled 2022-11-17: qty 125

## 2022-11-17 NOTE — ED Notes (Signed)
Pt noted to have rate of 170. Pt moved to ED room 6. Pt trying to text incomprehensible messages on his phone and not following requests as we attach pt to monitor. Pt initially not speaking becoming more agitated and trying to get out of bed. ED provider notified. Pt mumbling about "my wife has everything. My [facility] is intoxicating me with this substance."

## 2022-11-17 NOTE — ED Notes (Signed)
Patient ambulatory in hallway w/o difficulty

## 2022-11-17 NOTE — ED Notes (Signed)
Pt did not allow RN to obtain EKG. Pt would not sit down and put phone down for RN. Attempted to obtain EKG x3, Pt wheelchair to lobby.

## 2022-11-17 NOTE — ED Notes (Signed)
Patient walking out, advised patient to stay and refused.

## 2022-11-17 NOTE — ED Notes (Signed)
Md at bedside

## 2022-11-17 NOTE — ED Notes (Signed)
Contacted pt's son at pt's request and updated son to pt's status.

## 2022-11-17 NOTE — ED Provider Notes (Signed)
[facility] Provider Note   CSN: [id number] Arrival date & time: 11/17/22  1357     History  Chief Complaint  Patient presents with   Shortness of Breath    Brad [name] is a 67 y.o. male.  67 year old male with prior medical history as detailed below presents for evaluation.  Patient is complaining of "micro plastics in his HVAC system that are contaminating his body".  Patient is renting.  He reports that his landlord will not repair or replace the HVAC system.  He feels that his HVAC system is contaminated.  Patient is visibly anxious.  He denies suicidality or homicidality.  Of note, patient with multiple recent ED evaluations over the last 2 months.  The history is provided by the patient and medical records.       Home Medications Prior to Admission medications   Medication Sig Start Date End Date Taking? Authorizing Provider  Alpha-Lipoic Acid 600 MG CAPS Take 600 mg by mouth daily.    [provider]  amLODipine (NORVASC) 5 MG tablet Take 2 tablets (10 mg total) by mouth daily. 03/02/19   Johnn Hai, MD  atorvastatin (LIPITOR) 10 MG tablet Take 10 mg by mouth 3 (three) times a week. MWF 08/10/19   [provider]  busPIRone (BUSPAR) 10 MG tablet Take 20 mg by mouth 3 (three) times daily.     [provider]  Cholecalciferol (VITAMIN D3) 125 MCG (5000 UT) TABS Take 5,000 Units by mouth daily.    [provider]  Coenzyme Q10 50 MG CAPS Take 50 mg by mouth daily.    [provider]  cyclobenzaprine (FLEXERIL) 10 MG tablet Take 1 tablet (10 mg total) by mouth 2 (two) times daily as needed for muscle spasms. 11/06/22   Azucena Cecil, PA-C  docusate sodium (COLACE) 100 MG capsule Take 1 capsule (100 mg total) by mouth every 12 (twelve) hours. 09/26/22   Valarie Merino, MD  hydrOXYzine (ATARAX) 25 MG tablet Take 1 tablet (25 mg total) by mouth every 8 (eight) hours as needed.  11/14/22   Lajean Saver, MD  LORazepam (ATIVAN) 1 MG tablet Take 1 mg by mouth 2 (two) times daily. 10/26/18   [provider]  losartan (COZAAR) 100 MG tablet Take 100 mg by mouth daily. 02/04/20   [provider]  mirtazapine (REMERON) 45 MG tablet Take 1 tablet (45 mg total) by mouth at bedtime. 10/21/22   Raiford Noble Latif, DO  Multiple Vitamin (MULTIVITAMIN) tablet Take 1 tablet by mouth daily.    [provider]  Omega-3 1000 MG CAPS Take 1,000 mg by mouth daily.    [provider]  polyethylene glycol (MIRALAX) 17 g packet Take 17 g by mouth 2 (two) times daily. 10/21/22   Raiford Noble Latif, DO  pregabalin (LYRICA) 75 MG capsule Take 75 mg by mouth 2 (two) times daily.    [provider]  senna-docusate (SENOKOT-S) 8.6-50 MG tablet Take 1 tablet by mouth at bedtime as needed for mild constipation. 10/13/22   Long, Wonda Olds, MD  traZODone (DESYREL) 50 MG tablet Take 50-100 mg by mouth at bedtime as needed for sleep.    [provider]  zaleplon (SONATA) 10 MG capsule Take 10-20 mg by mouth at bedtime as needed for sleep.    [provider]  zinc gluconate 50 MG tablet Take 50 mg by mouth daily.    [provider]  Allergies    Ciprofloxacin, Citalopram, Duloxetine, Duloxetine hcl, Fluticasone-umeclidin-vilant, Gabapentin, and Oxcarbazepine    Review of Systems   Review of Systems  All other systems reviewed and are negative.   Physical Exam Updated Vital Signs BP (!) 131/91 (BP Location: Left Arm)   Pulse 87   Temp 98.3 F (36.8 C) (Oral)   Resp 18   Ht 5\' 9"  (1.753 m)   Wt 56.7 kg   SpO2 98%   BMI 18.46 kg/m  Physical Exam Vitals and nursing note reviewed.  Constitutional:      General: He is not in acute distress.    Appearance: Normal appearance. He is well-developed.     Comments: Alert, visibly anxious, oriented x 4, 5 out of 5 strength in both upper and lower extremities, normal speech  HENT:      Head: Normocephalic and atraumatic.  Eyes:     Conjunctiva/sclera: Conjunctivae normal.     Pupils: Pupils are equal, round, and reactive to light.  Cardiovascular:     Rate and Rhythm: Normal rate and regular rhythm.     Heart sounds: Normal heart sounds.  Pulmonary:     Effort: Pulmonary effort is normal. No respiratory distress.     Breath sounds: Normal breath sounds.  Abdominal:     General: There is no distension.     Palpations: Abdomen is soft.     Tenderness: There is no abdominal tenderness.  Musculoskeletal:        General: No deformity. Normal range of motion.     Cervical back: Normal range of motion and neck supple.  Skin:    General: Skin is warm and dry.  Neurological:     General: No focal deficit present.     Mental Status: He is alert and oriented to person, place, and time.  Psychiatric:     Comments: Alert, cooperative, anxious, denies suicidality or homicidality     ED Results / Procedures / Treatments   Labs (all labs ordered are listed, but only abnormal results are displayed) Labs Reviewed - No data to display  EKG None  Radiology No results found.  Procedures Procedures    Medications Ordered in ED Medications - No data to display  ED Course/ Medical Decision Making/ A&P                             Medical Decision Making Amount and/or Complexity of Data Reviewed Radiology: ordered.    Medical Screen Complete  This patient presented to the ED with complaint of shortness of breath, anxiety.  This complaint involves an extensive number of treatment options. The initial differential diagnosis includes, but is not limited to, anxiety  This presentation is: Chronic, Self-Limited, Previously Undiagnosed, Uncertain Prognosis, Complicated, and Systemic Symptoms  Patient with multiple frequent recent ED evaluations with multiple complaints.  Patient with significant underlying levels of anxiety.  On his visitation today he is  complaining of shortness of breath secondary to "micro plastics in his HVAC system."  Patient is without endorsement of suicidal ideation or homicidal ideation.  Patient is alert and interactive appropriately during the exam.  He is visibly anxious.  Obtained EKG and chest x-ray are without significant abnormality.  Prior to completion of workup and official discharge the patient apparently got up and walked out of the ED.  Staff did not inform this provider that the patient had left.  Patient has capacity to leave [place].  Patient left AMA prior to completion of ED evaluation.     Additional history obtained:  External records from outside sources obtained and reviewed including prior ED visits and prior Inpatient records.    Imaging Studies ordered:  I ordered imaging studies including CXR  I independently visualized and interpreted obtained imaging which showed NAD I agree with the radiologist interpretation.   Cardiac Monitoring:  The patient was maintained on a cardiac monitor.  I personally viewed and interpreted the cardiac monitor which showed an underlying rhythm of: NSR   Problem List / ED Course:  Anxiety   Reevaluation:  After the interventions noted above, I reevaluated the patient and found that they have: stayed the same   Disposition:  After consideration of the diagnostic results and the patients response to treatment, I feel that the patent would benefit from completion of ED evaluation.          Final Clinical Impression(s) / ED Diagnoses Final diagnoses:  Dyspnea, unspecified type    Rx / DC Orders ED Discharge Orders     None         Valarie Merino, MD 11/17/22 1845

## 2022-11-17 NOTE — ED Triage Notes (Signed)
Per [facility] pt called out for shortness of breath. On arrival patient was laying in the yard stating there were things in his house causing it. Patient not wanting to cooperate with vital signs. Mouthing "I'm trying" and refusing to speak.

## 2022-11-17 NOTE — ED Provider Notes (Signed)
[place] EMERGENCY DEPARTMENT AT [facility] Provider Note   CSN: [id number] Arrival date & time: 11/17/22  1914     History  Chief Complaint  Patient presents with   Chest Pain    Brad [name] is a 67 y.o. male.  67 year old male with prior medical history as detailed below presents for evaluation.    Patient was here earlier this evening for evaluation.  He left AMA prior to completion of evaluation.  Apparently the patient never left the property of the hospital.  He checked back in.  On retriage he was found to be tachycardic into the 170s.  He appeared to be significantly agitated.  He continues to be fixated on the "micro plastics" emanating from his HVAC system at his residence.  He continues to state " I am dying from the micro plastics".  Patient's agitation was significant enough to interfere with attempts to provide care.  Patient was given Ativan and Haldol to help calm his agitation levels and allow medical evaluation.  The history is provided by the patient and medical records.       Home Medications Prior to Admission medications   Medication Sig Start Date End Date Taking? Authorizing Provider  Alpha-Lipoic Acid 600 MG CAPS Take 600 mg by mouth daily.    [provider]  amLODipine (NORVASC) 5 MG tablet Take 2 tablets (10 mg total) by mouth daily. 03/02/19   Johnn Hai, MD  atorvastatin (LIPITOR) 10 MG tablet Take 10 mg by mouth 3 (three) times a week. MWF 08/10/19   [provider]  busPIRone (BUSPAR) 10 MG tablet Take 20 mg by mouth 3 (three) times daily.     [provider]  Cholecalciferol (VITAMIN D3) 125 MCG (5000 UT) TABS Take 5,000 Units by mouth daily.    [provider]  Coenzyme Q10 50 MG CAPS Take 50 mg by mouth daily.    [provider]  cyclobenzaprine (FLEXERIL) 10 MG tablet Take 1 tablet (10 mg total) by mouth 2 (two) times daily as needed for muscle spasms. 11/06/22   Azucena Cecil, PA-C  docusate sodium (COLACE) 100 MG capsule Take 1 capsule (100 mg total) by mouth every 12 (twelve) hours. 09/26/22   Valarie Merino, MD  hydrOXYzine (ATARAX) 25 MG tablet Take 1 tablet (25 mg total) by mouth every 8 (eight) hours as needed. 11/14/22   Lajean Saver, MD  LORazepam (ATIVAN) 1 MG tablet Take 1 mg by mouth 2 (two) times daily. 10/26/18   [provider]  losartan (COZAAR) 100 MG tablet Take 100 mg by mouth daily. 02/04/20   [provider]  mirtazapine (REMERON) 45 MG tablet Take 1 tablet (45 mg total) by mouth at bedtime. 10/21/22   Raiford Noble Latif, DO  Multiple Vitamin (MULTIVITAMIN) tablet Take 1 tablet by mouth daily.    [provider]  Omega-3 1000 MG CAPS Take 1,000 mg by mouth daily.    [provider]  polyethylene glycol (MIRALAX) 17 g packet Take 17 g by mouth 2 (two) times daily. 10/21/22   Raiford Noble Latif, DO  pregabalin (LYRICA) 75 MG capsule Take 75 mg by mouth 2 (two) times daily.    [provider]  senna-docusate (SENOKOT-S) 8.6-50 MG tablet Take 1 tablet by mouth at bedtime as needed for mild constipation. 10/13/22   Long, Wonda Olds, MD  traZODone (DESYREL) 50 MG tablet Take 50-100 mg by mouth at bedtime as needed for sleep.  [provider]  zaleplon (SONATA) 10 MG capsule Take 10-20 mg by mouth at bedtime as needed for sleep.    [provider]  zinc gluconate 50 MG tablet Take 50 mg by mouth daily.    [provider]      Allergies    Ciprofloxacin, Citalopram, Duloxetine, Duloxetine hcl, Fluticasone-umeclidin-vilant, Gabapentin, and Oxcarbazepine    Review of Systems   Review of Systems  All other systems reviewed and are negative.   Physical Exam Updated Vital Signs BP (!) 150/114   Pulse (!) 137   Temp 97.9 F (36.6 C) (Oral)   Resp 19   SpO2 100%  Physical Exam Vitals and nursing note reviewed.  Constitutional:      General: He is not in acute distress.     Appearance: Normal appearance. He is well-developed.     Comments: Alert, agitated, not cooperative with attempts to obtain EKG, placed on monitor etc.  HENT:     Head: Normocephalic and atraumatic.  Eyes:     Conjunctiva/sclera: Conjunctivae normal.     Pupils: Pupils are equal, round, and reactive to light.  Cardiovascular:     Rate and Rhythm: Tachycardia present. Rhythm irregular.     Heart sounds: Normal heart sounds.  Pulmonary:     Effort: Pulmonary effort is normal. Tachypnea present. No respiratory distress.     Breath sounds: Normal breath sounds.  Abdominal:     General: There is no distension.     Palpations: Abdomen is soft.     Tenderness: There is no abdominal tenderness.  Musculoskeletal:        General: No deformity. Normal range of motion.     Cervical back: Normal range of motion and neck supple.  Skin:    General: Skin is warm and dry.  Neurological:     General: No focal deficit present.     Mental Status: He is alert and oriented to person, place, and time.  Psychiatric:     Comments: Alert, agitated, perseverates on "micro plastics" that have "contaminated his body from his HVAC system"     ED Results / Procedures / Treatments   Labs (all labs ordered are listed, but only abnormal results are displayed) Labs Reviewed  BASIC METABOLIC PANEL - Abnormal; Notable for the following components:      Result Value   Potassium 3.2 (*)    CO2 18 (*)    Glucose, Bld 142 (*)    BUN 41 (*)    Creatinine, Ser 1.40 (*)    GFR, Estimated 55 (*)    Anion gap 20 (*)    All other components within normal limits  ACETAMINOPHEN LEVEL - Abnormal; Notable for the following components:   Acetaminophen (Tylenol), Serum <10 (*)    All other components within normal limits  SALICYLATE LEVEL - Abnormal; Notable for the following components:   Salicylate Lvl [id number] (*)    All other components within normal limits  TROPONIN I (HIGH SENSITIVITY) - Abnormal; Notable for the  following components:   Troponin I (High Sensitivity) 60 (*)    All other components within normal limits  CBC WITH DIFFERENTIAL/PLATELET  ETHANOL  RAPID URINE DRUG SCREEN, HOSP PERFORMED  TROPONIN I (HIGH SENSITIVITY)    EKG EKG Interpretation  Date/Time:  Wednesday November 17 2022 20:46:43 EDT Ventricular Rate:  171 PR Interval:    QRS Duration: 80 QT Interval:  274 QTC Calculation: 462 R Axis:   -25 Text Interpretation: Poor baseline Confirmed  by Dene Gentry [phone]) on 11/17/2022 9:19:20 PM  Radiology DG Chest 2 View  Result Date: 11/17/2022 CLINICAL DATA:  dyspnea EXAM: CHEST - 2 VIEW COMPARISON:  11/12/2022. FINDINGS: Cardiac silhouette is unremarkable. No pneumothorax or pleural effusion. The lungs are clear. Convex right lower thoracic scoliosis. IMPRESSION: No acute cardiopulmonary process. Electronically Signed   By: Sammie Bench M.D.   On: 11/17/2022 17:21    Procedures Procedures    Medications Ordered in ED Medications  haloperidol lactate (HALDOL) injection 2.5 mg (has no administration in time range)  LORazepam (ATIVAN) injection 2 mg (2 mg Intramuscular Given 11/17/22 2111)    ED Course/ Medical Decision Making/ A&P                             Medical Decision Making Amount and/or Complexity of Data Reviewed Labs: ordered.  Risk Prescription drug management. Decision regarding hospitalization.    Medical Screen Complete  This patient presented to the ED with complaint of agitation, delusions.  This complaint involves an extensive number of treatment options. The initial differential diagnosis includes, but is not limited to, psychosis, metabolic abnormality, arrhythmia, etc.  This presentation is: Acute, Self-Limited, Previously Undiagnosed, Uncertain Prognosis, Complicated, Systemic Symptoms, and Threat to Life/Bodily Function  Patient is presenting with complaints similar to prior evaluations.  Patient with gradually worsening delusions  regarding his health.  Current delusions centered around exposure to possible "micro plastics emanating from his HVAC system."  On triage this evening the patient was found to be in a rapid heart rate up into the 180s.  EKG obtained is suggestive of A-fib with RVR.  Shortly after episode of possible A-fib with RVR patient's rhythm returned to a clear normal sinus rhythm.  Patient without history of arrhythmia or cardiac disease.  Patient was significantly agitated on initial evaluation and required Ativan and Haldol to calm him enough to allow medical evaluation to occur.  Tachycardia concerning for possible arrhythmia occurred during period of significant agitation.  Given patient's significant delusional state and likely psychosis and uncooperative behavior IVC hold initiated.  EKG and case discussed with on-call cardiology fellow Dr. Patsey Berthold.  Agrees with plan to admit to medicine for troponin trend , monitoring for arrhythmia, mildly elevated creatinine.  No indication for anticoagulation at this time.  Hospitalist service made aware of case and will evaluate for admission.     Additional history obtained:  External records from outside sources obtained and reviewed including prior ED visits and prior Inpatient records.    Lab Tests:  I ordered and personally interpreted labs.  The pertinent results include: CBC, BMP, acetaminophen, salicylate, troponin, EtOH, urine tox   Imaging Studies ordered:  I ordered imaging studies including chest x-ray I independently visualized and interpreted obtained imaging which showed NAD I agree with the radiologist interpretation.   Cardiac Monitoring:  The patient was maintained on a cardiac monitor.  I personally viewed and interpreted the cardiac monitor which showed an underlying rhythm of: Tachycardia, normal sinus rhythm   Medicines ordered:  I ordered medication including Haldol, Ativan, potassium, IV fluids for agitation,  hypokalemia, suspected dehydration Reevaluation of the patient after these medicines showed that the patient: improved    Problem List / ED Course:  Delusions, AKI, elevated troponin, arrhythmia   Reevaluation:  After the interventions noted above, I reevaluated the patient and found that they have: improved   Disposition:  After consideration of the diagnostic results and the  patients response to treatment, I feel that the patent would benefit from admission.          Final Clinical Impression(s) / ED Diagnoses Final diagnoses:  Elevated troponin  Delusion ([place])    Rx / DC Orders ED Discharge Orders     None         Valarie Merino, MD 11/17/22 2310

## 2022-11-17 NOTE — ED Notes (Signed)
Pt Belongings x1 Long Sleeve Black Shirt, White T-Shirt, [place], Car Keys, Iphone placed in backpack, all items placed in [employer] 5-8.

## 2022-11-17 NOTE — ED Triage Notes (Signed)
Pt came in with c/o heart palpitation and chest pain.

## 2022-11-17 NOTE — ED Notes (Signed)
Pt refusing to get out of chair into ED stretcher stating "I need life support"

## 2022-11-17 NOTE — H&P (Signed)
History and Physical    PatientMarland Kitchen Weston [name] Brad [name] DOB: [birth year] DOA: 11/17/2022 DOS: the patient was seen and examined on 11/18/2022 PCP: Patient, No Pcp Per  Patient coming from: Home  Chief Complaint:  Chief Complaint  Patient presents with   Chest Pain   HPI: Brad [name] is a 67 y.o. male with medical history significant of HTN, HLD, anxiety and depression who presents with complain of his "knees being twisted."  He is alert and oriented x 4. Tells me he called EMS being his knees and spine were twisted and wants "body shaping" therapy. He just noticed it today. States he has not slept in 4 days or eaten. No nausea, vomiting. Feels constipated. States he has missed his medications for a few days.  Pt last admitted from 10/19/22-10/21/22 with numerous somatic complaints and was fixated on symptoms being related to his HVAC system and microfiberglass being inhaled. He was evaluated by cardiology, pulmonology, psychiatry. Had serial troponin, EKGs and echocardiogram that ruled out ACS cause of chest pain.  Pulmonology evaluated and recommended outpatient follow-up.  Psychiatry made adjustments to his medications and offered social work resources but he refused. Pt noted by discharging physician to likely be malingering and would come up with new somatic complaints to avoid discharge.   Since his last admission he has almost been evaluated daily in ED for various complaints without significant findings.   Today he was found by EMS in the field laying in his yard stating things in his house were causing him shortness of breath. He then left AMA in ED prior to full evaluation but then checked back in later this evening. Upon returning he was notable more agitated and tachycardic with HR 170. Had to be sedated with Haldo and Ativan. EKG showed questionable atrial fibrillation that was reviewed by cardiology fellow overnight who was also unsure of EKG findings.Troponin is elevated to 60  compare to prior of 12 last week. Recommended keeping on observation overnight.   He was also noted to have AKI with increase anion gap. He has been placed under IVC by ED physician.  Review of Systems: As mentioned in the history of present illness. All other systems reviewed and are negative. Past Medical History:  Diagnosis Date   Allergy    Anxiety    Chest pain    Depression    DJD (degenerative joint disease), cervical    postiton with pillow under knees, cant turn neck    Dysentery, amebic, acute 08/31/1979   GERD (gastroesophageal reflux disease)    H/O bronchitis    H/O malaria 08/30/1982   Hearing loss    bilateral    Hypertension    labile Blood pressure   Inguinal hernia    Insomnia    early morning awakening   MVP (mitral valve prolapse)    "no problems"   Perianal pain    Personal history of colonic polyps    Tinnitus    right ear   Past Surgical History:  Procedure Laterality Date   CARPAL TUNNEL RELEASE  10/06, 5/10   right wrist    CARPAL TUNNEL RELEASE  3/10   left wrist    cervical spine discectomy   09/2005   COLONOSCOPY WITH PROPOFOL N/A 10/21/2015   Procedure: COLONOSCOPY WITH PROPOFOL;  Surgeon: [name], MD;  Location: [facility];  Service: Endoscopy;  Laterality: N/A;   INGUINAL HERNIA REPAIR  08/16/2011   Procedure: HERNIA REPAIR INGUINAL ADULT;  Surgeon: Isabel Caprice  Hassell Done, MD;  Location: [facility];  Service: General;  Laterality: Left;   INGUINAL HERNIA REPAIR Right 05/03/2014   Procedure: OPEN RIGHT INGUINAL HERNIA REPAIR WITH MESH;  Surgeon: [name], MD;  Location: [facility];  Service: General;  Laterality: Right;   INGUINAL HERNIA REPAIR Left 08/20/2022   Procedure: HERNIA REPAIR INGUINAL ADULT;  Surgeon: [name], MD;  Location: [facility];  Service: General;  Laterality: Left;   INSERTION OF MESH Right 05/03/2014   Procedure: INSERTION OF MESH;  Surgeon: [name], MD;  Location: [facility];  Service: General;   Laterality: Right;   TONSILLECTOMY  1960   Social History:  reports that he has never smoked. He has never used smokeless tobacco. He reports current alcohol use. He reports that he does not currently use drugs after having used the following drugs: Marijuana.  Allergies  Allergen Reactions   Ciprofloxacin Other (See Comments)    Other Reaction(s): Dizziness (intolerance)   Citalopram Other (See Comments)   Duloxetine Other (See Comments)    Manic emotions  Other Reaction(s): Other (See Comments)    Manic emotions   Duloxetine Hcl Other (See Comments)    Manic emotions   Fluticasone-Umeclidin-Vilant Other (See Comments)    Dry cracked lips and mouth   Gabapentin Other (See Comments)    Unable to urinate, drowsiness  Unable to urinate, drowsiness    Other Reaction(s): Other (See Comments)   Oxcarbazepine Other (See Comments)    Bad taste in his mouth  Other Reaction(s): Other (See Comments)    Bad taste in his mouth    Family History  Problem Relation Age of Onset   Cancer Mother        breat cancer,lung cancer   Intracerebral hemorrhage Father     Prior to Admission medications   Medication Sig Start Date End Date Taking? Authorizing Provider  Alpha-Lipoic Acid 600 MG CAPS Take 600 mg by mouth daily.    [provider]  amLODipine (NORVASC) 5 MG tablet Take 2 tablets (10 mg total) by mouth daily. 03/02/19   Johnn Hai, MD  atorvastatin (LIPITOR) 10 MG tablet Take 10 mg by mouth 3 (three) times a week. MWF 08/10/19   [provider]  busPIRone (BUSPAR) 10 MG tablet Take 20 mg by mouth 3 (three) times daily.     [provider]  Cholecalciferol (VITAMIN D3) 125 MCG (5000 UT) TABS Take 5,000 Units by mouth daily.    [provider]  Coenzyme Q10 50 MG CAPS Take 50 mg by mouth daily.    [provider]  cyclobenzaprine (FLEXERIL) 10 MG tablet Take 1 tablet (10 mg total) by mouth 2 (two) times daily as needed for muscle spasms.  11/06/22   Azucena Cecil, PA-C  docusate sodium (COLACE) 100 MG capsule Take 1 capsule (100 mg total) by mouth every 12 (twelve) hours. 09/26/22   Valarie Merino, MD  hydrOXYzine (ATARAX) 25 MG tablet Take 1 tablet (25 mg total) by mouth every 8 (eight) hours as needed. 11/14/22   Lajean Saver, MD  LORazepam (ATIVAN) 1 MG tablet Take 1 mg by mouth 2 (two) times daily. 10/26/18   [provider]  losartan (COZAAR) 100 MG tablet Take 100 mg by mouth daily. 02/04/20   [provider]  mirtazapine (REMERON) 45 MG tablet Take 1 tablet (45 mg total) by mouth at bedtime. 10/21/22   Raiford Noble Latif, DO  Multiple Vitamin (MULTIVITAMIN) tablet Take 1 tablet by mouth  daily.    [provider]  Omega-3 1000 MG CAPS Take 1,000 mg by mouth daily.    [provider]  polyethylene glycol (MIRALAX) 17 g packet Take 17 g by mouth 2 (two) times daily. 10/21/22   Raiford Noble Latif, DO  pregabalin (LYRICA) 75 MG capsule Take 75 mg by mouth 2 (two) times daily.    [provider]  senna-docusate (SENOKOT-S) 8.6-50 MG tablet Take 1 tablet by mouth at bedtime as needed for mild constipation. 10/13/22   Long, Wonda Olds, MD  traZODone (DESYREL) 50 MG tablet Take 50-100 mg by mouth at bedtime as needed for sleep.    [provider]  zaleplon (SONATA) 10 MG capsule Take 10-20 mg by mouth at bedtime as needed for sleep.    [provider]  zinc gluconate 50 MG tablet Take 50 mg by mouth daily.    [provider]    Physical Exam: Vitals:   11/17/22 2130 11/17/22 2145 11/17/22 2200 11/17/22 2210  BP: 115/89 95/76 107/84 104/78  Pulse: (!) 133 (!) 108 (!) 137 88  Resp: (!) 28 13 (!) 23 (!) 21  Temp:      TempSrc:      SpO2: 99% 99% 97% 98%   Constitutional: NAD, calm, comfortable, thin elderly male sitting upright in bed Eyes: lids and conjunctivae normal ENMT: Mucous membranes are moist. Neck: normal, supple Respiratory: clear to  auscultation bilaterally, no wheezing, no crackles. Normal respiratory effort. No accessory muscle use.  Cardiovascular: Regular rate and rhythm, no murmurs / rubs / gallops. No extremity edema. 2+ pedal pulses. No carotid bruits.  Abdomen: no tenderness, Bowel sounds positive.  Musculoskeletal: no clubbing / cyanosis. No joint deformity upper and lower extremities. Good ROM, no contractures. Normal muscle tone.  Able to flex to and extend bilateral knee with full range of motion. Skin: no rashes, lesions, ulcers. No induration Neurologic: CN 2-12 grossly intact. Sensation intact, Strength 5/5 in all 4.  Psychiatric:  Alert and oriented x 4. Normal mood. Data Reviewed:  See HPI   Assessment and Plan: * AKI (acute kidney injury) ([place]) -Creatinine is elevated to 1.40 from 0.81 with anion gap of 20 -has received 500cc bolus in ED. Will keep on continuous IV fluid overnight  Anxiety and depression -continue home buspirone, PRN hydroxyzine, mirtazapine, trazodone PRN  Altered mental status -Pt has had numerous ED visits for various somatic complaints without significant finding. Today tells me his "knees and spine feels twisted and wants body-shaping therapy" but there are no deformities or pain on exam. Appears to have delusion thoughts about his body and symptoms.  -will still check bilateral knee X-ray to evaluate but warrants more psychiatric evaluation in the morning.  - ED Physician has placed him under IVC since he left AMA earlier and returned with new agitation requiring Haldol and Ativan  Metabolic acidosis -secondary to AKI. Keep on continuous fluid overnight and monitor.   Elevated troponin -EKG with sinus tacycardia difficult to discern if true atrial fibrillation. Now back in normal sinus rhythm.  -keep on continuous telemetry overnight -presenting Troponin of 60 likely due to AKI and tachycardia. UDS is pending.  -will continue to trend serial troponin. Cardiology was  consulted by ED physician and also recommended this.  -Last echocardiogram on 09/2022 with a EF of 60-65 with no regional wall motion abnormalities.  Normal diastolic parameters.  No significant valvular abnormality.      Advance Care Planning: Full  Consults: needs psychiatry  consult in the morning  Family Communication: none at bedside  Severity of Illness: The appropriate patient status for this patient is OBSERVATION. Observation status is judged to be reasonable and necessary in order to provide the required intensity of service to ensure the patient's safety. The patient's presenting symptoms, physical exam findings, and initial radiographic and laboratory data in the context of their medical condition is felt to place them at decreased risk for further clinical deterioration. Furthermore, it is anticipated that the patient will be medically stable for discharge from the hospital within 2 midnights of admission.   Author: Orene Desanctis, DO 11/18/2022 12:24 AM  For on call review [email or web address].

## 2022-11-18 ENCOUNTER — Observation Stay (HOSPITAL_COMMUNITY): Payer: Medicare Other

## 2022-11-18 DIAGNOSIS — F419 Anxiety disorder, unspecified: Secondary | ICD-10-CM | POA: Diagnosis present

## 2022-11-18 DIAGNOSIS — R64 Cachexia: Secondary | ICD-10-CM | POA: Diagnosis present

## 2022-11-18 DIAGNOSIS — E860 Dehydration: Secondary | ICD-10-CM | POA: Diagnosis present

## 2022-11-18 DIAGNOSIS — F22 Delusional disorders: Secondary | ICD-10-CM | POA: Diagnosis present

## 2022-11-18 DIAGNOSIS — Z79899 Other long term (current) drug therapy: Secondary | ICD-10-CM | POA: Diagnosis not present

## 2022-11-18 DIAGNOSIS — R1310 Dysphagia, unspecified: Secondary | ICD-10-CM | POA: Diagnosis present

## 2022-11-18 DIAGNOSIS — E872 Acidosis, unspecified: Secondary | ICD-10-CM | POA: Diagnosis present

## 2022-11-18 DIAGNOSIS — Z881 Allergy status to other antibiotic agents status: Secondary | ICD-10-CM | POA: Diagnosis not present

## 2022-11-18 DIAGNOSIS — F450 Somatization disorder: Secondary | ICD-10-CM | POA: Diagnosis present

## 2022-11-18 DIAGNOSIS — N179 Acute kidney failure, unspecified: Secondary | ICD-10-CM | POA: Diagnosis not present

## 2022-11-18 DIAGNOSIS — R4182 Altered mental status, unspecified: Secondary | ICD-10-CM

## 2022-11-18 DIAGNOSIS — Z888 Allergy status to other drugs, medicaments and biological substances status: Secondary | ICD-10-CM | POA: Diagnosis not present

## 2022-11-18 DIAGNOSIS — Z91148 Patient's other noncompliance with medication regimen for other reason: Secondary | ICD-10-CM | POA: Diagnosis not present

## 2022-11-18 DIAGNOSIS — I10 Essential (primary) hypertension: Secondary | ICD-10-CM | POA: Diagnosis present

## 2022-11-18 DIAGNOSIS — Z8613 Personal history of malaria: Secondary | ICD-10-CM | POA: Diagnosis not present

## 2022-11-18 DIAGNOSIS — Z1152 Encounter for screening for COVID-19: Secondary | ICD-10-CM | POA: Diagnosis not present

## 2022-11-18 DIAGNOSIS — G894 Chronic pain syndrome: Secondary | ICD-10-CM | POA: Diagnosis present

## 2022-11-18 DIAGNOSIS — K219 Gastro-esophageal reflux disease without esophagitis: Secondary | ICD-10-CM | POA: Diagnosis present

## 2022-11-18 DIAGNOSIS — Z801 Family history of malignant neoplasm of trachea, bronchus and lung: Secondary | ICD-10-CM | POA: Diagnosis not present

## 2022-11-18 DIAGNOSIS — F333 Major depressive disorder, recurrent, severe with psychotic symptoms: Secondary | ICD-10-CM | POA: Diagnosis present

## 2022-11-18 DIAGNOSIS — H9193 Unspecified hearing loss, bilateral: Secondary | ICD-10-CM | POA: Diagnosis present

## 2022-11-18 DIAGNOSIS — Z883 Allergy status to other anti-infective agents status: Secondary | ICD-10-CM | POA: Diagnosis not present

## 2022-11-18 DIAGNOSIS — Z681 Body mass index (BMI) 19 or less, adult: Secondary | ICD-10-CM | POA: Diagnosis not present

## 2022-11-18 DIAGNOSIS — F32A Depression, unspecified: Secondary | ICD-10-CM | POA: Diagnosis present

## 2022-11-18 DIAGNOSIS — E785 Hyperlipidemia, unspecified: Secondary | ICD-10-CM | POA: Diagnosis present

## 2022-11-18 DIAGNOSIS — E43 Unspecified severe protein-calorie malnutrition: Secondary | ICD-10-CM | POA: Insufficient documentation

## 2022-11-18 DIAGNOSIS — Z765 Malingerer [conscious simulation]: Secondary | ICD-10-CM | POA: Diagnosis not present

## 2022-11-18 DIAGNOSIS — K5900 Constipation, unspecified: Secondary | ICD-10-CM | POA: Diagnosis present

## 2022-11-18 DIAGNOSIS — E876 Hypokalemia: Secondary | ICD-10-CM | POA: Diagnosis present

## 2022-11-18 LAB — BASIC METABOLIC PANEL
Anion gap: 9 (ref 5–15)
BUN: 40 mg/dL — ABNORMAL HIGH (ref 8–23)
CO2: 24 mmol/L (ref 22–32)
Calcium: 9.1 mg/dL (ref 8.9–10.3)
Chloride: 104 mmol/L (ref 98–111)
Creatinine, Ser: 0.86 mg/dL (ref 0.61–1.24)
GFR, Estimated: >60 mL/min (ref >60)
Glucose, Bld: 121 mg/dL — ABNORMAL HIGH (ref 70–99)
Potassium: 3.1 mmol/L — ABNORMAL LOW (ref 3.5–5.1)
Sodium: 137 mmol/L (ref 135–145)

## 2022-11-18 LAB — RAPID URINE DRUG SCREEN, HOSP PERFORMED
Amphetamines: NOT DETECTED
Barbiturates: NOT DETECTED
Benzodiazepines: POSITIVE — AB
Cocaine: NOT DETECTED
Opiates: NOT DETECTED
Tetrahydrocannabinol: NOT DETECTED

## 2022-11-18 LAB — CBC
HCT: 37.3 % — ABNORMAL LOW (ref 39.0–52.0)
Hemoglobin: 12.8 g/dL — ABNORMAL LOW (ref 13.0–17.0)
MCH: 31.8 pg (ref 26.0–34.0)
MCHC: 34.3 g/dL (ref 30.0–36.0)
MCV: 92.8 fL (ref 80.0–100.0)
Platelets: 184 10*3/uL (ref 150–400)
RBC: 4.02 MIL/uL — ABNORMAL LOW (ref 4.22–5.81)
RDW: 12.8 % (ref 11.5–15.5)
WBC: 6.5 10*3/uL (ref 4.0–10.5)
nRBC: 0 % (ref 0.0–0.2)

## 2022-11-18 LAB — TROPONIN I (HIGH SENSITIVITY)
Troponin I (High Sensitivity): 94 ng/L — ABNORMAL HIGH (ref <18)
Troponin I (High Sensitivity): 90 ng/L — ABNORMAL HIGH (ref <18)

## 2022-11-18 LAB — MAGNESIUM: Magnesium: 2.3 mg/dL (ref 1.7–2.4)

## 2022-11-18 MED ORDER — ZOLPIDEM TARTRATE 5 MG PO TABS
5.0000 mg | ORAL_TABLET | Freq: Every evening | ORAL | Status: DC | PRN
  Administered 2022-11-18 – 2022-11-20 (×2): 5 mg via ORAL
  Filled 2022-11-18 (×2): qty 1

## 2022-11-18 MED ORDER — AMLODIPINE BESYLATE 10 MG PO TABS
10.0000 mg | ORAL_TABLET | Freq: Every day | ORAL | Status: DC
  Administered 2022-11-18 – 2022-11-21 (×4): 10 mg via ORAL
  Filled 2022-11-18 (×4): qty 1

## 2022-11-18 MED ORDER — HYDROXYZINE HCL 25 MG PO TABS
25.0000 mg | ORAL_TABLET | Freq: Three times a day (TID) | ORAL | Status: DC | PRN
  Administered 2022-11-19 – 2022-11-20 (×3): 25 mg via ORAL
  Filled 2022-11-18 (×3): qty 1

## 2022-11-18 MED ORDER — TRAZODONE HCL 100 MG PO TABS
50.0000 mg | ORAL_TABLET | Freq: Every evening | ORAL | Status: DC | PRN
  Administered 2022-11-19 – 2022-11-20 (×2): 100 mg via ORAL
  Filled 2022-11-18 (×2): qty 1

## 2022-11-18 MED ORDER — DOCUSATE SODIUM 100 MG PO CAPS
100.0000 mg | ORAL_CAPSULE | Freq: Two times a day (BID) | ORAL | Status: DC
  Administered 2022-11-18 – 2022-11-21 (×8): 100 mg via ORAL
  Filled 2022-11-18 (×8): qty 1

## 2022-11-18 MED ORDER — VITAMIN D 25 MCG (1000 UNIT) PO TABS
5000.0000 [IU] | ORAL_TABLET | Freq: Every day | ORAL | Status: DC
  Administered 2022-11-18 – 2022-11-21 (×4): 5000 [IU] via ORAL
  Filled 2022-11-18 (×4): qty 5

## 2022-11-18 MED ORDER — HALOPERIDOL LACTATE 5 MG/ML IJ SOLN: 2.5000 mg | Freq: Once | INTRAMUSCULAR | Status: DC | PRN

## 2022-11-18 MED ORDER — ZINC GLUCONATE 50 MG PO TABS: 50.0000 mg | ORAL_TABLET | Freq: Every day | ORAL | Status: DC

## 2022-11-18 MED ORDER — HALOPERIDOL LACTATE 5 MG/ML IJ SOLN: 2.5000 mg | Freq: Once | INTRAMUSCULAR | Status: DC

## 2022-11-18 MED ORDER — BUSPIRONE HCL 5 MG PO TABS
20.0000 mg | ORAL_TABLET | Freq: Three times a day (TID) | ORAL | Status: DC
  Administered 2022-11-18 – 2022-11-21 (×10): 20 mg via ORAL
  Filled 2022-11-18 (×10): qty 4

## 2022-11-18 MED ORDER — ATORVASTATIN CALCIUM 10 MG PO TABS
10.0000 mg | ORAL_TABLET | ORAL | Status: DC
  Administered 2022-11-19: 10 mg via ORAL
  Filled 2022-11-18: qty 1

## 2022-11-18 MED ORDER — MIRTAZAPINE 30 MG PO TABS
30.0000 mg | ORAL_TABLET | Freq: Every day | ORAL | Status: DC
  Administered 2022-11-18 – 2022-11-20 (×4): 30 mg via ORAL
  Filled 2022-11-18 (×4): qty 1
  Filled 2022-11-18 (×2): qty 2

## 2022-11-18 MED ORDER — LORAZEPAM 1 MG PO TABS
1.0000 mg | ORAL_TABLET | Freq: Two times a day (BID) | ORAL | Status: DC
  Administered 2022-11-18 – 2022-11-21 (×8): 1 mg via ORAL
  Filled 2022-11-18 (×8): qty 1

## 2022-11-18 MED ORDER — TRAMADOL HCL 50 MG PO TABS
50.0000 mg | ORAL_TABLET | Freq: Once | ORAL | Status: AC
  Administered 2022-11-18: 50 mg via ORAL
  Filled 2022-11-18: qty 1

## 2022-11-18 MED ORDER — PREGABALIN 50 MG PO CAPS
75.0000 mg | ORAL_CAPSULE | Freq: Two times a day (BID) | ORAL | Status: DC
  Administered 2022-11-18 – 2022-11-21 (×7): 75 mg via ORAL
  Filled 2022-11-18 (×7): qty 1

## 2022-11-18 MED ORDER — ZINC SULFATE 220 (50 ZN) MG PO CAPS
220.0000 mg | ORAL_CAPSULE | Freq: Every day | ORAL | Status: DC
  Administered 2022-11-18 – 2022-11-21 (×4): 220 mg via ORAL
  Filled 2022-11-18 (×4): qty 1

## 2022-11-18 MED ORDER — POTASSIUM CHLORIDE CRYS ER 20 MEQ PO TBCR
40.0000 meq | EXTENDED_RELEASE_TABLET | Freq: Two times a day (BID) | ORAL | Status: DC
  Administered 2022-11-18 – 2022-11-20 (×5): 40 meq via ORAL
  Filled 2022-11-18 (×8): qty 2

## 2022-11-18 MED ORDER — ENSURE ENLIVE PO LIQD
237.0000 mL | Freq: Two times a day (BID) | ORAL | Status: DC
  Administered 2022-11-18 – 2022-11-20 (×5): 237 mL via ORAL

## 2022-11-18 MED ORDER — ADULT MULTIVITAMIN W/MINERALS CH
1.0000 | ORAL_TABLET | Freq: Every day | ORAL | Status: DC
  Administered 2022-11-18 – 2022-11-21 (×4): 1 via ORAL
  Filled 2022-11-18 (×4): qty 1

## 2022-11-18 NOTE — Discharge Instructions (Signed)

## 2022-11-18 NOTE — TOC Progression Note (Signed)
Transition of Care [facility]) - Progression Note    Patient Details  Name: Brad [name] MRN: [id number] Date of Birth: Feb 11, 1956  [facility] of [facility]) CM/SW Contact  Catlin Doria, Juliann Pulse, RN Phone Number: 11/18/2022, 4:27 PM  Clinical Narrative:  Will fax out for IP Psych facilities.     Expected Discharge Plan: Psychiatric Hospital Barriers to Discharge: Continued Medical Work up  Expected Discharge Plan and Services                                               Social Determinants of Health (SDOH) Interventions SDOH Screenings   Food Insecurity: No Food Insecurity (10/20/2022)  Housing: Low Risk  (10/20/2022)  Transportation Needs: No Transportation Needs (10/20/2022)  Utilities: Not At Risk (10/20/2022)  Alcohol Screen: Low Risk  (02/27/2019)  Tobacco Use: Low Risk  (11/17/2022)    Readmission Risk Interventions    08/23/2022    9:38 AM  Readmission Risk Prevention Plan  Post Dischage Appt Complete  Medication Screening Complete  Transportation Screening Complete

## 2022-11-18 NOTE — Progress Notes (Addendum)
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 Tells me he called EMS being his knees and spine were twisted and wants "body shaping" therapy. He just noticed it today. States he has not slept in 4 days or eaten. No nausea, vomiting. Feels constipated. States he has missed his medications for a few days. Upon admission he complains that he is here because his house is "full of microplastics" and that he cannot breathe.   Of note pt was last admitted from 10/19/22-10/21/22 with numerous somatic complaints and was fixated on symptoms being related to his HVAC system and microfiberglass being inhaled. He was evaluated by cardiology, pulmonology, psychiatry. Had serial troponin, EKGs and echocardiogram that ruled out ACS cause of reported chest pain.  Pulmonology evaluated and recommended outpatient follow-up. Psychiatry made adjustments to his medications and offered social work resources but he refused. Pt noted by discharging physician to likely be malingering and would come up with new somatic complaints to avoid discharge.    Since his last admission he has been evaluated nearly daily in ED for various complaints without significant findings. Today he was found by EMS in the field laying in his yard stating things in his house were causing him shortness of breath. He then left AMA in ED prior to full evaluation but then checked back in later this evening. Upon returning he was notable more agitated and tachycardic with HR 170. Had to be sedated with Haldol and Ativan. EKG showed questionable atrial fibrillation that was reviewed by cardiology fellow overnight who was also unsure of EKG findings. He was also noted to  have AKI with increase anion gap. He has been placed under IVC by ED physician to complete therapy and evaluation. Hospitalist called for admission, psychiatry called for consult.  Patient is medically stable for discharge at this time, tentative plan to transition to inpatient psychiatry per discussion with psych once a bed has been made available.  Assessment & Plan:   Principal Problem:   AKI (acute kidney injury) ([place]) Active Problems:   Anxiety and depression   Elevated troponin   Metabolic acidosis   Altered mental status   AKI (acute kidney injury) ([place]), resolved -Creatinine is elevated to 1.40 from 0.81 with anion gap of 20 -Resolved with IV fluids, likely secondary to poor p.o. intake, prerenal in origin   Anxiety and depression, chronic -continue home buspirone, PRN hydroxyzine, mirtazapine, trazodone PRN -Patient indicates noncompliance with medications, concerned that he has been missing doses of his anxiety and depression medications likely exacerbating his mental status as below   Altered mental status -Pt has had numerous ED visits for various somatic complaints without significant finding.  -Patient continues to complain "knees and spine feels twisted and wants body-shaping therapy" he is without deformity pain or decreased range of motion, bilateral knee x-ray negative for any acute findings. -Patient was placed under IVC in the ED by ED physicians until patient can be evaluated by psychiatry.  We appreciate their insight and recommendations -Defer to psychiatry.  Unclear if patient's medication regimen needs to be adjusted or patient simply needs to be more compliant with previous regimen.  Regardless patient is ANO x 4 but  continues to confabulate and fixate on his "twisted knees" or his house being "full of micro plastics, so much so that I cannot see" -Discussed with patient's son who indicates patient was of sound mind and body last fall and has acutely changed over  the past 3 to 4 months without clear provocation.   Anion gap metabolic acidosis -Due to AKI and uremia, resolved with IV fluids.    Elevated troponin, likely supply/demand mismatch in setting of tachycardia and dehydration -EKG with sinus tacycardia difficult to discern if true atrial fibrillation.  -Initial troponin elevated, appears to be stable 60, 94, 90 -patient remains asymptomatic -UDS positive for benzos -of note patient is not on benzodiazepines in the outpatient setting but did receive lorazepam in the ED, unclear if UDS was collected before Ativan was administered -Last echocardiogram on 09/2022 with a EF of 60-65 with no regional wall motion abnormalities.  Normal diastolic parameters.  No significant valvular abnormality.  No indication to repeat this test  DVT prophylaxis: Lovenox Code Status: Full Family Communication: Discussed with son although somewhat estranged, he does want to be kept up-to-date on the patient's care but at this time is not willing to move forward with healthcare power of attorney or guardianship if that was something that was necessary.  Status is: Inpatient  Dispo: The patient is from: Home              Anticipated d/c is to: To be determined              Anticipated d/c date is: 24 to 48 hours              Patient currently not medically stable for discharge  Consultants:  Psychiatry  Procedures:  None  Antimicrobials:  None indicated  Subjective: No acute issues or events overnight, patient much more calm than previously, review of systems is positive nearly completely.  Patient indicates he has vision changes headache, nausea, itchy skin, tinnitus, decreased hearing, weakness, dysphagia, dizziness, and difficulty ambulating.  Objective: Vitals:   11/17/22 2200 11/17/22 2210 11/18/22 0129 11/18/22 0521  BP: 107/84 104/78 113/75 107/71  Pulse: (!) 137 88 62 63  Resp: (!) 23 (!) 21 18   Temp:   97.9 F (36.6 C) 98 F (36.7 C)  TempSrc:    Oral Axillary  SpO2: 97% 98% 100% 97%    Intake/Output Summary (Last 24 hours) at 11/18/2022 0716 Last data filed at 11/18/2022 0600 Gross per 24 hour  Intake 1333.96 ml  Output --  Net 1333.96 ml   There were no vitals filed for this visit.  Examination:  General:  Pleasantly resting in bed, No acute distress. HEENT:  Normocephalic atraumatic.  Sclerae nonicteric, noninjected.  Extraocular movements intact bilaterally. Neck:  Without mass or deformity.  Trachea is midline. Lungs:  Clear to auscultate bilaterally without rhonchi, wheeze, or rales. Heart:  Regular rate and rhythm.  Without murmurs, rubs, or gallops. Abdomen:  Soft, nontender, nondistended.  Without guarding or rebound. Extremities: Without cyanosis, clubbing, edema, or obvious deformity. Vascular:  Dorsalis pedis and posterior tibial pulses palpable bilaterally. Skin:  Warm and dry, no erythema, no ulcerations. Psych: Extremely poor insight and judgment  Data Reviewed: I have personally reviewed following labs and imaging studies  CBC: Recent Labs  Lab 11/12/22 1509 11/17/22 2127 11/18/22 0300  WBC 7.7 6.9 6.5  NEUTROABS 5.9 5.1  --   HGB 14.8 15.0 12.8*  HCT 42.1 43.2 37.3*  MCV 89.8 91.1 92.8  PLT 219 197 [id number]   Basic Metabolic Panel: Recent Labs  Lab 11/12/22 1509 11/17/22 2127 11/18/22 0300  NA 132* 136 137  K 3.8 3.2* 3.1*  CL 98 98 104  CO2 25 18* 24  GLUCOSE 121* 142* 121*  BUN 18 41* 40*  CREATININE 0.81 1.40* 0.86  CALCIUM 9.8 9.4 9.1  MG  --   --  2.3   GFR: Estimated Creatinine Clearance: 66.8 mL/min (by C-G formula based on SCr of 0.86 mg/dL).  No results found for this or any previous visit (from the past 240 hour(s)).   Radiology Studies: DG Knee 1-2 Views Right  Result Date: 11/18/2022 CLINICAL DATA:  Pain EXAM: RIGHT KNEE - 1-2 VIEW COMPARISON:  None Available. FINDINGS: No evidence of fracture, dislocation, or joint effusion. No evidence of arthropathy or other focal  bone abnormality. Soft tissues are unremarkable. IMPRESSION: Negative. Electronically Signed   By: Rolm Baptise M.D.   On: 11/18/2022 00:57   DG Knee 1-2 Views Left  Result Date: 11/18/2022 CLINICAL DATA:  Pain EXAM: LEFT KNEE - 1-2 VIEW COMPARISON:  None Available. FINDINGS: No evidence of fracture, dislocation, or joint effusion. No evidence of arthropathy or other focal bone abnormality. Soft tissues are unremarkable. IMPRESSION: Negative. Electronically Signed   By: Rolm Baptise M.D.   On: 11/18/2022 00:57   DG Chest 2 View  Result Date: 11/17/2022 CLINICAL DATA:  dyspnea EXAM: CHEST - 2 VIEW COMPARISON:  11/12/2022. FINDINGS: Cardiac silhouette is unremarkable. No pneumothorax or pleural effusion. The lungs are clear. Convex right lower thoracic scoliosis. IMPRESSION: No acute cardiopulmonary process. Electronically Signed   By: Sammie Bench M.D.   On: 11/17/2022 17:21    Scheduled Meds:  amLODipine  10 mg Oral Daily   [START ON 11/19/2022] atorvastatin  10 mg Oral Once per day on Mon Wed Fri   busPIRone  20 mg Oral TID   cholecalciferol  5,000 Units Oral Daily   docusate sodium  100 mg Oral Q12H   enoxaparin (LOVENOX) injection  40 mg Subcutaneous Q24H   LORazepam  1 mg Oral BID   mirtazapine  30 mg Oral QHS   potassium chloride  40 mEq Oral BID   pregabalin  75 mg Oral BID   zinc sulfate  220 mg Oral Daily   Continuous Infusions:  lactated ringers 75 mL/hr at 11/18/22 0129     LOS: 0 days   Time spent: 62min  Teonia Yager C Charika Mikelson, DO [facility]  If 7PM-7AM, please contact night-coverage [email or web address]  11/18/2022, 7:16 AM

## 2022-11-18 NOTE — Assessment & Plan Note (Signed)
-  continue home buspirone, PRN hydroxyzine, mirtazapine, trazodone PRN

## 2022-11-18 NOTE — TOC Initial Note (Signed)
Transition of Care [facility]) - Initial/Assessment Note    Patient Details  Name: Brad [name] MRN: [id number] Date of Birth: 06/14/1956  Transition of Care [facility]) CM/SW Contact:    Dessa Phi, RN Phone Number: 11/18/2022, 3:04 PM  Clinical Narrative:IVC eff 3/20-3/27 awaiting medical stability prior faxing out for IP Psych.                   Expected Discharge Plan: [facility] Hospital Barriers to Discharge: Continued Medical Work up   Patient Goals and CMS Choice            Expected Discharge Plan and Services                                              Prior Living Arrangements/Services                       Activities of Daily Living Home Assistive Devices/Equipment: Eyeglasses ADL Screening (condition at time of admission) Patient's cognitive ability adequate to safely complete daily activities?: Yes Is the patient deaf or have difficulty hearing?: No Does the patient have difficulty seeing, even when wearing glasses/contacts?: No Does the patient have difficulty concentrating, remembering, or making decisions?: Yes Patient able to express need for assistance with ADLs?: Yes Does the patient have difficulty dressing or bathing?: No Independently performs ADLs?: Yes (appropriate for developmental age) Does the patient have difficulty walking or climbing stairs?: No Weakness of Legs: Both Weakness of Arms/Hands: None  Permission Sought/Granted                  Emotional Assessment              Admission diagnosis:  Delusion ([place]) [F22] Elevated troponin [R79.89] AKI (acute kidney injury) ([place]) [N17.9] Patient Active Problem List   Diagnosis Date Noted   Altered mental status 11/18/2022   Protein-calorie malnutrition, severe 11/18/2022   Elevated troponin 11/17/2022   AKI (acute kidney injury) ([place]) [id number]   Metabolic acidosis [id number]   Reactive airway disease 10/29/2022   Dysphagia 10/29/2022   Atypical chest pain  10/20/2022   Essential hypertension 10/20/2022   Dyslipidemia 10/20/2022   Anxiety and depression 10/20/2022   Peripheral neuropathy 10/20/2022   Somatic complaints, multiple 10/20/2022   Generalized anxiety disorder 08/23/2022   Constipation, chronic 08/23/2022   Incarcerated left inguinal hernia 08/20/2022   Low back pain 03/08/2022   Neuropathic pain 08/07/2019   MDD (major depressive disorder) 02/27/2019   Paresthesias 06/28/2018   Inguinal hernia, left-repair [facility] Dec 2012 09/10/2011   PCP:  Patient, No Pcp Per Pharmacy:   [facility] [id number] - [place], [place] [address] [place] [address] Phone: [phone] Fax: [phone]     Social Determinants of Health (SDOH) Social History: SDOH Screenings   Food Insecurity: No Food Insecurity (10/20/2022)  Housing: Low Risk  (10/20/2022)  Transportation Needs: No Transportation Needs (10/20/2022)  Utilities: Not At Risk (10/20/2022)  Alcohol Screen: Low Risk  (02/27/2019)  Tobacco Use: Low Risk  (11/17/2022)   SDOH Interventions:     Readmission Risk Interventions    08/23/2022    9:38 AM  Readmission Risk Prevention Plan  Post Dischage Appt Complete  Medication Screening Complete  Transportation Screening Complete

## 2022-11-18 NOTE — Consult Note (Addendum)
[facility] Consult   Reason for Consult:  anxiety and somatic complaints  Referring Physician:  emergency room provider Patient Identification: Brad [name] MRN:  [id number] Principal Diagnosis: AKI (acute kidney injury) ([place]) Diagnosis:  Principal Problem:   AKI (acute kidney injury) ([place]) Active Problems:   Anxiety and depression   Elevated troponin   Metabolic acidosis   Altered mental status   Protein-calorie malnutrition, severe   Total Time spent with patient: 45 minutes  Subjective:   [name] is a 67 y.o. male patient admitted with chest discomfort. Patient initially presented with increase anxiety, and hyper focused, tangential in thoughts therefore psych consult was placed. Of note patient has had 29 ER visits in the past 3 months (12/31-today), multiple complaints to include anxiety, constipation, abdominal pain, somatic concerns, chronic pain syndrome, groin pain. He has been seen a total of 31 times in the ED in the past 6 months. He has been seen by neuro x 2, with no additional diagnoses being offered.   Brad [name] was seen and evaluated face-to-face by this provider.  Psychiatric consult was placed due to " delusions "excessive anxiety and psychosomatic symptoms."  Patient was asked the reason for this admission? "  I have been inhaling micro particles.  They are embedded in my skin.  They are everywhere on my car and my house.  My unit is very old, needs to be replaced.  "  When attempting to obtain additional information about delusions, patient became very guarded and resistant to treatment.  He then began to perseverate about " conversion disorder.  I need to make a complaint about the young lady from last night for Talking about conversion disorder.  I do not have conversion disorder nor do I have delusions.  That is a perception to you, but reality to me."  Patient did feel offended when inquired about ongoing psychiatric symptoms and psychosis.  He  denies any acute symptoms of mania at this time to include impulsivity, grandiosity, mood lability, hypersexuality.  He does not present with any of the above symptoms on this evaluation.He further denies any depressive symptoms to include anhedonia, hopelessness, worthlessness, guilty, suicidal.  He denies any acute psychosis, paranoia.  He does not appear to be displaying any or responding to internal stimuli, external stimuli.  He does present with delusional thought disorder as he is observed attempting to lift his gown to show writer the [place] particles that are embedded within his skin.  Patient denies any access to weapons, denies any alcohol and or substance abuse.  He reports moderate sleep and fair appetite.  Patient denies any auditory and/or visual hallucinations, does not appear to be responding to internal or external stimuli.  There is no evidence of delusional thought content and patient appears to answer all questions appropriately.     Patient endorses having a mental health diagnosis of depression and anxiety, current outpatient psychiatrist [name].  Patient reports that he currently sleeps outside due to the micro particles in his home and contaminated air that he has to breathe in.  On evaluation patient is alert and oriented x 4.  Patient is calm and cooperative, engages well with this psychiatric provider.  Patient's eye contact, speech, mood, all appear to be normal.  Patient continues to refute any suicidality at this time.  He further denies any recent suicidal thoughts, suicidal ideations, and or nonsuicidal self-injurious behavior.  Patient lacks insight and judgment, does not acknowledge his delusions and or  multiple somatic complaints that result in emergency room visits for issues that have not been present.    Collateral obtained from son Brad [name]: He reports years of irrational and erratic behaviors.  He states that his father is a Engineer, water with the healthcare  background, who is also married to a psychiatrist.  He describes his father as very intelligent, well-spoken, familiar with appropriate verbiage, and able to get around his psychiatric issues.  He states recently his father has spent in excess of $50,000 per ambulance visits.  He also reports his father has described microparticle was in his home, therefore is currently sleeping outside.  He reports this happened about 7 years ago in which patient was describing pink chips in the air.  He reports at that time his home was finished being renovated, and there were valid reasons for having pain chips however no one else is able to see the patient chips.  He states he is very concerned about his father's overall wellbeing from a psychiatric standpoint, and is pleading to get him psychiatric help.  HPI:   Per admission assessment note: "45 yom w/ hx as outlined below. Presents to ER 2/21 w/ cc: substernal chest discomfort, occasional dry cough, shortness of breath, burning in eyes and nose. States over last few weeks his AC has been malfunctioning leaking what he describes as fiberglass into air. States these symptoms are worse when he is at home and resolve when he is away (being the primary reason he keeps coming to the ER)  He was seen by cardiology who did not feel his chest pain was c/w ACS,. Because he has had repeated visits to the ER w/ similar complaints in addition to multiple others usually revolving around his prior hernia repair. Pulm was asked to see to assist with evaluation of his dyspnea. He most recently attempted to fly his real estate agent into the [place] from [place], to sell his property that he rents from. He was going to pay for her a ticket last night.  "     Past Psychiatric History: Anxiety, MDD, Insomnia.   Past psych medications: Buspar 20mg  po TID, Trazodone as needed, Ativan 1mg  po BID, mirtazapine 30  Risk to Self:  Yes delusional, poor insight Risk to Others:   Prior Inpatient  Therapy:   Prior Outpatient Therapy:    Past Medical History:  Past Medical History:  Diagnosis Date   Allergy    Anxiety    Chest pain    Depression    DJD (degenerative joint disease), cervical    postiton with pillow under knees, cant turn neck    Dysentery, amebic, acute 08/31/1979   GERD (gastroesophageal reflux disease)    H/O bronchitis    H/O malaria 08/30/1982   Hearing loss    bilateral    Hypertension    labile Blood pressure   Inguinal hernia    Insomnia    early morning awakening   MVP (mitral valve prolapse)    "no problems"   Perianal pain    Personal history of colonic polyps    Tinnitus    right ear    Past Surgical History:  Procedure Laterality Date   CARPAL TUNNEL RELEASE  10/06, 5/10   right wrist    CARPAL TUNNEL RELEASE  3/10   left wrist    cervical spine discectomy   09/2005   COLONOSCOPY WITH PROPOFOL N/A 10/21/2015   Procedure: COLONOSCOPY WITH PROPOFOL;  Surgeon: [name], MD;  Location: [facility];  Service: Endoscopy;  Laterality: N/A;   INGUINAL HERNIA REPAIR  08/16/2011   Procedure: HERNIA REPAIR INGUINAL ADULT;  Surgeon: [name], MD;  Location: [facility];  Service: General;  Laterality: Left;   INGUINAL HERNIA REPAIR Right 05/03/2014   Procedure: OPEN RIGHT INGUINAL HERNIA REPAIR WITH MESH;  Surgeon: [name], MD;  Location: [facility];  Service: General;  Laterality: Right;   INGUINAL HERNIA REPAIR Left 08/20/2022   Procedure: HERNIA REPAIR INGUINAL ADULT;  Surgeon: [name], MD;  Location: [facility];  Service: General;  Laterality: Left;   INSERTION OF MESH Right 05/03/2014   Procedure: INSERTION OF MESH;  Surgeon: [name], MD;  Location: [facility];  Service: General;  Laterality: Right;   TONSILLECTOMY  1960   Family History:  Family History  Problem Relation Age of Onset   Cancer Mother        breat cancer,lung cancer   Intracerebral hemorrhage Father    Family Psychiatric  History:  Social  History:  Social History   Substance and Sexual Activity  Alcohol Use Yes   Comment: 2 wine daily     Social History   Substance and Sexual Activity  Drug Use Not Currently   Types: Marijuana   Comment: weekend use    Social History   Socioeconomic History   Marital status: Married    Spouse name: Not on file   Number of children: 2   Years of education: Not on file   Highest education level: Professional school degree (e.g., MD, DDS, DVM, JD)  Occupational History   Occupation: psychologist  Tobacco Use   Smoking status: Never   Smokeless tobacco: Never  Vaping Use   Vaping Use: Never used  Substance and Sexual Activity   Alcohol use: Yes    Comment: 2 wine daily   Drug use: Not Currently    Types: Marijuana    Comment: weekend use   Sexual activity: Yes  Other Topics Concern   Not on file  Social History Narrative   Lives in single level home. He is a self employed Investment banker, operational.     Social Determinants of Health   Financial Resource Strain: Not on file  Food Insecurity: No Food Insecurity (10/20/2022)   Hunger Vital Sign    Worried About Running Out of Food in the Last Year: Never true    [place] in the Last Year: Never true  Transportation Needs: No Transportation Needs (10/20/2022)   PRAPARE - Hydrologist (Medical): No    Lack of Transportation (Non-Medical): No  Physical Activity: Not on file  Stress: Not on file  Social Connections: Not on file   Additional Social History:    Allergies:   Allergies  Allergen Reactions   Ciprofloxacin Other (See Comments)    Other Reaction(s): Dizziness (intolerance)   Citalopram Other (See Comments)   Duloxetine Other (See Comments)    Manic emotions  Other Reaction(s): Other (See Comments)    Manic emotions   Duloxetine Hcl Other (See Comments)    Manic emotions   Fluticasone-Umeclidin-Vilant Other (See Comments)    Dry cracked lips and mouth   Gabapentin  Other (See Comments)    Unable to urinate, drowsiness  Unable to urinate, drowsiness    Other Reaction(s): Other (See Comments)   Oxcarbazepine Other (See Comments)    Bad taste in his mouth  Other Reaction(s): Other (See Comments)  Bad taste in his mouth    Labs:  Results for orders placed or performed during the hospital encounter of 11/17/22 (from the past 48 hour(s))  CBC with Differential     Status: None   Collection Time: 11/17/22  9:27 PM  Result Value Ref Range   WBC 6.9 4.0 - 10.5 K/uL   RBC 4.74 4.22 - 5.81 MIL/uL   Hemoglobin 15.0 13.0 - 17.0 g/dL   HCT 43.2 39.0 - 52.0 %   MCV 91.1 80.0 - 100.0 fL   MCH 31.6 26.0 - 34.0 pg   MCHC 34.7 30.0 - 36.0 g/dL   RDW 12.7 11.5 - 15.5 %   Platelets 197 150 - 400 K/uL   nRBC 0.0 0.0 - 0.2 %   Neutrophils Relative % 74 %   Neutro Abs 5.1 1.7 - 7.7 K/uL   Lymphocytes Relative 18 %   Lymphs Abs 1.2 0.7 - 4.0 K/uL   Monocytes Relative 7 %   Monocytes Absolute 0.5 0.1 - 1.0 K/uL   Eosinophils Relative 0 %   Eosinophils Absolute 0.0 0.0 - 0.5 K/uL   Basophils Relative 1 %   Basophils Absolute 0.1 0.0 - 0.1 K/uL   Immature Granulocytes 0 %   Abs Immature Granulocytes 0.01 0.00 - 0.07 K/uL    Comment: Performed at [facility], [place] [address] [place], [place] [address]  Ethanol     Status: None   Collection Time: 11/17/22  9:27 PM  Result Value Ref Range   Alcohol, Ethyl (B) <10 <10 mg/dL    Comment: (NOTE) Lowest detectable limit for serum alcohol is 10 mg/dL.  For medical purposes only. Performed at [facility], [place] [address] [place], [place] [id number]   Basic metabolic panel     Status: Abnormal   Collection Time: 11/17/22  9:27 PM  Result Value Ref Range   Sodium 136 135 - 145 mmol/L   Potassium 3.2 (L) 3.5 - 5.1 mmol/L   Chloride 98 98 - 111 mmol/L   CO2 18 (L) 22 - 32 mmol/L   Glucose, Bld 142 (H) 70 - 99 mg/dL    Comment: Glucose reference range applies only to  samples taken after fasting for at least 8 hours.   BUN 41 (H) 8 - 23 mg/dL   Creatinine, Ser 1.40 (H) 0.61 - 1.24 mg/dL   Calcium 9.4 8.9 - 10.3 mg/dL   GFR, Estimated 55 (L) >60 mL/min    Comment: (NOTE) Calculated using the CKD-EPI Creatinine Equation (2021)    Anion gap 20 (H) 5 - 15    Comment: Performed at [facility], [place] [address], [place], [place] [address]  Acetaminophen level     Status: Abnormal   Collection Time: 11/17/22  9:27 PM  Result Value Ref Range   Acetaminophen (Tylenol), Serum <10 (L) 10 - 30 ug/mL    Comment: (NOTE) Therapeutic concentrations vary significantly. A range of 10-30 ug/mL  may be an effective concentration for many patients. However, some  are best treated at concentrations outside of this range. Acetaminophen concentrations >150 ug/mL at 4 hours after ingestion  and >50 ug/mL at 12 hours after ingestion are often associated with  toxic reactions.  Performed at [facility], [place] [address], [place], [place] [id number]   Salicylate level     Status: Abnormal   Collection Time: 11/17/22  9:27 PM  Result Value Ref Range   Salicylate Lvl Q000111Q (L) 7.0 -  30.0 mg/dL    Comment: Performed at [facility], [place] [address] [place], [place] [address]  Troponin I (High Sensitivity)     Status: Abnormal   Collection Time: 11/17/22  9:27 PM  Result Value Ref Range   Troponin I (High Sensitivity) 60 (H) <18 ng/L    Comment: (NOTE) Elevated high sensitivity troponin I (hsTnI) values and significant  changes across serial measurements may suggest ACS but many other  chronic and acute conditions are known to elevate hsTnI results.  Refer to the "Links" section for chest pain algorithms and additional  guidance. Performed at [facility], [place] [address], [place], [place] [address]   Troponin I (High Sensitivity)     Status: Abnormal   Collection Time: 11/17/22 11:19 PM  Result  Value Ref Range   Troponin I (High Sensitivity) 94 (H) <18 ng/L    Comment: DELTA CHECK NOTED (NOTE) Elevated high sensitivity troponin I (hsTnI) values and significant  changes across serial measurements may suggest ACS but many other  chronic and acute conditions are known to elevate hsTnI results.  Refer to the "Links" section for chest pain algorithms and additional  guidance. Performed at [facility], [place] [address], [place], [place] [address]   CBC     Status: Abnormal   Collection Time: 11/18/22  3:00 AM  Result Value Ref Range   WBC 6.5 4.0 - 10.5 K/uL   RBC 4.02 (L) 4.22 - 5.81 MIL/uL   Hemoglobin 12.8 (L) 13.0 - 17.0 g/dL   HCT 37.3 (L) 39.0 - 52.0 %   MCV 92.8 80.0 - 100.0 fL   MCH 31.8 26.0 - 34.0 pg   MCHC 34.3 30.0 - 36.0 g/dL   RDW 12.8 11.5 - 15.5 %   Platelets 184 150 - 400 K/uL   nRBC 0.0 0.0 - 0.2 %    Comment: Performed at [facility], [place] [address] [place], [place] [id number]  Basic metabolic panel     Status: Abnormal   Collection Time: 11/18/22  3:00 AM  Result Value Ref Range   Sodium 137 135 - 145 mmol/L   Potassium 3.1 (L) 3.5 - 5.1 mmol/L   Chloride 104 98 - 111 mmol/L   CO2 24 22 - 32 mmol/L   Glucose, Bld 121 (H) 70 - 99 mg/dL    Comment: Glucose reference range applies only to samples taken after fasting for at least 8 hours.   BUN 40 (H) 8 - 23 mg/dL   Creatinine, Ser 0.86 0.61 - 1.24 mg/dL   Calcium 9.1 8.9 - 10.3 mg/dL   GFR, Estimated >60 >60 mL/min    Comment: (NOTE) Calculated using the CKD-EPI Creatinine Equation (2021)    Anion gap 9 5 - 15    Comment: Performed at [facility], [place] [address], [place], [place] [address]  Troponin I (High Sensitivity)     Status: Abnormal   Collection Time: 11/18/22  3:00 AM  Result Value Ref Range   Troponin I (High Sensitivity) 90 (H) <18 ng/L    Comment: (NOTE) Elevated high sensitivity troponin I (hsTnI) values and significant   changes across serial measurements may suggest ACS but many other  chronic and acute conditions are known to elevate hsTnI results.  Refer to the "Links" section for chest pain algorithms and additional  guidance. Performed at [facility], [place] [address] [place], [place] [address]   Magnesium  Status: None   Collection Time: 11/18/22  3:00 AM  Result Value Ref Range   Magnesium 2.3 1.7 - 2.4 mg/dL    Comment: Performed at [facility], [place] [address] [place], [place] [address]  Urine rapid drug screen (hosp performed)     Status: Abnormal   Collection Time: 11/18/22  8:09 AM  Result Value Ref Range   Opiates NONE DETECTED NONE DETECTED   Cocaine NONE DETECTED NONE DETECTED   Benzodiazepines POSITIVE (A) NONE DETECTED   Amphetamines NONE DETECTED NONE DETECTED   Tetrahydrocannabinol NONE DETECTED NONE DETECTED   Barbiturates NONE DETECTED NONE DETECTED    Comment: (NOTE) DRUG SCREEN FOR MEDICAL PURPOSES ONLY.  IF CONFIRMATION IS NEEDED FOR ANY PURPOSE, NOTIFY LAB WITHIN 5 DAYS.  LOWEST DETECTABLE LIMITS FOR URINE DRUG SCREEN Drug Class                     Cutoff (ng/mL) Amphetamine and metabolites    1000 Barbiturate and metabolites    200 Benzodiazepine                 200 Opiates and metabolites        300 Cocaine and metabolites        300 THC                            50 Performed at [facility], [place] [address], [place], [place] [address]     Current Facility-Administered Medications  Medication Dose Route Frequency Provider Last Rate Last Admin   amLODipine (NORVASC) tablet 10 mg  10 mg Oral Daily Tu, Ching T, DO   10 mg at 11/18/22 0941   [START ON 11/19/2022] atorvastatin (LIPITOR) tablet 10 mg  10 mg Oral Once per day on Mon Wed Fri Tu, Ching T, DO       busPIRone (BUSPAR) tablet 20 mg  20 mg Oral TID Tu, Ching T, DO   20 mg at 11/18/22 0940   cholecalciferol (VITAMIN D3) 25 MCG (1000 UNIT) tablet  5,000 Units  5,000 Units Oral Daily Tu, Ching T, DO   5,000 Units at 11/18/22 0941   docusate sodium (COLACE) capsule 100 mg  100 mg Oral Q12H Tu, Ching T, DO   100 mg at 11/18/22 1118   enoxaparin (LOVENOX) injection 40 mg  40 mg Subcutaneous Q24H Tu, Ching T, DO   40 mg at 11/18/22 0941   feeding supplement (ENSURE ENLIVE / ENSURE PLUS) liquid 237 mL  237 mL Oral BID BM Little Ishikawa, MD   237 mL at 11/18/22 1118   hydrOXYzine (ATARAX) tablet 25 mg  25 mg Oral Q8H PRN Tu, Ching T, DO       LORazepam (ATIVAN) tablet 1 mg  1 mg Oral BID Tu, Ching T, DO   1 mg at 11/18/22 0940   mirtazapine (REMERON) tablet 30 mg  30 mg Oral QHS Tu, Ching T, DO   30 mg at 11/18/22 0158   multivitamin with minerals tablet 1 tablet  1 tablet Oral Daily Little Ishikawa, MD   1 tablet at 11/18/22 1118   potassium chloride SA (KLOR-CON M) CR tablet 40 mEq  40 mEq Oral BID Raenette Rover, NP   40 mEq at 11/18/22 0941   pregabalin (LYRICA) capsule 75 mg  75 mg Oral BID Tu, Ching T, DO   75 mg at 11/18/22 0940   traZODone (  DESYREL) tablet 50-100 mg  50-100 mg Oral QHS PRN Tu, Ching T, DO       zinc sulfate capsule 220 mg  220 mg Oral Daily Tu, Ching T, DO   220 mg at 11/18/22 0941   zolpidem (AMBIEN) tablet 5 mg  5 mg Oral QHS PRN,MR X 1 Tu, Ching T, DO        Musculoskeletal:  Patient observed resting in bed   Psychiatric Specialty Exam:  Presentation  General Appearance: Casual  Eye Contact:Good  Speech:Clear and Coherent  Speech Volume:Normal  Handedness:Right   Mood and Affect  Mood:Anxious  Affect:Congruent   Thought Process  Thought Processes:Coherent  Descriptions of Associations:Intact  Orientation:Full (Time, Place and Person)  Thought Content:Obsessions  History of Schizophrenia/Schizoaffective disorder:No data recorded Duration of Psychotic Symptoms:No data recorded Hallucinations:No data recorded  Ideas of Reference:None  Suicidal Thoughts:No data  recorded  Homicidal Thoughts:No data recorded   Sensorium  Memory:Immediate Fair; Remote Good  Judgment:Fair  Insight:Fair   Executive Functions  Concentration:Fair  Attention Span:Fair  [place]  Language:Good   Psychomotor Activity  Psychomotor Activity:No data recorded   Assets  Assets:Social Support   Sleep  Sleep:No data recorded   Physical Exam: Physical Exam Vitals and nursing note reviewed.  Cardiovascular:     Rate and Rhythm: Normal rate and regular rhythm.  Skin:    General: Skin is warm and dry.     Capillary Refill: Capillary refill takes less than 2 seconds.  Neurological:     General: No focal deficit present.     Mental Status: He is oriented to person, place, and time.  Psychiatric:        Mood and Affect: Mood normal.        Behavior: Behavior normal.        Thought Content: Thought content normal.    Review of Systems  Cardiovascular:  Positive for chest pain.  Psychiatric/Behavioral:  Positive for depression. Negative for suicidal ideas (passive ideations). The patient is nervous/anxious.   All other systems reviewed and are negative.  Blood pressure (!) 89/59, pulse 80, temperature 98 F (36.7 C), temperature source Axillary, resp. rate 18, SpO2 98 %. There is no height or weight on file to calculate BMI.  Treatment Plan Summary: Daily contact with patient to assess and evaluate symptoms and progress in treatment, Medication management, and Plan   -Continue current medications from home.  Continue as needed IM medication for agitation and delusions. -Labs reviewed at this time. Hypokalemia (3.1), elevated troponin (90). UDS positive benzodiazapine's.  EKG with normal QTC (466).  -Continue IVC and Air cabin crew.   Disposition: Meets inpatient psych criteria at this time.   Suella Broad, FNP 11/18/2022 3:32 PM

## 2022-11-18 NOTE — Assessment & Plan Note (Signed)
-  Pt has had numerous ED visits for various somatic complaints without significant finding. Today tells me his "knees and spine feels twisted and wants body-shaping therapy" but there are no deformities or pain on exam. Appears to have delusion thoughts about his body and symptoms.  -will still check bilateral knee X-ray to evaluate but warrants more psychiatric evaluation in the morning.  - ED Physician has placed him under IVC since he left AMA earlier and returned with new agitation requiring Haldol and Ativan

## 2022-11-18 NOTE — Progress Notes (Signed)
Unable to complete admission history. Pt is A/O X4 but answers questions inappropriately. Pt has son that may be able to assist in the AM.

## 2022-11-18 NOTE — Assessment & Plan Note (Signed)
-  secondary to AKI. Keep on continuous fluid overnight and monitor.

## 2022-11-18 NOTE — Assessment & Plan Note (Signed)
-  EKG with sinus tacycardia difficult to discern if true atrial fibrillation. Now back in normal sinus rhythm.  -keep on continuous telemetry overnight -presenting Troponin of 60 likely due to AKI and tachycardia. UDS is pending.  -will continue to trend serial troponin. Cardiology was consulted by ED physician and also recommended this.  -Last echocardiogram on 09/2022 with a EF of 60-65 with no regional wall motion abnormalities.  Normal diastolic parameters.  No significant valvular abnormality.

## 2022-11-18 NOTE — Progress Notes (Signed)
Initial Nutrition Assessment  DOCUMENTATION CODES:   Severe malnutrition in context of social or environmental circumstances, Underweight  INTERVENTION:   -Ensure Plus High Protein po BID, each supplement provides 350 kcal and 20 grams of protein.   -Multivitamin with minerals daily  -Re-weigh patient as able  NUTRITION DIAGNOSIS:   Severe Malnutrition related to social / environmental circumstances (lack of food storage options since December, AMS) as evidenced by moderate fat depletion, severe muscle depletion, percent weight loss, energy intake < or equal to 50% for > or equal to 1 month.  GOAL:   Patient will meet greater than or equal to 90% of their needs  MONITOR:   PO intake, Supplement acceptance, Weight trends, Labs, I & O's  REASON FOR ASSESSMENT:   Malnutrition Screening Tool    ASSESSMENT:   67 y.o. male with medical history significant of HTN, HLD, anxiety and depression who presents with complain of his "knees being twisted." Admitted with AKI and AMS.  Patient in room, sitter at bedside. Pt IVC. Reports his refrigerator stopped working in December 2023 and he has not been able to get anybody to fix it. We discussed shelf stable foods he can eat and protein sources. Pt has been consuming nuts, canned foods and fig newtons. Pt concerned about his weight loss and his nutrition status. He takes multiple vitamin/nutritional supplements: zinc, Vitamin D, MVI, fish oil, alpha-lipoic acid, CoQ10 and DHA.  Pt denies issues with swallowing. States he grinds his teeth so his teeth are sensitive. He has noticed hair loss and thinning. As well as vision changes, such as floaters.  Pt did not eat for 4 days PTA. Agreeable to receiving Ensure supplements, either strawberry or vanilla.  Pt reports UBW ~160 lbs. Per weight records, last weighed around 160 lbs in 2015. More recently pt has lost 16 lbs since 08/20/22 (11% wt loss x 3 months, significant for time frame).    Medications: Vitamin D, Colace, Remeron, KLOR-CON, Zinc sulfate   Labs reviewed: Low K  UDS+ benzos   NUTRITION - FOCUSED PHYSICAL EXAM:  Flowsheet Row Most Recent Value  Orbital Region Moderate depletion  Upper Arm Region Severe depletion  Thoracic and Lumbar Region Mild depletion  Buccal Region Moderate depletion  Temple Region Moderate depletion  Clavicle Bone Region Severe depletion  Clavicle and Acromion Bone Region Severe depletion  Scapular Bone Region Severe depletion  Dorsal Hand Moderate depletion  Patellar Region Severe depletion  Anterior Thigh Region Severe depletion  Posterior Calf Region Severe depletion  Edema (RD Assessment) None  Hair Reviewed  [thinning, reports hair loss]  Eyes Reviewed  [reports vision changes, floaters]  Mouth Reviewed  [reports grinds teeth]  Skin Reviewed  Nails Reviewed       Diet Order:   Diet Order             Diet regular Room service appropriate? Yes; Fluid consistency: Thin  Diet effective now                   EDUCATION NEEDS:   No education needs have been identified at this time  Skin:  Skin Assessment: Reviewed RN Assessment  Last BM:  PTA  Height:   Ht Readings from Last 1 Encounters:  11/17/22 5\' 9"  (1.753 m)    Weight:   Wt Readings from Last 1 Encounters:  11/17/22 56.7 kg    BMI:  18.3 kg/m^2  Estimated Nutritional Needs:   Kcal:  1750-1950  Protein:  85-100g  Fluid:  1.9L/day   Clayton Bibles, MS, RD, LDN Inpatient Clinical Dietitian Contact information available via Amion

## 2022-11-18 NOTE — Plan of Care (Signed)
  Problem: Activity: Goal: Ability to tolerate increased activity will improve Outcome: Progressing   Problem: Cardiac: Goal: Ability to achieve and maintain adequate cardiovascular perfusion will improve Outcome: Progressing   Problem: Clinical Measurements: Goal: Diagnostic test results will improve Outcome: Progressing   Problem: Safety: Goal: Ability to remain free from injury will improve Outcome: Progressing

## 2022-11-18 NOTE — Assessment & Plan Note (Signed)
-  Creatinine is elevated to 1.40 from 0.81 with anion gap of 20 -has received 500cc bolus in ED. Will keep on continuous IV fluid overnight

## 2022-11-18 NOTE — ED Notes (Signed)
ED TO INPATIENT HANDOFF REPORT  Name/Age/Gender Brad [name] 67 y.o. male  Code Status    Code Status Orders  (From admission, onward)           Start     Ordered   11/17/22 2354  Full code  Continuous       Question:  By:  Answer:  Consent: discussion documented in EHR   11/17/22 2354           Code Status History     Date Active Date Inactive Code Status Order ID Comments User Context   10/20/2022 0116 10/21/2022 2358 Full Code [id number]  Christel Mormon, MD ED   08/20/2022 0408 08/23/2022 1843 Full Code [id number]  Coralie Keens, MD ED   02/27/2019 1354 03/02/2019 1604 Full Code [id number]  Mordecai Maes, NP Inpatient   02/23/2019 0319 02/23/2019 0928 Full Code [id number]  Ripley Fraise, MD ED   02/05/2019 1451 02/06/2019 1406 Full Code [id number]  Rodney Booze, PA-C ED      Advance Directive Documentation    Flowsheet Row Most Recent Value  Type of Advance Directive Healthcare Power of Attorney  Pre-existing out of facility DNR order (yellow form or pink MOST form) --  "MOST" Form in Place? --       Home/SNF/Other Home  Chief Complaint AKI (acute kidney injury) ([place]) [N17.9]  Level of Care/Admitting Diagnosis ED Disposition     ED Disposition  Admit   Condition  --   [place]: [place] [[id number]]  Level of Care: Telemetry [5]  Admit to tele based on following criteria: Other see comments  Comments: rate  May place patient in observation at [facility] or [place] if equivalent level of care is available:: No  Covid Evaluation: Asymptomatic - no recent exposure (last 10 days) testing not required  Diagnosis: AKI (acute kidney injury) [facility]) [id number]  Admitting Physician: Orene Desanctis [id number]  Attending Physician: Orene Desanctis [id number]          Medical History Past Medical History:  Diagnosis Date   Allergy    Anxiety    Chest pain    Depression    DJD (degenerative joint disease), cervical     postiton with pillow under knees, cant turn neck    Dysentery, amebic, acute 08/31/1979   GERD (gastroesophageal reflux disease)    H/O bronchitis    H/O malaria 08/30/1982   Hearing loss    bilateral    Hypertension    labile Blood pressure   Inguinal hernia    Insomnia    early morning awakening   MVP (mitral valve prolapse)    "no problems"   Perianal pain    Personal history of colonic polyps    Tinnitus    right ear    Allergies Allergies  Allergen Reactions   Ciprofloxacin Other (See Comments)    Other Reaction(s): Dizziness (intolerance)   Citalopram Other (See Comments)   Duloxetine Other (See Comments)    Manic emotions  Other Reaction(s): Other (See Comments)    Manic emotions   Duloxetine Hcl Other (See Comments)    Manic emotions   Fluticasone-Umeclidin-Vilant Other (See Comments)    Dry cracked lips and mouth   Gabapentin Other (See Comments)    Unable to urinate, drowsiness  Unable to urinate, drowsiness    Other Reaction(s): Other (See Comments)   Oxcarbazepine Other (See Comments)    Bad taste in his mouth  Other Reaction(s):  Other (See Comments)    Bad taste in his mouth    IV Location/Drains/Wounds Patient Lines/Drains/Airways Status     Active Line/Drains/Airways     Name Placement date Placement time Site Days   Peripheral IV 11/17/22 20 G 1" Anterior;Right Forearm 11/17/22  2130  Forearm  1            Labs/Imaging Results for orders placed or performed during the hospital encounter of 11/17/22 (from the past 48 hour(s))  CBC with Differential     Status: None   Collection Time: 11/17/22  9:27 PM  Result Value Ref Range   WBC 6.9 4.0 - 10.5 K/uL   RBC 4.74 4.22 - 5.81 MIL/uL   Hemoglobin 15.0 13.0 - 17.0 g/dL   HCT 43.2 39.0 - 52.0 %   MCV 91.1 80.0 - 100.0 fL   MCH 31.6 26.0 - 34.0 pg   MCHC 34.7 30.0 - 36.0 g/dL   RDW 12.7 11.5 - 15.5 %   Platelets 197 150 - 400 K/uL   nRBC 0.0 0.0 - 0.2 %   Neutrophils Relative % 74  %   Neutro Abs 5.1 1.7 - 7.7 K/uL   Lymphocytes Relative 18 %   Lymphs Abs 1.2 0.7 - 4.0 K/uL   Monocytes Relative 7 %   Monocytes Absolute 0.5 0.1 - 1.0 K/uL   Eosinophils Relative 0 %   Eosinophils Absolute 0.0 0.0 - 0.5 K/uL   Basophils Relative 1 %   Basophils Absolute 0.1 0.0 - 0.1 K/uL   Immature Granulocytes 0 %   Abs Immature Granulocytes 0.01 0.00 - 0.07 K/uL    Comment: Performed at [facility], [place] [address], [place], [place] [address]  Ethanol     Status: None   Collection Time: 11/17/22  9:27 PM  Result Value Ref Range   Alcohol, Ethyl (B) <10 <10 mg/dL    Comment: (NOTE) Lowest detectable limit for serum alcohol is 10 mg/dL.  For medical purposes only. Performed at [facility], [place] [address], [place], [place] [id number]   Basic metabolic panel     Status: Abnormal   Collection Time: 11/17/22  9:27 PM  Result Value Ref Range   Sodium 136 135 - 145 mmol/L   Potassium 3.2 (L) 3.5 - 5.1 mmol/L   Chloride 98 98 - 111 mmol/L   CO2 18 (L) 22 - 32 mmol/L   Glucose, Bld 142 (H) 70 - 99 mg/dL    Comment: Glucose reference range applies only to samples taken after fasting for at least 8 hours.   BUN 41 (H) 8 - 23 mg/dL   Creatinine, Ser 1.40 (H) 0.61 - 1.24 mg/dL   Calcium 9.4 8.9 - 10.3 mg/dL   GFR, Estimated 55 (L) >60 mL/min    Comment: (NOTE) Calculated using the CKD-EPI Creatinine Equation (2021)    Anion gap 20 (H) 5 - 15    Comment: Performed at [facility], [place] [address] [place], [place] [address]  Acetaminophen level     Status: Abnormal   Collection Time: 11/17/22  9:27 PM  Result Value Ref Range   Acetaminophen (Tylenol), Serum <10 (L) 10 - 30 ug/mL    Comment: (NOTE) Therapeutic concentrations vary significantly. A range of 10-30 ug/mL  may be an effective concentration for many patients. However, some  are best treated at concentrations outside of this range. Acetaminophen concentrations  >150 ug/mL at 4 hours after ingestion  and >50 ug/mL at  12 hours after ingestion are often associated with  toxic reactions.  Performed at [facility], [place] [address] [place], [place] [id number]   Salicylate level     Status: Abnormal   Collection Time: 11/17/22  9:27 PM  Result Value Ref Range   Salicylate Lvl [id number] (L) 7.0 - 30.0 mg/dL    Comment: Performed at [facility], [place] [address], [place], [place] [address]  Troponin I (High Sensitivity)     Status: Abnormal   Collection Time: 11/17/22  9:27 PM  Result Value Ref Range   Troponin I (High Sensitivity) 60 (H) <18 ng/L    Comment: (NOTE) Elevated high sensitivity troponin I (hsTnI) values and significant  changes across serial measurements may suggest ACS but many other  chronic and acute conditions are known to elevate hsTnI results.  Refer to the "Links" section for chest pain algorithms and additional  guidance. Performed at [facility], [place] [address] [place], [place] [address]   Troponin I (High Sensitivity)     Status: Abnormal   Collection Time: 11/17/22 11:19 PM  Result Value Ref Range   Troponin I (High Sensitivity) 94 (H) <18 ng/L    Comment: DELTA CHECK NOTED (NOTE) Elevated high sensitivity troponin I (hsTnI) values and significant  changes across serial measurements may suggest ACS but many other  chronic and acute conditions are known to elevate hsTnI results.  Refer to the "Links" section for chest pain algorithms and additional  guidance. Performed at [facility], [place] [address], [place],  [address]    DG Chest 2 View  Result Date: 11/17/2022 CLINICAL DATA:  dyspnea EXAM: CHEST - 2 VIEW COMPARISON:  11/12/2022. FINDINGS: Cardiac silhouette is unremarkable. No pneumothorax or pleural effusion. The lungs are clear. Convex right lower thoracic scoliosis. IMPRESSION: No acute cardiopulmonary process. Electronically Signed    By: Sammie Bench M.D.   On: 11/17/2022 17:21    Pending Labs Unresulted Labs (From admission, onward)     Start     Ordered   11/18/22 0500  CBC  Tomorrow morning,   R        11/17/22 2354   11/18/22 [id number]  Basic metabolic panel  Tomorrow morning,   R        11/17/22 2354   11/17/22 2142  Urine rapid drug screen (hosp performed)  Once,   STAT        11/17/22 2141            Vitals/Pain Today's Vitals   11/17/22 2130 11/17/22 2145 11/17/22 2200 11/17/22 2210  BP: 115/89 95/76 107/84 104/78  Pulse: (!) 133 (!) 108 (!) 137 88  Resp: (!) 28 13 (!) 23 (!) 21  Temp:      TempSrc:      SpO2: 99% 99% 97% 98%  PainSc:        Isolation Precautions No active isolations  Medications Medications  enoxaparin (LOVENOX) injection 40 mg (has no administration in time range)  lactated ringers infusion (has no administration in time range)  amLODipine (NORVASC) tablet 10 mg (has no administration in time range)  atorvastatin (LIPITOR) tablet 10 mg (has no administration in time range)  busPIRone (BUSPAR) tablet 20 mg (has no administration in time range)  cholecalciferol (VITAMIN D3) 25 MCG (1000 UNIT) tablet 5,000 Units (has no administration in time range)  docusate sodium (COLACE) capsule 100 mg (has no administration in time range)  hydrOXYzine (ATARAX) tablet 25 mg (  has no administration in time range)  LORazepam (ATIVAN) tablet 1 mg (has no administration in time range)  mirtazapine (REMERON) tablet 30 mg (has no administration in time range)  pregabalin (LYRICA) capsule 75 mg (has no administration in time range)  traZODone (DESYREL) tablet 50-100 mg (has no administration in time range)  zolpidem (AMBIEN) tablet 5 mg (has no administration in time range)  zinc sulfate capsule 220 mg (has no administration in time range)  LORazepam (ATIVAN) injection 2 mg (2 mg Intramuscular Given 11/17/22 2111)  haloperidol lactate (HALDOL) injection 2.5 mg (2.5 mg Intramuscular Given  11/17/22 2135)  sodium chloride 0.9 % bolus 500 mL (500 mLs Intravenous Bolus 11/17/22 2317)  potassium chloride 10 mEq in 100 mL IVPB (10 mEq Intravenous New Bag/Given 11/17/22 2317)    Mobility walks with person assist

## 2022-11-18 NOTE — Care Management Obs Status (Signed)
[place] NOTIFICATION   Patient Details  Name: Brad [name] MRN: [id number] Date of Birth: 1956/05/28   Medicare Observation Status Notification Given:  Yes    Dessa Phi, RN 11/18/2022, 3:18 PM

## 2022-11-19 ENCOUNTER — Ambulatory Visit: Payer: Medicare Other | Admitting: Cardiovascular Disease

## 2022-11-19 DIAGNOSIS — N179 Acute kidney failure, unspecified: Secondary | ICD-10-CM | POA: Diagnosis not present

## 2022-11-19 DIAGNOSIS — F419 Anxiety disorder, unspecified: Secondary | ICD-10-CM | POA: Diagnosis not present

## 2022-11-19 LAB — BASIC METABOLIC PANEL
Anion gap: 5 (ref 5–15)
BUN: 25 mg/dL — ABNORMAL HIGH (ref 8–23)
CO2: 25 mmol/L (ref 22–32)
Calcium: 8.4 mg/dL — ABNORMAL LOW (ref 8.9–10.3)
Chloride: 105 mmol/L (ref 98–111)
Creatinine, Ser: 0.83 mg/dL (ref 0.61–1.24)
GFR, Estimated: >60 mL/min (ref >60)
Glucose, Bld: 105 mg/dL — ABNORMAL HIGH (ref 70–99)
Potassium: 4.2 mmol/L (ref 3.5–5.1)
Sodium: 135 mmol/L (ref 135–145)

## 2022-11-19 LAB — CBC
HCT: 34.6 % — ABNORMAL LOW (ref 39.0–52.0)
Hemoglobin: 11.9 g/dL — ABNORMAL LOW (ref 13.0–17.0)
MCH: 31.9 pg (ref 26.0–34.0)
MCHC: 34.4 g/dL (ref 30.0–36.0)
MCV: 92.8 fL (ref 80.0–100.0)
Platelets: 168 10*3/uL (ref 150–400)
RBC: 3.73 MIL/uL — ABNORMAL LOW (ref 4.22–5.81)
RDW: 13 % (ref 11.5–15.5)
WBC: 6.6 10*3/uL (ref 4.0–10.5)
nRBC: 0 % (ref 0.0–0.2)

## 2022-11-19 LAB — RESP PANEL BY RT-PCR (RSV, FLU A&B, COVID)  RVPGX2
Influenza A by PCR: NEGATIVE
Influenza B by PCR: NEGATIVE
Resp Syncytial Virus by PCR: NEGATIVE
SARS Coronavirus 2 by RT PCR: NEGATIVE

## 2022-11-19 NOTE — Progress Notes (Signed)
Inpatient Behavioral Health Placement  Pt meets inpatient criteria per Suella Broad, FNP. There are no available beds per [facility] Clayborne Dana, RN within the [place] system.  Referral was sent to the following facilities;    Destination  Service Provider Address Phone Fax  [facility]  [address] [place] [address] [phone]  [place]  [address] [place] [id number] [phone]  [facility]  [address] [place], [place] [address] [phone] [address] [place] [address] [phone]  [facility] [place] [address] [phone] [facility]  [id number] [place] [address] [phone] [facility]  [place] [address] [place] [address] [place]  [facility]  [address] [place] [address] [phone]  [facility]  [address], [place] [address] [phone]  [place] [address] [place] [address] [id number]  [facility]  [address] [place] [address] [phone] [place]  [address] [place] [address] [phone] [facility]  [address], [place] [address] [phone]  [facility]  [address] [place] [address] [phone] [facility]  [address] [place] [address] [phone] [facility]  [address] [place] [address] [phone] [place]  [place], [place]  [address] [place]  [facility]  [address] [place] [address] [phone]   Situation ongoing,  CSW will follow up.   Benjaman Kindler, MSW, [facility] 11/19/2022  @ 11:29 PM

## 2022-11-19 NOTE — TOC Progression Note (Signed)
Transition of Care [facility]) - Progression Note    Patient Details  Name: Brad [name] MRN: [id number] Date of Birth: [birth year]  [facility] CM/SW Contact  Gavriella Hearst, Juliann Pulse, RN Phone Number: 11/19/2022, 10:26 AM  Clinical Narrative: Faxed out-await bed offers for IP Psych.      Expected Discharge Plan: Psychiatric Hospital Barriers to Discharge: Continued Medical Work up  Expected Discharge Plan and Services                                               Social Determinants of Health (SDOH) Interventions SDOH Screenings   Food Insecurity: No Food Insecurity (10/20/2022)  Housing: Low Risk  (10/20/2022)  Transportation Needs: No Transportation Needs (10/20/2022)  Utilities: Not At Risk (10/20/2022)  Alcohol Screen: Low Risk  (02/27/2019)  Tobacco Use: Low Risk  (11/17/2022)    Readmission Risk Interventions    08/23/2022    9:38 AM  Readmission Risk Prevention Plan  Post Dischage Appt Complete  Medication Screening Complete  Transportation Screening Complete

## 2022-11-19 NOTE — Progress Notes (Signed)
PROGRESS NOTE    [name]  [id number] DOB: 1956/05/07 DOA: 11/17/2022 PCP: Patient, No Pcp Per   Brief Narrative:  Brad [name] is a 67 y.o. 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 Tells me he called EMS being his knees and spine were twisted and wants "body shaping" therapy. He just noticed it today. States he has not slept in 4 days or eaten. No nausea, vomiting. Feels constipated. States he has missed his medications for a few days. Upon admission he complains that he is here because his house is "full of microplastics" and that he cannot breathe.   Of note pt was last admitted from 10/19/22-10/21/22 with numerous somatic complaints and was fixated on symptoms being related to his HVAC system and microfiberglass being inhaled. He was evaluated by cardiology, pulmonology, psychiatry. Had serial troponin, EKGs and echocardiogram that ruled out ACS cause of reported chest pain.  Pulmonology evaluated and recommended outpatient follow-up. Psychiatry made adjustments to his medications and offered social work resources but he refused. Pt noted by discharging physician to likely be malingering and would come up with new somatic complaints to avoid discharge.    Since his last admission he has been evaluated nearly daily in ED for various complaints without significant findings. Today he was found by EMS in the field laying in his yard stating things in his house were causing him shortness of breath. He then left AMA in ED prior to full evaluation but then checked back in later this evening. Upon returning he was notable more agitated and tachycardic with HR 170. Had to be sedated with Haldol and Ativan. EKG showed questionable atrial fibrillation that was reviewed by cardiology fellow overnight who was also unsure of EKG findings. He was also noted to  have AKI with increase anion gap. He has been placed under IVC by ED physician to complete therapy and evaluation. Hospitalist called for admission, psychiatry called for consult.  Patient is medically stable for discharge at this time, tentative plan to transition to inpatient psychiatry per discussion with psych once a bed has been made available.  Assessment & Plan:   Principal Problem:   AKI (acute kidney injury) ([place]) Active Problems:   Anxiety and depression   Elevated troponin   Metabolic acidosis   Altered mental status   Protein-calorie malnutrition, severe   AKI (acute kidney injury) ([place]), resolved -Creatinine is elevated to 1.40 from 0.81 with anion gap of 20 -Resolved with IV fluids, likely secondary to poor p.o. intake, prerenal in origin   Anxiety and depression, chronic -continue home buspirone, PRN hydroxyzine, mirtazapine, trazodone PRN -Patient indicates noncompliance with medications, concerned that he has been missing doses of his anxiety and depression medications likely exacerbating his mental status as below   Altered mental status -Pt has had numerous ED visits for various somatic complaints without significant finding.  -Patient continues to complain "knees and spine feels twisted and wants body-shaping therapy" he is without deformity pain or decreased range of motion, bilateral knee x-ray negative for any acute findings. -Patient was placed under IVC in the ED by ED physicians until patient can be evaluated by psychiatry.  We appreciate their insight and recommendations -Defer to psychiatry.  Unclear if patient's medication regimen needs to be adjusted or patient simply needs to be more compliant with previous regimen.  Regardless patient  is ANO x 4 but continues to confabulate and fixate on his "twisted knees" or his house being "full of micro plastics, so much so that I cannot see" -Discussed with patient's son who indicates patient was of sound mind and  body last fall and has acutely changed over the past 3 to 4 months without clear provocation.   Anion gap metabolic acidosis -Due to AKI and uremia, resolved with IV fluids.    Elevated troponin, likely supply/demand mismatch in setting of tachycardia and dehydration -EKG with sinus tacycardia difficult to discern if true atrial fibrillation.  -Initial troponin elevated, appears to be stable 60, 94, 90 -patient remains asymptomatic -UDS positive for benzos -of note patient is not on benzodiazepines in the outpatient setting but did receive lorazepam in the ED, unclear if UDS was collected before Ativan was administered -Last echocardiogram on 09/2022 with a EF of 60-65 with no regional wall motion abnormalities.  Normal diastolic parameters.  No significant valvular abnormality.  No indication to repeat this test  DVT prophylaxis: Lovenox Code Status: Full Family Communication: Discussed with son although somewhat estranged, he does want to be kept up-to-date on the patient's care but at this time is not willing to move forward with healthcare power of attorney or guardianship if that was something that was necessary.  Status is: Inpatient  Dispo: The patient is from: Home              Anticipated d/c is to: To be determined              Anticipated d/c date is: 24 to 48 hours              Patient currently not medically stable for discharge  Consultants:  Psychiatry  Procedures:  None  Antimicrobials:  None indicated  Subjective: No acute issues or events overnight, patient agitated this morning complaining of "my bones moving out of place" restart up out of bed and did full skeletal exam with patient explaining that other than mild scoliosis his skeleton is symmetrical and appropriate.  Patient is fixated on repeat imaging which we discussed was inappropriate.  Patient also is confused about his disposition to inpatient psychiatry which we discussed again at length with the patient  given our concern over his safety.  Objective: Vitals:   11/18/22 2238 11/18/22 2300 11/19/22 0541 11/19/22 1227  BP: 102/68  120/72 114/72  Pulse: 75  64 74  Resp: 16  19 17   Temp: 98.2 F (36.8 C)  98.4 F (36.9 C) 98.1 F (36.7 C)  TempSrc: Oral  Oral Oral  SpO2: 97%  98% 100%  Weight:  58.9 kg    Height:  5\' 9"  (1.753 m)      Intake/Output Summary (Last 24 hours) at 11/19/2022 1502 Last data filed at 11/19/2022 0800 Gross per 24 hour  Intake 598 ml  Output --  Net 598 ml    Filed Weights   11/18/22 2300  Weight: 58.9 kg    Examination:  General:  Pleasantly resting in bed, No acute distress.  Somewhat cachectic in appearance. HEENT:  Normocephalic atraumatic.  Sclerae nonicteric, noninjected.  Extraocular movements intact bilaterally. Neck:  Without mass or deformity.  Trachea is midline. Lungs:  Clear to auscultate bilaterally without rhonchi, wheeze, or rales. Heart:  Regular rate and rhythm.  Without murmurs, rubs, or gallops. Abdomen:  Soft, nontender, nondistended.  Without guarding or rebound. Extremities: Without cyanosis, clubbing, edema, or obvious deformity. Vascular:  Dorsalis pedis and  posterior tibial pulses palpable bilaterally. Skin:  Warm and dry, no erythema  Skeletal exam: Full skeletal exam remarkable only for mild scoliosis of the lumbar region, otherwise unremarkable.  Data Reviewed: I have personally reviewed following labs and imaging studies  CBC: Recent Labs  Lab 11/12/22 1509 11/17/22 2127 11/18/22 0300 11/19/22 0516  WBC 7.7 6.9 6.5 6.6  NEUTROABS 5.9 5.1  --   --   HGB 14.8 15.0 12.8* 11.9*  HCT 42.1 43.2 37.3* 34.6*  MCV 89.8 91.1 92.8 92.8  PLT 219 197 184 [id number]    Basic Metabolic Panel: Recent Labs  Lab 11/12/22 1509 11/17/22 2127 11/18/22 0300 11/19/22 0516  NA 132* 136 137 135  K 3.8 3.2* 3.1* 4.2  CL 98 98 104 105  CO2 25 18* 24 25  GLUCOSE 121* 142* 121* 105*  BUN 18 41* 40* 25*  CREATININE 0.81 1.40* 0.86  0.83  CALCIUM 9.8 9.4 9.1 8.4*  MG  --   --  2.3  --     GFR: Estimated Creatinine Clearance: 71.9 mL/min (by C-G formula based on SCr of 0.83 mg/dL).  Recent Results (from the past 240 hour(s))  Resp panel by RT-PCR (RSV, Flu A&B, Covid) Anterior Nasal Swab     Status: None   Collection Time: 11/19/22  8:09 AM   Specimen: Anterior Nasal Swab  Result Value Ref Range Status   SARS Coronavirus 2 by RT PCR NEGATIVE NEGATIVE Final    Comment: (NOTE) SARS-CoV-2 target nucleic acids are NOT DETECTED.  The SARS-CoV-2 RNA is generally detectable in upper respiratory specimens during the acute phase of infection. The lowest concentration of SARS-CoV-2 viral copies this assay can detect is 138 copies/mL. A negative result does not preclude SARS-Cov-2 infection and should not be used as the sole basis for treatment or other patient management decisions. A negative result may occur with  improper specimen collection/handling, submission of specimen other than nasopharyngeal swab, presence of viral mutation(s) within the areas targeted by this assay, and inadequate number of viral copies(<138 copies/mL). A negative result must be combined with clinical observations, patient history, and epidemiological information. The expected result is Negative.  Fact Sheet for Patients:  EntrepreneurPulse.com.au  Fact Sheet for Healthcare Providers:  IncredibleEmployment.be  This test is no t yet approved or cleared by the [place] FDA and  has been authorized for detection and/or diagnosis of SARS-CoV-2 by FDA under an Emergency Use Authorization (EUA). This EUA will remain  in effect (meaning this test can be used) for the duration of the COVID-19 declaration under Section 564(b)(1) of the Act, 21 U.S.C.section 360bbb-3(b)(1), unless the authorization is terminated  or revoked sooner.       Influenza A by PCR NEGATIVE NEGATIVE Final   Influenza B by PCR  NEGATIVE NEGATIVE Final    Comment: (NOTE) The Xpert Xpress SARS-CoV-2/FLU/RSV plus assay is intended as an aid in the diagnosis of influenza from Nasopharyngeal swab specimens and should not be used as a sole basis for treatment. Nasal washings and aspirates are unacceptable for Xpert Xpress SARS-CoV-2/FLU/RSV testing.  Fact Sheet for Patients: EntrepreneurPulse.com.au  Fact Sheet for Healthcare Providers: IncredibleEmployment.be  This test is not yet approved or cleared by the [place] FDA and has been authorized for detection and/or diagnosis of SARS-CoV-2 by FDA under an Emergency Use Authorization (EUA). This EUA will remain in effect (meaning this test can be used) for the duration of the COVID-19 declaration under Section 564(b)(1) of the Act, 21 U.S.C. section  360bbb-3(b)(1), unless the authorization is terminated or revoked.     Resp Syncytial Virus by PCR NEGATIVE NEGATIVE Final    Comment: (NOTE) Fact Sheet for Patients: EntrepreneurPulse.com.au  Fact Sheet for Healthcare Providers: IncredibleEmployment.be  This test is not yet approved or cleared by the [place] FDA and has been authorized for detection and/or diagnosis of SARS-CoV-2 by FDA under an Emergency Use Authorization (EUA). This EUA will remain in effect (meaning this test can be used) for the duration of the COVID-19 declaration under Section 564(b)(1) of the Act, 21 U.S.C. section 360bbb-3(b)(1), unless the authorization is terminated or revoked.  Performed at [facility], [place] [address], [place], [place] [address]      Radiology Studies: DG Knee 1-2 Views Right  Result Date: 11/18/2022 CLINICAL DATA:  Pain EXAM: RIGHT KNEE - 1-2 VIEW COMPARISON:  None Available. FINDINGS: No evidence of fracture, dislocation, or joint effusion. No evidence of arthropathy or other focal bone abnormality. Soft  tissues are unremarkable. IMPRESSION: Negative. Electronically Signed   By: Rolm Baptise M.D.   On: 11/18/2022 00:57   DG Knee 1-2 Views Left  Result Date: 11/18/2022 CLINICAL DATA:  Pain EXAM: LEFT KNEE - 1-2 VIEW COMPARISON:  None Available. FINDINGS: No evidence of fracture, dislocation, or joint effusion. No evidence of arthropathy or other focal bone abnormality. Soft tissues are unremarkable. IMPRESSION: Negative. Electronically Signed   By: Rolm Baptise M.D.   On: 11/18/2022 00:57   DG Chest 2 View  Result Date: 11/17/2022 CLINICAL DATA:  dyspnea EXAM: CHEST - 2 VIEW COMPARISON:  11/12/2022. FINDINGS: Cardiac silhouette is unremarkable. No pneumothorax or pleural effusion. The lungs are clear. Convex right lower thoracic scoliosis. IMPRESSION: No acute cardiopulmonary process. Electronically Signed   By: Sammie Bench M.D.   On: 11/17/2022 17:21    Scheduled Meds:  amLODipine  10 mg Oral Daily   atorvastatin  10 mg Oral Once per day on Mon Wed Fri   busPIRone  20 mg Oral TID   cholecalciferol  5,000 Units Oral Daily   docusate sodium  100 mg Oral Q12H   enoxaparin (LOVENOX) injection  40 mg Subcutaneous Q24H   feeding supplement  237 mL Oral BID BM   LORazepam  1 mg Oral BID   mirtazapine  30 mg Oral QHS   multivitamin with minerals  1 tablet Oral Daily   potassium chloride  40 mEq Oral BID   pregabalin  75 mg Oral BID   zinc sulfate  220 mg Oral Daily   Continuous Infusions:     LOS: 1 day   Time spent: 86min  Josmar Messimer C Rudy Domek, DO [facility]  If 7PM-7AM, please contact night-coverage [email or web address]  11/19/2022, 3:02 PM

## 2022-11-19 NOTE — Progress Notes (Signed)
Received report from previous nurse. Agree with previous assessment. Patient in NAD, sitter at bedside, call light within reach, and bed alarm engaged. Will continue with plan of care.

## 2022-11-19 NOTE — Discharge Summary (Signed)
Physician Discharge Summary  Brad [name] [facility] [id number] DOB: Dec 24, 1955 DOA: 11/17/2022  PCP: Patient, No Pcp Per  Admit date: 11/17/2022 Discharge date: 11/20/2022  Admitted From: Home Disposition:  Inpt psych  Discharge Condition:stable  CODE STATUS:Full  Diet recommendation:  as tolerated  Brief/Interim Summary: Brad [name] is a 67 y.o. 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 Tells me he called EMS being his knees and spine were twisted and wants "body shaping" therapy. He just noticed it today. States he has not slept in 4 days or eaten. No nausea, vomiting. Feels constipated. States he has missed his medications for a few days. Upon admission he complains that he is here because his house is "full of microplastics" and that he cannot breathe.   Of note pt was last admitted from 10/19/22-10/21/22 with numerous somatic complaints and was fixated on symptoms being related to his HVAC system and microfiberglass being inhaled. He was evaluated by cardiology, pulmonology, psychiatry. Had serial troponin, EKGs and echocardiogram that ruled out ACS cause of reported chest pain.  Pulmonology evaluated and recommended outpatient follow-up. Psychiatry made adjustments to his medications and offered social work resources but he refused. Pt noted by discharging physician to likely be malingering and would come up with new somatic complaints to avoid discharge.    Since his last admission he has been evaluated nearly daily in ED for various complaints without significant findings. Today he was found by EMS in the field laying in his yard stating things in his house were causing him shortness of breath. He then left AMA in ED prior to full evaluation but then checked back in later this evening. Upon returning he was notable more agitated and tachycardic with HR  170. Had to be sedated with Haldol and Ativan. EKG showed questionable atrial fibrillation that was reviewed by cardiology fellow overnight who was also unsure of EKG findings. He was also noted to have AKI with increase anion gap. He has been placed under IVC by ED physician to complete therapy and evaluation. Hospitalist called for admission, psychiatry called for consult.   Patient is medically stable for discharge at this time, plan to transition to inpatient psychiatry per discussion with psych later today.  Discharge Diagnoses:  Principal Problem:   AKI (acute kidney injury) ([place]) Active Problems:   Anxiety and depression   Elevated troponin   Metabolic acidosis   Altered mental status   Protein-calorie malnutrition, severe  AKI (acute kidney injury) ([place]), resolved -Creatinine initially elevated to 1.40 from 0.81 with anion gap of 20 -Resolved with IV fluids, likely secondary to poor p.o. intake, prerenal in origin   Anxiety and depression, chronic -continue home buspirone, PRN hydroxyzine, mirtazapine, trazodone PRN -Patient indicates noncompliance with medications, concerned that he has been missing doses of his anxiety and depression medications likely exacerbating his mental status as below   Altered mental status, resolved -Pt has had numerous ED visits for various somatic complaints without significant finding.  -Patient continues to complain "knees and spine feels twisted and wants body-shaping therapy" he is without deformity pain or decreased range of motion, bilateral knee x-ray negative for any acute findings. -Patient was placed under IVC in the ED by ED physicians until patient can be evaluated by psychiatry.  We appreciate their insight and recommendations -Defer to psychiatry.  Unclear if patient's medication regimen needs  to be adjusted or patient simply needs to be more compliant with previous regimen.  Regardless patient is ANO x 4 but continues to confabulate and  fixate on his "twisted knees" or his house being "full of micro plastics, so much so that I cannot see" -Discussed with patient's son who indicates patient was of sound mind and body last fall and has acutely changed over the past 3 to 4 months without clear provocation.   Anion gap metabolic acidosis, resolved -Due to AKI and uremia, resolved with IV fluids.    Elevated troponin, likely supply/demand mismatch in setting of tachycardia and dehydration -EKG with sinus tacycardia difficult to discern if true atrial fibrillation.  -Initial troponin elevated, appears to be stable 60, 94, 90 -patient remains asymptomatic -UDS positive for benzos -of note patient is not on benzodiazepines in the outpatient setting but did receive lorazepam in the ED, unclear if UDS was collected before Ativan was administered -Last echocardiogram on 09/2022 with a EF of 60-65 with no regional wall motion abnormalities.  Normal diastolic parameters.  No significant valvular abnormality.  No indication to repeat this test  Discharge Instructions  Discharge Instructions     Discharge patient   Complete by: As directed    Discharge disposition: 65-Discharged/transferred to [facility] or [facility]   Discharge patient date: 11/20/2022      Allergies as of 11/20/2022       Reactions   Ciprofloxacin Other (See Comments)   Other Reaction(s): Dizziness (intolerance)   Citalopram Other (See Comments)   Duloxetine Other (See Comments)   Manic emotions Other Reaction(s): Other (See Comments)    Manic emotions   Duloxetine Hcl Other (See Comments)   Manic emotions   Fluticasone-umeclidin-vilant Other (See Comments)   Dry cracked lips and mouth   Gabapentin Other (See Comments)   Unable to urinate, drowsiness Unable to urinate, drowsiness    Other Reaction(s): Other (See Comments)   Oxcarbazepine Other (See Comments)   Bad taste in his mouth Other Reaction(s): Other  (See Comments)    Bad taste in his mouth        Medication List     TAKE these medications    Alpha-Lipoic Acid 600 MG Caps Take 600 mg by mouth daily.   amLODipine 5 MG tablet Commonly known as: NORVASC Take 2 tablets (10 mg total) by mouth daily.   atorvastatin 10 MG tablet Commonly known as: LIPITOR Take 10 mg by mouth 3 (three) times a week. MWF   BuSpar 10 MG tablet Generic drug: busPIRone Take 20 mg by mouth 3 (three) times daily.   Coenzyme Q10 50 MG Caps Take 50 mg by mouth daily.   cyclobenzaprine 10 MG tablet Commonly known as: FLEXERIL Take 1 tablet (10 mg total) by mouth 2 (two) times daily as needed for muscle spasms.   docusate sodium 100 MG capsule Commonly known as: COLACE Take 1 capsule (100 mg total) by mouth every 12 (twelve) hours.   hydrOXYzine 25 MG tablet Commonly known as: ATARAX Take 1 tablet (25 mg total) by mouth every 8 (eight) hours as needed.   LORazepam 1 MG tablet Commonly known as: ATIVAN Take 1 mg by mouth 2 (two) times daily.   losartan 100 MG tablet Commonly known as: COZAAR Take 100 mg by mouth daily.   meloxicam 7.5 MG tablet Commonly known as: MOBIC Take 7.5 mg by mouth daily.   mirtazapine 30 MG tablet Commonly known as: REMERON Take 30 mg  by mouth at bedtime. What changed: Another medication with the same name was removed. Continue taking this medication, and follow the directions you see here.   multivitamin tablet Take 1 tablet by mouth daily.   Omega-3 1000 MG Caps Take 1,000 mg by mouth daily.   polyethylene glycol 17 g packet Commonly known as: MiraLax Take 17 g by mouth 2 (two) times daily.   pregabalin 75 MG capsule Commonly known as: LYRICA Take 75 mg by mouth 2 (two) times daily.   senna-docusate 8.6-50 MG tablet Commonly known as: Senokot-S Take 1 tablet by mouth at bedtime as needed for mild constipation.   traZODone 50 MG tablet Commonly known as: DESYREL Take 50-100 mg by mouth at  bedtime as needed for sleep.   Vitamin D3 125 MCG (5000 UT) Tabs Take 5,000 Units by mouth daily.   zaleplon 10 MG capsule Commonly known as: SONATA Take 10-20 mg by mouth at bedtime as needed for sleep.   zinc gluconate 50 MG tablet Take 50 mg by mouth daily.        Allergies  Allergen Reactions   Ciprofloxacin Other (See Comments)    Other Reaction(s): Dizziness (intolerance)   Citalopram Other (See Comments)   Duloxetine Other (See Comments)    Manic emotions  Other Reaction(s): Other (See Comments)    Manic emotions   Duloxetine Hcl Other (See Comments)    Manic emotions   Fluticasone-Umeclidin-Vilant Other (See Comments)    Dry cracked lips and mouth   Gabapentin Other (See Comments)    Unable to urinate, drowsiness  Unable to urinate, drowsiness    Other Reaction(s): Other (See Comments)   Oxcarbazepine Other (See Comments)    Bad taste in his mouth  Other Reaction(s): Other (See Comments)    Bad taste in his mouth    Consultations: Psych  Procedures/Studies: DG Knee 1-2 Views Right  Result Date: 11/18/2022 CLINICAL DATA:  Pain EXAM: RIGHT KNEE - 1-2 VIEW COMPARISON:  None Available. FINDINGS: No evidence of fracture, dislocation, or joint effusion. No evidence of arthropathy or other focal bone abnormality. Soft tissues are unremarkable. IMPRESSION: Negative. Electronically Signed   By: Rolm Baptise M.D.   On: 11/18/2022 00:57   DG Knee 1-2 Views Left  Result Date: 11/18/2022 CLINICAL DATA:  Pain EXAM: LEFT KNEE - 1-2 VIEW COMPARISON:  None Available. FINDINGS: No evidence of fracture, dislocation, or joint effusion. No evidence of arthropathy or other focal bone abnormality. Soft tissues are unremarkable. IMPRESSION: Negative. Electronically Signed   By: Rolm Baptise M.D.   On: 11/18/2022 00:57   DG Chest 2 View  Result Date: 11/17/2022 CLINICAL DATA:  dyspnea EXAM: CHEST - 2 VIEW COMPARISON:  11/12/2022. FINDINGS: Cardiac silhouette is unremarkable.  No pneumothorax or pleural effusion. The lungs are clear. Convex right lower thoracic scoliosis. IMPRESSION: No acute cardiopulmonary process. Electronically Signed   By: Sammie Bench M.D.   On: 11/17/2022 17:21   DG Chest 1 View  Result Date: 11/12/2022 CLINICAL DATA:  Anxiety EXAM: CHEST  1 VIEW COMPARISON:  11/10/2022 FINDINGS: Single frontal view of the chest demonstrates a stable enlarged cardiac silhouette. No airspace disease, effusion, or pneumothorax. No acute bony abnormalities. Colonic interposition right upper quadrant. IMPRESSION: 1. Stable chest, no acute process. Electronically Signed   By: Randa Ngo M.D.   On: 11/12/2022 15:50   DG Chest 2 View  Result Date: 11/10/2022 CLINICAL DATA:  Provided history: Chest pain. Shortness of breath. EXAM: CHEST - 2 VIEW COMPARISON:  Prior chest radiographs 11/09/2022 and earlier. FINDINGS: Mild cardiomegaly, unchanged. Aortic atherosclerosis. No appreciable airspace consolidation or pulmonary edema. No evidence of pleural effusion or pneumothorax. Dextrocurvature and spondylosis of the thoracic spine. ACDF hardware. IMPRESSION: 1. No evidence of acute cardiopulmonary abnormality. 2. Mild cardiomegaly. 3.  Aortic Atherosclerosis (ICD10-I70.0). Electronically Signed   By: Kellie Simmering D.O.   On: 11/10/2022 10:39   DG Abd Acute W/Chest  Result Date: 11/09/2022 CLINICAL DATA:  Inguinal hernia with pain radiating to testicles, shortness of breath EXAM: DG ABDOMEN ACUTE WITH 1 VIEW CHEST COMPARISON:  10/30/2022 FINDINGS: Supine and upright frontal views of the abdomen and pelvis as well as an upright frontal view of the chest are obtained. Cardiac silhouette is stable. No acute airspace disease, effusion, or pneumothorax. No bowel obstruction or ileus. Minimal stool within the distal colon. There are no masses or abnormal calcifications. No free gas in the greater peritoneal sac. Stable colonic interposition right upper quadrant. No acute bony  abnormalities.  S shaped thoracolumbar scoliosis. IMPRESSION: 1. No acute intrathoracic process. 2. Unremarkable bowel gas pattern. Electronically Signed   By: Randa Ngo M.D.   On: 11/09/2022 18:03   DG Cervical Spine Complete  Result Date: 11/06/2022 CLINICAL DATA:  Woke up with neck pain after sleeping wrong EXAM: CERVICAL SPINE - COMPLETE 5 VIEW COMPARISON:  None Available. FINDINGS: Cervical spinal fixation hardware appears intact. There is no evidence of cervical spine fracture or prevertebral soft tissue swelling. Alignment is normal. Degenerative changes above the level of the cervical fixation spanning C3-5 characterized by anterior disc osteophytes and intervertebral disc space narrowing. Right right-greater-than-left neural foraminal narrowing at C3-4. IMPRESSION: 1. Cervical spinal fixation hardware appears intact. 2. Degenerative changes above the level of the cervical fixation spanning C3-5. 3. No fracture or malalignment. Electronically Signed   By: Darrin Nipper M.D.   On: 11/06/2022 18:10   DG Knee Complete 4 Views Right  Result Date: 10/30/2022 CLINICAL DATA:  Pain after injury EXAM: RIGHT KNEE - COMPLETE 4 VIEW COMPARISON:  None Available. FINDINGS: No acute fracture or dislocation. Preserved joint spaces and bone mineralization. No joint effusion. There is an ill-defined sclerotic area involving the lateral femoral condyle. There is a differential but this could represent an underlying chondroid lesion versus bone infarct. Please correlate with any prior study. Otherwise additional workup with MRI when clinically appropriate IMPRESSION: 1. No acute osseous injury of the right knee. 2. Ill-defined sclerotic area involving the lateral femoral condyle. This has a differential including a chondroid lesion or bone infarct. Please correlate for any known history or prior imaging. Otherwise MRI can be performed when clinically appropriate Electronically Signed   By: Jill Side M.D.   On:  10/30/2022 19:30   DG Chest 2 View  Result Date: 10/30/2022 CLINICAL DATA:  Chest pain EXAM: CHEST - 2 VIEW COMPARISON:  Chest x-ray and CT angiogram chest 10/19/2022 FINDINGS: Hyperinflation. No consolidation, pneumothorax or effusion. No edema. Borderline cardiopericardial silhouette. Films are rotated to the left. Curvature and degenerative changes along the spine. Fixation hardware along the lower cervical spine at the edge of the imaging field. The extreme right lung apex is clipped off the edge of the film. IMPRESSION: Hyperinflation.  No acute cardiopulmonary disease. Of note the extreme right lung apex is clipped off the edge of the film. Additional evaluation as clinically appropriate Electronically Signed   By: Jill Side M.D.   On: 10/30/2022 19:28     Subjective: No acute issues/events overnight  Discharge Exam: Vitals:   11/20/22 0500 11/20/22 1350  BP: 122/81 115/68  Pulse: 72 66  Resp:  16  Temp:    SpO2: 98%    Vitals:   11/19/22 1227 11/19/22 2020 11/20/22 0500 11/20/22 1350  BP: 114/72 118/78 122/81 115/68  Pulse: 74 71 72 66  Resp: 17   16  Temp: 98.1 F (36.7 C)     TempSrc: Oral     SpO2: 100% 100% 98%   Weight:      Height:        General: Pt is alert, awake, not in acute distress Cardiovascular: RRR, S1/S2 +, no rubs, no gallops Respiratory: CTA bilaterally, no wheezing, no rhonchi Abdominal: Soft, NT, ND, bowel sounds + Extremities: no edema, no cyanosis    The results of significant diagnostics from this hospitalization (including imaging, microbiology, ancillary and laboratory) are listed below for reference.     Microbiology: Recent Results (from the past 240 hour(s))  Resp panel by RT-PCR (RSV, Flu A&B, Covid) Anterior Nasal Swab     Status: None   Collection Time: 11/19/22  8:09 AM   Specimen: Anterior Nasal Swab  Result Value Ref Range Status   SARS Coronavirus 2 by RT PCR NEGATIVE NEGATIVE Final    Comment: (NOTE) SARS-CoV-2 target  nucleic acids are NOT DETECTED.  The SARS-CoV-2 RNA is generally detectable in upper respiratory specimens during the acute phase of infection. The lowest concentration of SARS-CoV-2 viral copies this assay can detect is 138 copies/mL. A negative result does not preclude SARS-Cov-2 infection and should not be used as the sole basis for treatment or other patient management decisions. A negative result may occur with  improper specimen collection/handling, submission of specimen other than nasopharyngeal swab, presence of viral mutation(s) within the areas targeted by this assay, and inadequate number of viral copies(<138 copies/mL). A negative result must be combined with clinical observations, patient history, and epidemiological information. The expected result is Negative.  Fact Sheet for Patients:  EntrepreneurPulse.com.au  Fact Sheet for Healthcare Providers:  IncredibleEmployment.be  This test is no t yet approved or cleared by the [place] FDA and  has been authorized for detection and/or diagnosis of SARS-CoV-2 by FDA under an Emergency Use Authorization (EUA). This EUA will remain  in effect (meaning this test can be used) for the duration of the COVID-19 declaration under Section 564(b)(1) of the Act, 21 U.S.C.section 360bbb-3(b)(1), unless the authorization is terminated  or revoked sooner.       Influenza A by PCR NEGATIVE NEGATIVE Final   Influenza B by PCR NEGATIVE NEGATIVE Final    Comment: (NOTE) The Xpert Xpress SARS-CoV-2/FLU/RSV plus assay is intended as an aid in the diagnosis of influenza from Nasopharyngeal swab specimens and should not be used as a sole basis for treatment. Nasal washings and aspirates are unacceptable for Xpert Xpress SARS-CoV-2/FLU/RSV testing.  Fact Sheet for Patients: EntrepreneurPulse.com.au  Fact Sheet for Healthcare  Providers: IncredibleEmployment.be  This test is not yet approved or cleared by the [place] FDA and has been authorized for detection and/or diagnosis of SARS-CoV-2 by FDA under an Emergency Use Authorization (EUA). This EUA will remain in effect (meaning this test can be used) for the duration of the COVID-19 declaration under Section 564(b)(1) of the Act, 21 U.S.C. section 360bbb-3(b)(1), unless the authorization is terminated or revoked.     Resp Syncytial Virus by PCR NEGATIVE NEGATIVE Final    Comment: (NOTE) Fact Sheet for Patients: EntrepreneurPulse.com.au  Fact Sheet  for Healthcare Providers: IncredibleEmployment.be  This test is not yet approved or cleared by the [place] and has been authorized for detection and/or diagnosis of SARS-CoV-2 by FDA under an Emergency Use Authorization (EUA). This EUA will remain in effect (meaning this test can be used) for the duration of the COVID-19 declaration under Section 564(b)(1) of the Act, 21 U.S.C. section 360bbb-3(b)(1), unless the authorization is terminated or revoked.  Performed at [facility], [place] [address], [place], [place] [address]      Labs: BNP (last 3 results) No results for input(s): "BNP" in the last 8760 hours. Basic Metabolic Panel: Recent Labs  Lab 11/17/22 2127 11/18/22 0300 11/19/22 0516  NA 136 137 135  K 3.2* 3.1* 4.2  CL 98 104 105  CO2 18* 24 25  GLUCOSE 142* 121* 105*  BUN 41* 40* 25*  CREATININE 1.40* 0.86 0.83  CALCIUM 9.4 9.1 8.4*  MG  --  2.3  --    CBC: Recent Labs  Lab 11/17/22 2127 11/18/22 0300 11/19/22 0516  WBC 6.9 6.5 6.6  NEUTROABS 5.1  --   --   HGB 15.0 12.8* 11.9*  HCT 43.2 37.3* 34.6*  MCV 91.1 92.8 92.8  PLT 197 184 168   Urinalysis    Component Value Date/Time   COLORURINE STRAW (A) 10/13/2022 1552   APPEARANCEUR CLEAR 10/13/2022 1552   LABSPEC 1.033 (H) 10/13/2022  1552   PHURINE 6.0 10/13/2022 1552   GLUCOSEU NEGATIVE 10/13/2022 1552   HGBUR NEGATIVE 10/13/2022 1552   BILIRUBINUR NEGATIVE 10/13/2022 1552   KETONESUR NEGATIVE 10/13/2022 1552   PROTEINUR NEGATIVE 10/13/2022 1552   UROBILINOGEN 0.2 02/01/2008 1313   NITRITE NEGATIVE 10/13/2022 1552   LEUKOCYTESUR NEGATIVE 10/13/2022 1552   Sepsis Labs Recent Labs  Lab 11/17/22 2127 11/18/22 0300 11/19/22 0516  WBC 6.9 6.5 6.6   Microbiology Recent Results (from the past 240 hour(s))  Resp panel by RT-PCR (RSV, Flu A&B, Covid) Anterior Nasal Swab     Status: None   Collection Time: 11/19/22  8:09 AM   Specimen: Anterior Nasal Swab  Result Value Ref Range Status   SARS Coronavirus 2 by RT PCR NEGATIVE NEGATIVE Final    Comment: (NOTE) SARS-CoV-2 target nucleic acids are NOT DETECTED.  The SARS-CoV-2 RNA is generally detectable in upper respiratory specimens during the acute phase of infection. The lowest concentration of SARS-CoV-2 viral copies this assay can detect is 138 copies/mL. A negative result does not preclude SARS-Cov-2 infection and should not be used as the sole basis for treatment or other patient management decisions. A negative result may occur with  improper specimen collection/handling, submission of specimen other than nasopharyngeal swab, presence of viral mutation(s) within the areas targeted by this assay, and inadequate number of viral copies(<138 copies/mL). A negative result must be combined with clinical observations, patient history, and epidemiological information. The expected result is Negative.  Fact Sheet for Patients:  EntrepreneurPulse.com.au  Fact Sheet for Healthcare Providers:  IncredibleEmployment.be  This test is no t yet approved or cleared by the [place] FDA and  has been authorized for detection and/or diagnosis of SARS-CoV-2 by FDA under an Emergency Use Authorization (EUA). This EUA will remain  in  effect (meaning this test can be used) for the duration of the COVID-19 declaration under Section 564(b)(1) of the Act, 21 U.S.C.section 360bbb-3(b)(1), unless the authorization is terminated  or revoked sooner.       Influenza A by PCR NEGATIVE NEGATIVE Final  Influenza B by PCR NEGATIVE NEGATIVE Final    Comment: (NOTE) The Xpert Xpress SARS-CoV-2/FLU/RSV plus assay is intended as an aid in the diagnosis of influenza from Nasopharyngeal swab specimens and should not be used as a sole basis for treatment. Nasal washings and aspirates are unacceptable for Xpert Xpress SARS-CoV-2/FLU/RSV testing.  Fact Sheet for Patients: EntrepreneurPulse.com.au  Fact Sheet for Healthcare Providers: IncredibleEmployment.be  This test is not yet approved or cleared by the [place] FDA and has been authorized for detection and/or diagnosis of SARS-CoV-2 by FDA under an Emergency Use Authorization (EUA). This EUA will remain in effect (meaning this test can be used) for the duration of the COVID-19 declaration under Section 564(b)(1) of the Act, 21 U.S.C. section 360bbb-3(b)(1), unless the authorization is terminated or revoked.     Resp Syncytial Virus by PCR NEGATIVE NEGATIVE Final    Comment: (NOTE) Fact Sheet for Patients: EntrepreneurPulse.com.au  Fact Sheet for Healthcare Providers: IncredibleEmployment.be  This test is not yet approved or cleared by the [place] FDA and has been authorized for detection and/or diagnosis of SARS-CoV-2 by FDA under an Emergency Use Authorization (EUA). This EUA will remain in effect (meaning this test can be used) for the duration of the COVID-19 declaration under Section 564(b)(1) of the Act, 21 U.S.C. section 360bbb-3(b)(1), unless the authorization is terminated or revoked.  Performed at [facility], [place] [address] [place], [place] [address]       Time coordinating discharge: Over 30 minutes  SIGNED:   Little Ishikawa, DO [facility] 11/20/2022, 2:28 PM Pager   If 7PM-7AM, please contact night-coverage [email or web address]

## 2022-11-19 NOTE — Progress Notes (Addendum)
Patient is complaining of not wanting to be admitted into inpt psych, not understanding why he has to go, and requesting to speak with someone about this. Very anxious, wanting to leave out of hospital. RN administered atarax at this time.

## 2022-11-19 NOTE — Consult Note (Signed)
[place] [facility]iatry Consult   Reason for Consult:  anxiety and somatic complaints  Referring Physician:  emergency room provider Patient Identification: Brad [name] MRN:  [id number] Principal Diagnosis: AKI (acute kidney injury) ([place]) Diagnosis:  Principal Problem:   AKI (acute kidney injury) ([place]) Active Problems:   Anxiety and depression   Elevated troponin   Metabolic acidosis   Altered mental status   Protein-calorie malnutrition, severe   Total Time spent with patient: 20 minutes  Subjective:   Brad [name] is a 67 y.o. male patient admitted with chest discomfort. Patient initially presented with increase anxiety, and hyper focused, tangential in thoughts therefore psych consult was placed. Of note patient has had 29 ER visits in the past 3 months (12/31-today), multiple complaints to include anxiety, constipation, abdominal pain, somatic concerns, chronic pain syndrome, groin pain. He has been seen a total of 31 times in the ED in the past 6 months. He has been seen by neuro x 2, with no additional diagnoses being offered.   Brad [name] is a 67 y.o. male is who has a psych history of Unspecified Psychosis, anxiety and depression. Patient presented to [facility] after presenting from home in which he slept outside due to the "microparticle in his home." Today on evaluation, he continues to endorse increase in back pain due to the "microparticles that have been embedded in my spine" He begins to stand up and walk around the room, rubbing his back and showing provider the "spot in which they entered".  Patient is able to participate in assessment appropriately. He is able to answer questions appropriately, however derails at times as he remains focused on the microparticles which he believes is being released from his HVAC and have taken over his home. He continues to lack insight into his illness and delusions, and becomes very guarded and defensive when questioned about this. He denies  SI/HI/AVH.   Alert and oriented. Presents with symptoms of mania and psychosis. The patient denies any difficulties with sleep, irritability, guilt, loss of energy, decrease in concentration, anhedonia, psychomotor retardation or suicidal ideations. At the conclusion of the evaluation, the patient voiced no other concerns.  Reviewed/ Summary of old records shows patient multiple ED visits and hospitalizations.   HPI:   Per admission assessment note: "60 yom w/ hx as outlined below. Presents to ER 2/21 w/ cc: substernal chest discomfort, occasional dry cough, shortness of breath, burning in eyes and nose. States over last few weeks his AC has been malfunctioning leaking what he describes as fiberglass into air. States these symptoms are worse when he is at home and resolve when he is away (being the primary reason he keeps coming to the ER)  He was seen by cardiology who did not feel his chest pain was c/w ACS,. Because he has had repeated visits to the ER w/ similar complaints in addition to multiple others usually revolving around his prior hernia repair. Pulm was asked to see to assist with evaluation of his dyspnea. He most recently attempted to fly his real estate agent into the [place] from [place], to sell his property that he rents from. He was going to pay for her a ticket last night.  "     Past Psychiatric History: Anxiety, MDD, Insomnia.   Past psych medications: Buspar 20mg  po TID, Trazodone as needed, Ativan 1mg  po BID, mirtazapine 30  Risk to Self:  Yes delusional, poor insight Risk to Others:   Prior Inpatient Therapy:  Prior Outpatient Therapy:    Past Medical History:  Past Medical History:  Diagnosis Date   Allergy    Anxiety    Chest pain    Depression    DJD (degenerative joint disease), cervical    postiton with pillow under knees, cant turn neck    Dysentery, amebic, acute 08/31/1979   GERD (gastroesophageal reflux disease)    H/O bronchitis    H/O malaria 08/30/1982    Hearing loss    bilateral    Hypertension    labile Blood pressure   Inguinal hernia    Insomnia    early morning awakening   MVP (mitral valve prolapse)    "no problems"   Perianal pain    Personal history of colonic polyps    Tinnitus    right ear    Past Surgical History:  Procedure Laterality Date   CARPAL TUNNEL RELEASE  10/06, 5/10   right wrist    CARPAL TUNNEL RELEASE  3/10   left wrist    cervical spine discectomy   09/2005   COLONOSCOPY WITH PROPOFOL N/A 10/21/2015   Procedure: COLONOSCOPY WITH PROPOFOL;  Surgeon: [name], MD;  Location: [facility];  Service: Endoscopy;  Laterality: N/A;   INGUINAL HERNIA REPAIR  08/16/2011   Procedure: HERNIA REPAIR INGUINAL ADULT;  Surgeon: [name], MD;  Location: [place];  Service: General;  Laterality: Left;   INGUINAL HERNIA REPAIR Right 05/03/2014   Procedure: OPEN RIGHT INGUINAL HERNIA REPAIR WITH MESH;  Surgeon: [name], MD;  Location: [facility];  Service: General;  Laterality: Right;   INGUINAL HERNIA REPAIR Left 08/20/2022   Procedure: HERNIA REPAIR INGUINAL ADULT;  Surgeon: [name], MD;  Location: [facility];  Service: General;  Laterality: Left;   INSERTION OF MESH Right 05/03/2014   Procedure: INSERTION OF MESH;  Surgeon: [name], MD;  Location: [facility];  Service: General;  Laterality: Right;   TONSILLECTOMY  1960   Family History:  Family History  Problem Relation Age of Onset   Cancer Mother        breat cancer,lung cancer   Intracerebral hemorrhage Father    Family Psychiatric  History:  Social History:  Social History   Substance and Sexual Activity  Alcohol Use Yes   Comment: 2 wine daily     Social History   Substance and Sexual Activity  Drug Use Not Currently   Types: Marijuana   Comment: weekend use    Social History   Socioeconomic History   Marital status: Married    Spouse name: Not on file   Number of children: 2   Years of education: Not on file    Highest education level: Professional school degree (e.g., MD, DDS, DVM, JD)  Occupational History   Occupation: psychologist  Tobacco Use   Smoking status: Never   Smokeless tobacco: Never  Vaping Use   Vaping Use: Never used  Substance and Sexual Activity   Alcohol use: Yes    Comment: 2 wine daily   Drug use: Not Currently    Types: Marijuana    Comment: weekend use   Sexual activity: Yes  Other Topics Concern   Not on file  Social History Narrative   Lives in single level home. He is a self employed Investment banker, operational.     Social Determinants of Health   Financial Resource Strain: Not on file  Food Insecurity: No Food Insecurity (10/20/2022)   Hunger Vital Sign  Worried About Charity fundraiser in the Last Year: Never true    [place] in the Last Year: Never true  Transportation Needs: No Transportation Needs (10/20/2022)   PRAPARE - Hydrologist (Medical): No    Lack of Transportation (Non-Medical): No  Physical Activity: Not on file  Stress: Not on file  Social Connections: Not on file   Additional Social History:    Allergies:   Allergies  Allergen Reactions   Ciprofloxacin Other (See Comments)    Other Reaction(s): Dizziness (intolerance)   Citalopram Other (See Comments)   Duloxetine Other (See Comments)    Manic emotions  Other Reaction(s): Other (See Comments)    Manic emotions   Duloxetine Hcl Other (See Comments)    Manic emotions   Fluticasone-Umeclidin-Vilant Other (See Comments)    Dry cracked lips and mouth   Gabapentin Other (See Comments)    Unable to urinate, drowsiness  Unable to urinate, drowsiness    Other Reaction(s): Other (See Comments)   Oxcarbazepine Other (See Comments)    Bad taste in his mouth  Other Reaction(s): Other (See Comments)    Bad taste in his mouth    Labs:  Results for orders placed or performed during the hospital encounter of 11/17/22 (from the past 48 hour(s))   CBC with Differential     Status: None   Collection Time: 11/17/22  9:27 PM  Result Value Ref Range   WBC 6.9 4.0 - 10.5 K/uL   RBC 4.74 4.22 - 5.81 MIL/uL   Hemoglobin 15.0 13.0 - 17.0 g/dL   HCT 43.2 39.0 - 52.0 %   MCV 91.1 80.0 - 100.0 fL   MCH 31.6 26.0 - 34.0 pg   MCHC 34.7 30.0 - 36.0 g/dL   RDW 12.7 11.5 - 15.5 %   Platelets 197 150 - 400 K/uL   nRBC 0.0 0.0 - 0.2 %   Neutrophils Relative % 74 %   Neutro Abs 5.1 1.7 - 7.7 K/uL   Lymphocytes Relative 18 %   Lymphs Abs 1.2 0.7 - 4.0 K/uL   Monocytes Relative 7 %   Monocytes Absolute 0.5 0.1 - 1.0 K/uL   Eosinophils Relative 0 %   Eosinophils Absolute 0.0 0.0 - 0.5 K/uL   Basophils Relative 1 %   Basophils Absolute 0.1 0.0 - 0.1 K/uL   Immature Granulocytes 0 %   Abs Immature Granulocytes 0.01 0.00 - 0.07 K/uL    Comment: Performed at [facility], [place] [address], [place], [place] [address]  Ethanol     Status: None   Collection Time: 11/17/22  9:27 PM  Result Value Ref Range   Alcohol, Ethyl (B) <10 <10 mg/dL    Comment: (NOTE) Lowest detectable limit for serum alcohol is 10 mg/dL.  For medical purposes only. Performed at [facility], [place] [address] [place], [place] [id number]   Basic metabolic panel     Status: Abnormal   Collection Time: 11/17/22  9:27 PM  Result Value Ref Range   Sodium 136 135 - 145 mmol/L   Potassium 3.2 (L) 3.5 - 5.1 mmol/L   Chloride 98 98 - 111 mmol/L   CO2 18 (L) 22 - 32 mmol/L   Glucose, Bld 142 (H) 70 - 99 mg/dL    Comment: Glucose reference range applies only to samples taken after fasting for at least 8 hours.   BUN 41 (H) 8 - 23 mg/dL  Creatinine, Ser 1.40 (H) 0.61 - 1.24 mg/dL   Calcium 9.4 8.9 - 10.3 mg/dL   GFR, Estimated 55 (L) >60 mL/min    Comment: (NOTE) Calculated using the CKD-EPI Creatinine Equation (2021)    Anion gap 20 (H) 5 - 15    Comment: Performed at [facility], [place] [address], [place], [place]  [address]  Acetaminophen level     Status: Abnormal   Collection Time: 11/17/22  9:27 PM  Result Value Ref Range   Acetaminophen (Tylenol), Serum <10 (L) 10 - 30 ug/mL    Comment: (NOTE) Therapeutic concentrations vary significantly. A range of 10-30 ug/mL  may be an effective concentration for many patients. However, some  are best treated at concentrations outside of this range. Acetaminophen concentrations >150 ug/mL at 4 hours after ingestion  and >50 ug/mL at 12 hours after ingestion are often associated with  toxic reactions.  Performed at [facility], [place] [address], [place], [place] [id number]   Salicylate level     Status: Abnormal   Collection Time: 11/17/22  9:27 PM  Result Value Ref Range   Salicylate Lvl [id number] (L) 7.0 - 30.0 mg/dL    Comment: Performed at [facility], [place] [address] [place], [place] [address]  Troponin I (High Sensitivity)     Status: Abnormal   Collection Time: 11/17/22  9:27 PM  Result Value Ref Range   Troponin I (High Sensitivity) 60 (H) <18 ng/L    Comment: (NOTE) Elevated high sensitivity troponin I (hsTnI) values and significant  changes across serial measurements may suggest ACS but many other  chronic and acute conditions are known to elevate hsTnI results.  Refer to the "Links" section for chest pain algorithms and additional  guidance. Performed at [facility], [place] [address], [place], [place] [address]   Troponin I (High Sensitivity)     Status: Abnormal   Collection Time: 11/17/22 11:19 PM  Result Value Ref Range   Troponin I (High Sensitivity) 94 (H) <18 ng/L    Comment: DELTA CHECK NOTED (NOTE) Elevated high sensitivity troponin I (hsTnI) values and significant  changes across serial measurements may suggest ACS but many other  chronic and acute conditions are known to elevate hsTnI results.  Refer to the "Links" section for chest pain algorithms and additional   guidance. Performed at [facility], [place] [address], [place], [place] [address]   CBC     Status: Abnormal   Collection Time: 11/18/22  3:00 AM  Result Value Ref Range   WBC 6.5 4.0 - 10.5 K/uL   RBC 4.02 (L) 4.22 - 5.81 MIL/uL   Hemoglobin 12.8 (L) 13.0 - 17.0 g/dL   HCT 37.3 (L) 39.0 - 52.0 %   MCV 92.8 80.0 - 100.0 fL   MCH 31.8 26.0 - 34.0 pg   MCHC 34.3 30.0 - 36.0 g/dL   RDW 12.8 11.5 - 15.5 %   Platelets 184 150 - 400 K/uL   nRBC 0.0 0.0 - 0.2 %    Comment: Performed at [facility], [place] [address], [place],  [id number]  Basic metabolic panel     Status: Abnormal   Collection Time: 11/18/22  3:00 AM  Result Value Ref Range   Sodium 137 135 - 145 mmol/L   Potassium 3.1 (L) 3.5 - 5.1 mmol/L   Chloride 104 98 - 111 mmol/L   CO2 24 22 - 32 mmol/L   Glucose,  Bld 121 (H) 70 - 99 mg/dL    Comment: Glucose reference range applies only to samples taken after fasting for at least 8 hours.   BUN 40 (H) 8 - 23 mg/dL   Creatinine, Ser 0.86 0.61 - 1.24 mg/dL   Calcium 9.1 8.9 - 10.3 mg/dL   GFR, Estimated >60 >60 mL/min    Comment: (NOTE) Calculated using the CKD-EPI Creatinine Equation (2021)    Anion gap 9 5 - 15    Comment: Performed at [facility], [place] [address] [place], [place] [address]  Troponin I (High Sensitivity)     Status: Abnormal   Collection Time: 11/18/22  3:00 AM  Result Value Ref Range   Troponin I (High Sensitivity) 90 (H) <18 ng/L    Comment: (NOTE) Elevated high sensitivity troponin I (hsTnI) values and significant  changes across serial measurements may suggest ACS but many other  chronic and acute conditions are known to elevate hsTnI results.  Refer to the "Links" section for chest pain algorithms and additional  guidance. Performed at [facility], [place] [address] [place], [place] [address]   Magnesium     Status: None   Collection Time: 11/18/22  3:00 AM  Result  Value Ref Range   Magnesium 2.3 1.7 - 2.4 mg/dL    Comment: Performed at [facility], [place] [address], [place], [place] [address]  Urine rapid drug screen (hosp performed)     Status: Abnormal   Collection Time: 11/18/22  8:09 AM  Result Value Ref Range   Opiates NONE DETECTED NONE DETECTED   Cocaine NONE DETECTED NONE DETECTED   Benzodiazepines POSITIVE (A) NONE DETECTED   Amphetamines NONE DETECTED NONE DETECTED   Tetrahydrocannabinol NONE DETECTED NONE DETECTED   Barbiturates NONE DETECTED NONE DETECTED    Comment: (NOTE) DRUG SCREEN FOR MEDICAL PURPOSES ONLY.  IF CONFIRMATION IS NEEDED FOR ANY PURPOSE, NOTIFY LAB WITHIN 5 DAYS.  LOWEST DETECTABLE LIMITS FOR URINE DRUG SCREEN Drug Class                     Cutoff (ng/mL) Amphetamine and metabolites    1000 Barbiturate and metabolites    200 Benzodiazepine                 200 Opiates and metabolites        300 Cocaine and metabolites        300 THC                            50 Performed at [facility], [place] [address] [place], [place] [address]   CBC     Status: Abnormal   Collection Time: 11/19/22  5:16 AM  Result Value Ref Range   WBC 6.6 4.0 - 10.5 K/uL   RBC 3.73 (L) 4.22 - 5.81 MIL/uL   Hemoglobin 11.9 (L) 13.0 - 17.0 g/dL   HCT 34.6 (L) 39.0 - 52.0 %   MCV 92.8 80.0 - 100.0 fL   MCH 31.9 26.0 - 34.0 pg   MCHC 34.4 30.0 - 36.0 g/dL   RDW 13.0 11.5 - 15.5 %   Platelets 168 150 - 400 K/uL   nRBC 0.0 0.0 - 0.2 %    Comment: Performed at [facility], [place] [address], [place],  [id number]  Basic metabolic panel     Status: Abnormal   Collection Time: 11/19/22  5:16 AM  Result Value Ref Range   Sodium 135 135 - 145 mmol/L   Potassium 4.2 3.5 - 5.1 mmol/L   Chloride 105 98 - 111 mmol/L   CO2 25 22 - 32 mmol/L   Glucose, Bld 105 (H) 70 - 99 mg/dL    Comment: Glucose reference range applies only to samples taken after fasting for at least 8 hours.    BUN 25 (H) 8 - 23 mg/dL   Creatinine, Ser 0.83 0.61 - 1.24 mg/dL   Calcium 8.4 (L) 8.9 - 10.3 mg/dL   GFR, Estimated >60 >60 mL/min    Comment: (NOTE) Calculated using the CKD-EPI Creatinine Equation (2021)    Anion gap 5 5 - 15    Comment: Performed at [facility], [place] [address], [place], [place] [address]  Resp panel by RT-PCR (RSV, Flu A&B, Covid) Anterior Nasal Swab     Status: None   Collection Time: 11/19/22  8:09 AM   Specimen: Anterior Nasal Swab  Result Value Ref Range   SARS Coronavirus 2 by RT PCR NEGATIVE NEGATIVE    Comment: (NOTE) SARS-CoV-2 target nucleic acids are NOT DETECTED.  The SARS-CoV-2 RNA is generally detectable in upper respiratory specimens during the acute phase of infection. The lowest concentration of SARS-CoV-2 viral copies this assay can detect is 138 copies/mL. A negative result does not preclude SARS-Cov-2 infection and should not be used as the sole basis for treatment or other patient management decisions. A negative result may occur with  improper specimen collection/handling, submission of specimen other than nasopharyngeal swab, presence of viral mutation(s) within the areas targeted by this assay, and inadequate number of viral copies(<138 copies/mL). A negative result must be combined with clinical observations, patient history, and epidemiological information. The expected result is Negative.  Fact Sheet for Patients:  EntrepreneurPulse.com.au  Fact Sheet for Healthcare Providers:  IncredibleEmployment.be  This test is no t yet approved or cleared by the [place] FDA and  has been authorized for detection and/or diagnosis of SARS-CoV-2 by FDA under an Emergency Use Authorization (EUA). This EUA will remain  in effect (meaning this test can be used) for the duration of the COVID-19 declaration under Section 564(b)(1) of the Act, 21 U.S.C.section 360bbb-3(b)(1), unless the  authorization is terminated  or revoked sooner.       Influenza A by PCR NEGATIVE NEGATIVE   Influenza B by PCR NEGATIVE NEGATIVE    Comment: (NOTE) The Xpert Xpress SARS-CoV-2/FLU/RSV plus assay is intended as an aid in the diagnosis of influenza from Nasopharyngeal swab specimens and should not be used as a sole basis for treatment. Nasal washings and aspirates are unacceptable for Xpert Xpress SARS-CoV-2/FLU/RSV testing.  Fact Sheet for Patients: EntrepreneurPulse.com.au  Fact Sheet for Healthcare Providers: IncredibleEmployment.be  This test is not yet approved or cleared by the [place] FDA and has been authorized for detection and/or diagnosis of SARS-CoV-2 by FDA under an Emergency Use Authorization (EUA). This EUA will remain in effect (meaning this test can be used) for the duration of the COVID-19 declaration under Section 564(b)(1) of the Act, 21 U.S.C. section 360bbb-3(b)(1), unless the authorization is terminated or revoked.     Resp Syncytial Virus by PCR NEGATIVE NEGATIVE    Comment: (NOTE) Fact Sheet for Patients: EntrepreneurPulse.com.au  Fact Sheet for Healthcare Providers: IncredibleEmployment.be  This test is not yet approved or cleared by the [place] FDA and has been authorized for detection and/or diagnosis of SARS-CoV-2 by FDA under  an Emergency Use Authorization (EUA). This EUA will remain in effect (meaning this test can be used) for the duration of the COVID-19 declaration under Section 564(b)(1) of the Act, 21 U.S.C. section 360bbb-3(b)(1), unless the authorization is terminated or revoked.  Performed at [facility], [place] [address] [place], [place] [address]     Current Facility-Administered Medications  Medication Dose Route Frequency Provider Last Rate Last Admin   amLODipine (NORVASC) tablet 10 mg  10 mg Oral Daily Tu, Ching T, DO   10  mg at 11/19/22 [id number]   atorvastatin (LIPITOR) tablet 10 mg  10 mg Oral Once per day on Mon Wed Fri Tu, Ching T, DO   10 mg at 11/19/22 [id number]   busPIRone (BUSPAR) tablet 20 mg  20 mg Oral TID Tu, Ching T, DO   20 mg at 11/19/22 1713   cholecalciferol (VITAMIN D3) 25 MCG (1000 UNIT) tablet 5,000 Units  5,000 Units Oral Daily Tu, Ching T, DO   5,000 Units at 11/19/22 0816   docusate sodium (COLACE) capsule 100 mg  100 mg Oral Q12H Tu, Ching T, DO   100 mg at 11/19/22 0816   enoxaparin (LOVENOX) injection 40 mg  40 mg Subcutaneous Q24H Tu, Ching T, DO   40 mg at 11/19/22 0817   feeding supplement (ENSURE ENLIVE / ENSURE PLUS) liquid 237 mL  237 mL Oral BID BM Little Ishikawa, MD   237 mL at 11/19/22 1350   haloperidol lactate (HALDOL) injection 2.5 mg  2.5 mg Intramuscular Once PRN Suella Broad, FNP       hydrOXYzine (ATARAX) tablet 25 mg  25 mg Oral Q8H PRN Tu, Ching T, DO   25 mg at 11/19/22 [id number]   LORazepam (ATIVAN) tablet 1 mg  1 mg Oral BID Tu, Ching T, DO   1 mg at 11/19/22 [id number]   mirtazapine (REMERON) tablet 30 mg  30 mg Oral QHS Tu, Ching T, DO   30 mg at 11/18/22 2132   multivitamin with minerals tablet 1 tablet  1 tablet Oral Daily Little Ishikawa, MD   1 tablet at 11/19/22 0816   potassium chloride SA (KLOR-CON M) CR tablet 40 mEq  40 mEq Oral BID Raenette Rover, NP   40 mEq at 11/19/22 0816   pregabalin (LYRICA) capsule 75 mg  75 mg Oral BID Tu, Ching T, DO   75 mg at 11/19/22 [id number]   traZODone (DESYREL) tablet 50-100 mg  50-100 mg Oral QHS PRN Tu, Ching T, DO       zinc sulfate capsule 220 mg  220 mg Oral Daily Tu, Ching T, DO   220 mg at 11/19/22 0816   zolpidem (AMBIEN) tablet 5 mg  5 mg Oral QHS PRN,MR X 1 Tu, Ching T, DO   5 mg at 11/18/22 2132    Musculoskeletal:  Patient observed resting in bed   Psychiatric Specialty Exam:  Presentation  General Appearance: Appropriate for Environment; Casual  Eye Contact:Good  Speech:Clear and Coherent; Normal  Rate  Speech Volume:Normal  Handedness:Right   Mood and Affect  Mood:Euthymic  Affect:Appropriate; Congruent   Thought Process  Thought Processes:Irrevelant  Descriptions of Associations:Intact  Orientation:Full (Time, Place and Person)  Thought Content:Paranoid Ideation; Perseveration; Tangential; Delusions  History of Schizophrenia/Schizoaffective disorder:Yes Duration of Psychotic Symptoms:Greater than six months Hallucinations:Hallucinations: None  Ideas of Reference:Delusions; Paranoia; Percusatory  Suicidal Thoughts:Suicidal Thoughts: No  Homicidal Thoughts:Homicidal Thoughts: No   Sensorium  Memory:Immediate Fair; Recent Fair; Remote Fair  Judgment:Fair  Insight:Fair   Executive Functions  Concentration:Fair  Attention Span:Good  Recall:Good  Fund of Knowledge:Good  Language:Fair   Psychomotor Activity  Psychomotor Activity:Psychomotor Activity: Normal   Assets  Assets:Social Support; Armed forces logistics/support/administrative officer; Desire for Improvement; Housing; Transportation; Chartered certified accountant; Leisure Time; Physical Health; Financial Resources/Insurance; Resilience; Talents/Skills   Sleep  Sleep:Sleep: Fair   Physical Exam: Physical Exam Vitals and nursing note reviewed.  Constitutional:      Appearance: He is well-developed and normal weight.  Cardiovascular:     Rate and Rhythm: Normal rate and regular rhythm.  Skin:    General: Skin is warm and dry.     Capillary Refill: Capillary refill takes less than 2 seconds.  Neurological:     General: No focal deficit present.     Mental Status: He is alert and oriented to person, place, and time.  Psychiatric:        Mood and Affect: Mood normal.        Behavior: Behavior normal.        Thought Content: Thought content normal.    Review of Systems  Cardiovascular:  Positive for chest pain.  Psychiatric/Behavioral:  Positive for depression. Negative for suicidal ideas (passive ideations). The  patient is nervous/anxious.   All other systems reviewed and are negative.  Blood pressure 114/72, pulse 74, temperature 98.1 F (36.7 C), temperature source Oral, resp. rate 17, height 5\' 9"  (1.753 m), weight 58.9 kg, SpO2 100 %. Body mass index is 19.18 kg/m.  Treatment Plan Summary: Daily contact with patient to assess and evaluate symptoms and progress in treatment, Medication management, and Plan   -Continue current medications from home.  Continue as needed IM medication for agitation and delusions. -Labs reviewed at this time. Hypokalemia (3.1), elevated troponin (90). UDS positive benzodiazapine's.  EKG with normal QTC (466).  -Continue IVC and Air cabin crew.   Disposition: Meets inpatient psych criteria at this time.   Suella Broad, FNP 11/19/2022 5:24 PM

## 2022-11-20 DIAGNOSIS — N179 Acute kidney failure, unspecified: Secondary | ICD-10-CM | POA: Diagnosis not present

## 2022-11-20 MED ORDER — ACETAMINOPHEN 500 MG PO TABS
1000.0000 mg | ORAL_TABLET | ORAL | Status: AC | PRN
  Administered 2022-11-21: 1000 mg via ORAL
  Filled 2022-11-20: qty 2

## 2022-11-20 NOTE — Group Note (Unsigned)
Date:  11/20/2022 Time:  8:55 PM  Group Topic/Focus:  Wrap-Up Group:   The focus of this group is to help patients review their daily goal of treatment and discuss progress on daily workbooks.     Participation Level:  {BHH PARTICIPATION WO:[id number]  Participation Quality:  {BHH PARTICIPATION QUALITY:22265}  Affect:  {BHH AFFECT:22266}  Cognitive:  {BHH COGNITIVE:22267}  Insight: {BHH Insight2:20797}  Engagement in Group:  {BHH ENGAGEMENT IN BP:8198245  Modes of Intervention:  {BHH MODES OF INTERVENTION:22269}  Additional Comments:  ***  Brad [name] 11/20/2022, 8:55 PM

## 2022-11-20 NOTE — Progress Notes (Signed)
AC attempted telephone call to [facility] office for transport.  Left message.  Pt.to be transported in AM upon contact from [facility] office.

## 2022-11-20 NOTE — Progress Notes (Signed)
Report called to [facility] Gero-Psychiatry Unit. Amy Boisvert RN Accepting report for the facility

## 2022-11-20 NOTE — Progress Notes (Deleted)
PROGRESS NOTE    Brad [name]  [id number] DOB: 25-Aug-1956 DOA: 11/17/2022 PCP: Patient, No Pcp Per   Brief Narrative:  Brad [name] is a 67 y.o. 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 Tells me he called EMS being his knees and spine were twisted and wants "body shaping" therapy. He just noticed it today. States he has not slept in 4 days or eaten. No nausea, vomiting. Feels constipated. States he has missed his medications for a few days. Upon admission he complains that he is here because his house is "full of microplastics" and that he cannot breathe.   Of note pt was last admitted from 10/19/22-10/21/22 with numerous somatic complaints and was fixated on symptoms being related to his HVAC system and microfiberglass being inhaled. He was evaluated by cardiology, pulmonology, psychiatry. Had serial troponin, EKGs and echocardiogram that ruled out ACS cause of reported chest pain.  Pulmonology evaluated and recommended outpatient follow-up. Psychiatry made adjustments to his medications and offered social work resources but he refused. Pt noted by discharging physician to likely be malingering and would come up with new somatic complaints to avoid discharge.    Since his last admission he has been evaluated nearly daily in ED for various complaints without significant findings. Today he was found by EMS in the field laying in his yard stating things in his house were causing him shortness of breath. He then left AMA in ED prior to full evaluation but then checked back in later this evening. Upon returning he was notable more agitated and tachycardic with HR 170. Had to be sedated with Haldol and Ativan. EKG showed questionable atrial fibrillation that was reviewed by cardiology fellow overnight who was also unsure of EKG findings. He was also noted to  have AKI with increase anion gap. He has been placed under IVC by ED physician to complete therapy and evaluation. Hospitalist called for admission, psychiatry called for consult.  Patient remians medically stable for discharge at this time, tentative plan to transition to inpatient psychiatry per discussion with psych once a bed has been made available.  Assessment & Plan:   Principal Problem:   AKI (acute kidney injury) ([place]) Active Problems:   Anxiety and depression   Elevated troponin   Metabolic acidosis   Altered mental status   Protein-calorie malnutrition, severe   AKI (acute kidney injury) ([place]), resolved -Creatinine is elevated to 1.40 from 0.81 with anion gap of 20 -Resolved with IV fluids, likely secondary to poor p.o. intake, prerenal in origin   Anxiety and depression, chronic -continue home buspirone, PRN hydroxyzine, mirtazapine, trazodone PRN -Patient indicates noncompliance with medications, concerned that he has been missing doses of his anxiety and depression medications likely exacerbating his mental status as below   Altered mental status -Pt has had numerous ED visits for various somatic complaints without significant finding.  -Patient continues to complain "knees and spine feels twisted and wants body-shaping therapy" he is without deformity pain or decreased range of motion, bilateral knee x-ray negative for any acute findings. -Patient was placed under IVC in the ED by ED physicians until patient can be evaluated by psychiatry.  We appreciate their insight and recommendations -Defer to psychiatry.  Unclear if patient's medication regimen needs to be adjusted or patient simply needs to be more compliant with previous regimen.  Regardless patient  is ANO x 4 but continues to confabulate and fixate on his "twisted knees" or his house being "full of micro plastics, so much so that I cannot see" -Discussed with patient's son who indicates patient was of sound mind  and body last fall and has acutely changed over the past 3 to 4 months without clear provocation.   Anion gap metabolic acidosis -Due to AKI and uremia, resolved with IV fluids.    Elevated troponin, likely supply/demand mismatch in setting of tachycardia and dehydration -EKG with sinus tacycardia difficult to discern if true atrial fibrillation.  -Initial troponin elevated, appears to be stable 60, 94, 90 -patient remains asymptomatic -UDS positive for benzos -of note patient is not on benzodiazepines in the outpatient setting but did receive lorazepam in the ED, unclear if UDS was collected before Ativan was administered -Last echocardiogram on 09/2022 with a EF of 60-65 with no regional wall motion abnormalities.  Normal diastolic parameters.  No significant valvular abnormality.  No indication to repeat this test  DVT prophylaxis: Lovenox Code Status: Full Family Communication: Discussed with son although somewhat estranged, he does want to be kept up-to-date on the patient's care but at this time is not willing to move forward with healthcare power of attorney or guardianship if that was something that was necessary.  Status is: Inpatient  Dispo: The patient is from: Home              Anticipated d/c is to: Inpt psych              Anticipated d/c date is: 24 to 48 hours              Patient currently IS medically stable for discharge  Consultants:  Psychiatry  Procedures:  None  Antimicrobials:  None indicated  Subjective: No acute issues or events overnight, patient continues to comment on his "bones moving to the wrong place" - patient is fixated on repeat imaging which we discussed was inappropriate.   Objective: Vitals:   11/19/22 0541 11/19/22 1227 11/19/22 2020 11/20/22 0500  BP: 120/72 114/72 118/78 122/81  Pulse: 64 74 71 72  Resp: 19 17    Temp: 98.4 F (36.9 C) 98.1 F (36.7 C)    TempSrc: Oral Oral    SpO2: 98% 100% 100% 98%  Weight:      Height:         Intake/Output Summary (Last 24 hours) at 11/20/2022 0716 Last data filed at 11/19/2022 2045 Gross per 24 hour  Intake 1078 ml  Output --  Net 1078 ml    Filed Weights   11/18/22 2300  Weight: 58.9 kg    Examination:  General:  Pleasantly resting in bed, No acute distress.  Somewhat cachectic in appearance. HEENT:  Normocephalic atraumatic.  Sclerae nonicteric, noninjected.  Extraocular movements intact bilaterally. Neck:  Without mass or deformity.  Trachea is midline. Lungs:  Clear to auscultate bilaterally without rhonchi, wheeze, or rales. Heart:  Regular rate and rhythm.  Without murmurs, rubs, or gallops. Abdomen:  Soft, nontender, nondistended.  Without guarding or rebound. Extremities: Without cyanosis, clubbing, edema, or obvious deformity. Vascular:  Dorsalis pedis and posterior tibial pulses palpable bilaterally. Skin:  Warm and dry, no erythema  Skeletal exam: Full skeletal exam remarkable only for mild scoliosis of the lumbar region, otherwise unremarkable.  Data Reviewed: I have personally reviewed following labs and imaging studies  CBC: Recent Labs  Lab 11/17/22 2127 11/18/22 0300 11/19/22 0516  WBC 6.9 6.5  6.6  NEUTROABS 5.1  --   --   HGB 15.0 12.8* 11.9*  HCT 43.2 37.3* 34.6*  MCV 91.1 92.8 92.8  PLT 197 184 [id number]    Basic Metabolic Panel: Recent Labs  Lab 11/17/22 2127 11/18/22 0300 11/19/22 0516  NA 136 137 135  K 3.2* 3.1* 4.2  CL 98 104 105  CO2 18* 24 25  GLUCOSE 142* 121* 105*  BUN 41* 40* 25*  CREATININE 1.40* 0.86 0.83  CALCIUM 9.4 9.1 8.4*  MG  --  2.3  --     GFR: Estimated Creatinine Clearance: 71.9 mL/min (by C-G formula based on SCr of 0.83 mg/dL).  Recent Results (from the past 240 hour(s))  Resp panel by RT-PCR (RSV, Flu A&B, Covid) Anterior Nasal Swab     Status: None   Collection Time: 11/19/22  8:09 AM   Specimen: Anterior Nasal Swab  Result Value Ref Range Status   SARS Coronavirus 2 by RT PCR NEGATIVE NEGATIVE  Final    Comment: (NOTE) SARS-CoV-2 target nucleic acids are NOT DETECTED.  The SARS-CoV-2 RNA is generally detectable in upper respiratory specimens during the acute phase of infection. The lowest concentration of SARS-CoV-2 viral copies this assay can detect is 138 copies/mL. A negative result does not preclude SARS-Cov-2 infection and should not be used as the sole basis for treatment or other patient management decisions. A negative result may occur with  improper specimen collection/handling, submission of specimen other than nasopharyngeal swab, presence of viral mutation(s) within the areas targeted by this assay, and inadequate number of viral copies(<138 copies/mL). A negative result must be combined with clinical observations, patient history, and epidemiological information. The expected result is Negative.  Fact Sheet for Patients:  EntrepreneurPulse.com.au  Fact Sheet for Healthcare Providers:  IncredibleEmployment.be  This test is no t yet approved or cleared by the [place] FDA and  has been authorized for detection and/or diagnosis of SARS-CoV-2 by FDA under an Emergency Use Authorization (EUA). This EUA will remain  in effect (meaning this test can be used) for the duration of the COVID-19 declaration under Section 564(b)(1) of the Act, 21 U.S.C.section 360bbb-3(b)(1), unless the authorization is terminated  or revoked sooner.       Influenza A by PCR NEGATIVE NEGATIVE Final   Influenza B by PCR NEGATIVE NEGATIVE Final    Comment: (NOTE) The Xpert Xpress SARS-CoV-2/FLU/RSV plus assay is intended as an aid in the diagnosis of influenza from Nasopharyngeal swab specimens and should not be used as a sole basis for treatment. Nasal washings and aspirates are unacceptable for Xpert Xpress SARS-CoV-2/FLU/RSV testing.  Fact Sheet for Patients: EntrepreneurPulse.com.au  Fact Sheet for Healthcare  Providers: IncredibleEmployment.be  This test is not yet approved or cleared by the [place] FDA and has been authorized for detection and/or diagnosis of SARS-CoV-2 by FDA under an Emergency Use Authorization (EUA). This EUA will remain in effect (meaning this test can be used) for the duration of the COVID-19 declaration under Section 564(b)(1) of the Act, 21 U.S.C. section 360bbb-3(b)(1), unless the authorization is terminated or revoked.     Resp Syncytial Virus by PCR NEGATIVE NEGATIVE Final    Comment: (NOTE) Fact Sheet for Patients: EntrepreneurPulse.com.au  Fact Sheet for Healthcare Providers: IncredibleEmployment.be  This test is not yet approved or cleared by the [place] FDA and has been authorized for detection and/or diagnosis of SARS-CoV-2 by FDA under an Emergency Use Authorization (EUA). This EUA will remain in effect (meaning this test  can be used) for the duration of the COVID-19 declaration under Section 564(b)(1) of the Act, 21 U.S.C. section 360bbb-3(b)(1), unless the authorization is terminated or revoked.  Performed at [facility], [place] [address] [place], [place] [address]      Radiology Studies: No results found.  Scheduled Meds:  amLODipine  10 mg Oral Daily   atorvastatin  10 mg Oral Once per day on Mon Wed Fri   busPIRone  20 mg Oral TID   cholecalciferol  5,000 Units Oral Daily   docusate sodium  100 mg Oral Q12H   enoxaparin (LOVENOX) injection  40 mg Subcutaneous Q24H   feeding supplement  237 mL Oral BID BM   LORazepam  1 mg Oral BID   mirtazapine  30 mg Oral QHS   multivitamin with minerals  1 tablet Oral Daily   potassium chloride  40 mEq Oral BID   pregabalin  75 mg Oral BID   zinc sulfate  220 mg Oral Daily   Continuous Infusions:     LOS: 2 days   Time spent: 77min  Shaleena Crusoe C Esai Stecklein, DO [facility]  If 7PM-7AM, please contact  night-coverage [email or web address]  11/20/2022, 7:16 AM

## 2022-11-20 NOTE — Progress Notes (Signed)
Pt was accepted to [facility] 11/20/22; BED Assignment L29  DX: Brief psychotic D/o  Pt meets inpatient criteria per Sheran Fava, FNP,  Attending Physician will be Dr. Caren Griffins, DO  Report can be called to: [phone].  Pt can arrive after 3:00PM  Per [facility] Provider, please include admission orders and agitation protocol.   Care Team notified: Haliimaile, RN, Hartley Barefoot, RN, Ardeen Garland, RN, Sheran Spine, RN, Caren Griffins, DO, Amy Boisvert, RN, Jefferey Pica, RN, Sheran Fava, FNP, [name], DO  [place], LCSWA 11/20/2022 @ 2:06 PM

## 2022-11-20 NOTE — TOC Transition Note (Addendum)
Transition of Care [facility]) - CM/SW Discharge Note   Patient Details  Name: Brad [name] MRN: [id number] Date of Birth: 03/22/1956  [facility] CM/SW Contact:  Henrietta Dine, RN Phone Number: 11/20/2022, 2:13 PM   Clinical Narrative:    Notified pt accepted to [facility] gero-psychiatry unit; Attending physician Dr. Louis Meckel; L29; room will be ready at [facility]; call report at that time to [phone]; pt's son [name]  [phone]) notified and agrees to transfer.   -[place] called for pt transport; spoke w/ operator # [phone]; IVC paperwork and d/c summary to be sent w/ pt; no TOC needs.  - 1450 - [employer] Officer Shuler arrived for pt transport; pt to [facility] and [employer] can not transport out of county.  -[id number] - called [place] and transferred to VM of [place], Transport for [employer]; call [employer] again and was given # for [employer] [phone]); spoke w/ Officer Hogan  @ [phone], and was again transferred to Sgt Cole's VM; LVM and awaiting return call; called Officer Norman Herrlich back for alternate # and he says this VM is checked every hour; awaiting return call.  -[id number]- called Jeralene Peters, TOC Supv and notified her of events; this CM was advised to contact [facility]; spoke w/ Ander Purpura, [facility] and she confirms contact # for transport; still awaiting return call from [facility] for transport.  - [id number] - called Elton Sin and she says the [place] # is [phone].  -[id number] - LVM for [facility] regarding transport; awaiting return call.  -[id number]- LVM for [facility]; awaiting return call; LVM for TOC Supv Jeralene Peters to notify her of this CM anability to reach [place] to arrange transport of pt.  -1701- updated Gae Bon, [facility] on pt transportation status; she will attempt to contact [facility] for transport; this CM informed her pt's packet is on unit, and Abby CN notified.   Final next level of care: Psychiatric Hospital Barriers to Discharge: No Barriers Identified   Patient  Goals and CMS Choice      Discharge Placement                         Discharge Plan and Services Additional resources added to the After Visit Summary for                                       Social Determinants of Health (SDOH) Interventions SDOH Screenings   Food Insecurity: Patient Declined (11/20/2022)  Housing: Low Risk  (10/20/2022)  Transportation Needs: Patient Declined (11/20/2022)  Utilities: Not At Risk (10/20/2022)  Alcohol Screen: Low Risk  (02/27/2019)  Tobacco Use: Low Risk  (11/17/2022)     Readmission Risk Interventions    08/23/2022    9:38 AM  Readmission Risk Prevention Plan  Post Dischage Appt Complete  Medication Screening Complete  Transportation Screening Complete

## 2022-11-21 ENCOUNTER — Other Ambulatory Visit: Payer: Self-pay

## 2022-11-21 ENCOUNTER — Inpatient Hospital Stay
Admission: AD | Admit: 2022-11-21 | Discharge: 2022-11-29 | DRG: 885 | Disposition: A | Discharge status: Home / Self Care | Payer: Medicare Other | Source: Intra-hospital | Attending: Addiction Medicine | Admitting: Addiction Medicine

## 2022-11-21 ENCOUNTER — Encounter: Payer: Self-pay | Admitting: Addiction Medicine

## 2022-11-21 DIAGNOSIS — Z883 Allergy status to other anti-infective agents status: Secondary | ICD-10-CM | POA: Diagnosis not present

## 2022-11-21 DIAGNOSIS — F333 Major depressive disorder, recurrent, severe with psychotic symptoms: Principal | ICD-10-CM | POA: Diagnosis present

## 2022-11-21 DIAGNOSIS — F450 Somatization disorder: Secondary | ICD-10-CM | POA: Diagnosis present

## 2022-11-21 DIAGNOSIS — K5900 Constipation, unspecified: Secondary | ICD-10-CM | POA: Diagnosis present

## 2022-11-21 DIAGNOSIS — I10 Essential (primary) hypertension: Secondary | ICD-10-CM | POA: Diagnosis present

## 2022-11-21 DIAGNOSIS — Z79899 Other long term (current) drug therapy: Secondary | ICD-10-CM | POA: Diagnosis not present

## 2022-11-21 DIAGNOSIS — E785 Hyperlipidemia, unspecified: Secondary | ICD-10-CM | POA: Diagnosis present

## 2022-11-21 DIAGNOSIS — K219 Gastro-esophageal reflux disease without esophagitis: Secondary | ICD-10-CM | POA: Diagnosis present

## 2022-11-21 DIAGNOSIS — Z888 Allergy status to other drugs, medicaments and biological substances status: Secondary | ICD-10-CM

## 2022-11-21 DIAGNOSIS — F23 Brief psychotic disorder: Principal | ICD-10-CM | POA: Diagnosis present

## 2022-11-21 DIAGNOSIS — Z801 Family history of malignant neoplasm of trachea, bronchus and lung: Secondary | ICD-10-CM

## 2022-11-21 DIAGNOSIS — Z881 Allergy status to other antibiotic agents status: Secondary | ICD-10-CM | POA: Diagnosis not present

## 2022-11-21 DIAGNOSIS — Z8613 Personal history of malaria: Secondary | ICD-10-CM | POA: Diagnosis not present

## 2022-11-21 DIAGNOSIS — Z765 Malingerer [conscious simulation]: Secondary | ICD-10-CM | POA: Diagnosis not present

## 2022-11-21 DIAGNOSIS — H9193 Unspecified hearing loss, bilateral: Secondary | ICD-10-CM | POA: Diagnosis present

## 2022-11-21 DIAGNOSIS — F419 Anxiety disorder, unspecified: Secondary | ICD-10-CM | POA: Diagnosis present

## 2022-11-21 MED ORDER — HALOPERIDOL LACTATE 5 MG/ML IJ SOLN: 5.0000 mg | Freq: Three times a day (TID) | INTRAMUSCULAR | Status: DC | PRN

## 2022-11-21 MED ORDER — MAGNESIUM HYDROXIDE 400 MG/5ML PO SUSP
30.0000 mL | Freq: Every day | ORAL | Status: DC | PRN
  Administered 2022-11-22 – 2022-11-27 (×4): 30 mL via ORAL
  Filled 2022-11-21 (×4): qty 30

## 2022-11-21 MED ORDER — ALUM & MAG HYDROXIDE-SIMETH 200-200-20 MG/5ML PO SUSP: 30.0000 mL | ORAL | Status: DC | PRN

## 2022-11-21 MED ORDER — BUSPIRONE HCL 5 MG PO TABS: 10.0000 mg | ORAL_TABLET | Freq: Three times a day (TID) | ORAL | Status: DC

## 2022-11-21 MED ORDER — TRAZODONE HCL 50 MG PO TABS
50.0000 mg | ORAL_TABLET | Freq: Every day | ORAL | Status: DC
  Administered 2022-11-21 – 2022-11-28 (×8): 50 mg via ORAL
  Filled 2022-11-21 (×8): qty 1

## 2022-11-21 MED ORDER — LORAZEPAM 1 MG PO TABS
2.0000 mg | ORAL_TABLET | Freq: Three times a day (TID) | ORAL | Status: DC | PRN
  Administered 2022-11-21: 2 mg via ORAL
  Filled 2022-11-21: qty 2

## 2022-11-21 MED ORDER — ACETAMINOPHEN 325 MG PO TABS
650.0000 mg | ORAL_TABLET | Freq: Four times a day (QID) | ORAL | Status: DC | PRN
  Administered 2022-11-25: 650 mg via ORAL
  Filled 2022-11-21: qty 2

## 2022-11-21 MED ORDER — LORAZEPAM 2 MG/ML IJ SOLN: 2.0000 mg | Freq: Three times a day (TID) | INTRAMUSCULAR | Status: DC | PRN

## 2022-11-21 MED ORDER — DIPHENHYDRAMINE HCL 25 MG PO CAPS: 50.0000 mg | ORAL_CAPSULE | Freq: Three times a day (TID) | ORAL | Status: DC | PRN

## 2022-11-21 MED ORDER — HALOPERIDOL 5 MG PO TABS
5.0000 mg | ORAL_TABLET | Freq: Three times a day (TID) | ORAL | Status: DC | PRN
  Administered 2022-11-21: 5 mg via ORAL
  Filled 2022-11-21: qty 1

## 2022-11-21 MED ORDER — DIPHENHYDRAMINE HCL 50 MG/ML IJ SOLN: 50.0000 mg | Freq: Three times a day (TID) | INTRAMUSCULAR | Status: DC | PRN

## 2022-11-21 MED ORDER — MIRTAZAPINE 15 MG PO TABS
30.0000 mg | ORAL_TABLET | Freq: Every day | ORAL | Status: DC
  Administered 2022-11-21 – 2022-11-28 (×8): 30 mg via ORAL
  Filled 2022-11-21 (×8): qty 2

## 2022-11-21 MED ORDER — BUSPIRONE HCL 5 MG PO TABS
20.0000 mg | ORAL_TABLET | Freq: Three times a day (TID) | ORAL | Status: DC
  Administered 2022-11-21 – 2022-11-29 (×24): 20 mg via ORAL
  Filled 2022-11-21 (×24): qty 4

## 2022-11-21 NOTE — Plan of Care (Signed)
  Problem: Coping: Goal: Level of anxiety will decrease Outcome: Not Progressing   Problem: Elimination: Goal: Will not experience complications related to bowel motility Outcome: Not Progressing   Problem: Pain Managment: Goal: General experience of comfort will improve Outcome: Not Progressing   

## 2022-11-21 NOTE — Progress Notes (Signed)
   11/21/22 1200  Psych Admission Type (Psych Patients Only)  Admission Status Involuntary  Psychosocial Assessment  Patient Complaints Anxiety;Disorientation;Irritability;Malaise  Eye Contact Brief  Facial Expression Anxious;Worried  Affect Anxious;Fearful  Speech Logical/coherent  Interaction Demanding;Sarcastic  Motor Activity Unsteady  Appearance/Hygiene Disheveled  Behavior Characteristics Agitated;Anxious;Irritable  Mood Anxious;Irritable  Thought Process  Coherency Circumstantial  Content Hypochondria;Preoccupation  Delusions Somatic  Perception WDL  Hallucination None reported or observed  Judgment Impaired  Confusion Mild  Danger to Self  Current suicidal ideation? Denies  Danger to Others  Danger to Others None reported or observed

## 2022-11-21 NOTE — Discharge Summary (Signed)
Physician Discharge Summary  Brad [name] [id number] DOB: 1956-08-14 DOA: 11/17/2022  PCP: Patient, No Pcp Per  Admit date: 11/17/2022 Discharge date: 11/21/2022  Admitted From: Home Disposition:  Inpt psych  Discharge Condition:stable  CODE STATUS:Full  Diet recommendation:  as tolerated  Brief/Interim Summary: Brad [name] is a 67 y.o. 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 Tells me he called EMS being his knees and spine were twisted and wants "body shaping" therapy. He just noticed it today. States he has not slept in 4 days or eaten. No nausea, vomiting. Feels constipated. States he has missed his medications for a few days. Upon admission he complains that he is here because his house is "full of microplastics" and that he cannot breathe.   Of note pt was last admitted from 10/19/22-10/21/22 with numerous somatic complaints and was fixated on symptoms being related to his HVAC system and microfiberglass being inhaled. He was evaluated by cardiology, pulmonology, psychiatry. Had serial troponin, EKGs and echocardiogram that ruled out ACS cause of reported chest pain.  Pulmonology evaluated and recommended outpatient follow-up. Psychiatry made adjustments to his medications and offered social work resources but he refused. Pt noted by discharging physician to likely be malingering and would come up with new somatic complaints to avoid discharge.    Since his last admission he has been evaluated nearly daily in ED for various complaints without significant findings. Today he was found by EMS in the field laying in his yard stating things in his house were causing him shortness of breath. He then left AMA in ED prior to full evaluation but then checked back in later this evening. Upon returning he was notable more agitated and tachycardic with HR  170. Had to be sedated with Haldol and Ativan. EKG showed questionable atrial fibrillation that was reviewed by cardiology fellow overnight who was also unsure of EKG findings. He was also noted to have AKI with increase anion gap. He has been placed under IVC by ED physician to complete therapy and evaluation. Hospitalist called for admission, psychiatry called for consult.   Patient remains medically stable for discharge at this time, plan to transition to inpatient psychiatry per discussion with psych later today. Was unable to transport safely yesterday as [place] office was unavailable.  Discharge Diagnoses:  Principal Problem:   AKI (acute kidney injury) ([place]) Active Problems:   Anxiety and depression   Elevated troponin   Metabolic acidosis   Altered mental status   Protein-calorie malnutrition, severe  AKI (acute kidney injury) ([place]), resolved -Creatinine initially elevated to 1.40 from 0.81 with anion gap of 20 -Resolved with IV fluids, likely secondary to poor p.o. intake, prerenal in origin   Anxiety and depression, chronic -continue home buspirone, PRN hydroxyzine, mirtazapine, trazodone PRN -Patient indicates noncompliance with medications, concerned that he has been missing doses of his anxiety and depression medications likely exacerbating his mental status as below   Altered mental status, resolved -Pt has had numerous ED visits for various somatic complaints without significant finding.  -Patient continues to complain "knees and spine feels twisted and wants body-shaping therapy" he is without deformity pain or decreased range of motion, bilateral knee x-ray negative for any acute findings. -Patient was placed under IVC in the ED by ED physicians until patient can be evaluated by psychiatry.  We appreciate their insight  and recommendations -Defer to psychiatry.  Unclear if patient's medication regimen needs to be adjusted or patient simply needs to be more  compliant with previous regimen.  Regardless patient is ANO x 4 but continues to confabulate and fixate on his "twisted knees" or his house being "full of micro plastics, so much so that I cannot see" -Discussed with patient's son who indicates patient was of sound mind and body last fall and has acutely changed over the past 3 to 4 months without clear provocation.   Anion gap metabolic acidosis, resolved -Due to AKI and uremia, resolved with IV fluids.    Elevated troponin, likely supply/demand mismatch in setting of tachycardia and dehydration -EKG with sinus tacycardia difficult to discern if true atrial fibrillation.  -Initial troponin elevated, appears to be stable 60, 94, 90 -patient remains asymptomatic -UDS positive for benzos -of note patient is not on benzodiazepines in the outpatient setting but did receive lorazepam in the ED, unclear if UDS was collected before Ativan was administered -Last echocardiogram on 09/2022 with a EF of 60-65 with no regional wall motion abnormalities.  Normal diastolic parameters.  No significant valvular abnormality.  No indication to repeat this test  Discharge Instructions  Discharge Instructions     Discharge patient   Complete by: As directed    Discharge disposition: 65-Discharged/transferred to [facility]iatric [facility] or [facility]   Discharge patient date: 11/20/2022      Allergies as of 11/21/2022       Reactions   Ciprofloxacin Other (See Comments)   Other Reaction(s): Dizziness (intolerance)   Citalopram Other (See Comments)   Duloxetine Other (See Comments)   Manic emotions Other Reaction(s): Other (See Comments)    Manic emotions   Duloxetine Hcl Other (See Comments)   Manic emotions   Fluticasone-umeclidin-vilant Other (See Comments)   Dry cracked lips and mouth   Gabapentin Other (See Comments)   Unable to urinate, drowsiness Unable to urinate, drowsiness    Other Reaction(s): Other (See  Comments)   Oxcarbazepine Other (See Comments)   Bad taste in his mouth Other Reaction(s): Other (See Comments)    Bad taste in his mouth        Medication List     TAKE these medications    Alpha-Lipoic Acid 600 MG Caps Take 600 mg by mouth daily.   amLODipine 5 MG tablet Commonly known as: NORVASC Take 2 tablets (10 mg total) by mouth daily.   atorvastatin 10 MG tablet Commonly known as: LIPITOR Take 10 mg by mouth 3 (three) times a week. MWF   BuSpar 10 MG tablet Generic drug: busPIRone Take 20 mg by mouth 3 (three) times daily.   Coenzyme Q10 50 MG Caps Take 50 mg by mouth daily.   cyclobenzaprine 10 MG tablet Commonly known as: FLEXERIL Take 1 tablet (10 mg total) by mouth 2 (two) times daily as needed for muscle spasms.   docusate sodium 100 MG capsule Commonly known as: COLACE Take 1 capsule (100 mg total) by mouth every 12 (twelve) hours.   hydrOXYzine 25 MG tablet Commonly known as: ATARAX Take 1 tablet (25 mg total) by mouth every 8 (eight) hours as needed.   LORazepam 1 MG tablet Commonly known as: ATIVAN Take 1 mg by mouth 2 (two) times daily.   losartan 100 MG tablet Commonly known as: COZAAR Take 100 mg by mouth daily.   meloxicam 7.5 MG tablet Commonly known as: MOBIC Take 7.5 mg by mouth daily.  mirtazapine 30 MG tablet Commonly known as: REMERON Take 30 mg by mouth at bedtime. What changed: Another medication with the same name was removed. Continue taking this medication, and follow the directions you see here.   multivitamin tablet Take 1 tablet by mouth daily.   Omega-3 1000 MG Caps Take 1,000 mg by mouth daily.   polyethylene glycol 17 g packet Commonly known as: MiraLax Take 17 g by mouth 2 (two) times daily.   pregabalin 75 MG capsule Commonly known as: LYRICA Take 75 mg by mouth 2 (two) times daily.   senna-docusate 8.6-50 MG tablet Commonly known as: Senokot-S Take 1 tablet by mouth at bedtime as needed for mild  constipation.   traZODone 50 MG tablet Commonly known as: DESYREL Take 50-100 mg by mouth at bedtime as needed for sleep.   Vitamin D3 125 MCG (5000 UT) Tabs Take 5,000 Units by mouth daily.   zaleplon 10 MG capsule Commonly known as: SONATA Take 10-20 mg by mouth at bedtime as needed for sleep.   zinc gluconate 50 MG tablet Take 50 mg by mouth daily.        Allergies  Allergen Reactions   Ciprofloxacin Other (See Comments)    Other Reaction(s): Dizziness (intolerance)   Citalopram Other (See Comments)   Duloxetine Other (See Comments)    Manic emotions  Other Reaction(s): Other (See Comments)    Manic emotions   Duloxetine Hcl Other (See Comments)    Manic emotions   Fluticasone-Umeclidin-Vilant Other (See Comments)    Dry cracked lips and mouth   Gabapentin Other (See Comments)    Unable to urinate, drowsiness  Unable to urinate, drowsiness    Other Reaction(s): Other (See Comments)   Oxcarbazepine Other (See Comments)    Bad taste in his mouth  Other Reaction(s): Other (See Comments)    Bad taste in his mouth    Consultations: Psych  Procedures/Studies: DG Knee 1-2 Views Right  Result Date: 11/18/2022 CLINICAL DATA:  Pain EXAM: RIGHT KNEE - 1-2 VIEW COMPARISON:  None Available. FINDINGS: No evidence of fracture, dislocation, or joint effusion. No evidence of arthropathy or other focal bone abnormality. Soft tissues are unremarkable. IMPRESSION: Negative. Electronically Signed   By: Rolm Baptise M.D.   On: 11/18/2022 00:57   DG Knee 1-2 Views Left  Result Date: 11/18/2022 CLINICAL DATA:  Pain EXAM: LEFT KNEE - 1-2 VIEW COMPARISON:  None Available. FINDINGS: No evidence of fracture, dislocation, or joint effusion. No evidence of arthropathy or other focal bone abnormality. Soft tissues are unremarkable. IMPRESSION: Negative. Electronically Signed   By: Rolm Baptise M.D.   On: 11/18/2022 00:57   DG Chest 2 View  Result Date: 11/17/2022 CLINICAL DATA:   dyspnea EXAM: CHEST - 2 VIEW COMPARISON:  11/12/2022. FINDINGS: Cardiac silhouette is unremarkable. No pneumothorax or pleural effusion. The lungs are clear. Convex right lower thoracic scoliosis. IMPRESSION: No acute cardiopulmonary process. Electronically Signed   By: Sammie Bench M.D.   On: 11/17/2022 17:21   DG Chest 1 View  Result Date: 11/12/2022 CLINICAL DATA:  Anxiety EXAM: CHEST  1 VIEW COMPARISON:  11/10/2022 FINDINGS: Single frontal view of the chest demonstrates a stable enlarged cardiac silhouette. No airspace disease, effusion, or pneumothorax. No acute bony abnormalities. Colonic interposition right upper quadrant. IMPRESSION: 1. Stable chest, no acute process. Electronically Signed   By: Randa Ngo M.D.   On: 11/12/2022 15:50   DG Chest 2 View  Result Date: 11/10/2022 CLINICAL DATA:  Provided history:  Chest pain. Shortness of breath. EXAM: CHEST - 2 VIEW COMPARISON:  Prior chest radiographs 11/09/2022 and earlier. FINDINGS: Mild cardiomegaly, unchanged. Aortic atherosclerosis. No appreciable airspace consolidation or pulmonary edema. No evidence of pleural effusion or pneumothorax. Dextrocurvature and spondylosis of the thoracic spine. ACDF hardware. IMPRESSION: 1. No evidence of acute cardiopulmonary abnormality. 2. Mild cardiomegaly. 3.  Aortic Atherosclerosis (ICD10-I70.0). Electronically Signed   By: Kellie Simmering D.O.   On: 11/10/2022 10:39   DG Abd Acute W/Chest  Result Date: 11/09/2022 CLINICAL DATA:  Inguinal hernia with pain radiating to testicles, shortness of breath EXAM: DG ABDOMEN ACUTE WITH 1 VIEW CHEST COMPARISON:  10/30/2022 FINDINGS: Supine and upright frontal views of the abdomen and pelvis as well as an upright frontal view of the chest are obtained. Cardiac silhouette is stable. No acute airspace disease, effusion, or pneumothorax. No bowel obstruction or ileus. Minimal stool within the distal colon. There are no masses or abnormal calcifications. No free gas in  the greater peritoneal sac. Stable colonic interposition right upper quadrant. No acute bony abnormalities.  S shaped thoracolumbar scoliosis. IMPRESSION: 1. No acute intrathoracic process. 2. Unremarkable bowel gas pattern. Electronically Signed   By: Randa Ngo M.D.   On: 11/09/2022 18:03   DG Cervical Spine Complete  Result Date: 11/06/2022 CLINICAL DATA:  Woke up with neck pain after sleeping wrong EXAM: CERVICAL SPINE - COMPLETE 5 VIEW COMPARISON:  None Available. FINDINGS: Cervical spinal fixation hardware appears intact. There is no evidence of cervical spine fracture or prevertebral soft tissue swelling. Alignment is normal. Degenerative changes above the level of the cervical fixation spanning C3-5 characterized by anterior disc osteophytes and intervertebral disc space narrowing. Right right-greater-than-left neural foraminal narrowing at C3-4. IMPRESSION: 1. Cervical spinal fixation hardware appears intact. 2. Degenerative changes above the level of the cervical fixation spanning C3-5. 3. No fracture or malalignment. Electronically Signed   By: Darrin Nipper M.D.   On: 11/06/2022 18:10   DG Knee Complete 4 Views Right  Result Date: 10/30/2022 CLINICAL DATA:  Pain after injury EXAM: RIGHT KNEE - COMPLETE 4 VIEW COMPARISON:  None Available. FINDINGS: No acute fracture or dislocation. Preserved joint spaces and bone mineralization. No joint effusion. There is an ill-defined sclerotic area involving the lateral femoral condyle. There is a differential but this could represent an underlying chondroid lesion versus bone infarct. Please correlate with any prior study. Otherwise additional workup with MRI when clinically appropriate IMPRESSION: 1. No acute osseous injury of the right knee. 2. Ill-defined sclerotic area involving the lateral femoral condyle. This has a differential including a chondroid lesion or bone infarct. Please correlate for any known history or prior imaging. Otherwise MRI can be  performed when clinically appropriate Electronically Signed   By: Jill Side M.D.   On: 10/30/2022 19:30   DG Chest 2 View  Result Date: 10/30/2022 CLINICAL DATA:  Chest pain EXAM: CHEST - 2 VIEW COMPARISON:  Chest x-ray and CT angiogram chest 10/19/2022 FINDINGS: Hyperinflation. No consolidation, pneumothorax or effusion. No edema. Borderline cardiopericardial silhouette. Films are rotated to the left. Curvature and degenerative changes along the spine. Fixation hardware along the lower cervical spine at the edge of the imaging field. The extreme right lung apex is clipped off the edge of the film. IMPRESSION: Hyperinflation.  No acute cardiopulmonary disease. Of note the extreme right lung apex is clipped off the edge of the film. Additional evaluation as clinically appropriate Electronically Signed   By: Jill Side M.D.   On:  10/30/2022 19:28     Subjective: No acute issues/events overnight   Discharge Exam: Vitals:   11/20/22 2049 11/21/22 0649  BP: 113/64 137/82  Pulse: 68 64  Resp: 16 18  Temp:  98.4 F (36.9 C)  SpO2: 100% 99%   Vitals:   11/20/22 0500 11/20/22 1350 11/20/22 2049 11/21/22 0649  BP: 122/81 115/68 113/64 137/82  Pulse: 72 66 68 64  Resp:  16 16 18   Temp:  98.3 F (36.8 C)  98.4 F (36.9 C)  TempSrc:  Oral  Oral  SpO2: 98%  100% 99%  Weight:      Height:        General: Pt is alert, awake, not in acute distress Cardiovascular: RRR, S1/S2 +, no rubs, no gallops Respiratory: CTA bilaterally, no wheezing, no rhonchi Abdominal: Soft, NT, ND, bowel sounds + Extremities: no edema, no cyanosis    The results of significant diagnostics from this hospitalization (including imaging, microbiology, ancillary and laboratory) are listed below for reference.     Microbiology: Recent Results (from the past 240 hour(s))  Resp panel by RT-PCR (RSV, Flu A&B, Covid) Anterior Nasal Swab     Status: None   Collection Time: 11/19/22  8:09 AM   Specimen: Anterior  Nasal Swab  Result Value Ref Range Status   SARS Coronavirus 2 by RT PCR NEGATIVE NEGATIVE Final    Comment: (NOTE) SARS-CoV-2 target nucleic acids are NOT DETECTED.  The SARS-CoV-2 RNA is generally detectable in upper respiratory specimens during the acute phase of infection. The lowest concentration of SARS-CoV-2 viral copies this assay can detect is 138 copies/mL. A negative result does not preclude SARS-Cov-2 infection and should not be used as the sole basis for treatment or other patient management decisions. A negative result may occur with  improper specimen collection/handling, submission of specimen other than nasopharyngeal swab, presence of viral mutation(s) within the areas targeted by this assay, and inadequate number of viral copies(<138 copies/mL). A negative result must be combined with clinical observations, patient history, and epidemiological information. The expected result is Negative.  Fact Sheet for Patients:  EntrepreneurPulse.com.au  Fact Sheet for Healthcare Providers:  IncredibleEmployment.be  This test is no t yet approved or cleared by the [place] FDA and  has been authorized for detection and/or diagnosis of SARS-CoV-2 by FDA under an Emergency Use Authorization (EUA). This EUA will remain  in effect (meaning this test can be used) for the duration of the COVID-19 declaration under Section 564(b)(1) of the Act, 21 U.S.C.section 360bbb-3(b)(1), unless the authorization is terminated  or revoked sooner.       Influenza A by PCR NEGATIVE NEGATIVE Final   Influenza B by PCR NEGATIVE NEGATIVE Final    Comment: (NOTE) The Xpert Xpress SARS-CoV-2/FLU/RSV plus assay is intended as an aid in the diagnosis of influenza from Nasopharyngeal swab specimens and should not be used as a sole basis for treatment. Nasal washings and aspirates are unacceptable for Xpert Xpress SARS-CoV-2/FLU/RSV testing.  Fact Sheet for  Patients: EntrepreneurPulse.com.au  Fact Sheet for Healthcare Providers: IncredibleEmployment.be  This test is not yet approved or cleared by the [place] FDA and has been authorized for detection and/or diagnosis of SARS-CoV-2 by FDA under an Emergency Use Authorization (EUA). This EUA will remain in effect (meaning this test can be used) for the duration of the COVID-19 declaration under Section 564(b)(1) of the Act, 21 U.S.C. section 360bbb-3(b)(1), unless the authorization is terminated or revoked.     Resp Syncytial  Virus by PCR NEGATIVE NEGATIVE Final    Comment: (NOTE) Fact Sheet for Patients: EntrepreneurPulse.com.au  Fact Sheet for Healthcare Providers: IncredibleEmployment.be  This test is not yet approved or cleared by the [place] FDA and has been authorized for detection and/or diagnosis of SARS-CoV-2 by FDA under an Emergency Use Authorization (EUA). This EUA will remain in effect (meaning this test can be used) for the duration of the COVID-19 declaration under Section 564(b)(1) of the Act, 21 U.S.C. section 360bbb-3(b)(1), unless the authorization is terminated or revoked.  Performed at [facility], [place] [address] [place], [place] [address]      Labs: BNP (last 3 results) No results for input(s): "BNP" in the last 8760 hours. Basic Metabolic Panel: Recent Labs  Lab 11/17/22 2127 11/18/22 0300 11/19/22 0516  NA 136 137 135  K 3.2* 3.1* 4.2  CL 98 104 105  CO2 18* 24 25  GLUCOSE 142* 121* 105*  BUN 41* 40* 25*  CREATININE 1.40* 0.86 0.83  CALCIUM 9.4 9.1 8.4*  MG  --  2.3  --     CBC: Recent Labs  Lab 11/17/22 2127 11/18/22 0300 11/19/22 0516  WBC 6.9 6.5 6.6  NEUTROABS 5.1  --   --   HGB 15.0 12.8* 11.9*  HCT 43.2 37.3* 34.6*  MCV 91.1 92.8 92.8  PLT 197 184 168    Urinalysis    Component Value Date/Time   COLORURINE STRAW (A)  10/13/2022 1552   APPEARANCEUR CLEAR 10/13/2022 1552   LABSPEC 1.033 (H) 10/13/2022 1552   PHURINE 6.0 10/13/2022 1552   GLUCOSEU NEGATIVE 10/13/2022 1552   HGBUR NEGATIVE 10/13/2022 1552   BILIRUBINUR NEGATIVE 10/13/2022 1552   KETONESUR NEGATIVE 10/13/2022 1552   PROTEINUR NEGATIVE 10/13/2022 1552   UROBILINOGEN 0.2 02/01/2008 1313   NITRITE NEGATIVE 10/13/2022 1552   LEUKOCYTESUR NEGATIVE 10/13/2022 1552   Sepsis Labs Recent Labs  Lab 11/17/22 2127 11/18/22 0300 11/19/22 0516  WBC 6.9 6.5 6.6    Microbiology Recent Results (from the past 240 hour(s))  Resp panel by RT-PCR (RSV, Flu A&B, Covid) Anterior Nasal Swab     Status: None   Collection Time: 11/19/22  8:09 AM   Specimen: Anterior Nasal Swab  Result Value Ref Range Status   SARS Coronavirus 2 by RT PCR NEGATIVE NEGATIVE Final    Comment: (NOTE) SARS-CoV-2 target nucleic acids are NOT DETECTED.  The SARS-CoV-2 RNA is generally detectable in upper respiratory specimens during the acute phase of infection. The lowest concentration of SARS-CoV-2 viral copies this assay can detect is 138 copies/mL. A negative result does not preclude SARS-Cov-2 infection and should not be used as the sole basis for treatment or other patient management decisions. A negative result may occur with  improper specimen collection/handling, submission of specimen other than nasopharyngeal swab, presence of viral mutation(s) within the areas targeted by this assay, and inadequate number of viral copies(<138 copies/mL). A negative result must be combined with clinical observations, patient history, and epidemiological information. The expected result is Negative.  Fact Sheet for Patients:  EntrepreneurPulse.com.au  Fact Sheet for Healthcare Providers:  IncredibleEmployment.be  This test is no t yet approved or cleared by the [place] FDA and  has been authorized for detection and/or diagnosis of  SARS-CoV-2 by FDA under an Emergency Use Authorization (EUA). This EUA will remain  in effect (meaning this test can be used) for the duration of the COVID-19 declaration under Section 564(b)(1) of the Act, 21 U.S.C.section 360bbb-3(b)(1), unless  the authorization is terminated  or revoked sooner.       Influenza A by PCR NEGATIVE NEGATIVE Final   Influenza B by PCR NEGATIVE NEGATIVE Final    Comment: (NOTE) The Xpert Xpress SARS-CoV-2/FLU/RSV plus assay is intended as an aid in the diagnosis of influenza from Nasopharyngeal swab specimens and should not be used as a sole basis for treatment. Nasal washings and aspirates are unacceptable for Xpert Xpress SARS-CoV-2/FLU/RSV testing.  Fact Sheet for Patients: EntrepreneurPulse.com.au  Fact Sheet for Healthcare Providers: IncredibleEmployment.be  This test is not yet approved or cleared by the [place] FDA and has been authorized for detection and/or diagnosis of SARS-CoV-2 by FDA under an Emergency Use Authorization (EUA). This EUA will remain in effect (meaning this test can be used) for the duration of the COVID-19 declaration under Section 564(b)(1) of the Act, 21 U.S.C. section 360bbb-3(b)(1), unless the authorization is terminated or revoked.     Resp Syncytial Virus by PCR NEGATIVE NEGATIVE Final    Comment: (NOTE) Fact Sheet for Patients: EntrepreneurPulse.com.au  Fact Sheet for Healthcare Providers: IncredibleEmployment.be  This test is not yet approved or cleared by the [place] FDA and has been authorized for detection and/or diagnosis of SARS-CoV-2 by FDA under an Emergency Use Authorization (EUA). This EUA will remain in effect (meaning this test can be used) for the duration of the COVID-19 declaration under Section 564(b)(1) of the Act, 21 U.S.C. section 360bbb-3(b)(1), unless the authorization is terminated  or revoked.  Performed at [facility], [place] [address], [place], [place] [address]      Time coordinating discharge: Over 30 minutes  SIGNED:   Little Ishikawa, DO [facility] 11/21/2022, 7:34 AM Pager   If 7PM-7AM, please contact night-coverage [email or web address]

## 2022-11-21 NOTE — Progress Notes (Signed)
Patient complaining of pain 10/10. Patient refusing PRN pain medication.

## 2022-11-21 NOTE — Tx Team (Signed)
Initial Treatment Plan 11/21/2022 1:15 PM Demarlo Bhola [facility] [id number]    PATIENT STRESSORS: Health problems     PATIENT STRENGTHS: Capable of independent living  Communication skills  General fund of knowledge    PATIENT IDENTIFIED PROBLEMS: Pain all over                      DISCHARGE CRITERIA:  Ability to meet basic life and health needs Improved stabilization in mood, thinking, and/or behavior  PRELIMINARY DISCHARGE PLAN: Return to previous living arrangement  PATIENT/FAMILY INVOLVEMENT: This treatment plan has been presented to and reviewed with the patient, Eura Rueda Vanrossum. The patient has been given the opportunity to ask questions and make suggestions.  Nolon Bussing, RN 11/21/2022, 1:15 PM

## 2022-11-21 NOTE — Group Note (Signed)
Date:  11/21/2022 Time:  6:11 PM  Group Topic/Focus:  Making Healthy Choices:   The focus of this group is to help patients identify negative/unhealthy choices they were using prior to admission and identify positive/healthier coping strategies to replace them upon discharge. Overcoming Stress:   The focus of this group is to define stress and help patients assess their triggers. Rediscovering Joy:   The focus of this group is to explore various ways to relieve stress in a positive manner.    Participation Level:  Active  Participation Quality:  Appropriate  Affect:  Appropriate  Cognitive:  Alert  Insight: Appropriate  Engagement in Group:  Engaged  Modes of Intervention:  Activity and Support  Additional Comments:    Brad [name] l [name] 11/21/2022, 6:11 PM

## 2022-11-21 NOTE — Group Note (Signed)
LCSW Group Therapy Note  Group Date: 11/21/2022 Start Time: 1300 End Time: 1400   Type of Therapy and Topic:  Group Therapy - Healthy vs Unhealthy Coping Skills  Participation Level:  Active   Description of Group The focus of this group was to determine what unhealthy coping techniques typically are used by group members and what healthy coping techniques would be helpful in coping with various problems. Patients were guided in becoming aware of the differences between healthy and unhealthy coping techniques. Patients were asked to identify 2-3 healthy coping skills they would like to learn to use more effectively.  Therapeutic Goals Patients learned that coping is what human beings do all day long to deal with various situations in their lives Patients defined and discussed healthy vs unhealthy coping techniques Patients identified their preferred coping techniques and identified whether these were healthy or unhealthy Patients determined 2-3 healthy coping skills they would like to become more familiar with and use more often. Patients provided support and ideas to each other   Summary of Patient Progress:  Patient was present for the entirety of the group session. Patient was an active listener and participated in the topic of discussion, provided helpful advice to others, and added nuance to topic of conversation.  Patient contributed during the ice breaker by stating that he if he could be anywhere in the world he would be with his wife in [place]. He stated that he enjoys taking walks.   Therapeutic Modalities Cognitive Behavioral Therapy Motivational Interviewing  Genia Del 11/21/2022  3:06 PM

## 2022-11-21 NOTE — Progress Notes (Signed)
Admission Note:   67 yr male who presents IVC in no acute distress for the treatment. Pt was irritable, anxious, sarcastic, and demanding during admission process. Pt presents with somatic delusions. Patient states, " my knees and spine is twisted." Pt denies, SI, PI, and AVH . Pt states he has experienced pain for days that has worsened since being in the hospital. Patient states, " I need fentanyl and another scheduled II drug." Patient fixated on wife'w whereabouts and her not answering the phone. Skin was assessed and found to be clear of any abnormal marks. PT searched and no contraband found, POC and unit policies explained and understanding verbalized. Consents obtained. Food and fluids offered, and accepted. Pt had no additional questions or concerns

## 2022-11-21 NOTE — Plan of Care (Signed)
  Problem: Education: Goal: Knowledge of General Education information will improve Description: Including pain rating scale, medication(s)/side effects and non-pharmacologic comfort measures Outcome: Not Progressing   Problem: Health Behavior/Discharge Planning: Goal: Ability to manage health-related needs will improve Outcome: Not Progressing   Problem: Clinical Measurements: Goal: Ability to maintain clinical measurements within normal limits will improve Outcome: Not Progressing Goal: Will remain free from infection Outcome: Not Progressing Goal: Diagnostic test results will improve Outcome: Not Progressing Goal: Respiratory complications will improve Outcome: Not Progressing Goal: Cardiovascular complication will be avoided Outcome: Not Progressing   Problem: Nutrition: Goal: Adequate nutrition will be maintained Outcome: Not Progressing   Problem: Activity: Goal: Risk for activity intolerance will decrease Outcome: Not Progressing   Problem: Coping: Goal: Level of anxiety will decrease Outcome: Not Progressing   Problem: Elimination: Goal: Will not experience complications related to bowel motility Outcome: Not Progressing Goal: Will not experience complications related to urinary retention Outcome: Not Progressing   Problem: Pain Managment: Goal: General experience of comfort will improve Outcome: Not Progressing   Problem: Safety: Goal: Ability to remain free from injury will improve Outcome: Not Progressing   Problem: Skin Integrity: Goal: Risk for impaired skin integrity will decrease Outcome: Not Progressing   Problem: Self-Concept: Goal: Will verbalize positive feelings about self Outcome: Not Progressing   Problem: Self-Care: Goal: Ability to participate in self-care as condition permits will improve Outcome: Not Progressing   Problem: Safety: Goal: Ability to redirect hostility and anger into socially appropriate behaviors will  improve Outcome: Not Progressing Goal: Ability to remain free from injury will improve Outcome: Not Progressing

## 2022-11-22 DIAGNOSIS — F333 Major depressive disorder, recurrent, severe with psychotic symptoms: Secondary | ICD-10-CM

## 2022-11-22 MED ORDER — ZOLPIDEM TARTRATE 5 MG PO TABS
5.0000 mg | ORAL_TABLET | Freq: Every day | ORAL | Status: DC
  Administered 2022-11-22 – 2022-11-28 (×7): 5 mg via ORAL
  Filled 2022-11-22 (×7): qty 1

## 2022-11-22 MED ORDER — ENSURE ENLIVE PO LIQD
237.0000 mL | Freq: Three times a day (TID) | ORAL | Status: DC
  Administered 2022-11-22 – 2022-11-29 (×17): 237 mL via ORAL

## 2022-11-22 MED ORDER — ADULT MULTIVITAMIN W/MINERALS CH
1.0000 | ORAL_TABLET | Freq: Every day | ORAL | Status: DC
  Administered 2022-11-22 – 2022-11-29 (×8): 1 via ORAL
  Filled 2022-11-22 (×8): qty 1

## 2022-11-22 MED ORDER — ARIPIPRAZOLE 5 MG PO TABS
5.0000 mg | ORAL_TABLET | Freq: Every day | ORAL | Status: DC
  Administered 2022-11-22 – 2022-11-29 (×8): 5 mg via ORAL
  Filled 2022-11-22 (×8): qty 1

## 2022-11-22 MED ORDER — LORAZEPAM 1 MG PO TABS
2.0000 mg | ORAL_TABLET | Freq: Two times a day (BID) | ORAL | Status: DC
  Administered 2022-11-22 – 2022-11-29 (×15): 2 mg via ORAL
  Filled 2022-11-22 (×15): qty 2

## 2022-11-22 NOTE — BHH Counselor (Signed)
Adult Comprehensive Assessment  Patient ID: Brad [name], male   DOB: Aug 22, 1956, 67 y.o.   MRN: [id number]  Information Source: Information source: Patient  Current Stressors:  Patient states their primary concerns and needs for treatment are:: "microplastics were falling and affecting my breathing" Patient states their goals for this hospitilization and ongoing recovery are:: "I would like to be free ofg physical pain that O've been dealing with" Educational / Learning stressors: Pt denies. Employment / Job issues: Pt denies. Family Relationships: "My son convinced doctores that I should be here.  I hate him right now.  He's dead to me." Financial / Lack of resources (include bankruptcy): Pt denies. Housing / Lack of housing: "The microplastics are killing me." Physical health (include injuries & life threatening diseases): Pt denies. Social relationships: Pt denies. Substance abuse: Pt denies. Bereavement / Loss: "My daughter passed at age 2."  Living/Environment/Situation:  Living Arrangements: Alone Living conditions (as described by patient or guardian): "the microplastics are falling from the HVAC killing me" How long has patient lived in current situation?: "I've been renting for 3 years" What is atmosphere in current home:  ("the physical atmosphere is horrible toxic, when my wife is there it's great")  Family History:  Marital status: Married Number of Years Married: 8 What types of issues is patient dealing with in the relationship?: "very happily married, looking forward to getting to [place] with her" Does patient have children?: Yes How many children?: 1 How is patient's relationship with their children?: Precious assessment indicates that patient's daughter passed age 14.  Pt reports conflictual relationship with son.  Childhood History:  By whom was/is the patient raised?: Mother, Grandparents Description of patient's relationship with caregiver when they were a  child: "just fine and very privleged" How were you disciplined when you got in trouble as a child/adolescent?: "sometimes by not getting what I wanted" Does patient have siblings?: No Did patient suffer any verbal/emotional/physical/sexual abuse as a child?: No Did patient suffer from severe childhood neglect?: No Has patient ever been sexually abused/assaulted/raped as an adolescent or adult?: No Was the patient ever a victim of a crime or a disaster?: No Witnessed domestic violence?: No Has patient been affected by domestic violence as an adult?: No  Education:  Highest grade of school patient has completed: "PhD" Currently a student?: No Learning disability?: No  Employment/Work Situation:   Employment Situation: Employed 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 If attempted suicide, did drugs/alcohol play a role in this?: No Has alcohol/substance abuse ever caused legal problems?: No  Social Support System:   Patient's Community Support System: None Describe Community Support System: Pt denies. Type of faith/religion: Pt denies. How does patient's faith help to cope with current illness?: Pt denies.  Leisure/Recreation:   Do You Have Hobbies?: Yes Leisure and Hobbies: "play guitar, read, hike, travel"  Strengths/Needs:   What is the patient's perception of their strengths?: "I'm very intelligent,  self  aware.  Generous.  Very helpful to other people." Patient states they can use these personal strengths during their treatment to contribute to their recovery: Pt denies. Patient states these barriers may affect/interfere with their treatment: Pt denies. Patient states these barriers may affect their return to the community: "just my son"  Discharge Plan:   Currently receiving community mental health services: Yes (From Whom) ([name]) Patient states concerns and preferences for aftercare planning are: Pt reports that he plans on continuing with current provider. Patient states they will know when they are safe and ready for discharge when: "I'm sure you all will tell me." Does patient have access to transportation?: No Does patient have financial barriers related to discharge medications?: No Plan for no access to transportation at discharge: CSW to assist with transportation needs. Will patient be returning to same living situation after discharge?: Yes  Summary/Recommendations:   Summary and Recommendations (to be completed by the evaluator): Patient is a 67 year old male from [place], [place] ([place]   He presents to the hospital for concerns of "microplastics in his home".  Initial assessments indicate that the patient has been reporting that microplastics are in the air and as a result he had to sleep outside his home.  Patient reports that he will stay in a hotel at discharge due to the microplastics in his home. He reports a conflictual relationship with his son whom he states is "dead to me" and that son is pursuing guardianship over him.  Patient identified additional triggers as his poor relationship with his son, death of his daughter at age 13.  He has a current mental health provider and plans to continue.  Recommendations include: crisis stabilization, therapeutic milieu, encourage group attendance and participation, medication management for mood stabilization and  development of comprehensive mental wellness/sobriety plan.  Brad [name]. 11/22/2022

## 2022-11-22 NOTE — Progress Notes (Signed)
Pt c/o pain 10/10 in his feet, hands and throat. Pt said his throat feels full. Assessed the area. No difference in right and left. No fullness could be felt. Asked pt to swallow. Trachea midline. Pt says feet are stinging and back has stabbing pain. Pt offered pain medication in MAR. "The only thing that works for me is Percocet." Pt encouraged to speak to provider tomorrow. Pt offered heat packs. Again, pt refused. "I know what works and that is just what they don't give me."

## 2022-11-22 NOTE — Group Note (Signed)
Date:  11/22/2022 Time:  8:55 PM  Group Topic/Focus:  Building Self Esteem:   The Focus of this group is helping patients become aware of the effects of self-esteem on their lives, the things they and others do that enhance or undermine their self-esteem, seeing the relationship between their level of self-esteem and the choices they make and learning ways to enhance self-esteem.    Participation Level:  Active  Participation Quality:  Appropriate  Affect:  Appropriate  Cognitive:  Alert  Insight: Improving and Limited  Engagement in Group:  Engaged  Modes of Intervention:  Support  Additional Comments:    Bradd Canary 11/22/2022, 8:55 PM

## 2022-11-22 NOTE — BHH Suicide Risk Assessment (Signed)
[facility] Admission Suicide Risk Assessment   Nursing information obtained from:  Patient Demographic factors:  Male, Age 67 or older, Caucasian Current Mental Status:  NA Loss Factors:  NA Historical Factors:  Prior suicide attempts Risk Reduction Factors:  NA  Total Time spent with patient: 1 hour Principal Problem: Brief psychotic disorder ([place]) Diagnosis:  Principal Problem:   Brief psychotic disorder ([place])  Subjective Data:  Brad [name] is a 67 y.o. male with medical history significant of HTN, HLD, anxiety and depression who presents with complain of his "knees being twisted."   He is alert and oriented x 4. Tells me he called EMS being his knees and spine were twisted and wants "body shaping" therapy. He just noticed it today. States he has not slept in 4 days or eaten. No nausea, vomiting. Feels constipated. States he has missed his medications for a few days.   Pt last admitted from 10/19/22-10/21/22 with numerous somatic complaints and was fixated on symptoms being related to his HVAC system and microfiberglass being inhaled. He was evaluated by cardiology, pulmonology, psychiatry. Had serial troponin, EKGs and echocardiogram that ruled out ACS cause of chest pain.  Pulmonology evaluated and recommended outpatient follow-up.  Psychiatry made adjustments to his medications and offered social work resources but he refused. Pt noted by discharging physician to likely be malingering and would come up with new somatic complaints to avoid discharge.    Since his last admission he has almost been evaluated daily in ED for various complaints without significant findings.    Today he was found by EMS in the field laying in his yard stating things in his house were causing him shortness of breath. He then left AMA in ED prior to full evaluation but then checked back in later this evening. Upon returning he was notable more agitated and tachycardic with HR 170. Had to be sedated with Haldo and Ativan.  EKG showed questionable atrial fibrillation that was reviewed by cardiology fellow overnight who was also unsure of EKG findings.Troponin is elevated to 60 compare to prior of 12 last week. Recommended keeping on observation overnight.    He was also noted to have AKI with increase anion gap. He has been placed under IVC by ED physician.   Continued Clinical Symptoms:  Alcohol Use Disorder Identification Test Final Score (AUDIT): 2 The "Alcohol Use Disorders Identification Test", Guidelines for Use in Primary Care, Second Edition.  World Pharmacologist [facility]). Score between 0-7:  no or low risk or alcohol related problems. Score between 8-15:  moderate risk of alcohol related problems. Score between 16-19:  high risk of alcohol related problems. Score 20 or above:  warrants further diagnostic evaluation for alcohol dependence and treatment.   CLINICAL FACTORS:   Severe Anxiety and/or Agitation Depression:   Delusional   Musculoskeletal: Strength & Muscle Tone: within normal limits Gait & Station: normal Patient leans: N/A  Psychiatric Specialty Exam:  Presentation  General Appearance:  Appropriate for Environment; Casual  Eye Contact: Good  Speech: Clear and Coherent; Normal Rate  Speech Volume: Normal  Handedness: Right   Mood and Affect  Mood: Euthymic  Affect: Appropriate; Congruent   Thought Process  Thought Processes: Irrevelant  Descriptions of Associations:Intact  Orientation:Full (Time, Place and Person)  Thought Content:Paranoid Ideation; Perseveration; Tangential; Delusions  History of Schizophrenia/Schizoaffective disorder:Yes  Duration of Psychotic Symptoms:Greater than six months  Hallucinations:No data recorded Ideas of Reference:Delusions; Paranoia; Percusatory  Suicidal Thoughts:No data recorded Homicidal Thoughts:No data recorded  Sensorium  Memory: Immediate Fair; Recent Fair; Remote  Fair  Judgment: Fair  Insight: Fair   Community education officer  Concentration: Fair  Attention Span: Good  Recall: Good  Fund of Knowledge: Good  Language: Fair   Psychomotor Activity  Psychomotor Activity:No data recorded  Assets  Assets: Social Support; Armed forces logistics/support/administrative officer; Desire for Improvement; Housing; Transportation; Chartered certified accountant; Leisure Time; Physical Health; Financial Resources/Insurance; Resilience; Talents/Skills   Sleep  Sleep:No data recorded    Blood pressure 110/81, pulse 70, temperature 98.3 F (36.8 C), temperature source Oral, resp. rate 18, height 5\' 9"  (1.753 m), weight 58 kg, SpO2 99 %. Body mass index is 18.88 kg/m.   COGNITIVE FEATURES THAT CONTRIBUTE TO RISK:  Polarized thinking and Thought constriction (tunnel vision)    SUICIDE RISK:   Minimal: No identifiable suicidal ideation.  Patients presenting with no risk factors but with morbid ruminations; may be classified as minimal risk based on the severity of the depressive symptoms  PLAN OF CARE: See orders  I certify that inpatient services furnished can reasonably be expected to improve the patient's condition.   [name], DO 11/22/2022, 10:21 AM

## 2022-11-22 NOTE — Progress Notes (Addendum)
   11/22/22 2005  Psych Admission Type (Psych Patients Only)  Admission Status Involuntary  Psychosocial Assessment  Patient Complaints Irritability  Eye Contact Brief  Facial Expression Animated;Sullen  Affect Irritable  Speech Logical/coherent  Interaction Demanding;Sarcastic  Motor Activity Slow;Unsteady (does not want to use his walker)  Behavior Characteristics Cooperative;Irritable;Impulsive  Mood Irritable  Thought Process  Coherency Circumstantial  Content Hypochondria;Preoccupation  Delusions Somatic  Perception WDL  Hallucination None reported or observed  Judgment Impaired  Confusion WDL  Danger to Self  Current suicidal ideation? Denies  Danger to Others  Danger to Others None reported or observed   Progress note   D: Pt seen at nurse's station. Pt denies SI, HI, AVH. Pt gives sarcastic responses to assessment questions. Pt rates pain  10/10 in his back and his feet. Reports stabbing pain in his back and stinging in his feet. Says it has been going on for a couple of weeks. Refused pain medication ordered for him. Says he has fullness in throat and can't breathe although pt is talking to this writer and recent VS WNL. States that there are "microplastics" in his air system in his home. Pt rates anxiety  0/10 and depression  0/10. Pt reminded to use his walker. Frequently leaves it and then walks without it. Educated on need for its use on the unit. No other concerns noted at this time.  A: Pt provided support and encouragement. Pt given scheduled medication as prescribed. PRNs as appropriate. Q15 min checks for safety.   R: Pt safe on the unit. Will continue to monitor.

## 2022-11-22 NOTE — Progress Notes (Signed)
Pt ambulating to his room without his walker. NT asked to take walker to pt. Pt educated by this Probation officer on his high fall risk status and the need to use his walker for safety. Refused teaching. Will continue to monitor.

## 2022-11-22 NOTE — BH IP Treatment Plan (Signed)
Interdisciplinary Treatment and Diagnostic Plan Update  11/22/2022 Time of Session: 10:15AM [facility] MRN: [id number]  Principal Diagnosis: Severe recurrent major depressive disorder with psychotic features [facility])  Secondary Diagnoses: Principal Problem:   Severe recurrent major depressive disorder with psychotic features ([place])   Current Medications:  Current Facility-Administered Medications  Medication Dose Route Frequency Provider Last Rate Last Admin   acetaminophen (TYLENOL) tablet 650 mg  650 mg Oral Q6H PRN [name], [name], DO       alum & mag hydroxide-simeth (MAALOX/MYLANTA) 200-200-20 MG/5ML suspension 30 mL  30 mL Oral Q4H PRN [name], [name], DO       ARIPiprazole (ABILIFY) tablet 5 mg  5 mg Oral Daily [name], DO       busPIRone (BUSPAR) tablet 20 mg  20 mg Oral TID [name], NP   20 mg at 11/21/22 2221   diphenhydrAMINE (BENADRYL) capsule 50 mg  50 mg Oral TID PRN [name] L, DO       Or   diphenhydrAMINE (BENADRYL) injection 50 mg  50 mg Intramuscular TID PRN [name] L, DO       feeding supplement (ENSURE ENLIVE / ENSURE PLUS) liquid 237 mL  237 mL Oral TID BM [name], [name], DO   237 mL at 11/22/22 1010   haloperidol (HALDOL) tablet 5 mg  5 mg Oral TID PRN [name] L, DO   5 mg at 11/21/22 2142   Or   haloperidol lactate (HALDOL) injection 5 mg  5 mg Intramuscular TID PRN [name] L, DO       LORazepam (ATIVAN) tablet 2 mg  2 mg Oral BID [name], DO       magnesium hydroxide (MILK OF MAGNESIA) suspension 30 mL  30 mL Oral Daily PRN [name] L, DO       mirtazapine (REMERON) tablet 30 mg  30 mg Oral QHS Dixon, Rashaun M, NP   30 mg at 11/21/22 2221   multivitamin with minerals tablet 1 tablet  1 tablet Oral Daily [name] L, DO       traZODone (DESYREL) tablet 50 mg  50 mg Oral QHS Dixon, Rashaun M, NP   50 mg at 11/21/22 2221   zolpidem (AMBIEN) tablet 5 mg  5 mg  Oral QHS [name], DO       PTA Medications: Medications Prior to Admission  Medication Sig Dispense Refill Last Dose   Alpha-Lipoic Acid 600 MG CAPS Take 600 mg by mouth daily.      amLODipine (NORVASC) 5 MG tablet Take 2 tablets (10 mg total) by mouth daily. 90 tablet 1    atorvastatin (LIPITOR) 10 MG tablet Take 10 mg by mouth 3 (three) times a week. MWF      busPIRone (BUSPAR) 10 MG tablet Take 20 mg by mouth 3 (three) times daily.       Cholecalciferol (VITAMIN D3) 125 MCG (5000 UT) TABS Take 5,000 Units by mouth daily.      Coenzyme Q10 50 MG CAPS Take 50 mg by mouth daily.      cyclobenzaprine (FLEXERIL) 10 MG tablet Take 1 tablet (10 mg total) by mouth 2 (two) times daily as needed for muscle spasms. 20 tablet 0    docusate sodium (COLACE) 100 MG capsule Take 1 capsule (100 mg total) by mouth every 12 (twelve) hours. 60 capsule 0    hydrOXYzine (ATARAX) 25 MG tablet Take 1 tablet (25 mg total)  by mouth every 8 (eight) hours as needed. 12 tablet 0    LORazepam (ATIVAN) 1 MG tablet Take 1 mg by mouth 2 (two) times daily.      losartan (COZAAR) 100 MG tablet Take 100 mg by mouth daily.      meloxicam (MOBIC) 7.5 MG tablet Take 7.5 mg by mouth daily.      mirtazapine (REMERON) 30 MG tablet Take 30 mg by mouth at bedtime.      Multiple Vitamin (MULTIVITAMIN) tablet Take 1 tablet by mouth daily.      Omega-3 1000 MG CAPS Take 1,000 mg by mouth daily.      polyethylene glycol (MIRALAX) 17 g packet Take 17 g by mouth 2 (two) times daily. 14 each 0    pregabalin (LYRICA) 75 MG capsule Take 75 mg by mouth 2 (two) times daily.      senna-docusate (SENOKOT-S) 8.6-50 MG tablet Take 1 tablet by mouth at bedtime as needed for mild constipation. 30 tablet 0    traZODone (DESYREL) 50 MG tablet Take 50-100 mg by mouth at bedtime as needed for sleep.      zaleplon (SONATA) 10 MG capsule Take 10-20 mg by mouth at bedtime as needed for sleep.      zinc gluconate 50 MG tablet Take 50 mg by  mouth daily.       Patient Stressors: Health problems    Patient Strengths: Capable of independent living  [employer]eting executive fund of knowledge   Treatment Modalities: Medication Management, Group therapy, Case management,  1 to 1 session with clinician, Psychoeducation, Recreational therapy.   Physician Treatment Plan for Primary Diagnosis: Severe recurrent major depressive disorder with psychotic features ([place]) Long Term Goal(s): Improvement in symptoms so as ready for discharge   Short Term Goals: Ability to identify changes in lifestyle to reduce recurrence of condition will improve Ability to verbalize feelings will improve Ability to disclose and discuss suicidal ideas Ability to demonstrate self-control will improve Ability to identify and develop effective coping behaviors will improve Ability to maintain clinical measurements within normal limits will improve Compliance with prescribed medications will improve  Medication Management: Evaluate patient's response, side effects, and tolerance of medication regimen.  Therapeutic Interventions: 1 to 1 sessions, Unit Group sessions and Medication administration.  Evaluation of Outcomes: Not Met  Physician Treatment Plan for Secondary Diagnosis: Principal Problem:   Severe recurrent major depressive disorder with psychotic features ([place])  Long Term Goal(s): Improvement in symptoms so as ready for discharge   Short Term Goals: Ability to identify changes in lifestyle to reduce recurrence of condition will improve Ability to verbalize feelings will improve Ability to disclose and discuss suicidal ideas Ability to demonstrate self-control will improve Ability to identify and develop effective coping behaviors will improve Ability to maintain clinical measurements within normal limits will improve Compliance with prescribed medications will improve     Medication Management: Evaluate patient's response, side  effects, and tolerance of medication regimen.  Therapeutic Interventions: 1 to 1 sessions, Unit Group sessions and Medication administration.  Evaluation of Outcomes: Not Met   RN Treatment Plan for Primary Diagnosis: Severe recurrent major depressive disorder with psychotic features ([place]) Long Term Goal(s): Knowledge of disease and therapeutic regimen to maintain health will improve  Short Term Goals: Ability to verbalize frustration and anger appropriately will improve, Ability to demonstrate self-control, Ability to participate in decision making will improve, Ability to verbalize feelings will improve, Ability to disclose and discuss suicidal ideas, Ability  to identify and develop effective coping behaviors will improve, and Compliance with prescribed medications will improve  Medication Management: RN will administer medications as ordered by provider, will assess and evaluate patient's response and provide education to patient for prescribed medication. RN will report any adverse and/or side effects to prescribing provider.  Therapeutic Interventions: 1 on 1 counseling sessions, Psychoeducation, Medication administration, Evaluate responses to treatment, Monitor vital signs and CBGs as ordered, Perform/monitor CIWA, COWS, AIMS and Fall Risk screenings as ordered, Perform wound care treatments as ordered.  Evaluation of Outcomes: Not Met   LCSW Treatment Plan for Primary Diagnosis: Severe recurrent major depressive disorder with psychotic features ([place]) Long Term Goal(s): Safe transition to appropriate next level of care at discharge, Engage patient in therapeutic group addressing interpersonal concerns.  Short Term Goals: Engage patient in aftercare planning with referrals and resources, Increase social support, Increase ability to appropriately verbalize feelings, Increase emotional regulation, Facilitate acceptance of mental health diagnosis and concerns, and Increase skills for  wellness and recovery  Therapeutic Interventions: Assess for all discharge needs, 1 to 1 time with Social worker, Explore available resources and support systems, Assess for adequacy in community support network, Educate family and significant other(s) on suicide prevention, Complete Psychosocial Assessment, Interpersonal group therapy.  Evaluation of Outcomes: Not Met   Progress in Treatment: Attending groups: No. Participating in groups: No. Taking medication as prescribed: Yes. Toleration medication: Yes. Family/Significant other contact made: No, will contact:  once permission is given. Patient understands diagnosis: No. Discussing patient identified problems/goals with staff: Yes. Medical problems stabilized or resolved: Yes. Denies suicidal/homicidal ideation: Yes. Issues/concerns per patient self-inventory: No. Other: none  New problem(s) identified: No, Describe:  none  New Short Term/Long Term Goal(s): elimination of symptoms of psychosis, medication management for mood stabilization; elimination of SI thoughts; development of comprehensive mental wellness plan.   Patient Goals:  "I would like to be free of physical pain that I've been dealing with"  Discharge Plan or Barriers: CSW to assist patient in development of appropriate discharge plans.   Reason for Continuation of Hospitalization: Aggression Anxiety Delusions  Medical Issues Medication stabilization  Estimated Length of Stay:  1-7 days  Last [place] Suicide Severity Risk Score: Flowsheet Row Admission (Current) from 11/21/2022 in [place] Most recent reading at 11/21/2022 11:00 AM ED to Hosp-Admission (Discharged) from 11/17/2022 in [place] Most recent reading at 11/17/2022  7:46 PM ED from 11/17/2022 in [facility] Emergency Department at [facility] Most recent reading at 11/17/2022  2:07 PM  C-SSRS RISK CATEGORY No Risk No Risk No  Risk       Last PHQ 2/9 Scores:    03/17/2015    1:56 PM  Depression screen PHQ 2/9  Decreased Interest 1  Down, Depressed, Hopeless 1  PHQ - 2 Score 2  Altered sleeping 3  Tired, decreased energy 0  Change in appetite 0  Feeling bad or failure about yourself  0  Trouble concentrating 1  Moving slowly or fidgety/restless 0  Suicidal thoughts 0  PHQ-9 Score 6    Scribe for Treatment Team: Rozann Lesches, LCSW 11/22/2022 11:15 AM

## 2022-11-22 NOTE — H&P (Signed)
Psychiatric Admission Assessment Adult  Patient Identification: Brad [name] MRN:  [id number] Date of Evaluation:  11/22/2022 Chief Complaint:  Brief psychotic disorder Brad [name]) [F23] Principal Diagnosis: Severe recurrent major depressive disorder with psychotic features ([place]) Diagnosis:  Principal Problem:   Severe recurrent major depressive disorder with psychotic features ([place])  History of Present Illness: Brad [name] is a 67 year old white male who is involuntarily admitted to inpatient psychiatry for depression with psychotic features.  He has been to the emergency room approximately 30 times over the last couple months.  He has been diagnosed with anxiety, somatization disorder, malingering, and depression.  He sees [name] on an outpatient basis but has not seen him in a couple months.  He is on BuSpar, Ativan, Remeron, trazodone and Ambien.  He denies any suicidal ideation.  He denies any auditory or visual hallucinations.  He believes that his heating and air conditioning ducts are spewing out micro plastics and that are clogging up his lungs and making it difficult for him to breathe.  He has been medically worked up and discharged.  He was first psychiatrically hospitalized in his 16s for depression and most recently in 2020 at [facility] behavioral health  and at [facility] in the same year.  He is married and rents a place in [place].  He has a son in his 53s named Brad [name] who he states is part of the commitment to [facility].  PER RECENT MEDICAL EVALUATION: Brad [name] is a 67 y.o. male with medical history significant of HTN, HLD, anxiety and depression who presents with complain of his "knees being twisted."   He is alert and oriented x 4. Tells me he called EMS being his knees and spine were twisted and wants "body shaping" therapy. He just noticed it today. States he has not slept in 4 days or eaten. No nausea, vomiting. Feels constipated. States he has missed his medications for a few days.   Pt  last admitted from 10/19/22-10/21/22 with numerous somatic complaints and was fixated on symptoms being related to his HVAC system and microfiberglass being inhaled. He was evaluated by cardiology, pulmonology, psychiatry. Had serial troponin, EKGs and echocardiogram that ruled out ACS cause of chest pain.  Pulmonology evaluated and recommended outpatient follow-up.  Psychiatry made adjustments to his medications and offered social work resources but he refused. Pt noted by discharging physician to likely be malingering and would come up with new somatic complaints to avoid discharge.    Since his last admission he has almost been evaluated daily in ED for various complaints without significant findings.    Today he was found by EMS in the field laying in his yard stating things in his house were causing him shortness of breath. He then left AMA in ED prior to full evaluation but then checked back in later this evening. Upon returning he was notable more agitated and tachycardic with HR 170. Had to be sedated with Haldo and Ativan. EKG showed questionable atrial fibrillation that was reviewed by cardiology fellow overnight who was also unsure of EKG findings.Troponin is elevated to 60 compare to prior of 12 last week. Recommended keeping on observation overnight.    He was also noted to have AKI with increase anion gap. He has been placed under IVC by ED physician.  Associated Signs/Symptoms: Depression Symptoms:  depressed mood, anxiety, (Hypo) Manic Symptoms:  Delusions, Anxiety Symptoms:  Excessive Worry, Specific Phobias, Psychotic Symptoms:  Delusions, PTSD Symptoms: NA Total Time spent with patient: 1 hour  Past Psychiatric History: As above  Is the patient at risk to self? No.  Has the patient been a risk to self in the past 6 months? No.  Has the patient been a risk to self within the distant past? No.  Is the patient a risk to others? No.  Has the patient been a risk to others in the  past 6 months? No.  Has the patient been a risk to others within the distant past? No.   [place] Scale:  [place] [facility] Admission (Current) from 11/21/2022 in [place] Most recent reading at 11/21/2022 11:00 AM ED to Hosp-Admission (Discharged) from 11/17/2022 in [place] Most recent reading at 11/17/2022  7:46 PM ED from 11/17/2022 in [facility] Emergency Department at [facility] Most recent reading at 11/17/2022  2:07 PM  C-SSRS RISK CATEGORY No Risk No Risk No Risk        Prior Inpatient Therapy:Yes If yes, describe as above Prior Outpatient Therapy: Yes.   If yes, describe [name]  Alcohol Screening: 1. How often do you have a drink containing alcohol?: 2 to 4 times a month 2. How many drinks containing alcohol do you have on a typical day when you are drinking?: 1 or 2 3. How often do you have six or more drinks on one occasion?: Never AUDIT-C Score: 2 4. How often during the last year have you found that you were not able to stop drinking once you had started?: Never 5. How often during the last year have you failed to do what was normally expected from you because of drinking?: Never 6. How often during the last year have you needed a first drink in the morning to get yourself going after a heavy drinking session?: Never 7. How often during the last year have you had a feeling of guilt of remorse after drinking?: Never 8. How often during the last year have you been unable to remember what happened the night before because you had been drinking?: Never 9. Have you or someone else been injured as a result of your drinking?: No 10. Has a relative or friend or a doctor or another health worker been concerned about your drinking or suggested you cut down?: No Alcohol Use Disorder Identification Test Final Score (AUDIT): 2 Substance Abuse History in the last 12 months:  No. Consequences of Substance  Abuse: NA Previous Psychotropic Medications: Yes  Psychological Evaluations: Yes  Past Medical History:  Past Medical History:  Diagnosis Date   Allergy    Anxiety    Chest pain    Depression    DJD (degenerative joint disease), cervical    postiton with pillow under knees, cant turn neck    Dysentery, amebic, acute 08/31/1979   GERD (gastroesophageal reflux disease)    H/O bronchitis    H/O malaria 08/30/1982   Hearing loss    bilateral    Hypertension    labile Blood pressure   Inguinal hernia    Insomnia    early morning awakening   MVP (mitral valve prolapse)    "no problems"   Perianal pain    Personal history of colonic polyps    Tinnitus    right ear    Past Surgical History:  Procedure Laterality Date   CARPAL TUNNEL RELEASE  10/06, 5/10   right wrist    CARPAL TUNNEL RELEASE  3/10   left wrist    cervical spine discectomy  09/2005   COLONOSCOPY WITH PROPOFOL N/A 10/21/2015   Procedure: COLONOSCOPY WITH PROPOFOL;  Surgeon: [name], MD;  Location: [facility];  Service: Endoscopy;  Laterality: N/A;   INGUINAL HERNIA REPAIR  08/16/2011   Procedure: HERNIA REPAIR INGUINAL ADULT;  Surgeon: [name], MD;  Location: [facility];  Service: General;  Laterality: Left;   INGUINAL HERNIA REPAIR Right 05/03/2014   Procedure: OPEN RIGHT INGUINAL HERNIA REPAIR WITH MESH;  Surgeon: [name], MD;  Location: [facility];  Service: General;  Laterality: Right;   INGUINAL HERNIA REPAIR Left 08/20/2022   Procedure: HERNIA REPAIR INGUINAL ADULT;  Surgeon: [name], MD;  Location: [facility];  Service: General;  Laterality: Left;   INSERTION OF MESH Right 05/03/2014   Procedure: INSERTION OF MESH;  Surgeon: [name], MD;  Location: [facility];  Service: General;  Laterality: Right;   TONSILLECTOMY  1960   Family History:  Family History  Problem Relation Age of Onset   Cancer Mother        breat cancer,lung cancer   Intracerebral hemorrhage Father     Family Psychiatric  History: Unremarkable Tobacco Screening:  Social History   Tobacco Use  Smoking Status Never  Smokeless Tobacco Never    BH Tobacco Counseling     Are you interested in Tobacco Cessation Medications?  N/A, patient does not use tobacco products Counseled patient on smoking cessation:  N/A, patient does not use tobacco products Reason Tobacco Screening Not Completed: No value filed.       Social History:  Social History   Substance and Sexual Activity  Alcohol Use Yes   Comment: 2 wine daily     Social History   Substance and Sexual Activity  Drug Use Not Currently   Types: Marijuana   Comment: weekend use    Additional Social History:                           Allergies:   Allergies  Allergen Reactions   Ciprofloxacin Other (See Comments)    Other Reaction(s): Dizziness (intolerance)   Citalopram Other (See Comments)   Duloxetine Other (See Comments)    Manic emotions  Other Reaction(s): Other (See Comments)    Manic emotions   Duloxetine Hcl Other (See Comments)    Manic emotions   Fluticasone-Umeclidin-Vilant Other (See Comments)    Dry cracked lips and mouth   Gabapentin Other (See Comments)    Unable to urinate, drowsiness  Unable to urinate, drowsiness    Other Reaction(s): Other (See Comments)   Oxcarbazepine Other (See Comments)    Bad taste in his mouth  Other Reaction(s): Other (See Comments)    Bad taste in his mouth   Lab Results: No results found for this or any previous visit (from the past 48 hour(s)).  Blood Alcohol level:  Lab Results  Component Value Date   ETH <10 11/17/2022   ETH <10 123XX123    Metabolic Disorder Labs:  Lab Results  Component Value Date   HGBA1C 5.5 02/28/2019   MPG 111.15 02/28/2019   No results found for: "PROLACTIN" Lab Results  Component Value Date   CHOL 175 02/28/2019   TRIG 29 02/28/2019   HDL 78 02/28/2019   CHOLHDL 2.2 02/28/2019   VLDL 6 02/28/2019    LDLCALC 91 02/28/2019   LDLCALC (H) 02/02/2008    104        Total Cholesterol/HDL:CHD Risk Coronary Heart  Disease Risk Table                     Men   Women  1/2 Average Risk   3.4   3.3    Current Medications: Current Facility-Administered Medications  Medication Dose Route Frequency Provider Last Rate Last Admin   acetaminophen (TYLENOL) tablet 650 mg  650 mg Oral Q6H PRN [name], [name], DO       alum & mag hydroxide-simeth (MAALOX/MYLANTA) 200-200-20 MG/5ML suspension 30 mL  30 mL Oral Q4H PRN [name], [name], DO       busPIRone (BUSPAR) tablet 20 mg  20 mg Oral TID [name], NP   20 mg at 11/21/22 2221   diphenhydrAMINE (BENADRYL) capsule 50 mg  50 mg Oral TID PRN [name] L, DO       Or   diphenhydrAMINE (BENADRYL) injection 50 mg  50 mg Intramuscular TID PRN [name] L, DO       feeding supplement (ENSURE ENLIVE / ENSURE PLUS) liquid 237 mL  237 mL Oral TID BM [name], [name], DO   237 mL at 11/22/22 1010   haloperidol (HALDOL) tablet 5 mg  5 mg Oral TID PRN [name] L, DO   5 mg at 11/21/22 2142   Or   haloperidol lactate (HALDOL) injection 5 mg  5 mg Intramuscular TID PRN [name] L, DO       LORazepam (ATIVAN) tablet 2 mg  2 mg Oral TID PRN [name] L, DO   2 mg at 11/21/22 2142   Or   LORazepam (ATIVAN) injection 2 mg  2 mg Intramuscular TID PRN [name] L, DO       magnesium hydroxide (MILK OF MAGNESIA) suspension 30 mL  30 mL Oral Daily PRN [name] L, DO       mirtazapine (REMERON) tablet 30 mg  30 mg Oral QHS Dixon, Rashaun M, NP   30 mg at 11/21/22 2221   multivitamin with minerals tablet 1 tablet  1 tablet Oral Daily [name], [name], DO       traZODone (DESYREL) tablet 50 mg  50 mg Oral QHS Dixon, Rashaun M, NP   50 mg at 11/21/22 2221   PTA Medications: Medications Prior to Admission  Medication Sig Dispense Refill Last Dose   Alpha-Lipoic Acid 600 MG CAPS Take 600 mg by mouth  daily.      amLODipine (NORVASC) 5 MG tablet Take 2 tablets (10 mg total) by mouth daily. 90 tablet 1    atorvastatin (LIPITOR) 10 MG tablet Take 10 mg by mouth 3 (three) times a week. MWF      busPIRone (BUSPAR) 10 MG tablet Take 20 mg by mouth 3 (three) times daily.       Cholecalciferol (VITAMIN D3) 125 MCG (5000 UT) TABS Take 5,000 Units by mouth daily.      Coenzyme Q10 50 MG CAPS Take 50 mg by mouth daily.      cyclobenzaprine (FLEXERIL) 10 MG tablet Take 1 tablet (10 mg total) by mouth 2 (two) times daily as needed for muscle spasms. 20 tablet 0    docusate sodium (COLACE) 100 MG capsule Take 1 capsule (100 mg total) by mouth every 12 (twelve) hours. 60 capsule 0    hydrOXYzine (ATARAX) 25 MG tablet Take 1 tablet (25 mg total) by mouth every 8 (eight) hours as needed. 12 tablet 0    LORazepam (ATIVAN) 1 MG tablet Take  1 mg by mouth 2 (two) times daily.      losartan (COZAAR) 100 MG tablet Take 100 mg by mouth daily.      meloxicam (MOBIC) 7.5 MG tablet Take 7.5 mg by mouth daily.      mirtazapine (REMERON) 30 MG tablet Take 30 mg by mouth at bedtime.      Multiple Vitamin (MULTIVITAMIN) tablet Take 1 tablet by mouth daily.      Omega-3 1000 MG CAPS Take 1,000 mg by mouth daily.      polyethylene glycol (MIRALAX) 17 g packet Take 17 g by mouth 2 (two) times daily. 14 each 0    pregabalin (LYRICA) 75 MG capsule Take 75 mg by mouth 2 (two) times daily.      senna-docusate (SENOKOT-S) 8.6-50 MG tablet Take 1 tablet by mouth at bedtime as needed for mild constipation. 30 tablet 0    traZODone (DESYREL) 50 MG tablet Take 50-100 mg by mouth at bedtime as needed for sleep.      zaleplon (SONATA) 10 MG capsule Take 10-20 mg by mouth at bedtime as needed for sleep.      zinc gluconate 50 MG tablet Take 50 mg by mouth daily.       Musculoskeletal: Strength & Muscle Tone: within normal limits Gait & Station: normal Patient leans: N/A            Psychiatric Specialty  Exam:  Presentation  General Appearance:  Appropriate for Environment; Casual  Eye Contact: Good  Speech: Clear and Coherent; Normal Rate  Speech Volume: Normal  Handedness: Right   Mood and Affect  Mood: Euthymic  Affect: Appropriate; Congruent   Thought Process  Thought Processes: Irrevelant  Duration of Psychotic Symptoms:N/A Past Diagnosis of Schizophrenia or Psychoactive disorder: Yes  Descriptions of Associations:Intact  Orientation:Full (Time, Place and Person)  Thought Content:Paranoid Ideation; Perseveration; Tangential; Delusions  Hallucinations:No data recorded Ideas of Reference:Delusions; Paranoia; Percusatory  Suicidal Thoughts:No data recorded Homicidal Thoughts:No data recorded  Sensorium  Memory: Immediate Fair; Recent Fair; Remote Fair  Judgment: Fair  Insight: Fair   Materials engineer: Fair  Attention Span: Good  Recall: Good  Fund of Knowledge: Good  Language: Fair   Psychomotor Activity  Psychomotor Activity:No data recorded  Assets  Assets: Social Support; Armed forces logistics/support/administrative officer; Desire for Improvement; Housing; Transportation; Chartered certified accountant; Leisure Time; Physical Health; Financial Resources/Insurance; Resilience; Talents/Skills   Sleep  Sleep:No data recorded   Physical Exam: Physical Exam Vitals and nursing note reviewed.  Constitutional:      Appearance: Normal appearance. He is normal weight.  HENT:     Head: Normocephalic and atraumatic.     Nose: Nose normal.     Mouth/Throat:     Pharynx: Oropharynx is clear.  Eyes:     Extraocular Movements: Extraocular movements intact.     Pupils: Pupils are equal, round, and reactive to light.  Cardiovascular:     Rate and Rhythm: Normal rate and regular rhythm.     Pulses: Normal pulses.     Heart sounds: Normal heart sounds.  Pulmonary:     Effort: Pulmonary effort is normal.     Breath sounds: Normal breath sounds.   Abdominal:     General: Abdomen is flat. Bowel sounds are normal.     Palpations: Abdomen is soft.  Musculoskeletal:        General: Normal range of motion.     Cervical back: Normal range of motion and neck supple.  Skin:  General: Skin is warm and dry.  Neurological:     General: No focal deficit present.     Mental Status: He is alert and oriented to person, place, and time.  Psychiatric:        Attention and Perception: Attention and perception normal.        Mood and Affect: Mood normal. Affect is flat.        Speech: Speech normal.        Behavior: Behavior normal. Behavior is cooperative.        Thought Content: Thought content is paranoid and delusional.        Cognition and Memory: Cognition and memory normal.        Judgment: Judgment normal.    Review of Systems  Constitutional: Negative.   HENT: Negative.    Eyes: Negative.   Respiratory: Negative.    Cardiovascular: Negative.   Gastrointestinal: Negative.   Genitourinary: Negative.   Musculoskeletal: Negative.   Skin: Negative.   Neurological: Negative.   Endo/Heme/Allergies: Negative.   Psychiatric/Behavioral:  Positive for depression.    Blood pressure 110/81, pulse 70, temperature 98.3 F (36.8 C), temperature source Oral, resp. rate 18, height 5\' 9"  (1.753 m), weight 58 kg, SpO2 99 %. Body mass index is 18.88 kg/m.  Treatment Plan Summary: Daily contact with patient to assess and evaluate symptoms and progress in treatment, Medication management, and Plan see orders  Observation Level/Precautions:  15 minute checks  Laboratory:  CBC Chemistry Profile  Psychotherapy:    Medications:    Consultations:    Discharge Concerns:    Estimated LOS:  Other:     Physician Treatment Plan for Primary Diagnosis: Severe recurrent major depressive disorder with psychotic features ([place]) Long Term Goal(s): Improvement in symptoms so as ready for discharge  Short Term Goals: Ability to identify changes in  lifestyle to reduce recurrence of condition will improve, Ability to verbalize feelings will improve, Ability to disclose and discuss suicidal ideas, Ability to demonstrate self-control will improve, Ability to identify and develop effective coping behaviors will improve, Ability to maintain clinical measurements within normal limits will improve, and Compliance with prescribed medications will improve  Physician Treatment Plan for Secondary Diagnosis: Principal Problem:   Severe recurrent major depressive disorder with psychotic features ([place])   I certify that inpatient services furnished can reasonably be expected to improve the patient's condition.    [name], DO 3/25/202410:24 AM

## 2022-11-22 NOTE — Progress Notes (Signed)
Patient is A+O x 4. He denies SI/HI/AVH. He denies depression. Endorses anxiety. "I'm anxious about my son trying to take over my life. And I keep calling my wife but the line continues to go to voicemail." Engaged in active listening and encouragement. Patient complains of 10/10 back pain but refuses PRN medication. Distraction technique provided. Patient says that he is constipated but refuses Milk of Magnesia PRN.  Patient became agitated, irritated, and restless when asking about why his home medications are not readily available. "This is cruel and unusual punishment." Adm Haldol 5mg  PRN and Ativan 2mg  PRN at 2142. Support and reassurance provided. Upon follow up of Haldol and Ativan, the meds were ineffective.  Provider ordered Buspar 20mg , Remeron 30 mg, and Trazodone 50 mg as these meds were on his home medication list. All medications taken whole without any issues. Patient was found to be resting after these meds were given.  Q15 minute unit checks in place.

## 2022-11-22 NOTE — Group Note (Signed)
Recreation Therapy Group Note   Group Topic:Leisure Education  Group Date: 11/22/2022 Start Time: 1400 End Time: 1450 Facilitators: Vilma Prader, LRT, CTRS Location: Courtyard  Group Description: Leisure. Patients were given the opportunity to paint, play cards, listen to music, or go outside to the courtyard. Collectively, pts chose to go outside to the courtyard to get fresh air and sunlight. Pt identified and conversated about things they enjoy doing in their free time and how they can continue to do that outside of the hospital.  Affect/Mood: Labile   Participation Level: Moderate   Participation Quality: Independent   Behavior: Appropriate   Speech/Thought Process: Coherent   Insight: Fair   Judgement: Fair    Modes of Intervention: Guided Discussion and Music   Patient Response to Interventions:  Receptive   Education Outcome:  In group clarification offered    Clinical Observations/Individualized Feedback: Nellie was somewhat active in their participation of session activities and group discussion. Pt identified that he enjoys "rock and roll music". Pt shared that he felt like he could not breathe and wanted to go back inside about halfway through group. RN and MHT shared that this is most likely somatic and that he has been complaining of this all day. RN aware. Pt came in and ate a snack and drank a drink, per RN.    Plan: Continue to engage patient in RT group sessions 2-3x/week.   Vilma Prader, LRT, CTRS 11/22/2022 3:28 PM

## 2022-11-22 NOTE — Plan of Care (Signed)

## 2022-11-22 NOTE — Group Note (Signed)
Date:  11/22/2022 Time:  12:49 AM  Group Topic/Focus:  Personal Choices and Values:   The focus of this group is to help patients assess and explore the importance of values in their lives, how their values affect their decisions, how they express their values and what opposes their expression.    Participation Level:  Minimal  Participation Quality:  Attentive  Affect:  Anxious  Cognitive:  Oriented  Insight: Lacking  Engagement in Group:  Engaged  Modes of Intervention:  Support  Additional Comments:    Bradd Canary 11/22/2022, 12:49 AM

## 2022-11-22 NOTE — Progress Notes (Signed)
   11/22/22 0800  Psych Admission Type (Psych Patients Only)  Admission Status Involuntary  Psychosocial Assessment  Eye Contact Brief  Facial Expression Anxious;Worried  Affect Anxious;Fearful  Speech Logical/coherent  Interaction Demanding;Sarcastic  Motor Activity Unsteady  Appearance/Hygiene Disheveled  Thought Process  Coherency Circumstantial  Content Hypochondria;Preoccupation  Delusions Somatic  Perception WDL  Hallucination None reported or observed  Judgment Impaired  Confusion Mild  Danger to Self  Current suicidal ideation? Denies  Danger to Others  Danger to Others None reported or observed

## 2022-11-22 NOTE — Progress Notes (Signed)
NUTRITION ASSESSMENT  Pt identified as at risk on the Malnutrition Screen Tool  INTERVENTION:  -Ensure Enlive po TID, each supplement provides 350 kcal and 20 grams of protein -MVI with minerals daily -Continue regular diet  NUTRITION DIAGNOSIS: Unintentional weight loss related to sub-optimal intake as evidenced by pt report.   Goal: Pt to meet >/= 90% of their estimated nutrition needs.  Monitor:  PO intake  Assessment:   Pt IVC.   Pt currently on a regular diet. No meal completion data available to assess at this time. PTA pt was decreased oral intake secondary to malfunctioning refrigerator and has been relying on shelf stable foods.   Pt was seen recently on admission to [facility] (discharged on 3/24) and was diagnosed with severe malnutrition. Suspect continued malnutrition.   Reviewed wt hx; pt has experienced a 9.2% wt loss over the past 3 months, which is significant for time frame.   Noted pt with constipation and is receiving PRN milk of magnesia per nursing.   Medications reviewed and include remeron.   Labs reviewed: CBGS: 101.   67 y.o. male  Height: Ht Readings from Last 1 Encounters:  11/21/22 5\' 9"  (1.753 m)    Weight: Wt Readings from Last 1 Encounters:  11/21/22 58 kg    Weight Hx: Wt Readings from Last 10 Encounters:  11/21/22 58 kg  11/18/22 58.9 kg  11/17/22 56.7 kg  11/14/22 56.7 kg  11/14/22 56.7 kg  11/08/22 56.7 kg  11/06/22 60.7 kg  11/05/22 60.7 kg  11/02/22 58 kg  11/02/22 59 kg    BMI:  Body mass index is 18.88 kg/m. BMI WDL.   Estimated Nutritional Needs: Kcal: 25-30 kcal/kg Protein: > 1 gram protein/kg Fluid: 1 ml/kcal  Diet Order:  Diet Order             Diet regular Room service appropriate? Yes; Fluid consistency: Thin  Diet effective now                  Pt is also offered choice of unit snacks mid-morning and mid-afternoon.  Pt is eating as desired.   Lab results and medications  reviewed.   Loistine Chance, RD, LDN, [place] Registered Dietitian II Certified Diabetes Care and Education Specialist Please refer to [facility] for RD and/or RD on-call/weekend/after hours pager

## 2022-11-23 DIAGNOSIS — F333 Major depressive disorder, recurrent, severe with psychotic symptoms: Secondary | ICD-10-CM | POA: Diagnosis not present

## 2022-11-23 MED ORDER — TRAZODONE HCL 50 MG PO TABS
50.0000 mg | ORAL_TABLET | Freq: Once | ORAL | Status: AC
  Administered 2022-11-23: 50 mg via ORAL
  Filled 2022-11-23: qty 1

## 2022-11-23 NOTE — Progress Notes (Signed)
Patient is requesting to not be forwarded calls from his son who has the patient code.

## 2022-11-23 NOTE — Group Note (Signed)
Date:  11/23/2022 Time:  2:26 PM  Group Topic/Focus:  Group Discussion about leisure and interests.    Participation Level:  Active  Participation Quality:  Appropriate  Affect:  Appropriate  Cognitive:  Appropriate  Insight: Appropriate  Engagement in Group:  Engaged  Modes of Intervention:  Discussion, Socialization, and Support  Additional Comments:    Dorena Bodo 11/23/2022, 2:26 PM
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
## 2022-11-23 NOTE — Progress Notes (Signed)
Pt ambulating slowly to the desk without his walker. Pt educated on the need to use his walker. "I don't need it." Pt told that he has been identified as a high fall risk with an unsteady gait. Pt refused teaching. Pt states he cannot sleep tonight. "I can't seem to get to sleep. I normally take 12.5 of Ambien, 30 of Remeron and 100 of trazodone. Can I get another dose of trazodone?" Provider notified. Awaiting new orders.

## 2022-11-23 NOTE — Progress Notes (Signed)
Pt in hallway without walker. Pt states that PT told him he didn't have to use it. Pt educated on need for walker. Pt refusing to use walker at this time. Will continue to monitor.

## 2022-11-23 NOTE — Evaluation (Signed)
Physical Therapy Evaluation Patient Details Name: Brad [name] MRN: [id number] DOB: 1956-06-28 Today's Date: 11/23/2022  History of Present Illness  Pt is a 67 y.o. male transferred to inpatient psychiatry 11/21/22 for depression with psychotic features (pt has been to emergency room about 30 times over last couple months and has been diagnosed with anxiety, somatization disorder, malingering, and depression per notes).  PMH includes anxiety, chest pain, dysentery, h/o malaria, B hearing loss, labile blood pressure, inguinal hernia, insomnia, MVP, perianal pain, tinnitus R ear, CTR B.  Clinical Impression  Prior to hospital admission, pt was independent with functional mobility; lives alone in 1 level home with 3 STE R railing.  Pt reporting 10/10 low back and R knee/shin pain at rest beginning/end of session (pt reports pain did not change during sessions activities)--nurse notified of pt's request for stronger pain meds.  Currently pt is independent with bed mobility; independent with transfers; modified independent ambulating 40 feet with RW use; independent ambulating 200 feet no AD use.  No loss of balance ambulating (no AD use) and looking R/L/up/down.  Pt appearing steady walking with or without RW use.  Trialed pt with RW d/t nursing reporting concerns of pt being a high fall risk but pt appearing very frustrated when asked to walk with walker but then pt verbalizing concerns about walking without walker.  Anticipate seeing pt for 1-2 sessions for higher level balance/strengthening but no further PT needs anticipated upon hospital discharge.   Recommendations for follow up therapy are one component of a multi-disciplinary discharge planning process, led by the attending physician.  Recommendations may be updated based on patient status, additional functional criteria and insurance authorization.       Assistance Recommended at Discharge PRN  Patient can return home with the following  Assist  for transportation    Equipment Recommendations Rolling walker (2 wheels) (pending progress)  Recommendations for Other Services       Functional Status Assessment Patient has had a recent decline in their functional status and/or demonstrates limited ability to make significant improvements in function in a reasonable and predictable amount of time     Precautions / Restrictions Precautions Precautions: Fall Restrictions Weight Bearing Restrictions: No      Mobility  Bed Mobility Overal bed mobility: Independent             General bed mobility comments: Supine to/from sitting without any noted difficulties.    Transfers Overall transfer level: Independent Equipment used: None               General transfer comment: steady safe transfers    Ambulation/Gait Ambulation/Gait assistance: Independent Gait Distance (Feet):  (40 feet with RW; 200 feet no AD use) Assistive device: None, Rolling walker (2 wheels) Gait Pattern/deviations: Step-through pattern Gait velocity: mildly increased     General Gait Details: steady with RW or no AD use  Stairs            Wheelchair Mobility    Modified Rankin (Stroke Patients Only)       Balance Overall balance assessment: Needs assistance Sitting-balance support: No upper extremity supported, Feet supported Sitting balance-Leahy Scale: Normal Sitting balance - Comments: steady sitting reaching outside BOS   Standing balance support: No upper extremity supported, During functional activity Standing balance-Leahy Scale: Good Standing balance comment: no loss of balance noted with ambulation  Pertinent Vitals/Pain Pain Assessment Pain Assessment: 0-10 Pain Score: 10-Worst pain ever Pain Location: back and R knee/shin Pain Descriptors / Indicators: Aching, Discomfort Pain Intervention(s): Limited activity within patient's tolerance, Monitored during session,  Repositioned, Other (comment) (RN notified of pt's pain and pt's request for stronger pain meds) Vitals (HR and O2 on room air) stable and WFL throughout treatment session.    Home Living Family/patient expects to be discharged to:: Private residence Living Arrangements: Alone Available Help at Discharge: Other (Comment) (Pt reports no help at home) Type of Home: House Home Access: Stairs to enter Entrance Stairs-Rails: Right Entrance Stairs-Number of Steps: 3   Home Layout: One level Home Equipment: None      Prior Function Prior Level of Function : Independent/Modified Independent                     Hand Dominance        Extremity/Trunk Assessment   Upper Extremity Assessment Upper Extremity Assessment: Overall WFL for tasks assessed    Lower Extremity Assessment Lower Extremity Assessment: Overall WFL for tasks assessed    Cervical / Trunk Assessment Cervical / Trunk Assessment: Normal  Communication   Communication: No difficulties  Cognition Arousal/Alertness: Awake/alert Behavior During Therapy: Anxious (pt appearing agitated when asked to use walker) Overall Cognitive Status: Within Functional Limits for tasks assessed                                 General Comments: A&Ox4.  Pt appearing anxious.        General Comments  Nursing cleared pt for participation in physical therapy.  Pt agreeable to PT session.    Exercises     Assessment/Plan    PT Assessment Patient needs continued PT services  PT Problem List Decreased strength;Decreased activity tolerance;Decreased mobility;Decreased balance;Pain       PT Treatment Interventions DME instruction;Gait training;Functional mobility training;Therapeutic activities;Therapeutic exercise;Balance training;Patient/family education    PT Goals (Current goals can be found in the Care Plan section)  Acute Rehab PT Goals Patient Stated Goal: to improve status PT Goal Formulation: With  patient Time For Goal Achievement: 12/07/22 Potential to Achieve Goals: Fair    Frequency Min 2X/week (anticipate 1-2 more sessions)     Co-evaluation               AM-PAC PT "6 Clicks" Mobility  Outcome Measure Help needed turning from your back to your side while in a flat bed without using bedrails?: None Help needed moving from lying on your back to sitting on the side of a flat bed without using bedrails?: None Help needed moving to and from a bed to a chair (including a wheelchair)?: None Help needed standing up from a chair using your arms (e.g., wheelchair or bedside chair)?: None Help needed to walk in hospital room?: None Help needed climbing 3-5 steps with a railing? : None 6 Click Score: 24    End of Session   Activity Tolerance: Patient tolerated treatment well Patient left: in bed;with call bell/phone within reach Nurse Communication: Mobility status;Precautions PT Visit Diagnosis: Other abnormalities of gait and mobility (R26.89);Pain Pain - Right/Left: Right Pain - part of body: Knee;Leg    Time: [id number] PT Time Calculation (min) (ACUTE ONLY): 13 min   Charges:   PT Evaluation $PT Eval Low Complexity: 1 Low         Neima Lacross, PT 11/23/22, 12:05 PM

## 2022-11-23 NOTE — Group Note (Signed)
Date:  11/23/2022 Time:  8:58 PM  Group Topic/Focus:  Overcoming Stress:   The focus of this group is to define stress and help patients assess their triggers.    Participation Level:  Active  Participation Quality:  Attentive  Affect:  Flat  Cognitive:  Oriented  Insight: Improving  Engagement in Group:  Improving  Modes of Intervention:  Discussion  Additional Comments:  Adult Unit Workbook pg.57-60  Neville Route 11/23/2022, 8:58 PM

## 2022-11-23 NOTE — Progress Notes (Signed)
Pt given one-time additional dose of Trazodone 50 mg.

## 2022-11-23 NOTE — Progress Notes (Signed)
[facility] MD Progress Note  11/23/2022 12:51 PM Brad [name]  MRN:  [id number] Subjective: Brad [name] is seen on rounds.  He has been compliant with his medications including Abilify which was started yesterday.  He denies having any side effects.  He states that he does not want to talk to his son because his son is the one that took out commitment papers on him.  Apparently, his son has healthcare power of attorney but not guardianship.  He has been pleasant and cooperative on the unit.  Good controls.  Nurses report no issues.  He states that he slept well.  His thought process appears to be mostly goal directed today. Principal Problem: Severe recurrent major depressive disorder with psychotic features ([place]) Diagnosis: Principal Problem:   Severe recurrent major depressive disorder with psychotic features ([place])  Total Time spent with patient: 15 minutes  Past Psychiatric History: History of depression and anxiety with delusions of mostly somatic in nature.  He sees [name] outpatient.  He has had numerous hospitalizations for his delusions. No history of self-harm.  Past Medical History:  Past Medical History:  Diagnosis Date   Allergy    Anxiety    Chest pain    Depression    DJD (degenerative joint disease), cervical    postiton with pillow under knees, cant turn neck    Dysentery, amebic, acute 08/31/1979   GERD (gastroesophageal reflux disease)    H/O bronchitis    H/O malaria 08/30/1982   Hearing loss    bilateral    Hypertension    labile Blood pressure   Inguinal hernia    Insomnia    early morning awakening   MVP (mitral valve prolapse)    "no problems"   Perianal pain    Personal history of colonic polyps    Tinnitus    right ear    Past Surgical History:  Procedure Laterality Date   CARPAL TUNNEL RELEASE  10/06, 5/10   right wrist    CARPAL TUNNEL RELEASE  3/10   left wrist    cervical spine discectomy   09/2005   COLONOSCOPY WITH PROPOFOL N/A 10/21/2015    Procedure: COLONOSCOPY WITH PROPOFOL;  Surgeon: [name], MD;  Location: [facility];  Service: Endoscopy;  Laterality: N/A;   INGUINAL HERNIA REPAIR  08/16/2011   Procedure: HERNIA REPAIR INGUINAL ADULT;  Surgeon: [name], MD;  Location: [facility];  Service: General;  Laterality: Left;   INGUINAL HERNIA REPAIR Right 05/03/2014   Procedure: OPEN RIGHT INGUINAL HERNIA REPAIR WITH MESH;  Surgeon: [name], MD;  Location: [facility];  Service: General;  Laterality: Right;   INGUINAL HERNIA REPAIR Left 08/20/2022   Procedure: HERNIA REPAIR INGUINAL ADULT;  Surgeon: [name], MD;  Location: [facility];  Service: General;  Laterality: Left;   INSERTION OF MESH Right 05/03/2014   Procedure: INSERTION OF MESH;  Surgeon: [name], MD;  Location: [facility];  Service: General;  Laterality: Right;   TONSILLECTOMY  1960   Family History:  Family History  Problem Relation Age of Onset   Cancer Mother        breat cancer,lung cancer   Intracerebral hemorrhage Father    Family Psychiatric  History: Unremarkable Social History:  Social History   Substance and Sexual Activity  Alcohol Use Yes   Comment: 2 wine daily     Social History   Substance and Sexual Activity  Drug Use Not Currently   Types: Marijuana  Comment: weekend use    Social History   Socioeconomic History   Marital status: Married    Spouse name: Not on file   Number of children: 2   Years of education: Not on file   Highest education level: Professional school degree (e.g., MD, DDS, DVM, JD)  Occupational History   Occupation: psychologist  Tobacco Use   Smoking status: Never   Smokeless tobacco: Never  Vaping Use   Vaping Use: Never used  Substance and Sexual Activity   Alcohol use: Yes    Comment: 2 wine daily   Drug use: Not Currently    Types: Marijuana    Comment: weekend use   Sexual activity: Yes  Other Topics Concern   Not on file  Social History Narrative   Lives in  single level home. He is a self employed Investment banker, operational.     Social Determinants of Health   Financial Resource Strain: Not on file  Food Insecurity: No Food Insecurity (11/21/2022)   Hunger Vital Sign    Worried About Running Out of Food in the Last Year: Never true    Ran Out of Food in the Last Year: Never true  Transportation Needs: No Transportation Needs (11/21/2022)   PRAPARE - Hydrologist (Medical): No    Lack of Transportation (Non-Medical): No  Physical Activity: Not on file  Stress: Not on file  Social Connections: Not on file   Additional Social History:                         Sleep: Good  Appetite:  Good  Current Medications: Current Facility-Administered Medications  Medication Dose Route Frequency Provider Last Rate Last Admin   acetaminophen (TYLENOL) tablet 650 mg  650 mg Oral Q6H PRN [name], [name], DO       alum & mag hydroxide-simeth (MAALOX/MYLANTA) 200-200-20 MG/5ML suspension 30 mL  30 mL Oral Q4H PRN [name], [name], DO       ARIPiprazole (ABILIFY) tablet 5 mg  5 mg Oral Daily [name], DO   5 mg at 11/23/22 1057   busPIRone (BUSPAR) tablet 20 mg  20 mg Oral TID [name], NP   20 mg at 11/23/22 1057   diphenhydrAMINE (BENADRYL) capsule 50 mg  50 mg Oral TID PRN [name] L, DO       Or   diphenhydrAMINE (BENADRYL) injection 50 mg  50 mg Intramuscular TID PRN [name] L, DO       feeding supplement (ENSURE ENLIVE / ENSURE PLUS) liquid 237 mL  237 mL Oral TID BM [name], [name], DO   237 mL at 11/23/22 1057   haloperidol (HALDOL) tablet 5 mg  5 mg Oral TID PRN [name] L, DO   5 mg at 11/21/22 2142   Or   haloperidol lactate (HALDOL) injection 5 mg  5 mg Intramuscular TID PRN [name] L, DO       LORazepam (ATIVAN) tablet 2 mg  2 mg Oral BID [name], DO   2 mg at 11/23/22 1057   magnesium hydroxide (MILK OF MAGNESIA)  suspension 30 mL  30 mL Oral Daily PRN [name] L, DO   30 mL at 11/22/22 1216   mirtazapine (REMERON) tablet 30 mg  30 mg Oral QHS Dixon, Rashaun M, NP   30 mg at 11/22/22 2137   multivitamin with minerals tablet 1 tablet  1  tablet Oral Daily [name] L, DO   1 tablet at 11/23/22 1057   traZODone (DESYREL) tablet 50 mg  50 mg Oral QHS [name], NP   50 mg at 11/22/22 2137   zolpidem (AMBIEN) tablet 5 mg  5 mg Oral QHS [name], DO   5 mg at 11/22/22 2146    Lab Results: No results found for this or any previous visit (from the past 48 hour(s)).  Blood Alcohol level:  Lab Results  Component Value Date   ETH <10 11/17/2022   ETH <10 123XX123    Metabolic Disorder Labs: Lab Results  Component Value Date   HGBA1C 5.5 02/28/2019   MPG 111.15 02/28/2019   No results found for: "PROLACTIN" Lab Results  Component Value Date   CHOL 175 02/28/2019   TRIG 29 02/28/2019   HDL 78 02/28/2019   CHOLHDL 2.2 02/28/2019   VLDL 6 02/28/2019   LDLCALC 91 02/28/2019   LDLCALC (H) 02/02/2008    104        Total Cholesterol/HDL:CHD Risk Coronary Heart Disease Risk Table                     Men   Women  1/2 Average Risk   3.4   3.3    Physical Findings: AIMS:  , ,  ,  ,    CIWA:    COWS:     Musculoskeletal: Strength & Muscle Tone: within normal limits Gait & Station: normal Patient leans: N/A  Psychiatric Specialty Exam:  Presentation  General Appearance:  Appropriate for Environment; Casual  Eye Contact: Good  Speech: Clear and Coherent; Normal Rate  Speech Volume: Normal  Handedness: Right   Mood and Affect  Mood: Euthymic  Affect: Appropriate; Congruent   Thought Process  Thought Processes: Irrevelant  Descriptions of Associations:Intact  Orientation:Full (Time, Place and Person)  Thought Content:Paranoid Ideation; Perseveration; Tangential; Delusions  History of Schizophrenia/Schizoaffective  disorder:Yes  Duration of Psychotic Symptoms:Greater than six months  Hallucinations:No data recorded Ideas of Reference:Delusions; Paranoia; Percusatory  Suicidal Thoughts:No data recorded Homicidal Thoughts:No data recorded  Sensorium  Memory: Immediate Fair; Recent Fair; Remote Fair  Judgment: Fair  Insight: Fair   Materials engineer: Fair  Attention Span: Good  Recall: Good  Fund of Knowledge: Good  Language: Fair   Psychomotor Activity  Psychomotor Activity:No data recorded  Assets  Assets: Social Support; Armed forces logistics/support/administrative officer; Desire for Improvement; Housing; Transportation; Chartered certified accountant; Leisure Time; Physical Health; Financial Resources/Insurance; Resilience; Talents/Skills   Sleep  Sleep:No data recorded   Physical Exam: Physical Exam Vitals and nursing note reviewed.  Constitutional:      Appearance: Normal appearance. He is normal weight.  Neurological:     General: No focal deficit present.     Mental Status: He is alert and oriented to person, place, and time.  Psychiatric:        Attention and Perception: Attention and perception normal.        Mood and Affect: Mood normal. Affect is labile.        Speech: Speech normal.        Behavior: Behavior normal. Behavior is cooperative.        Thought Content: Thought content is paranoid.        Cognition and Memory: Cognition and memory normal.        Judgment: Judgment is inappropriate.    Review of Systems  Constitutional: Negative.   HENT: Negative.  Eyes: Negative.   Respiratory: Negative.    Cardiovascular: Negative.   Gastrointestinal: Negative.   Genitourinary: Negative.   Musculoskeletal: Negative.   Skin: Negative.   Neurological: Negative.   Endo/Heme/Allergies: Negative.   Psychiatric/Behavioral:  Positive for depression. The patient is nervous/anxious.    Blood pressure 110/81, pulse (!) 59, temperature 98.7 F (37.1 C), temperature  source Oral, resp. rate 15, height 5\' 9"  (1.753 m), weight 58 kg, SpO2 99 %. Body mass index is 18.88 kg/m.   Treatment Plan Summary: Daily contact with patient to assess and evaluate symptoms and progress in treatment, Medication management, and Plan continue current medications.  [name], DO 11/23/2022, 12:51 PM
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
## 2022-11-23 NOTE — Plan of Care (Signed)
  Problem: Coping: Goal: Level of anxiety will decrease Outcome: Not Progressing   Problem: Education: Goal: Ability to state activities that reduce stress will improve Outcome: Not Progressing   Problem: Coping: Goal: Ability to identify and develop effective coping behavior will improve Outcome: Not Progressing

## 2022-11-23 NOTE — Progress Notes (Signed)
   11/23/22 2000  Psych Admission Type (Psych Patients Only)  Admission Status Involuntary  Psychosocial Assessment  Patient Complaints Irritability  Eye Contact Fair  Facial Expression Sullen  Affect Irritable  Speech Logical/coherent  Interaction Demanding;Sarcastic;Assertive  Motor Activity Slow  Appearance/Hygiene In scrubs  Behavior Characteristics Irritable;Cooperative;Impulsive  Mood Irritable  Thought Process  Coherency Circumstantial  Content Hypochondria  Delusions Somatic  Perception WDL  Hallucination None reported or observed  Judgment Impaired  Confusion Mild  Danger to Self  Current suicidal ideation? Denies  Danger to Others  Danger to Others None reported or observed   Progress note   D: Pt seen in hallway. Pt will ambulate with walker and then leave the walker and move without it. Pt educated more than once on need to use the assistive device. Pt non-compliant. Pt denies SI, HI, AVH. Pt rates pain  10/10. Refuses PRN pain medication. Wants narcotics. Pt rates anxiety  0/10 and depression  0/10. No other concerns noted at this time.  A: Pt provided support and encouragement. Pt given scheduled medication as prescribed. PRNs as appropriate. Q15 min checks for safety.   R: Pt safe on the unit. Will continue to monitor.

## 2022-11-23 NOTE — Progress Notes (Signed)
Patient is A+O x4. He denies depression and anxiety. He denies SI/HI/AVH. Appetite good. Medication adm whole without any issues. Denies pain.  Hgb A1c and lipid panel scheduled for tomorrow morning 03.27.24  Q15 minute unit checks in place.

## 2022-11-23 NOTE — Group Note (Signed)
[place] LCSW Group Therapy Note   Group Date: 11/23/2022 Start Time: [id number] End Time: 1400   Type of Therapy/Topic:  Group Therapy:  Emotion Regulation  Participation Level:  Minimal   Mood:  Description of Group:    The purpose of this group is to assist patients in learning to regulate negative emotions and experience positive emotions. Patients will be guided to discuss ways in which they have been vulnerable to their negative emotions. These vulnerabilities will be juxtaposed with experiences of positive emotions or situations, and patients challenged to use positive emotions to combat negative ones. Special emphasis will be placed on coping with negative emotions in conflict situations, and patients will process healthy conflict resolution skills.  Therapeutic Goals: Patient will identify two positive emotions or experiences to reflect on in order to balance out negative emotions:  Patient will label two or more emotions that they find the most difficult to experience:  Patient will be able to demonstrate positive conflict resolution skills through discussion or role plays:   Summary of Patient Progress: Patient was present in group.  Patient was observed to be attempting mindfulness skills.  Patient did not engage in discussion.  Patient sat with eyes closed the entire time.  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    Therapeutic Modalities:   Cognitive Behavioral Therapy Feelings Identification Dialectical Behavioral Therapy   Rozann Lesches, LCSW

## 2022-11-23 NOTE — Group Note (Deleted)
Date:  11/23/2022 Time:  1:53 PM  Group Topic/Focus:  Group discussion about leisure and interests     Participation Level:  {BHH PARTICIPATION HD:[id number]  Participation Quality:  {BHH PARTICIPATION QUALITY:22265}  Affect:  {BHH AFFECT:22266}  Cognitive:  {BHH COGNITIVE:22267}  Insight: {BHH Insight2:20797}  Engagement in Group:  {BHH ENGAGEMENT IN J[place]:3131603  Modes of Intervention:  {BHH MODES OF INTERVENTION:22269}  Additional Comments:  ***  Dorena Bodo 11/23/2022, 1:53 PM

## 2022-11-24 DIAGNOSIS — F333 Major depressive disorder, recurrent, severe with psychotic symptoms: Secondary | ICD-10-CM | POA: Diagnosis not present

## 2022-11-24 LAB — LIPID PANEL
Cholesterol: 175 mg/dL (ref 0–200)
HDL: 68 mg/dL (ref >40)
LDL Cholesterol: 90 mg/dL (ref 0–99)
Total CHOL/HDL Ratio: 2.6 RATIO
Triglycerides: 86 mg/dL (ref <150)
VLDL: 17 mg/dL (ref 0–40)

## 2022-11-24 MED ORDER — SENNOSIDES-DOCUSATE SODIUM 8.6-50 MG PO TABS
2.0000 | ORAL_TABLET | Freq: Every day | ORAL | Status: DC
  Administered 2022-11-24 – 2022-11-28 (×5): 2 via ORAL
  Filled 2022-11-24 (×6): qty 2

## 2022-11-24 NOTE — Group Note (Signed)
[facility] LCSW Group Therapy Note    Group Date: 11/24/2022 Start Time: 1300 End Time: 1350  Type of Therapy and Topic:  Group Therapy:  Overcoming Obstacles  Participation Level:  BHH PARTICIPATION LEVEL: Minimal  Mood:  Description of Group:   In this group patients will be encouraged to explore what they see as obstacles to their own wellness and recovery. They will be guided to discuss their thoughts, feelings, and behaviors related to these obstacles. The group will process together ways to cope with barriers, with attention given to specific choices patients can make. Each patient will be challenged to identify changes they are motivated to make in order to overcome their obstacles. This group will be process-oriented, with patients participating in exploration of their own experiences as well as giving and receiving support and challenge from other group members.  Therapeutic Goals: 1. Patient will identify personal and current obstacles as they relate to admission. 2. Patient will identify barriers that currently interfere with their wellness or overcoming obstacles.  3. Patient will identify feelings, thought process and behaviors related to these barriers. 4. Patient will identify two changes they are willing to make to overcome these obstacles:    Summary of Patient Progress Patient was present at the beginning of group.  Patient discussed how his son "needs to go away and never come back".  He also stated "he's trying to break up my marriage".  He identified his son "coercing me to be here and to IVC me" as his obstacle.  Patient stated that "this is too much for me" and left group early on.  Incident terminated without incident.    Therapeutic Modalities:   Cognitive Behavioral Therapy Solution Focused Therapy Motivational Interviewing Relapse Prevention Therapy   Rozann Lesches, LCSW

## 2022-11-24 NOTE — Group Note (Signed)
Recreation Therapy Group Note   Group Topic:Team Building  Group Date: 11/24/2022 Start Time: 1400 End Time: 1430 Facilitators: Vilma Prader, LRT, CTRS Location:  Dayroom  Group Description: Trivia. Patients are split into two groups. LRT reads off trivia question for one team to answer within allotted time. If the first team is unable to answer or answers incorrectly, then the opposite team gets a chance at answering the question. At the end of the game, whichever team answers the most question correctly, wins. LRT facilitated post-game discussion on the importance of working well with others, active listening to others and being a part of a team. LRT and pts discussed how this can apply to life post-discharge.   Affect/Mood: N/A   Participation Level: Did not attend    Clinical Observations/Individualized Feedback: Dareion did not attend group despite encouragement.   Plan: Continue to engage patient in RT group sessions 2-3x/week.   Vilma Prader, LRT, CTRS 11/24/2022 3:07 PM

## 2022-11-24 NOTE — Progress Notes (Signed)
Patient is A+O x4. He presents with irritability about the relationship with his son. He denies SI/HI/AVH. He denies anxiety and depression although his affect is anxious. Appetite good. Medication compliant.  Patient reports having a hard stool today. Senna added to scheduled medications and Milk of Magnesia adm PRN at 1752.  Discharge planning ongoing. Q15 minute unit checks in place.

## 2022-11-24 NOTE — Progress Notes (Signed)
Pt awake and at the nurse's station. "I feel like I'm dehydrated. I'm no longer making saliva. I just woke up and [pt smacking lips] I realized that I couldn't make saliva anymore. What could be the cause? I just know that I'm dehydrated. I drink some water and it gets sucked up right away. I just wanted you to be aware of it because it's never happened before." Pt given a cup of water to drink. Suggested he speak with provider in the morning. Walked back to his room. Will continue to monitor.

## 2022-11-24 NOTE — Group Note (Signed)
Date:  11/24/2022 Time:  11:42 AM  Group Topic/Focus:  Building Self Esteem:   The Focus of this group is helping patients become aware of the effects of self-esteem on their lives, the things they and others do that enhance or undermine their self-esteem, seeing the relationship between their level of self-esteem and the choices they make and learning ways to enhance self-esteem. Goals Group:   The focus of this group is to help patients establish daily goals to achieve during treatment and discuss how the patient can incorporate goal setting into their daily lives to aide in recovery. Self Esteem Action Plan:   The focus of this group is to help patients create a plan to continue to build self-esteem after discharge.    Participation Level:  Active  Participation Quality:  Appropriate and Attentive  Affect:  Appropriate  Cognitive:  Alert and Appropriate  Insight: Appropriate and Good  Engagement in Group:  Engaged  Modes of Intervention:  Activity and Discussion  Additional Comments:    Ladona Mow 11/24/2022, 11:42 AM

## 2022-11-24 NOTE — BHH Counselor (Signed)
CSW spoke with the patient's son.  CSW informed that at this time the patient has rescinded his consent for communication.  CSW requested that HCPOA paperwork be sent to fax.  Assunta Curtis, MSW, LCSW 11/24/2022 4:24 PM

## 2022-11-24 NOTE — Progress Notes (Signed)
Pt back at nurse's station. "My glands are full. I don't have any saliva. This has been occupying me since I last came up." Pt told that this would be passed on to the provider to examine him during day shift. "I just thought you'd want to know if you care about the patients on your unit." Pt walked back to his room.

## 2022-11-24 NOTE — Progress Notes (Signed)
[name] MD Progress Note  11/24/2022 12:39 PM Brad [name]  MRN:  [id number] Subjective: Brad [name] is seen on rounds.  Yesterday Brad [name]'s son Brad [name] wanted to talk to me.  Brad [name] said he does not want me talking to him.  He says he is the one that committed him.  He says that he his son wants to get guardianship.  I told Brad [name] that he has depression and anxiety with some psychotic features that include mostly delusions of somatic nature.  He agreed.  He says that he wants to move to [place] to be with his wife.  So far no problems with the Abilify even though he stated that he had some swallowing problems but he thought that he was just being anxious because he does not have it now.  Other than that, he states that his appetite is good and his sleep is good but he is constipated.  He looked at me and laughed and said there is again with the somatic complaints.  Principal Problem: Severe recurrent major depressive disorder with psychotic features ([place]) Diagnosis: Principal Problem:   Severe recurrent major depressive disorder with psychotic features ([place])  Total Time spent with patient: 15 minutes  Past Psychiatric History: Extensive history of depression and anxiety and somatization.  Past Medical History:  Past Medical History:  Diagnosis Date   Allergy    Anxiety    Chest pain    Depression    DJD (degenerative joint disease), cervical    postiton with pillow under knees, cant turn neck    Dysentery, amebic, acute 08/31/1979   GERD (gastroesophageal reflux disease)    H/O bronchitis    H/O malaria 08/30/1982   Hearing loss    bilateral    Hypertension    labile Blood pressure   Inguinal hernia    Insomnia    early morning awakening   MVP (mitral valve prolapse)    "no problems"   Perianal pain    Personal history of colonic polyps    Tinnitus    right ear    Past Surgical History:  Procedure Laterality Date   CARPAL TUNNEL RELEASE  10/06, 5/10   right wrist    CARPAL TUNNEL RELEASE  3/10    left wrist    cervical spine discectomy   09/2005   COLONOSCOPY WITH PROPOFOL N/A 10/21/2015   Procedure: COLONOSCOPY WITH PROPOFOL;  Surgeon: [name], MD;  Location: [facility];  Service: Endoscopy;  Laterality: N/A;   INGUINAL HERNIA REPAIR  08/16/2011   Procedure: HERNIA REPAIR INGUINAL ADULT;  Surgeon: [name], MD;  Location: [place];  Service: General;  Laterality: Left;   INGUINAL HERNIA REPAIR Right 05/03/2014   Procedure: OPEN RIGHT INGUINAL HERNIA REPAIR WITH MESH;  Surgeon: [name], MD;  Location: [facility];  Service: General;  Laterality: Right;   INGUINAL HERNIA REPAIR Left 08/20/2022   Procedure: HERNIA REPAIR INGUINAL ADULT;  Surgeon: [name], MD;  Location: [facility];  Service: General;  Laterality: Left;   INSERTION OF MESH Right 05/03/2014   Procedure: INSERTION OF MESH;  Surgeon: [name], MD;  Location: [facility];  Service: General;  Laterality: Right;   TONSILLECTOMY  1960   Family History:  Family History  Problem Relation Age of Onset   Cancer Mother        breat cancer,lung cancer   Intracerebral hemorrhage Father    Family Psychiatric  History: Unremarkable Social History:  Social History   Substance and  Sexual Activity  Alcohol Use Yes   Comment: 2 wine daily     Social History   Substance and Sexual Activity  Drug Use Not Currently   Types: Marijuana   Comment: weekend use    Social History   Socioeconomic History   Marital status: Married    Spouse name: Not on file   Number of children: 2   Years of education: Not on file   Highest education level: Professional school degree (e.g., MD, DDS, DVM, JD)  Occupational History   Occupation: psychologist  Tobacco Use   Smoking status: Never   Smokeless tobacco: Never  Vaping Use   Vaping Use: Never used  Substance and Sexual Activity   Alcohol use: Yes    Comment: 2 wine daily   Drug use: Not Currently    Types: Marijuana    Comment: weekend use    Sexual activity: Yes  Other Topics Concern   Not on file  Social History Narrative   Lives in single level home. He is a self employed Investment banker, operational.     Social Determinants of Health   Financial Resource Strain: Not on file  Food Insecurity: No Food Insecurity (11/21/2022)   Hunger Vital Sign    Worried About Running Out of Food in the Last Year: Never true    Ran Out of Food in the Last Year: Never true  Transportation Needs: No Transportation Needs (11/21/2022)   PRAPARE - Hydrologist (Medical): No    Lack of Transportation (Non-Medical): No  Physical Activity: Not on file  Stress: Not on file  Social Connections: Not on file   Additional Social History:                         Sleep: Good  Appetite:  Good  Current Medications: Current Facility-Administered Medications  Medication Dose Route Frequency Provider Last Rate Last Admin   acetaminophen (TYLENOL) tablet 650 mg  650 mg Oral Q6H PRN [name], [name], DO       alum & mag hydroxide-simeth (MAALOX/MYLANTA) 200-200-20 MG/5ML suspension 30 mL  30 mL Oral Q4H PRN [name], [name], DO       ARIPiprazole (ABILIFY) tablet 5 mg  5 mg Oral Daily [name], DO   5 mg at 11/24/22 1119   busPIRone (BUSPAR) tablet 20 mg  20 mg Oral TID [name], NP   20 mg at 11/24/22 1119   diphenhydrAMINE (BENADRYL) capsule 50 mg  50 mg Oral TID PRN [name] L, DO       Or   diphenhydrAMINE (BENADRYL) injection 50 mg  50 mg Intramuscular TID PRN [name] L, DO       feeding supplement (ENSURE ENLIVE / ENSURE PLUS) liquid 237 mL  237 mL Oral TID BM [name], [name], DO   237 mL at 11/24/22 1037   haloperidol (HALDOL) tablet 5 mg  5 mg Oral TID PRN [name] L, DO   5 mg at 11/21/22 2142   Or   haloperidol lactate (HALDOL) injection 5 mg  5 mg Intramuscular TID PRN [name] L, DO       LORazepam (ATIVAN) tablet 2 mg  2 mg Oral BID  [name], DO   2 mg at 11/24/22 1120   magnesium hydroxide (MILK OF MAGNESIA) suspension 30 mL  30 mL Oral Daily PRN [name], [name], DO   30 mL at  11/22/22 1216   mirtazapine (REMERON) tablet 30 mg  30 mg Oral QHS [name], NP   30 mg at 11/23/22 2118   multivitamin with minerals tablet 1 tablet  1 tablet Oral Daily [name] L, DO   1 tablet at 11/24/22 1119   senna-docusate (Senokot-S) tablet 2 tablet  2 tablet Oral QHS [name], DO       traZODone (DESYREL) tablet 50 mg  50 mg Oral QHS [name], NP   50 mg at 11/23/22 2116   zolpidem (AMBIEN) tablet 5 mg  5 mg Oral QHS [name], DO   5 mg at 11/23/22 2116    Lab Results:  Results for orders placed or performed during the hospital encounter of 11/21/22 (from the past 48 hour(s))  Lipid panel     Status: None   Collection Time: 11/24/22  9:06 AM  Result Value Ref Range   Cholesterol 175 0 - 200 mg/dL   Triglycerides 86 <150 mg/dL   HDL 68 >40 mg/dL   Total CHOL/HDL Ratio 2.6 RATIO   VLDL 17 0 - 40 mg/dL   LDL Cholesterol 90 0 - 99 mg/dL    Comment:        Total Cholesterol/HDL:CHD Risk Coronary Heart Disease Risk Table                     Men   Women  1/2 Average Risk   3.4   3.3  Average Risk       5.0   4.4  2 X Average Risk   9.6   7.1  3 X Average Risk  23.4   11.0        Use the calculated Patient Ratio above and the CHD Risk Table to determine the patient's CHD Risk.        ATP III CLASSIFICATION (LDL):  <100     mg/dL   Optimal  100-129  mg/dL   Near or Above                    Optimal  130-159  mg/dL   Borderline  160-189  mg/dL   High  >190     mg/dL   Very High Performed at [facility], [address] [place], [place] [address]     Blood Alcohol level:  Lab Results  Component Value Date   [facility] <10 11/17/2022   ETH <10 [id number]    Metabolic Disorder Labs: Lab Results  Component Value Date   HGBA1C 5.5 02/28/2019    MPG 111.15 02/28/2019   No results found for: "PROLACTIN" Lab Results  Component Value Date   CHOL 175 11/24/2022   TRIG 86 11/24/2022   HDL 68 11/24/2022   CHOLHDL 2.6 11/24/2022   VLDL 17 11/24/2022   LDLCALC 90 11/24/2022   LDLCALC 91 02/28/2019    Physical Findings: AIMS:  , ,  ,  ,    CIWA:    COWS:     Musculoskeletal: Strength & Muscle Tone: within normal limits Gait & Station: normal Patient leans: N/A  Psychiatric Specialty Exam:  Presentation  General Appearance:  Appropriate for Environment; Casual  Eye Contact: Good  Speech: Clear and Coherent; Normal Rate  Speech Volume: Normal  Handedness: Right   Mood and Affect  Mood: Euthymic  Affect: Appropriate; Congruent   Thought Process  Thought Processes: Irrevelant  Descriptions of Associations:Intact  Orientation:Full (Time, Place and Person)  Thought Content:Paranoid Ideation; Perseveration; Tangential; Delusions  History of Schizophrenia/Schizoaffective disorder:Yes  Duration of Psychotic Symptoms:Greater than six months  Hallucinations:No data recorded Ideas of Reference:Delusions; Paranoia; Percusatory  Suicidal Thoughts:No data recorded Homicidal Thoughts:No data recorded  Sensorium  Memory: Immediate Fair; Recent Fair; Remote Fair  Judgment: Fair  Insight: Fair   Materials engineer: Fair  Attention Span: Good  Recall: Good  Fund of Knowledge: Good  Language: Fair   Psychomotor Activity  Psychomotor Activity:No data recorded  Assets  Assets: Social Support; Armed forces logistics/support/administrative officer; Desire for Improvement; Housing; Transportation; Chartered certified accountant; Leisure Time; Physical Health; Financial Resources/Insurance; Resilience; Talents/Skills   Sleep  Sleep:No data recorded   Physical Exam: Physical Exam Vitals and nursing note reviewed.  Constitutional:      Appearance: Normal appearance. He is normal weight.  Neurological:      General: No focal deficit present.     Mental Status: He is alert and oriented to person, place, and time.  Psychiatric:        Attention and Perception: Attention and perception normal.        Mood and Affect: Mood is anxious. Affect is labile.        Speech: Speech normal.        Behavior: Behavior normal. Behavior is cooperative.        Thought Content: Thought content is paranoid and delusional.        Cognition and Memory: Cognition and memory normal.        Judgment: Judgment normal.    Review of Systems  Constitutional: Negative.   HENT: Negative.    Eyes: Negative.   Respiratory: Negative.    Cardiovascular: Negative.   Gastrointestinal: Negative.   Genitourinary: Negative.   Musculoskeletal: Negative.   Skin: Negative.   Neurological: Negative.   Endo/Heme/Allergies: Negative.   Psychiatric/Behavioral:  The patient is nervous/anxious.    Blood pressure 118/80, pulse (!) 57, temperature 97.9 F (36.6 C), temperature source Oral, resp. rate 18, height 5\' 9"  (1.753 m), weight 58 kg, SpO2 100 %. Body mass index is 18.88 kg/m.   Treatment Plan Summary: Daily contact with patient to assess and evaluate symptoms and progress in treatment, Medication management, and Plan start Senokot.  [name], DO 11/24/2022, 12:39 PM

## 2022-11-24 NOTE — Progress Notes (Signed)
Pt at nurse's station. "I don't know why, but I'm not sleeping tonight." Pt given a bottle of water. Knows that he has already had additional sleep medication and that is all he can be given for tonight. Pt ambulated, with walker, back to his room. Will continue to monitor.

## 2022-11-24 NOTE — Plan of Care (Signed)
  Problem: Coping: Goal: Level of anxiety will decrease Outcome: Not Progressing   Problem: Nutrition: Goal: Adequate nutrition will be maintained Outcome: Progressing   Problem: Safety: Goal: Ability to remain free from injury will improve Outcome: Progressing

## 2022-11-25 DIAGNOSIS — F333 Major depressive disorder, recurrent, severe with psychotic symptoms: Secondary | ICD-10-CM | POA: Diagnosis not present

## 2022-11-25 LAB — HEMOGLOBIN A1C
Hgb A1c MFr Bld: 5.6 % (ref 4.8–5.6)
Mean Plasma Glucose: 114 mg/dL

## 2022-11-25 NOTE — Group Note (Signed)
LCSW Group Therapy Note  Group Date: 11/25/2022 Start Time: 1315 End Time: 1400   Type of Therapy and Topic:  Group Therapy - Healthy vs Unhealthy Coping Skills  Participation Level:  Did Not Attend   Description of Group The focus of this group was to determine what unhealthy coping techniques typically are used by group members and what healthy coping techniques would be helpful in coping with various problems. Patients were guided in becoming aware of the differences between healthy and unhealthy coping techniques. Patients were asked to identify 2-3 healthy coping skills they would like to learn to use more effectively.  Therapeutic Goals Patients learned that coping is what human beings do all day long to deal with various situations in their lives Patients defined and discussed healthy vs unhealthy coping techniques Patients identified their preferred coping techniques and identified whether these were healthy or unhealthy Patients determined 2-3 healthy coping skills they would like to become more familiar with and use more often. Patients provided support and ideas to each other   Summary of Patient Progress:   Patient arrived to group late, patient did not engage in group discussion.   Therapeutic Modalities Cognitive Behavioral Therapy Motivational Interviewing  Maryjane Hurter 11/25/2022  3:23 PM

## 2022-11-25 NOTE — Plan of Care (Signed)
  Problem: Nutrition: Goal: Adequate nutrition will be maintained Outcome: Progressing   Problem: Health Behavior/Discharge Planning: Goal: Ability to manage health-related needs will improve Outcome: Not Progressing   Problem: Coping: Goal: Level of anxiety will decrease Outcome: Not Progressing

## 2022-11-25 NOTE — Progress Notes (Signed)
Patient is A+O x 4. He denies SI/HI/AVH. He denies anxiety and depression. Medication adm whole without any issues. Appetite good.   Patient reported having a BM today as yesterday he reported constipation. Senna prescribed daily at bedtime.  Q15 minute unit checks in place.

## 2022-11-25 NOTE — Plan of Care (Signed)
  Problem: Clinical Measurements: Goal: Ability to maintain clinical measurements within normal limits will improve Outcome: Progressing   Problem: Nutrition: Goal: Adequate nutrition will be maintained Outcome: Progressing   Problem: Coping: Goal: Level of anxiety will decrease Outcome: Progressing  Patient compliant with medication endorsing anxiety of 6/10 denies SI/HI/A/VH and verbally contracted for safety. Patient had MOM for constipation no results yet. Patient encouraged to drink more fluids. Support and encouragement provided. No adverse medication effects noted.

## 2022-11-25 NOTE — Progress Notes (Signed)
[facility] MD Progress Note  11/25/2022 2:53 PM [name]  MRN:  [id number] Subjective: Follow-up with this 67 year old man in the hospital with a active diagnosis of major depression with psychotic features.  Patient says he has had a congested throat but admits that he was able to eat today and does not appear to be in any distress from it.  States his mood has been fine.  Denies suicidal or homicidal thought.  Requesting discharge.  No active behavior issues. Principal Problem: Severe recurrent major depressive disorder with psychotic features ([place]) Diagnosis: Principal Problem:   Severe recurrent major depressive disorder with psychotic features ([place])  Total Time spent with patient: 30 minutes  Past Psychiatric History: History of concern about recurrent somatic symptoms and possibility of anxiety and depression.  Past Medical History:  Past Medical History:  Diagnosis Date   Allergy    Anxiety    Chest pain    Depression    DJD (degenerative joint disease), cervical    postiton with pillow under knees, cant turn neck    Dysentery, amebic, acute 08/31/1979   GERD (gastroesophageal reflux disease)    H/O bronchitis    H/O malaria 08/30/1982   Hearing loss    bilateral    Hypertension    labile Blood pressure   Inguinal hernia    Insomnia    early morning awakening   MVP (mitral valve prolapse)    "no problems"   Perianal pain    Personal history of colonic polyps    Tinnitus    right ear    Past Surgical History:  Procedure Laterality Date   CARPAL TUNNEL RELEASE  10/06, 5/10   right wrist    CARPAL TUNNEL RELEASE  3/10   left wrist    cervical spine discectomy   09/2005   COLONOSCOPY WITH PROPOFOL N/A 10/21/2015   Procedure: COLONOSCOPY WITH PROPOFOL;  Surgeon: [name], MD;  Location: [facility];  Service: Endoscopy;  Laterality: N/A;   INGUINAL HERNIA REPAIR  08/16/2011   Procedure: HERNIA REPAIR INGUINAL ADULT;  Surgeon: [name], MD;  Location:  [facility];  Service: General;  Laterality: Left;   INGUINAL HERNIA REPAIR Right 05/03/2014   Procedure: OPEN RIGHT INGUINAL HERNIA REPAIR WITH MESH;  Surgeon: [name], MD;  Location: [facility];  Service: General;  Laterality: Right;   INGUINAL HERNIA REPAIR Left 08/20/2022   Procedure: HERNIA REPAIR INGUINAL ADULT;  Surgeon: [name], MD;  Location: [facility];  Service: General;  Laterality: Left;   INSERTION OF MESH Right 05/03/2014   Procedure: INSERTION OF MESH;  Surgeon: [name], MD;  Location: [facility];  Service: General;  Laterality: Right;   TONSILLECTOMY  1960   Family History:  Family History  Problem Relation Age of Onset   Cancer Mother        breat cancer,lung cancer   Intracerebral hemorrhage Father    Family Psychiatric  History: See previous Social History:  Social History   Substance and Sexual Activity  Alcohol Use Yes   Comment: 2 wine daily     Social History   Substance and Sexual Activity  Drug Use Not Currently   Types: Marijuana   Comment: weekend use    Social History   Socioeconomic History   Marital status: Married    Spouse name: Not on file   Number of children: 2   Years of education: Not on file   Highest education level: Professional school degree (e.g.,  MD, DDS, DVM, JD)  Occupational History   Occupation: psychologist  Tobacco Use   Smoking status: Never   Smokeless tobacco: Never  Vaping Use   Vaping Use: Never used  Substance and Sexual Activity   Alcohol use: Yes    Comment: 2 wine daily   Drug use: Not Currently    Types: Marijuana    Comment: weekend use   Sexual activity: Yes  Other Topics Concern   Not on file  Social History Narrative   Lives in single level home. He is a self employed Investment banker, operational.     Social Determinants of Health   Financial Resource Strain: Not on file  Food Insecurity: No Food Insecurity (11/21/2022)   Hunger Vital Sign    Worried About Running Out of Food in the  Last Year: Never true    Ran Out of Food in the Last Year: Never true  Transportation Needs: No Transportation Needs (11/21/2022)   PRAPARE - Hydrologist (Medical): No    Lack of Transportation (Non-Medical): No  Physical Activity: Not on file  Stress: Not on file  Social Connections: Not on file   Additional Social History:                         Sleep: Fair  Appetite:  Fair  Current Medications: Current Facility-Administered Medications  Medication Dose Route Frequency Provider Last Rate Last Admin   acetaminophen (TYLENOL) tablet 650 mg  650 mg Oral Q6H PRN [name], [name], DO       alum & mag hydroxide-simeth (MAALOX/MYLANTA) 200-200-20 MG/5ML suspension 30 mL  30 mL Oral Q4H PRN [name], [name], DO       ARIPiprazole (ABILIFY) tablet 5 mg  5 mg Oral Daily [name], DO   5 mg at 11/25/22 1012   busPIRone (BUSPAR) tablet 20 mg  20 mg Oral TID [name], NP   20 mg at 11/25/22 1011   diphenhydrAMINE (BENADRYL) capsule 50 mg  50 mg Oral TID PRN [name] L, DO       Or   diphenhydrAMINE (BENADRYL) injection 50 mg  50 mg Intramuscular TID PRN [name] L, DO       feeding supplement (ENSURE ENLIVE / ENSURE PLUS) liquid 237 mL  237 mL Oral TID BM [name], [name], DO   237 mL at 11/25/22 1030   haloperidol (HALDOL) tablet 5 mg  5 mg Oral TID PRN [name] L, DO   5 mg at 11/21/22 2142   Or   haloperidol lactate (HALDOL) injection 5 mg  5 mg Intramuscular TID PRN [name] L, DO       LORazepam (ATIVAN) tablet 2 mg  2 mg Oral BID [name], DO   2 mg at 11/25/22 1012   magnesium hydroxide (MILK OF MAGNESIA) suspension 30 mL  30 mL Oral Daily PRN [name] L, DO   30 mL at 11/24/22 1752   mirtazapine (REMERON) tablet 30 mg  30 mg Oral QHS Dixon, Rashaun M, NP   30 mg at 11/24/22 2105   multivitamin with minerals tablet 1 tablet  1 tablet Oral Daily [name] L, DO   1 tablet at 11/25/22 1011   senna-docusate (Senokot-S) tablet 2 tablet  2 tablet Oral QHS [name], DO   2 tablet at 11/24/22 1344   traZODone (DESYREL) tablet 50 mg  50 mg Oral  QHS [name], NP   50 mg at 11/24/22 2105   zolpidem (AMBIEN) tablet 5 mg  5 mg Oral QHS [name], DO   5 mg at 11/24/22 2105    Lab Results:  Results for orders placed or performed during the hospital encounter of 11/21/22 (from the past 48 hour(s))  Hemoglobin A1c     Status: None   Collection Time: 11/24/22  9:06 AM  Result Value Ref Range   Hgb A1c MFr Bld 5.6 4.8 - 5.6 %    Comment: (NOTE)         Prediabetes: 5.7 - 6.4         Diabetes: >6.4         Glycemic control for adults with diabetes: <7.0    Mean Plasma Glucose 114 mg/dL    Comment: (NOTE) Performed At: [facility] [place], [place] [id number] Rush Farmer MD [id number]   Lipid panel     Status: None   Collection Time: 11/24/22  9:06 AM  Result Value Ref Range   Cholesterol 175 0 - 200 mg/dL   Triglycerides 86 <150 mg/dL   HDL 68 >40 mg/dL   Total CHOL/HDL Ratio 2.6 RATIO   VLDL 17 0 - 40 mg/dL   LDL Cholesterol 90 0 - 99 mg/dL    Comment:        Total Cholesterol/HDL:CHD Risk Coronary Heart Disease Risk Table                     Men   Women  1/2 Average Risk   3.4   3.3  Average Risk       5.0   4.4  2 X Average Risk   9.6   7.1  3 X Average Risk  23.4   11.0        Use the calculated Patient Ratio above and the CHD Risk Table to determine the patient's CHD Risk.        ATP III CLASSIFICATION (LDL):  <100     mg/dL   Optimal  100-129  mg/dL   Near or Above                    Optimal  130-159  mg/dL   Borderline  160-189  mg/dL   High  >190     mg/dL   Very High Performed at [facility], [address] [place], [place] [address]     Blood Alcohol level:  Lab Results  Component Value Date   [facility] <10 11/17/2022   ETH <10  [id number]    Metabolic Disorder Labs: Lab Results  Component Value Date   HGBA1C 5.6 11/24/2022   MPG 114 11/24/2022   MPG 111.15 02/28/2019   No results found for: "PROLACTIN" Lab Results  Component Value Date   CHOL 175 11/24/2022   TRIG 86 11/24/2022   HDL 68 11/24/2022   CHOLHDL 2.6 11/24/2022   VLDL 17 11/24/2022   LDLCALC 90 11/24/2022   LDLCALC 91 02/28/2019    Physical Findings: AIMS:  , ,  ,  ,    CIWA:    COWS:     Musculoskeletal: Strength & Muscle Tone: within normal limits Gait & Station: normal Patient leans: N/A  Psychiatric Specialty Exam:  Presentation  General Appearance:  Appropriate for Environment; Casual  Eye Contact: Good  Speech: Clear and Coherent; Normal Rate  Speech Volume: Normal  Handedness: Right  Mood and Affect  Mood: Euthymic  Affect: Appropriate; Congruent   Thought Process  Thought Processes: Irrevelant  Descriptions of Associations:Intact  Orientation:Full (Time, Place and Person)  Thought Content:Paranoid Ideation; Perseveration; Tangential; Delusions  History of Schizophrenia/Schizoaffective disorder:Yes  Duration of Psychotic Symptoms:Greater than six months  Hallucinations:No data recorded Ideas of Reference:Delusions; Paranoia; Percusatory  Suicidal Thoughts:No data recorded Homicidal Thoughts:No data recorded  Sensorium  Memory: Immediate Fair; Recent Fair; Remote Fair  Judgment: Fair  Insight: Fair   Materials engineer: Fair  Attention Span: Good  Recall: Good  Fund of Knowledge: Good  Language: Fair   Psychomotor Activity  Psychomotor Activity:No data recorded  Assets  Assets: Social Support; Armed forces logistics/support/administrative officer; Desire for Improvement; Housing; Transportation; Chartered certified accountant; Leisure Time; Physical Health; Financial Resources/Insurance; Resilience; Talents/Skills   Sleep  Sleep:No data recorded   Physical Exam: Physical  Exam Vitals and nursing note reviewed.  Constitutional:      Appearance: Normal appearance.  HENT:     Head: Normocephalic and atraumatic.     Mouth/Throat:     Pharynx: Oropharynx is clear.  Eyes:     Pupils: Pupils are equal, round, and reactive to light.  Cardiovascular:     Rate and Rhythm: Normal rate and regular rhythm.  Pulmonary:     Effort: Pulmonary effort is normal.     Breath sounds: Normal breath sounds.  Abdominal:     General: Abdomen is flat.     Palpations: Abdomen is soft.  Musculoskeletal:        General: Normal range of motion.  Skin:    General: Skin is warm and dry.  Neurological:     General: No focal deficit present.     Mental Status: He is alert. Mental status is at baseline.  Psychiatric:        Attention and Perception: Attention normal.        Mood and Affect: Mood normal.        Speech: Speech normal.        Behavior: Behavior normal.        Thought Content: Thought content normal.        Cognition and Memory: Cognition normal.        Judgment: Judgment normal.    Review of Systems  Constitutional: Negative.   HENT: Negative.    Eyes: Negative.   Respiratory: Negative.    Cardiovascular: Negative.   Gastrointestinal: Negative.   Musculoskeletal: Negative.   Skin: Negative.   Neurological: Negative.   Psychiatric/Behavioral: Negative.     Blood pressure (!) 131/98, pulse 61, temperature 99 F (37.2 C), temperature source Oral, resp. rate 18, height 5\' 9"  (1.753 m), weight 58 kg, SpO2 99 %. Body mass index is 18.88 kg/m.   Treatment Plan Summary: Medication management and Plan patient appears to be stable at this point and there seems to be no indications of acute dangerousness to self or likelihood of immediate change.  He has a stable place to live outside the hospital.  Spoke with patient and treatment team and we are going to tentatively plan on discharge tomorrow.  Alethia Berthold, MD 11/25/2022, 2:53 PM

## 2022-11-25 NOTE — Group Note (Signed)
Date:  11/25/2022 Time:  10:42 AM  Group Topic/Focus:  Crisis Planning:   The purpose of this group is to help patients create a crisis plan for use upon discharge or in the future, as needed.    Participation Level:  Active  Participation Quality:  Appropriate  Affect:  Angry and Appropriate  Cognitive:  Alert and Appropriate  Insight: Appropriate  Engagement in Group:  Engaged  Modes of Intervention:  Discussion  Additional Comments:Mr madix was disrespectful and was asked to leave group.  Juanmiguel Defelice Danie Binder 11/25/2022, 10:42 AM

## 2022-11-25 NOTE — Group Note (Signed)
Recreation Therapy Group Note   Group Topic:Self-Esteem  Group Date: 11/25/2022 Start Time: 1400 End Time: 1450 Facilitators: Vilma Prader, LRT, CTRS Location:  Dayroom  Group Description: Patients and LRT discussed the importance of self-love and self-esteem. Pt completed a worksheet that helps them identify 24 different strengths and qualities about themselves. Pt encouraged to read aloud at least 3 off their sheet to the group. LRT and pts discussed how this can be applied to daily life post-discharge.  Pt's had the option to play "Positive Affirmation Bingo" afterwards, with journals or stress balls as bingo prizes, however pts declined.  Affect/Mood: Appropriate and Flat   Participation Level: Active and Engaged   Participation Quality: Independent   Behavior: Appropriate, Calm, and Cooperative   Speech/Thought Process: Coherent   Insight: Good   Judgement: Good   Modes of Intervention: Guided Discussion and Worksheet   Patient Response to Interventions:  Attentive, Engaged, Interested , and Receptive   Education Outcome:  Acknowledges education   Clinical Observations/Individualized Feedback: Brad [name]  was active in their participation of session activities and group discussion. Pt identified "I am good at talking to people and doing psycho- therapy. Compliments I have received are that I always put in effort and that I am well traveled. What I like most about myself is that I am thin and my hair." Pt interacted well with LRT and peers duration of group. Pt provided good and improved insight when prompted by LRT than in previous interactions.    Plan: Continue to engage patient in RT group sessions 2-3x/week.   Vilma Prader, LRT, CTRS 11/25/2022 3:34 PM

## 2022-11-26 DIAGNOSIS — F333 Major depressive disorder, recurrent, severe with psychotic symptoms: Secondary | ICD-10-CM | POA: Diagnosis not present

## 2022-11-26 NOTE — Progress Notes (Signed)
Patient is alert and oriented times 4. Mood and affect appropriate. Patient rates pain as 4/10  all over. He denies SI, HI, and AVH. Also denies feelings of anxiety and depression at this time. States he slept good last night. Morning meds given whole by mouth W/O difficulty. Ate breakfast in day room- appetite good. Patient remains on unit with Q15 minute checks in place.

## 2022-11-26 NOTE — Group Note (Signed)
Date:  11/26/2022 Time:  8:45 PM  Group Topic/Focus:  Self Esteem Action Plan:   The focus of this group is to help patients create a plan to continue to build self-esteem after discharge.    Participation Level:  Active  Participation Quality:  Attentive  Affect:  Irritable  Cognitive:  Oriented  Insight: Good  Engagement in Group:  Engaged  Modes of Intervention:  Discussion  Additional Comments:  Adult Unit Workbook p.[id number]  Neville Route 11/26/2022, 8:45 PM

## 2022-11-26 NOTE — Progress Notes (Signed)
[facility] MD Progress Note  11/26/2022 11:17 AM Brad [name]  MRN:  [id number] Subjective: Patient seen and chart reviewed.  This is a 67 year old man with a history of recurrent episodes symptoms currently diagnosed with major depression with psychotic features.  On interview today the patient was neatly groomed sitting in the day room and appropriate and engaging in conversation.  Made good eye contact.  Speech was normal in rate tone and volume.  Affect was reactive and appropriate.  Patient did not make any obviously delusional or psychotic statements and appeared to be organized in his thinking.  Denied hallucinations.  Denied suicidal or homicidal thoughts.  I had been given a copy of the healthcare power of attorney form earlier by a member of the treatment team and after reviewing it I showed it to the patient who confirmed that he had signed it.  Based on my interpretation of it it appears that it gives his son the right to have information about the patient's treatment and in that regard I asked the patient if he would be agreeable to my calling his son.  Patient agreed. Principal Problem: Severe recurrent major depressive disorder with psychotic features Diagnosis: Principal Problem:   Severe recurrent major depressive disorder with psychotic features ([place])  Total Time spent with patient: 30 minutes  Past Psychiatric History: Patient has a history of mental health symptoms with various diagnoses including psychotic symptoms and mood symptoms.  Past Medical History:  Past Medical History:  Diagnosis Date   Allergy    Anxiety    Chest pain    Depression    DJD (degenerative joint disease), cervical    postiton with pillow under knees, cant turn neck    Dysentery, amebic, acute 08/31/1979   GERD (gastroesophageal reflux disease)    H/O bronchitis    H/O malaria 08/30/1982   Hearing loss    bilateral    Hypertension    labile Blood pressure   Inguinal hernia    Insomnia    early  morning awakening   MVP (mitral valve prolapse)    "no problems"   Perianal pain    Personal history of colonic polyps    Tinnitus    right ear    Past Surgical History:  Procedure Laterality Date   CARPAL TUNNEL RELEASE  10/06, 5/10   right wrist    CARPAL TUNNEL RELEASE  3/10   left wrist    cervical spine discectomy   09/2005   COLONOSCOPY WITH PROPOFOL N/A 10/21/2015   Procedure: COLONOSCOPY WITH PROPOFOL;  Surgeon: [name], MD;  Location: [facility];  Service: Endoscopy;  Laterality: N/A;   INGUINAL HERNIA REPAIR  08/16/2011   Procedure: HERNIA REPAIR INGUINAL ADULT;  Surgeon: [name], MD;  Location: [facility];  Service: General;  Laterality: Left;   INGUINAL HERNIA REPAIR Right 05/03/2014   Procedure: OPEN RIGHT INGUINAL HERNIA REPAIR WITH MESH;  Surgeon: [name], MD;  Location: [facility];  Service: General;  Laterality: Right;   INGUINAL HERNIA REPAIR Left 08/20/2022   Procedure: HERNIA REPAIR INGUINAL ADULT;  Surgeon: [name], MD;  Location: [facility];  Service: General;  Laterality: Left;   INSERTION OF MESH Right 05/03/2014   Procedure: INSERTION OF MESH;  Surgeon: [name], MD;  Location: [facility];  Service: General;  Laterality: Right;   TONSILLECTOMY  1960   Family History:  Family History  Problem Relation Age of Onset   Cancer Mother  breat cancer,lung cancer   Intracerebral hemorrhage Father    Family Psychiatric  History: See previous Social History:  Social History   Substance and Sexual Activity  Alcohol Use Yes   Comment: 2 wine daily     Social History   Substance and Sexual Activity  Drug Use Not Currently   Types: Marijuana   Comment: weekend use    Social History   Socioeconomic History   Marital status: Married    Spouse name: Not on file   Number of children: 2   Years of education: Not on file   Highest education level: Professional school degree (e.g., MD, DDS, DVM, JD)  Occupational History    Occupation: psychologist  Tobacco Use   Smoking status: Never   Smokeless tobacco: Never  Vaping Use   Vaping Use: Never used  Substance and Sexual Activity   Alcohol use: Yes    Comment: 2 wine daily   Drug use: Not Currently    Types: Marijuana    Comment: weekend use   Sexual activity: Yes  Other Topics Concern   Not on file  Social History Narrative   Lives in single level home. He is a self employed Investment banker, operational.     Social Determinants of Health   Financial Resource Strain: Not on file  Food Insecurity: No Food Insecurity (11/21/2022)   Hunger Vital Sign    Worried About Running Out of Food in the Last Year: Never true    Ran Out of Food in the Last Year: Never true  Transportation Needs: No Transportation Needs (11/21/2022)   PRAPARE - Hydrologist (Medical): No    Lack of Transportation (Non-Medical): No  Physical Activity: Not on file  Stress: Not on file  Social Connections: Not on file   Additional Social History:                         Sleep: Fair  Appetite:  Fair  Current Medications: Current Facility-Administered Medications  Medication Dose Route Frequency Provider Last Rate Last Admin   acetaminophen (TYLENOL) tablet 650 mg  650 mg Oral Q6H PRN [name], [name], DO   650 mg at 11/25/22 2317   alum & mag hydroxide-simeth (MAALOX/MYLANTA) 200-200-20 MG/5ML suspension 30 mL  30 mL Oral Q4H PRN [name], [name], DO       ARIPiprazole (ABILIFY) tablet 5 mg  5 mg Oral Daily [name], DO   5 mg at 11/26/22 0819   busPIRone (BUSPAR) tablet 20 mg  20 mg Oral TID [name], NP   20 mg at 11/26/22 0819   diphenhydrAMINE (BENADRYL) capsule 50 mg  50 mg Oral TID PRN [name] L, DO       Or   diphenhydrAMINE (BENADRYL) injection 50 mg  50 mg Intramuscular TID PRN [name] L, DO       feeding supplement (ENSURE ENLIVE / ENSURE PLUS) liquid 237 mL  237 mL Oral TID BM  [name], [name], DO   237 mL at 11/26/22 0819   haloperidol (HALDOL) tablet 5 mg  5 mg Oral TID PRN [name] L, DO   5 mg at 11/21/22 2142   Or   haloperidol lactate (HALDOL) injection 5 mg  5 mg Intramuscular TID PRN [name] L, DO       LORazepam (ATIVAN) tablet 2 mg  2 mg Oral BID [name], DO   2  mg at 11/26/22 0819   magnesium hydroxide (MILK OF MAGNESIA) suspension 30 mL  30 mL Oral Daily PRN [name] L, DO   30 mL at 11/26/22 1009   mirtazapine (REMERON) tablet 30 mg  30 mg Oral QHS Dixon, Rashaun M, NP   30 mg at 11/25/22 2116   multivitamin with minerals tablet 1 tablet  1 tablet Oral Daily [name] L, DO   1 tablet at 11/26/22 [id number]   senna-docusate (Senokot-S) tablet 2 tablet  2 tablet Oral QHS [name], DO   2 tablet at 11/25/22 2114   traZODone (DESYREL) tablet 50 mg  50 mg Oral QHS Anette Riedel M, NP   50 mg at 11/25/22 2113   zolpidem (AMBIEN) tablet 5 mg  5 mg Oral QHS [name], DO   5 mg at 11/25/22 2114    Lab Results: No results found for this or any previous visit (from the past 48 hour(s)).  Blood Alcohol level:  Lab Results  Component Value Date   ETH <10 11/17/2022   ETH <10 123XX123    Metabolic Disorder Labs: Lab Results  Component Value Date   HGBA1C 5.6 11/24/2022   MPG 114 11/24/2022   MPG 111.15 02/28/2019   No results found for: "PROLACTIN" Lab Results  Component Value Date   CHOL 175 11/24/2022   TRIG 86 11/24/2022   HDL 68 11/24/2022   CHOLHDL 2.6 11/24/2022   VLDL 17 11/24/2022   LDLCALC 90 11/24/2022   LDLCALC 91 02/28/2019    Physical Findings: AIMS:  , ,  ,  ,    CIWA:    COWS:     Musculoskeletal: Strength & Muscle Tone: within normal limits Gait & Station: normal Patient leans: N/A  Psychiatric Specialty Exam:  Presentation  General Appearance:  Appropriate for Environment; Casual  Eye Contact: Good  Speech: Clear and Coherent;  Normal Rate  Speech Volume: Normal  Handedness: Right   Mood and Affect  Mood: Euthymic  Affect: Appropriate; Congruent   Thought Process  Thought Processes: Irrevelant  Descriptions of Associations:Intact  Orientation:Full (Time, Place and Person)  Thought Content:Paranoid Ideation; Perseveration; Tangential; Delusions  History of Schizophrenia/Schizoaffective disorder:Yes  Duration of Psychotic Symptoms:Greater than six months  Hallucinations:No data recorded Ideas of Reference:Delusions; Paranoia; Percusatory  Suicidal Thoughts:No data recorded Homicidal Thoughts:No data recorded  Sensorium  Memory: Immediate Fair; Recent Fair; Remote Fair  Judgment: Fair  Insight: Fair   Materials engineer: Fair  Attention Span: Good  Recall: Good  Fund of Knowledge: Good  Language: Fair   Psychomotor Activity  Psychomotor Activity:No data recorded  Assets  Assets: Social Support; Armed forces logistics/support/administrative officer; Desire for Improvement; Housing; Transportation; Chartered certified accountant; Leisure Time; Physical Health; Financial Resources/Insurance; Resilience; Talents/Skills   Sleep  Sleep:No data recorded   Physical Exam: Physical Exam Vitals and nursing note reviewed.  Constitutional:      Appearance: Normal appearance.  HENT:     Head: Normocephalic and atraumatic.     Mouth/Throat:     Pharynx: Oropharynx is clear.  Eyes:     Pupils: Pupils are equal, round, and reactive to light.  Cardiovascular:     Rate and Rhythm: Normal rate and regular rhythm.  Pulmonary:     Effort: Pulmonary effort is normal.     Breath sounds: Normal breath sounds.  Abdominal:     General: Abdomen is flat.     Palpations: Abdomen is soft.  Musculoskeletal:        General:  Normal range of motion.  Skin:    General: Skin is warm and dry.  Neurological:     General: No focal deficit present.     Mental Status: He is alert. Mental status is at  baseline.  Psychiatric:        Attention and Perception: Attention normal.        Mood and Affect: Mood normal.        Speech: Speech normal.        Behavior: Behavior normal.        Thought Content: Thought content normal.        Cognition and Memory: Cognition normal.        Judgment: Judgment normal.    Review of Systems  Constitutional: Negative.   HENT: Negative.    Eyes: Negative.   Respiratory: Negative.    Cardiovascular: Negative.   Gastrointestinal: Negative.   Musculoskeletal: Negative.   Skin: Negative.   Neurological: Negative.   Psychiatric/Behavioral: Negative.     Blood pressure 139/88, pulse (!) 52, temperature 97.8 F (36.6 C), resp. rate 18, height 5\' 9"  (1.753 m), weight 58 kg, SpO2 98 %. Body mass index is 18.88 kg/m.   Treatment Plan Summary: Medication management and Plan patient appears to be tolerating medication without any evidence of obvious side effects.  He has not engaged in any dangerous behavior.  I had been considering discharging the patient until I spoke with his son.  Son was quite forceful and stressing how concerned he was about his father's mental health and his belief that his father was making bad decisions and was not mentally well.  Very clear that the son did not believe that the patient should be discharged right now.  Based on this I will defer discharge as it is my understanding that the patient remains under involuntary commitment.  No evidence that I can see to justify changing any of his medications at this point.  Alethia Berthold, MD 11/26/2022, 11:17 AM

## 2022-11-26 NOTE — [facility] Group Notes (Signed)
[facility] Group Notes:  (Nursing/MHT/Case Management/Adjunct)  Date:  11/26/2022  Time:  11:02 AM  Type of Therapy:  Movement Therapy  Participation Level:  Did Not Attend  Participation Quality:  Appropriate  Affect:  Appropriate  Cognitive:  Appropriate  Insight:  Appropriate  Engagement in Group:  Engaged  Modes of Intervention:  Activity  Summary of Progress/Problems:  Brad [name] 11/26/2022, 11:02 AM

## 2022-11-26 NOTE — Group Note (Signed)
Recreation Therapy Group Note   Group Topic:Leisure Education  Group Date: 11/26/2022 Start Time: 1300 End Time: 1345 Facilitators: Vilma Prader, LRT, CTRS Location: Courtyard  Group Description: Leisure. Patients were given the opportunity to exercise, play cards, listen to music, or go outside to the courtyard. Collectively, pts chose to go outside to the courtyard to get fresh air and sunlight. Pt identified and conversated about things they enjoy doing in their free time and how they can continue to do that outside of the hospital.  Affect/Mood: Appropriate and Blunted   Participation Level: Active   Participation Quality: Independent   Behavior: Alert, Appropriate, and Cooperative   Speech/Thought Process: Coherent   Insight: Moderate   Judgement: Moderate   Modes of Intervention: Guided Discussion   Patient Response to Interventions:  Receptive   Education Outcome:  Acknowledges education   Clinical Observations/Individualized Feedback: Oba was active in their participation of session activities and group discussion. Pt identified "going out to eat". LRT prompted further and pt shared that his favorite restaurants are [employer] and [address] [employer]. Pt minimally interacted with peers and interacted with LRT when prompted.    Plan: Continue to engage patient in RT group sessions 2-3x/week.   Vilma Prader, LRT, CTRS 11/26/2022 1:56 PM

## 2022-11-26 NOTE — Progress Notes (Signed)
Patient ID: Brad [name], male   DOB: 02-Jun-1956, 67 y.o.   MRN: [id number] 3 follow-up on earlier note.  Patient up and about on the unit.  No aggression no behavior problems.  He would like to be discharged and appears frustrated at still being in the hospital.  Reviewed with him his son's concerns.  Patient does not feel that his son's concerns are particularly valid.  I have reviewed the legal status of his hospitalization.  It appears that he was allowed to sign the voluntary admission form yesterday which appears to have made him now voluntary rather than involuntary patient.  However, I do not see that he has signed the request for discharge so there is still no "tiking clock" forcing a discharge decision.  Continue current medicine.

## 2022-11-26 NOTE — Group Note (Signed)
Date:  11/26/2022 Time:  1:06 AM  Group Topic/Focus:  Coping With Mental Health Crisis:   The purpose of this group is to help patients identify strategies for coping with mental health crisis.  Group discusses possible causes of crisis and ways to manage them effectively.    Participation Level:  Active  Participation Quality:  Redirectable  Affect:  Blunted  Cognitive:  Lacking  Insight: Lacking  Engagement in Group:  Defensive  Modes of Intervention:  Discussion  Additional Comments:    Wakhaila h Aqsa Sensabaugh 11/26/2022, 1:06 AM

## 2022-11-26 NOTE — Plan of Care (Signed)
  Problem: Education: Goal: Knowledge of General Education information will improve Description: Including pain rating scale, medication(s)/side effects and non-pharmacologic comfort measures Outcome: Not Progressing   Problem: Health Behavior/Discharge Planning: Goal: Ability to manage health-related needs will improve Outcome: Not Progressing   Problem: Clinical Measurements: Goal: Ability to maintain clinical measurements within normal limits will improve Outcome: Progressing Goal: Will remain free from infection Outcome: Progressing Goal: Diagnostic test results will improve Outcome: Progressing Goal: Respiratory complications will improve Outcome: Progressing Goal: Cardiovascular complication will be avoided Outcome: Progressing   Problem: Activity: Goal: Risk for activity intolerance will decrease Outcome: Progressing   Problem: Nutrition: Goal: Adequate nutrition will be maintained Outcome: Progressing   Problem: Coping: Goal: Level of anxiety will decrease Outcome: Not Progressing   Problem: Elimination: Goal: Will not experience complications related to bowel motility Outcome: Progressing Goal: Will not experience complications related to urinary retention Outcome: Progressing   Problem: Pain Managment: Goal: General experience of comfort will improve Outcome: Not Progressing   Problem: Safety: Goal: Ability to remain free from injury will improve Outcome: Progressing   Problem: Skin Integrity: Goal: Risk for impaired skin integrity will decrease Outcome: Progressing   Problem: Education: Goal: Ability to state activities that reduce stress will improve Outcome: Not Progressing   Problem: Coping: Goal: Ability to identify and develop effective coping behavior will improve Outcome: Not Progressing   Problem: Self-Concept: Goal: Ability to identify factors that promote anxiety will improve Outcome: Not Progressing Goal: Level of anxiety will  decrease Outcome: Not Progressing Goal: Ability to modify response to factors that promote anxiety will improve Outcome: Not Progressing   Problem: Activity: Goal: Will verbalize the importance of balancing activity with adequate rest periods Outcome: Progressing   Problem: Education: Goal: Will be free of psychotic symptoms Outcome: Not Progressing Goal: Knowledge of the prescribed therapeutic regimen will improve Outcome: Not Progressing   Problem: Coping: Goal: Coping ability will improve Outcome: Not Progressing Goal: Will verbalize feelings Outcome: Progressing   Problem: Self-Concept: Goal: Will verbalize positive feelings about self Outcome: Not Progressing   Problem: Self-Care: Goal: Ability to participate in self-care as condition permits will improve Outcome: Not Progressing   Problem: Safety: Goal: Ability to redirect hostility and anger into socially appropriate behaviors will improve Outcome: Not Progressing Goal: Ability to remain free from injury will improve Outcome: Progressing   Problem: Role Relationship: Goal: Ability to communicate needs accurately will improve Outcome: Progressing Goal: Ability to interact with others will improve Outcome: Not Progressing

## 2022-11-26 NOTE — Progress Notes (Signed)
   11/26/22 0720  Psych Admission Type (Psych Patients Only)  Admission Status Involuntary  Psychosocial Assessment  Patient Complaints Irritability  Eye Contact Fair  Facial Expression Worried  Affect Anxious  Speech Logical/coherent  Interaction Assertive  Motor Activity Unsteady  Appearance/Hygiene In scrubs  Behavior Characteristics Impulsive  Mood Preoccupied  Thought Process  Coherency Tangential  Content Hypochondria  Delusions Somatic  Perception WDL  Hallucination None reported or observed  Judgment Impaired  Confusion Mild  Danger to Self  Current suicidal ideation? Denies  Danger to Others  Danger to Others None reported or observed

## 2022-11-27 DIAGNOSIS — F333 Major depressive disorder, recurrent, severe with psychotic symptoms: Secondary | ICD-10-CM | POA: Diagnosis not present

## 2022-11-27 MED ORDER — OXYMETAZOLINE HCL 0.05 % NA SOLN
1.0000 | Freq: Two times a day (BID) | NASAL | Status: DC
  Administered 2022-11-27 – 2022-11-29 (×5): 1 via NASAL
  Filled 2022-11-27: qty 15

## 2022-11-27 NOTE — Group Note (Signed)
Date:  11/27/2022 Time:  11:23 PM  Group Topic/Focus:  Goals Group:   The focus of this group is to help patients establish daily goals to achieve during treatment and discuss how the patient can incorporate goal setting into their daily lives to aide in recovery.    Participation Level:  Active  Participation Quality:  Appropriate  Affect:  Appropriate  Cognitive:  Alert  Insight: Appropriate  Engagement in Group:  Supportive  Modes of Intervention:  Support  Additional Comments:    Bradd Canary 11/27/2022, 11:23 PM

## 2022-11-27 NOTE — Progress Notes (Signed)
Patient is irritated this morning and reports no one will help him.  Patient states he is unable to breath and could not sleep last night.  VS stable.  Patient is not short of breath, able to speak in full sentences.  Ambulating without difficulty and eating breakfast in the dayroom. Patient requests Sudafed and Afrin to help him breath.  RN will make MD aware. Denies SI/HI and AVH.  Denies any feelings of anxiety or depression. 15 min checks in place for patient safety.    Patient compliant with scheduled medications, but states he has not been given his BP medication. Patient admits he has not had this conversation with the doctor since his arrival.  Patient's interaction with staff is arrogant and condescending.

## 2022-11-27 NOTE — Progress Notes (Signed)
[facility] MD Progress Note  11/27/2022 1:36 PM Brad [name]  MRN:  [id number] Subjective: Patient was complaining of nasal and throat congestion today.  Apparently told the nurse that he felt like he could not breathe.  Nursing did not notice any breathing distress.  He asked me somewhat more calmly if he could have some nasal decongestant.  Otherwise no specific complaints no mental health complaints.  Denies suicidal thoughts and no evident delusions. Principal Problem: Severe recurrent major depressive disorder with psychotic features Diagnosis: Principal Problem:   Severe recurrent major depressive disorder with psychotic features ([place])  Total Time spent with patient: 30 minutes  Past Psychiatric History: Past history of recurrent episodes of mental health problems of varying diagnoses currently diagnosed as major depression with psychotic features.  Past Medical History:  Past Medical History:  Diagnosis Date   Allergy    Anxiety    Chest pain    Depression    DJD (degenerative joint disease), cervical    postiton with pillow under knees, cant turn neck    Dysentery, amebic, acute 08/31/1979   GERD (gastroesophageal reflux disease)    H/O bronchitis    H/O malaria 08/30/1982   Hearing loss    bilateral    Hypertension    labile Blood pressure   Inguinal hernia    Insomnia    early morning awakening   MVP (mitral valve prolapse)    "no problems"   Perianal pain    Personal history of colonic polyps    Tinnitus    right ear    Past Surgical History:  Procedure Laterality Date   CARPAL TUNNEL RELEASE  10/06, 5/10   right wrist    CARPAL TUNNEL RELEASE  3/10   left wrist    cervical spine discectomy   09/2005   COLONOSCOPY WITH PROPOFOL N/A 10/21/2015   Procedure: COLONOSCOPY WITH PROPOFOL;  Surgeon: [name], MD;  Location: [facility];  Service: Endoscopy;  Laterality: N/A;   INGUINAL HERNIA REPAIR  08/16/2011   Procedure: HERNIA REPAIR INGUINAL ADULT;  Surgeon:  [name], MD;  Location: [facility];  Service: General;  Laterality: Left;   INGUINAL HERNIA REPAIR Right 05/03/2014   Procedure: OPEN RIGHT INGUINAL HERNIA REPAIR WITH MESH;  Surgeon: [name], MD;  Location: [facility];  Service: General;  Laterality: Right;   INGUINAL HERNIA REPAIR Left 08/20/2022   Procedure: HERNIA REPAIR INGUINAL ADULT;  Surgeon: [name], MD;  Location: [facility];  Service: General;  Laterality: Left;   INSERTION OF MESH Right 05/03/2014   Procedure: INSERTION OF MESH;  Surgeon: [name], MD;  Location: [facility];  Service: General;  Laterality: Right;   TONSILLECTOMY  1960   Family History:  Family History  Problem Relation Age of Onset   Cancer Mother        breat cancer,lung cancer   Intracerebral hemorrhage Father    Family Psychiatric  History: See previous Social History:  Social History   Substance and Sexual Activity  Alcohol Use Yes   Comment: 2 wine daily     Social History   Substance and Sexual Activity  Drug Use Not Currently   Types: Marijuana   Comment: weekend use    Social History   Socioeconomic History   Marital status: Married    Spouse name: Not on file   Number of children: 2   Years of education: Not on file   Highest education level: Professional school degree (e.g., MD,  DDS, DVM, JD)  Occupational History   Occupation: psychologist  Tobacco Use   Smoking status: Never   Smokeless tobacco: Never  Vaping Use   Vaping Use: Never used  Substance and Sexual Activity   Alcohol use: Yes    Comment: 2 wine daily   Drug use: Not Currently    Types: Marijuana    Comment: weekend use   Sexual activity: Yes  Other Topics Concern   Not on file  Social History Narrative   Lives in single level home. He is a self employed Investment banker, operational.     Social Determinants of Health   Financial Resource Strain: Not on file  Food Insecurity: No Food Insecurity (11/21/2022)   Hunger Vital Sign    Worried  About Running Out of Food in the Last Year: Never true    Ran Out of Food in the Last Year: Never true  Transportation Needs: No Transportation Needs (11/21/2022)   PRAPARE - Hydrologist (Medical): No    Lack of Transportation (Non-Medical): No  Physical Activity: Not on file  Stress: Not on file  Social Connections: Not on file   Additional Social History:                         Sleep: Fair  Appetite:  Fair  Current Medications: Current Facility-Administered Medications  Medication Dose Route Frequency Provider Last Rate Last Admin   acetaminophen (TYLENOL) tablet 650 mg  650 mg Oral Q6H PRN [name], [name], DO   650 mg at 11/25/22 2317   alum & mag hydroxide-simeth (MAALOX/MYLANTA) 200-200-20 MG/5ML suspension 30 mL  30 mL Oral Q4H PRN [name] L, DO       ARIPiprazole (ABILIFY) tablet 5 mg  5 mg Oral Daily [name], DO   5 mg at 11/27/22 [id number]   busPIRone (BUSPAR) tablet 20 mg  20 mg Oral TID [name], NP   20 mg at 11/27/22 [id number]   diphenhydrAMINE (BENADRYL) capsule 50 mg  50 mg Oral TID PRN [name] L, DO       Or   diphenhydrAMINE (BENADRYL) injection 50 mg  50 mg Intramuscular TID PRN [name] L, DO       feeding supplement (ENSURE ENLIVE / ENSURE PLUS) liquid 237 mL  237 mL Oral TID BM [name], [name], DO   237 mL at 11/27/22 1331   haloperidol (HALDOL) tablet 5 mg  5 mg Oral TID PRN [name] L, DO   5 mg at 11/21/22 2142   Or   haloperidol lactate (HALDOL) injection 5 mg  5 mg Intramuscular TID PRN [name] L, DO       LORazepam (ATIVAN) tablet 2 mg  2 mg Oral BID [name], DO   2 mg at 11/27/22 0902   magnesium hydroxide (MILK OF MAGNESIA) suspension 30 mL  30 mL Oral Daily PRN [name] L, DO   30 mL at 11/26/22 1009   mirtazapine (REMERON) tablet 30 mg  30 mg Oral QHS Dixon, Rashaun M, NP   30 mg at 11/26/22 2129   multivitamin with  minerals tablet 1 tablet  1 tablet Oral Daily [name] L, DO   1 tablet at 11/27/22 [id number]   oxymetazoline (AFRIN) 0.05 % nasal spray 1 spray  1 spray Each Nare BID Jovanka Westgate, Madie Reno, MD       senna-docusate (Senokot-S) tablet 2 tablet  2 tablet Oral QHS [name], DO   2 tablet at 11/26/22 2127   traZODone (DESYREL) tablet 50 mg  50 mg Oral QHS [name], NP   50 mg at 11/26/22 2127   zolpidem (AMBIEN) tablet 5 mg  5 mg Oral QHS [name], DO   5 mg at 11/26/22 2127    Lab Results: No results found for this or any previous visit (from the past 48 hour(s)).  Blood Alcohol level:  Lab Results  Component Value Date   ETH <10 11/17/2022   ETH <10 [id number]    Metabolic Disorder Labs: Lab Results  Component Value Date   HGBA1C 5.6 11/24/2022   MPG 114 11/24/2022   MPG 111.15 02/28/2019   No results found for: "PROLACTIN" Lab Results  Component Value Date   CHOL 175 11/24/2022   TRIG 86 11/24/2022   HDL 68 11/24/2022   CHOLHDL 2.6 11/24/2022   VLDL 17 11/24/2022   LDLCALC 90 11/24/2022   LDLCALC 91 02/28/2019    Physical Findings: AIMS:  , ,  ,  ,    CIWA:    COWS:     Musculoskeletal: Strength & Muscle Tone: within normal limits Gait & Station: normal Patient leans: N/A  Psychiatric Specialty Exam:  Presentation  General Appearance:  Appropriate for Environment; Casual  Eye Contact: Good  Speech: Clear and Coherent; Normal Rate  Speech Volume: Normal  Handedness: Right   Mood and Affect  Mood: Euthymic  Affect: Appropriate; Congruent   Thought Process  Thought Processes: Irrevelant  Descriptions of Associations:Intact  Orientation:Full (Time, Place and Person)  Thought Content:Paranoid Ideation; Perseveration; Tangential; Delusions  History of Schizophrenia/Schizoaffective disorder:Yes  Duration of Psychotic Symptoms:Greater than six months  Hallucinations:No data recorded Ideas of  Reference:Delusions; Paranoia; Percusatory  Suicidal Thoughts:No data recorded Homicidal Thoughts:No data recorded  Sensorium  Memory: Immediate Fair; Recent Fair; Remote Fair  Judgment: Fair  Insight: Fair   Materials engineer: Fair  Attention Span: Good  Recall: Good  Fund of Knowledge: Good  Language: Fair   Psychomotor Activity  Psychomotor Activity:No data recorded  Assets  Assets: Social Support; Armed forces logistics/support/administrative officer; Desire for Improvement; Housing; Transportation; Chartered certified accountant; Leisure Time; Physical Health; Financial Resources/Insurance; Resilience; Talents/Skills   Sleep  Sleep:No data recorded   Physical Exam: Physical Exam Vitals and nursing note reviewed.  Constitutional:      Appearance: Normal appearance.  HENT:     Head: Normocephalic and atraumatic.     Mouth/Throat:     Pharynx: Oropharynx is clear.  Eyes:     Pupils: Pupils are equal, round, and reactive to light.  Cardiovascular:     Rate and Rhythm: Normal rate and regular rhythm.  Pulmonary:     Effort: Pulmonary effort is normal.     Breath sounds: Normal breath sounds.  Abdominal:     General: Abdomen is flat.     Palpations: Abdomen is soft.  Musculoskeletal:        General: Normal range of motion.  Skin:    General: Skin is warm and dry.  Neurological:     General: No focal deficit present.     Mental Status: He is alert. Mental status is at baseline.  Psychiatric:        Attention and Perception: Attention normal.        Mood and Affect: Mood normal.        Speech: Speech normal.        Behavior: Behavior normal.  Thought Content: Thought content normal.        Cognition and Memory: Cognition normal.        Judgment: Judgment normal.    Review of Systems  Constitutional: Negative.   HENT: Negative.    Eyes: Negative.   Respiratory: Negative.    Cardiovascular: Negative.   Gastrointestinal: Negative.   Musculoskeletal:  Negative.   Skin: Negative.   Neurological: Negative.   Psychiatric/Behavioral: Negative.     Blood pressure (!) 114/93, pulse 66, temperature 99.3 F (37.4 C), temperature source Oral, resp. rate 18, height 5\' 9"  (1.753 m), weight 58 kg, SpO2 99 %. Body mass index is 18.88 kg/m.   Treatment Plan Summary: Medication management and Plan no evidence right now of the need to change any psychiatric medicine.  Does not appear to be psychotic does not appear to be acutely dangerous.  Patient is disappointed we did not discharge him yesterday.  He did sign a voluntary form making his status now a voluntary patient in the hospital.  I have updated that on the orders.  I pointed out to him that he does have the right to sign the request for discharge form which puts a 72-hour timer on his discharge plan and then he could discuss it after the weekend.  Alethia Berthold, MD 11/27/2022, 1:36 PM

## 2022-11-27 NOTE — Group Note (Signed)
Date:  11/27/2022 Time:  4:06 PM  Group Topic/Focus:  Building Self Esteem:   The Focus of this group is helping patients become aware of the effects of self-esteem on their lives, the things they and others do that enhance or undermine their self-esteem, seeing the relationship between their level of self-esteem and the choices they make and learning ways to enhance self-esteem. Making Healthy Choices:   The focus of this group is to help patients identify negative/unhealthy choices they were using prior to admission and identify positive/healthier coping strategies to replace them upon discharge. Overcoming Stress:   The focus of this group is to define stress and help patients assess their triggers. Rediscovering Joy:   The focus of this group is to explore various ways to relieve stress in a positive manner. Self Care:   The focus of this group is to help patients understand the importance of self-care in order to improve or restore emotional, physical, spiritual, interpersonal, and financial health. Wellness Toolbox:   The focus of this group is to discuss various aspects of wellness, balancing those aspects and exploring ways to increase the ability to experience wellness.  Patients will create a wellness toolbox for use upon discharge.    Participation Level:  Active  Participation Quality:  Appropriate, Attentive, and Sharing  Affect:  Appropriate  Cognitive:  Alert, Appropriate, and Oriented  Insight: Appropriate and Good  Engagement in Group:  Engaged  Modes of Intervention:  Activity, Exploration, and Socialization  Additional Comments:    Avis Epley 11/27/2022, 4:06 PM

## 2022-11-27 NOTE — Progress Notes (Signed)
Patient has signed a 72 hour request for discharge.  Dr. Weber Cooks aware.

## 2022-11-27 NOTE — Plan of Care (Signed)
  Problem: Nutrition: Goal: Adequate nutrition will be maintained Outcome: Progressing   Problem: Skin Integrity: Goal: Risk for impaired skin integrity will decrease Outcome: Progressing   Problem: Coping: Goal: Coping ability will improve Outcome: Progressing   Problem: Coping: Goal: Level of anxiety will decrease Outcome: Not Progressing

## 2022-11-27 NOTE — BH IP Treatment Plan (Signed)
Interdisciplinary Treatment and Diagnostic Plan Update  11/27/2022 Time of Session: 0830 Brad [name] [facility] MRN: [id number]  Principal Diagnosis: Severe recurrent major depressive disorder with psychotic features  Secondary Diagnoses: Principal Problem:   Severe recurrent major depressive disorder with psychotic features ([place])   Current Medications:  Current Facility-Administered Medications  Medication Dose Route Frequency Provider Last Rate Last Admin   acetaminophen (TYLENOL) tablet 650 mg  650 mg Oral Q6H PRN [name], [name], DO   650 mg at 11/25/22 2317   alum & mag hydroxide-simeth (MAALOX/MYLANTA) 200-200-20 MG/5ML suspension 30 mL  30 mL Oral Q4H PRN [name] L, DO       ARIPiprazole (ABILIFY) tablet 5 mg  5 mg Oral Daily [name], DO   5 mg at 11/27/22 [id number]   busPIRone (BUSPAR) tablet 20 mg  20 mg Oral TID [name], NP   20 mg at 11/27/22 [id number]   diphenhydrAMINE (BENADRYL) capsule 50 mg  50 mg Oral TID PRN [name] L, DO       Or   diphenhydrAMINE (BENADRYL) injection 50 mg  50 mg Intramuscular TID PRN [name] L, DO       feeding supplement (ENSURE ENLIVE / ENSURE PLUS) liquid 237 mL  237 mL Oral TID BM [name], [name], DO   237 mL at 11/27/22 [id number]   haloperidol (HALDOL) tablet 5 mg  5 mg Oral TID PRN [name] L, DO   5 mg at 11/21/22 2142   Or   haloperidol lactate (HALDOL) injection 5 mg  5 mg Intramuscular TID PRN [name] L, DO       LORazepam (ATIVAN) tablet 2 mg  2 mg Oral BID [name], DO   2 mg at 11/27/22 0902   magnesium hydroxide (MILK OF MAGNESIA) suspension 30 mL  30 mL Oral Daily PRN [name] L, DO   30 mL at 11/26/22 1009   mirtazapine (REMERON) tablet 30 mg  30 mg Oral QHS Dixon, Rashaun M, NP   30 mg at 11/26/22 2129   multivitamin with minerals tablet 1 tablet  1 tablet Oral Daily [name] L, DO   1 tablet at 11/27/22 [id number]   senna-docusate (Senokot-S) tablet 2  tablet  2 tablet Oral QHS [name], DO   2 tablet at 11/26/22 2127   traZODone (DESYREL) tablet 50 mg  50 mg Oral QHS Anette Riedel M, NP   50 mg at 11/26/22 2127   zolpidem (AMBIEN) tablet 5 mg  5 mg Oral QHS [name], DO   5 mg at 11/26/22 2127   PTA Medications: Medications Prior to Admission  Medication Sig Dispense Refill Last Dose   Alpha-Lipoic Acid 600 MG CAPS Take 600 mg by mouth daily.      amLODipine (NORVASC) 5 MG tablet Take 2 tablets (10 mg total) by mouth daily. 90 tablet 1    atorvastatin (LIPITOR) 10 MG tablet Take 10 mg by mouth 3 (three) times a week. MWF      busPIRone (BUSPAR) 10 MG tablet Take 20 mg by mouth 3 (three) times daily.       Cholecalciferol (VITAMIN D3) 125 MCG (5000 UT) TABS Take 5,000 Units by mouth daily.      Coenzyme Q10 50 MG CAPS Take 50 mg by mouth daily.      cyclobenzaprine (FLEXERIL) 10 MG tablet Take 1 tablet (10 mg total) by mouth 2 (two) times daily as needed for muscle spasms. [place]  tablet 0    docusate sodium (COLACE) 100 MG capsule Take 1 capsule (100 mg total) by mouth every 12 (twelve) hours. 60 capsule 0    hydrOXYzine (ATARAX) 25 MG tablet Take 1 tablet (25 mg total) by mouth every 8 (eight) hours as needed. 12 tablet 0    LORazepam (ATIVAN) 1 MG tablet Take 1 mg by mouth 2 (two) times daily.      losartan (COZAAR) 100 MG tablet Take 100 mg by mouth daily.      meloxicam (MOBIC) 7.5 MG tablet Take 7.5 mg by mouth daily.      mirtazapine (REMERON) 30 MG tablet Take 30 mg by mouth at bedtime.      Multiple Vitamin (MULTIVITAMIN) tablet Take 1 tablet by mouth daily.      Omega-3 1000 MG CAPS Take 1,000 mg by mouth daily.      polyethylene glycol (MIRALAX) 17 g packet Take 17 g by mouth 2 (two) times daily. 14 each 0    pregabalin (LYRICA) 75 MG capsule Take 75 mg by mouth 2 (two) times daily.      senna-docusate (SENOKOT-S) 8.6-50 MG tablet Take 1 tablet by mouth at bedtime as needed for mild constipation. 30  tablet 0    traZODone (DESYREL) 50 MG tablet Take 50-100 mg by mouth at bedtime as needed for sleep.      zaleplon (SONATA) 10 MG capsule Take 10-20 mg by mouth at bedtime as needed for sleep.      zinc gluconate 50 MG tablet Take 50 mg by mouth daily.       Patient Stressors: Health problems    Patient Strengths: Capable of independent living  Marketing executive fund of knowledge   Treatment Modalities: Medication Management, Group therapy, Case management,  1 to 1 session with clinician, Psychoeducation, Recreational therapy.   Physician Treatment Plan for Primary Diagnosis: Severe recurrent major depressive disorder with psychotic features Long Term Goal(s): Improvement in symptoms so as ready for discharge   Short Term Goals: Ability to identify changes in lifestyle to reduce recurrence of condition will improve Ability to verbalize feelings will improve Ability to disclose and discuss suicidal ideas Ability to demonstrate self-control will improve Ability to identify and develop effective coping behaviors will improve Ability to maintain clinical measurements within normal limits will improve Compliance with prescribed medications will improve  Medication Management: Evaluate patient's response, side effects, and tolerance of medication regimen.  Therapeutic Interventions: 1 to 1 sessions, Unit Group sessions and Medication administration.  Evaluation of Outcomes: Progressing  Physician Treatment Plan for Secondary Diagnosis: Principal Problem:   Severe recurrent major depressive disorder with psychotic features ([place])  Long Term Goal(s): Improvement in symptoms so as ready for discharge   Short Term Goals: Ability to identify changes in lifestyle to reduce recurrence of condition will improve Ability to verbalize feelings will improve Ability to disclose and discuss suicidal ideas Ability to demonstrate self-control will improve Ability to identify and  develop effective coping behaviors will improve Ability to maintain clinical measurements within normal limits will improve Compliance with prescribed medications will improve     Medication Management: Evaluate patient's response, side effects, and tolerance of medication regimen.  Therapeutic Interventions: 1 to 1 sessions, Unit Group sessions and Medication administration.  Evaluation of Outcomes: Progressing   RN Treatment Plan for Primary Diagnosis: Severe recurrent major depressive disorder with psychotic features Long Term Goal(s): Knowledge of disease and therapeutic regimen to maintain health will improve  Short Term  Goals: Ability to remain free from injury will improve, Ability to verbalize frustration and anger appropriately will improve, Ability to demonstrate self-control, Ability to participate in decision making will improve, Ability to verbalize feelings will improve, Ability to disclose and discuss suicidal ideas, Ability to identify and develop effective coping behaviors will improve, and Compliance with prescribed medications will improve  Medication Management: RN will administer medications as ordered by provider, will assess and evaluate patient's response and provide education to patient for prescribed medication. RN will report any adverse and/or side effects to prescribing provider.  Therapeutic Interventions: 1 on 1 counseling sessions, Psychoeducation, Medication administration, Evaluate responses to treatment, Monitor vital signs and CBGs as ordered, Perform/monitor CIWA, COWS, AIMS and Fall Risk screenings as ordered, Perform wound care treatments as ordered.  Evaluation of Outcomes: Progressing   LCSW Treatment Plan for Primary Diagnosis: Severe recurrent major depressive disorder with psychotic features Long Term Goal(s): Safe transition to appropriate next level of care at discharge, Engage patient in therapeutic group addressing interpersonal  concerns.  Short Term Goals: Engage patient in aftercare planning with referrals and resources, Increase social support, Increase ability to appropriately verbalize feelings, Increase emotional regulation, Facilitate acceptance of mental health diagnosis and concerns, Facilitate patient progression through stages of change regarding substance use diagnoses and concerns, Identify triggers associated with mental health/substance abuse issues, and Increase skills for wellness and recovery  Therapeutic Interventions: Assess for all discharge needs, 1 to 1 time with Social worker, Explore available resources and support systems, Assess for adequacy in community support network, Educate family and significant other(s) on suicide prevention, Complete Psychosocial Assessment, Interpersonal group therapy.  Evaluation of Outcomes: Progressing   Progress in Treatment: Attending groups: Yes. Participating in groups: Yes. Taking medication as prescribed: Yes. Toleration medication: Yes. Family/Significant other contact made: No, will contact:   Peggy Mohiuddin, son, [phone] Patient understands diagnosis: Yes. Discussing patient identified problems/goals with staff: Yes. Medical problems stabilized or resolved: Yes. Denies suicidal/homicidal ideation: Yes. Issues/concerns per patient self-inventory: Yes. Other: none  New problem(s) identified: No, Describe:  none  New Short Term/Long Term Goal(s): Patient to work towards elimination of symptoms of psychosis, medication management for mood stabilization; development of comprehensive mental wellness plan.  Patient Goals:  No additional goals identified at this time. Patient to continue to work towards original goals identified in initial treatment team meeting. CSW will remain available to patient should they voice additional treatment goals.   Discharge Plan or Barriers: No psychosocial barriers identified at this time, patient to return to place of  residence when appropriate for discharge.   Reason for Continuation of Hospitalization: Delusions  Medication stabilization  Estimated Length of Stay: 1-7 days   Last 3 [place] Suicide Severity Risk Score: [place] Admission (Current) from 11/21/2022 in [place] Most recent reading at 11/21/2022 11:00 AM ED to Hosp-Admission (Discharged) from 11/17/2022 in [place] Most recent reading at 11/17/2022  7:46 PM ED from 11/17/2022 in [facility] Most recent reading at 11/17/2022  2:07 PM  C-SSRS RISK CATEGORY No Risk No Risk No Risk       Last PHQ 2/9 Scores:    03/17/2015    1:56 PM  Depression screen PHQ 2/9  Decreased Interest 1  Down, Depressed, Hopeless 1  PHQ - 2 Score 2  Altered sleeping 3  Tired, decreased energy 0  Change in appetite 0  Feeling bad or failure about yourself  0  Trouble concentrating 1  Moving slowly or fidgety/restless 0  Suicidal thoughts 0  PHQ-9 Score 6    Scribe for Treatment Team: Larose Kells 11/27/2022 1:02 PM

## 2022-11-28 DIAGNOSIS — F333 Major depressive disorder, recurrent, severe with psychotic symptoms: Secondary | ICD-10-CM | POA: Diagnosis not present

## 2022-11-28 MED ORDER — IBUPROFEN 200 MG PO TABS: 600.0000 mg | ORAL_TABLET | Freq: Four times a day (QID) | ORAL | Status: DC | PRN

## 2022-11-28 NOTE — Progress Notes (Signed)
[facility] MD Progress Note  11/28/2022 11:16 AM Brad [name]  MRN:  [id number] Subjective: Patient seen for follow-up.  67 year old man with a history of mental health issues.  Patient denies any psychiatric symptoms.  Denies depression denies hallucinations.  He does complain of having pain which he localizes to his lungs perhaps or some part of his breathing apparatus and which he blames on micro plastics in the air vents of his rented apartment.  No behavior problems here.  No aggression no evidence of acute dangerousness. Principal Problem: Severe recurrent major depressive disorder with psychotic features Diagnosis: Principal Problem:   Severe recurrent major depressive disorder with psychotic features ([place])  Total Time spent with patient: 30 minutes  Past Psychiatric History: Past history of mood symptoms and anxiety and chronic somatic issues  Past Medical History:  Past Medical History:  Diagnosis Date   Allergy    Anxiety    Chest pain    Depression    DJD (degenerative joint disease), cervical    postiton with pillow under knees, cant turn neck    Dysentery, amebic, acute 08/31/1979   GERD (gastroesophageal reflux disease)    H/O bronchitis    H/O malaria 08/30/1982   Hearing loss    bilateral    Hypertension    labile Blood pressure   Inguinal hernia    Insomnia    early morning awakening   MVP (mitral valve prolapse)    "no problems"   Perianal pain    Personal history of colonic polyps    Tinnitus    right ear    Past Surgical History:  Procedure Laterality Date   CARPAL TUNNEL RELEASE  10/06, 5/10   right wrist    CARPAL TUNNEL RELEASE  3/10   left wrist    cervical spine discectomy   09/2005   COLONOSCOPY WITH PROPOFOL N/A 10/21/2015   Procedure: COLONOSCOPY WITH PROPOFOL;  Surgeon: [name], MD;  Location: [facility];  Service: Endoscopy;  Laterality: N/A;   INGUINAL HERNIA REPAIR  08/16/2011   Procedure: HERNIA REPAIR INGUINAL ADULT;  Surgeon:  [name], MD;  Location: [place];  Service: General;  Laterality: Left;   INGUINAL HERNIA REPAIR Right 05/03/2014   Procedure: OPEN RIGHT INGUINAL HERNIA REPAIR WITH MESH;  Surgeon: [name], MD;  Location: [facility];  Service: General;  Laterality: Right;   INGUINAL HERNIA REPAIR Left 08/20/2022   Procedure: HERNIA REPAIR INGUINAL ADULT;  Surgeon: [name], MD;  Location: [facility];  Service: General;  Laterality: Left;   INSERTION OF MESH Right 05/03/2014   Procedure: INSERTION OF MESH;  Surgeon: [name], MD;  Location: [facility];  Service: General;  Laterality: Right;   TONSILLECTOMY  1960   Family History:  Family History  Problem Relation Age of Onset   Cancer Mother        breat cancer,lung cancer   Intracerebral hemorrhage Father    Family Psychiatric  History: See previous Social History:  Social History   Substance and Sexual Activity  Alcohol Use Yes   Comment: 2 wine daily     Social History   Substance and Sexual Activity  Drug Use Not Currently   Types: Marijuana   Comment: weekend use    Social History   Socioeconomic History   Marital status: Married    Spouse name: Not on file   Number of children: 2   Years of education: Not on file   Highest education level: Professional  school degree (e.g., MD, DDS, DVM, JD)  Occupational History   Occupation: psychologist  Tobacco Use   Smoking status: Never   Smokeless tobacco: Never  Vaping Use   Vaping Use: Never used  Substance and Sexual Activity   Alcohol use: Yes    Comment: 2 wine daily   Drug use: Not Currently    Types: Marijuana    Comment: weekend use   Sexual activity: Yes  Other Topics Concern   Not on file  Social History Narrative   Lives in single level home. He is a self employed Investment banker, operational.     Social Determinants of Health   Financial Resource Strain: Not on file  Food Insecurity: No Food Insecurity (11/21/2022)   Hunger Vital Sign    Worried  About Running Out of Food in the Last Year: Never true    Ran Out of Food in the Last Year: Never true  Transportation Needs: No Transportation Needs (11/21/2022)   PRAPARE - Hydrologist (Medical): No    Lack of Transportation (Non-Medical): No  Physical Activity: Not on file  Stress: Not on file  Social Connections: Not on file   Additional Social History:                         Sleep: Fair  Appetite:  Fair  Current Medications: Current Facility-Administered Medications  Medication Dose Route Frequency Provider Last Rate Last Admin   acetaminophen (TYLENOL) tablet 650 mg  650 mg Oral Q6H PRN [name], [name], DO   650 mg at 11/25/22 2317   alum & mag hydroxide-simeth (MAALOX/MYLANTA) 200-200-20 MG/5ML suspension 30 mL  30 mL Oral Q4H PRN [name], [name], DO       ARIPiprazole (ABILIFY) tablet 5 mg  5 mg Oral Daily [name], DO   5 mg at 11/28/22 [id number]   busPIRone (BUSPAR) tablet 20 mg  20 mg Oral TID [name], NP   20 mg at 11/28/22 [id number]   diphenhydrAMINE (BENADRYL) capsule 50 mg  50 mg Oral TID PRN [name] L, DO       Or   diphenhydrAMINE (BENADRYL) injection 50 mg  50 mg Intramuscular TID PRN [name] L, DO       feeding supplement (ENSURE ENLIVE / ENSURE PLUS) liquid 237 mL  237 mL Oral TID BM [name], [name], DO   237 mL at 11/27/22 1331   haloperidol (HALDOL) tablet 5 mg  5 mg Oral TID PRN [name] L, DO   5 mg at 11/21/22 2142   Or   haloperidol lactate (HALDOL) injection 5 mg  5 mg Intramuscular TID PRN [name] L, DO       ibuprofen (ADVIL) tablet 600 mg  600 mg Oral Q6H PRN Jalena Vanderlinden, Madie Reno, MD       LORazepam (ATIVAN) tablet 2 mg  2 mg Oral BID [name], DO   2 mg at 11/28/22 [id number]   magnesium hydroxide (MILK OF MAGNESIA) suspension 30 mL  30 mL Oral Daily PRN [name] L, DO   30 mL at 11/27/22 2114   mirtazapine (REMERON) tablet 30 mg  30 mg  Oral QHS Dixon, Rashaun M, NP   30 mg at 11/27/22 2108   multivitamin with minerals tablet 1 tablet  1 tablet Oral Daily [name], [name], DO   1 tablet at 11/28/22 0842   oxymetazoline (AFRIN) 0.05 % nasal  spray 1 spray  1 spray Each Nare BID Parish Augustine, Madie Reno, MD   1 spray at 11/28/22 [id number]   senna-docusate (Senokot-S) tablet 2 tablet  2 tablet Oral QHS [name], DO   2 tablet at 11/27/22 2104   traZODone (DESYREL) tablet 50 mg  50 mg Oral QHS [name], NP   50 mg at 11/27/22 2104   zolpidem (AMBIEN) tablet 5 mg  5 mg Oral QHS [name], DO   5 mg at 11/27/22 2104    Lab Results: No results found for this or any previous visit (from the past 48 hour(s)).  Blood Alcohol level:  Lab Results  Component Value Date   ETH <10 11/17/2022   ETH <10 [id number]    Metabolic Disorder Labs: Lab Results  Component Value Date   HGBA1C 5.6 11/24/2022   MPG 114 11/24/2022   MPG 111.15 02/28/2019   No results found for: "PROLACTIN" Lab Results  Component Value Date   CHOL 175 11/24/2022   TRIG 86 11/24/2022   HDL 68 11/24/2022   CHOLHDL 2.6 11/24/2022   VLDL 17 11/24/2022   LDLCALC 90 11/24/2022   LDLCALC 91 02/28/2019    Physical Findings: AIMS:  , ,  ,  ,    CIWA:    COWS:     Musculoskeletal: Strength & Muscle Tone: within normal limits Gait & Station: normal Patient leans: N/A  Psychiatric Specialty Exam:  Presentation  General Appearance:  Appropriate for Environment; Casual  Eye Contact: Good  Speech: Clear and Coherent; Normal Rate  Speech Volume: Normal  Handedness: Right   Mood and Affect  Mood: Euthymic  Affect: Appropriate; Congruent   Thought Process  Thought Processes: Irrevelant  Descriptions of Associations:Intact  Orientation:Full (Time, Place and Person)  Thought Content:Paranoid Ideation; Perseveration; Tangential; Delusions  History of Schizophrenia/Schizoaffective disorder:Yes  Duration of  Psychotic Symptoms:Greater than six months  Hallucinations:No data recorded Ideas of Reference:Delusions; Paranoia; Percusatory  Suicidal Thoughts:No data recorded Homicidal Thoughts:No data recorded  Sensorium  Memory: Immediate Fair; Recent Fair; Remote Fair  Judgment: Fair  Insight: Fair   Materials engineer: Fair  Attention Span: Good  Recall: Good  Fund of Knowledge: Good  Language: Fair   Psychomotor Activity  Psychomotor Activity:No data recorded  Assets  Assets: Social Support; Armed forces logistics/support/administrative officer; Desire for Improvement; Housing; Transportation; Chartered certified accountant; Leisure Time; Physical Health; Financial Resources/Insurance; Resilience; Talents/Skills   Sleep  Sleep:No data recorded   Physical Exam: Physical Exam Vitals and nursing note reviewed.  Constitutional:      Appearance: Normal appearance.  HENT:     Head: Normocephalic and atraumatic.     Mouth/Throat:     Pharynx: Oropharynx is clear.  Eyes:     Pupils: Pupils are equal, round, and reactive to light.  Cardiovascular:     Rate and Rhythm: Normal rate and regular rhythm.  Pulmonary:     Effort: Pulmonary effort is normal.     Breath sounds: Normal breath sounds.  Abdominal:     General: Abdomen is flat.     Palpations: Abdomen is soft.  Musculoskeletal:        General: Normal range of motion.  Skin:    General: Skin is warm and dry.  Neurological:     General: No focal deficit present.     Mental Status: He is alert. Mental status is at baseline.  Psychiatric:        Attention and Perception: Attention normal.  Mood and Affect: Mood normal.        Speech: Speech normal.        Behavior: Behavior normal.        Thought Content: Thought content normal.        Cognition and Memory: Cognition normal.        Judgment: Judgment normal.    Review of Systems  Constitutional: Negative.   HENT: Negative.    Eyes: Negative.   Respiratory:  Negative.    Cardiovascular: Negative.   Gastrointestinal: Negative.   Musculoskeletal: Negative.   Skin: Negative.   Neurological: Negative.   Psychiatric/Behavioral: Negative.     Blood pressure 101/63, pulse 77, temperature 97.7 F (36.5 C), temperature source Oral, resp. rate 16, height 5\' 9"  (1.753 m), weight 58 kg, SpO2 100 %. Body mass index is 18.88 kg/m.   Treatment Plan Summary: Medication management and Plan I ordered Motrin in addition to his ibuprofen.  Patient had requested Percocet which does not seem to be warranted given that he does not appear to be in any particular distress or be having any difficulty breathing.  No other change to medication plan.  He has signed a 72-hour release plan I encouraged him to discuss this with his primary psychiatrist tomorrow.  Alethia Berthold, MD 11/28/2022, 11:16 AM

## 2022-11-28 NOTE — [facility] Group Notes (Signed)
[facility] Group Notes:  (Nursing/MHT/Case Management/Adjunct)  Date:  11/28/2022  Time:  2:59 PM  Type of Therapy:   outdoor rec.  Participation Level:  Active  Participation Quality:  Appropriate  Affect:  Appropriate  Cognitive:  Appropriate  Insight:  Appropriate  Engagement in Group:  Engaged  Modes of Intervention:  Activity  Summary of Progress/Problems:  BRALON CIMA 11/28/2022, 2:59 PM

## 2022-11-28 NOTE — Progress Notes (Signed)
Patient appears worried and anxious.  Reports he is having pain from the micro plastics in his skin and lungs.  Also reports he has a lump in his throat. No distress noted when patient ate or drank.  Patient compliant with scheduled medications but stated none of the medications help him.  "I don't have anxiety. I am in pain."  Endorses depression. "I shouldn't be here."  Patient denies SI/HI and AVH.  Patient's speech is argumentative.  15 min checks in place for safety. Patient went out to courtyard for fresh air.

## 2022-11-28 NOTE — Plan of Care (Signed)
  Problem: Activity: Goal: Risk for activity intolerance will decrease Outcome: Progressing   Problem: Nutrition: Goal: Adequate nutrition will be maintained Outcome: Progressing   Problem: Coping: Goal: Level of anxiety will decrease Outcome: Progressing   Problem: Pain Managment: Goal: General experience of comfort will improve Outcome: Progressing   

## 2022-11-28 NOTE — BH Assessment (Signed)
1905 Received patient   2030 Patient is in the day interacting with other patients. He is alert,  and have a flat affect. He is making negative comments about about the unit, dayroom and just things in general. He is attempting to is including other patients in his discontent.  2130 Patient yells at the charge nurse and saying " What the fuck is she doing/" Patient is referring to his assigned nurse that has gone to prepare medications for administration.   2200 Patient reminded that the provider's order is for 2200,.Also that both nurses can not be in the medication room at the same time; someone must be on the unit for observation. Security was asked to stand by in case patient escalated further.   He was medication compliant and returned to his room after he reluctantly participated in a mouth check. Will continue to monitor patient for safety.  0200 Patient has rested quietly in bed since receiving his medication. Will continue to monitor for safety.   0651 Patient remains in bed asleep, he has not displayed any further varbal aggression toward staff.

## 2022-11-28 NOTE — Group Note (Signed)
Date:  11/28/2022 Time:  10:57 AM  Group Topic/Focus:  Healthy Communication:   The focus of this group is to discuss communication, barriers to communication, as well as healthy ways to communicate with others. Identifying Needs:   The focus of this group is to help patients identify their personal needs that have been historically problematic and identify healthy behaviors to address their needs. Making Healthy Choices:   The focus of this group is to help patients identify negative/unhealthy choices they were using prior to admission and identify positive/healthier coping strategies to replace them upon discharge. Personal Choices and Values:   The focus of this group is to help patients assess and explore the importance of values in their lives, how their values affect their decisions, how they express their values and what opposes their expression. Self Care:   The focus of this group is to help patients understand the importance of self-care in order to improve or restore emotional, physical, spiritual, interpersonal, and financial health.    Participation Level:  Did Not Attend  Participation Quality:   None  Affect:   N/A  Cognitive:   N/A  Insight: None  Engagement in Group:  None  Modes of Intervention:   N/A  Additional Comments:    Avis Epley 11/28/2022, 10:57 AM

## 2022-11-29 DIAGNOSIS — F333 Major depressive disorder, recurrent, severe with psychotic symptoms: Secondary | ICD-10-CM | POA: Diagnosis not present

## 2022-11-29 MED ORDER — BUSPIRONE HCL 10 MG PO TABS: 20.0000 mg | ORAL_TABLET | Freq: Three times a day (TID) | ORAL | 1 refills | Status: AC

## 2022-11-29 MED ORDER — TRAZODONE HCL 50 MG PO TABS: 50.0000 mg | ORAL_TABLET | Freq: Every evening | ORAL | 1 refills | Status: AC | PRN

## 2022-11-29 MED ORDER — LORAZEPAM 2 MG PO TABS: 2.0000 mg | ORAL_TABLET | Freq: Two times a day (BID) | ORAL | 0 refills | Status: AC

## 2022-11-29 MED ORDER — ARIPIPRAZOLE 5 MG PO TABS: 5.0000 mg | ORAL_TABLET | Freq: Every day | ORAL | 1 refills | Status: AC

## 2022-11-29 MED ORDER — MIRTAZAPINE 30 MG PO TABS: 30.0000 mg | ORAL_TABLET | Freq: Every day | ORAL | 1 refills | Status: AC

## 2022-11-29 NOTE — Progress Notes (Signed)
  [facility] Adult Case Management Discharge Plan :  Will you be returning to the same living situation after discharge:  Yes,  pt reports that he is returning home.  At discharge, do you have transportation home?: Yes,  CSW to assist with transportation needs.  Do you have the ability to pay for your medications: MEDICARE / MEDICARE PART A AND B   Release of information consent forms completed and in the chart;  Patient's signature needed at discharge.  Patient to Follow up at:  [place].   Specialty: Behavioral Health Why: Appointment is scheduled for 12/22/2022 at 9:00AM. Please bring picture ID adn insurance card. Contact information: [address] [place] [address] [phone]                 Next level of care provider has access to [place] and Suicide Prevention discussed: Yes,  SPE completed with the patient.       Has patient been referred to the Quitline?: N/A patient is not a smoker  Patient has been referred for addiction treatment: Pt. refused referral  Rozann Lesches, LCSW 11/29/2022, 11:23 AM

## 2022-11-29 NOTE — Progress Notes (Signed)
Patient ID: Brad [name], male   DOB: 1956-01-29, 67 y.o.   MRN: [id number] Addendum note: I had rounded on this patient for the past 4 days and was asked to offer an opinion regarding his capacity to make decisions.  The patient is legally competent as he has not been declared incompetent by a judge.  During the last few days the patient's interactions with me have been calm and appropriate.  He speaks in a normal tone of voice with appropriate eye contact.  He has been fully alert and oriented.  There have been no signs of delirium.  Patient has stated that his mood is good if slightly irritated at still being in the hospital.  He has absolutely denied any thoughts of self-harm or harm to anyone else.  When asked to discuss his medical issues he does persist in believing that Micro plastics in his home's ventilation system have caused damage to his lungs resulting in some subjective difficulty breathing.  Nothing else he has said has appeared to be psychotic.  Even this is a belief that is within the realm of some idiosyncratic thinking that does not reach the level of psychosis.  The patient has stated that he wishes to leave the hospital that he has places to live that he has access to money and that his intention ultimately is to go to [place] where evidently it has been confirmed that he does own a home.  I saw nothing in my evaluation of the patient to suggest that he meets commitment criteria or that he in any way lacks the kind of general capacity that would require guardianship.  Therefore I concur with Dr. Carlton Adam statement that the patient has capacity to make decisions for himself

## 2022-11-29 NOTE — Care Management Important Message (Signed)
Important Message  Patient Details  Name: [name] MRN: [id number] Date of Birth: 11/23/1955   Medicare Important Message Given:  Yes     Rozann Lesches, LCSW 11/29/2022, 11:32 AM

## 2022-11-29 NOTE — Group Note (Signed)
Date:  11/29/2022 Time:  3:34 PM  Group Topic/Focus:  Chair Yoga    Participation Level:  Minimal  Participation Quality:  Inattentive  Affect:  Anxious  Cognitive:  Alert  Insight: Improving  Engagement in Group:  Limited  Modes of Intervention:  Activity  Additional Comments:  Patient was only in the group for a portion of the time and did not engage in the activity.  Brad [name] 11/29/2022, 3:34 PM

## 2022-11-29 NOTE — Progress Notes (Signed)
Patient ID: Brad [name], male   DOB: 1955-11-12, 67 y.o.   MRN: [id number]  Patient was discharged from the [facility] unit at approx 1625 escorted by staff. Patient denies SI/HI/AVH. Discharge packet to include printed AVS, Suicide Risk Assessment, and Transition Record reviewed with patient. Belongings to include a cell phone and a set of keys returned and patient verified receipt with signature. Suicide safety plan completed with a copy kept in chart.

## 2022-11-29 NOTE — Group Note (Signed)
Recreation Therapy Group Note   Group Topic:General Recreation  Group Date: 11/29/2022 Start Time: 1400 End Time: 1455 Facilitators: Vilma Prader, LRT, CTRS Location: Courtyard  Group Description: Outdoor Recreation. Patients had the option to play connect four, corn hole, or play with a deck of cards while outside in the courtyard getting fresh air and sunlight. LRT played music in the background on the speaker. LRT and pts discussed things that they enjoy doing in their free time outside of the hospital.  Affect/Mood: N/A   Participation Level: Did not attend    Clinical Observations/Individualized Feedback: Brad [name] was encouraged to attend group outside, however he said "I would like to solve my problem first." while standing at the phone.   Plan: Continue to engage patient in RT group sessions 2-3x/week.   Vilma Prader, LRT, CTRS 11/29/2022 3:18 PM

## 2022-11-29 NOTE — Group Note (Signed)
[facility] LCSW Group Therapy Note    Group Date: 11/29/2022 Start Time: [id number] End Time: 1345  Type of Therapy and Topic:  Group Therapy:  Overcoming Obstacles  Participation Level:  [facility] PARTICIPATION LEVEL: None  Mood:  Description of Group:   In this group patients will be encouraged to explore what they see as obstacles to their own wellness and recovery. They will be guided to discuss their thoughts, feelings, and behaviors related to these obstacles. The group will process together ways to cope with barriers, with attention given to specific choices patients can make. Each patient will be challenged to identify changes they are motivated to make in order to overcome their obstacles. This group will be process-oriented, with patients participating in exploration of their own experiences as well as giving and receiving support and challenge from other group members.  Therapeutic Goals: 1. Patient will identify personal and current obstacles as they relate to admission. 2. Patient will identify barriers that currently interfere with their wellness or overcoming obstacles.  3. Patient will identify feelings, thought process and behaviors related to these barriers. 4. Patient will identify two changes they are willing to make to overcome these obstacles:    Summary of Patient Progress Patient was present in group. Patient did not engage in discussions.  CSW observed patient to sigh several times.    Therapeutic Modalities:   Cognitive Behavioral Therapy Solution Focused Therapy Motivational Interviewing Relapse Prevention Therapy   Rozann Lesches, LCSW

## 2022-11-29 NOTE — Plan of Care (Signed)
  Problem: Education: Goal: Knowledge of General Education information will improve Description Including pain rating scale, medication(s)/side effects and non-pharmacologic comfort measures Outcome: Progressing   Problem: Health Behavior/Discharge Planning: Goal: Ability to manage health-related needs will improve Outcome: Progressing   

## 2022-11-29 NOTE — Discharge Summary (Signed)
Physician Discharge Summary Note  Patient:  Brad [name] is an 67 y.o., male MRN:  [id number] DOB:  [birth year] Patient phone:  [phone] (home)  Patient address:   [place] [address],  Total Time spent with patient: 1 hour  Date of Admission:  11/21/2022 Date of Discharge: 11/29/2022  Reason for Admission:   Brad [name] is a 67 year old white male who is involuntarily admitted to inpatient psychiatry for depression with psychotic features.  He has been to the emergency room approximately 30 times over the last couple months.  He has been diagnosed with anxiety, somatization disorder, malingering, and depression.  He sees Brad [name] on an outpatient basis but has not seen him in a couple months.  He is on BuSpar, Ativan, Remeron, trazodone and Ambien.  He denies any suicidal ideation.  He denies any auditory or visual hallucinations.  He believes that his heating and air conditioning ducts are spewing out micro plastics and that are clogging up his lungs and making it difficult for him to breathe.  He has been medically worked up and discharged.  He was first psychiatrically hospitalized in his 34s for depression and most recently in 2020 at [facility] behavioral health  and at [facility] in the same year.  He is married and rents a place in [place].  He has a son in his 57s named Brad [name] who he states is part of the commitment to [facility]   Principal Problem: Severe recurrent major depressive disorder with psychotic features Discharge Diagnoses: Principal Problem:   Severe recurrent major depressive disorder with psychotic features   Past Psychiatric History: Extensive history of depression and anxiety involving delusions of mostly somatic nature.  History of outpatient with Brad [name].  Numerous hospitalizations for his delusions.  No history of self-harm.  Past Medical History:  Past Medical History:  Diagnosis Date   Allergy    Anxiety    Chest pain    Depression    DJD  (degenerative joint disease), cervical    postiton with pillow under knees, cant turn neck    Dysentery, amebic, acute 08/31/1979   GERD (gastroesophageal reflux disease)    H/O bronchitis    H/O malaria 08/30/1982   Hearing loss    bilateral    Hypertension    labile Blood pressure   Inguinal hernia    Insomnia    early morning awakening   MVP (mitral valve prolapse)    "no problems"   Perianal pain    Personal history of colonic polyps    Tinnitus    right ear    Past Surgical History:  Procedure Laterality Date   CARPAL TUNNEL RELEASE  10/06, 5/10   right wrist    CARPAL TUNNEL RELEASE  3/10   left wrist    cervical spine discectomy   09/2005   COLONOSCOPY WITH PROPOFOL N/A 10/21/2015   Procedure: COLONOSCOPY WITH PROPOFOL;  Surgeon: Brad Fair, MD;  Location: [facility];  Service: Endoscopy;  Laterality: N/A;   INGUINAL HERNIA REPAIR  08/16/2011   Procedure: HERNIA REPAIR INGUINAL ADULT;  Surgeon: Brad Earls, MD;  Location: [place];  Service: General;  Laterality: Left;   INGUINAL HERNIA REPAIR Right 05/03/2014   Procedure: OPEN RIGHT INGUINAL HERNIA REPAIR WITH MESH;  Surgeon: Brad Lim, MD;  Location: [facility];  Service: General;  Laterality: Right;   INGUINAL HERNIA REPAIR Left 08/20/2022   Procedure: HERNIA REPAIR INGUINAL ADULT;  Surgeon: Brad Mesa, MD;  Location:  [facility];  Service: General;  Laterality: Left;   INSERTION OF MESH Right 05/03/2014   Procedure: INSERTION OF MESH;  Surgeon: Brad Lim, MD;  Location: [facility];  Service: General;  Laterality: Right;   TONSILLECTOMY  1960   Family History:  Family History  Problem Relation Age of Onset   Cancer Mother        breat cancer,lung cancer   Intracerebral hemorrhage Father    Family Psychiatric  History: Unremarkable Social History:  Social History   Substance and Sexual Activity  Alcohol Use Yes   Comment: 2 wine daily     Social History   Substance and Sexual  Activity  Drug Use Not Currently   Types: Marijuana   Comment: weekend use    Social History   Socioeconomic History   Marital status: Married    Spouse name: Not on file   Number of children: 2   Years of education: Not on file   Highest education level: Professional school degree (e.g., MD, DDS, DVM, JD)  Occupational History   Occupation: psychologist  Tobacco Use   Smoking status: Never   Smokeless tobacco: Never  Vaping Use   Vaping Use: Never used  Substance and Sexual Activity   Alcohol use: Yes    Comment: 2 wine daily   Drug use: Not Currently    Types: Marijuana    Comment: weekend use   Sexual activity: Yes  Other Topics Concern   Not on file  Social History Narrative   Lives in single level home. He is a self employed Investment banker, operational.     Social Determinants of Health   Financial Resource Strain: Not on file  Food Insecurity: No Food Insecurity (11/21/2022)   Hunger Vital Sign    Worried About Running Out of Food in the Last Year: Never true    Ran Out of Food in the Last Year: Never true  Transportation Needs: No Transportation Needs (11/21/2022)   PRAPARE - Hydrologist (Medical): No    Lack of Transportation (Non-Medical): No  Physical Activity: Not on file  Stress: Not on file  Social Connections: Not on file    Hospital Course: Brad [name] was involuntarily admitted to geriatric psychiatry after numerous visits to the emergency room for somatic complaints.  He presented with delusions that his HVAC system was throwing micro-plastics into his house and causing his lungs to fill out.  He had been to the ER approximately 30 times in the last couple months.  While on the inpatient unit he was pleasant and cooperative.  We restarted all of his medical medications and psychiatric medications and added Abilify 5 mg/day.  He was pleasant and cooperative on the unit.  He was compliant with his medications.  His demeanor improved and his  affect became less paranoid.  He did not talk about somatic complaints as much.  He did have some issues with his son who has healthcare power of attorney but not guardianship.  [name] remained completely capable of making his own decisions during this hospitalization.  His commitment expired and it was felt that he maximize hospitalization he was discharged home.  On the day of discharge she denied suicidal ideation, homicidal ideation, auditory or visual hallucinations.  Judgment and insight were good.  Physical Findings: AIMS:  , ,  ,  ,    CIWA:    COWS:     Musculoskeletal: Strength & Muscle Tone: within normal limits Gait & Station:  normal Patient leans: N/A   Psychiatric Specialty Exam:  Presentation  General Appearance:  Appropriate for Environment; Casual  Eye Contact: Good  Speech: Clear and Coherent; Normal Rate  Speech Volume: Normal  Handedness: Right   Mood and Affect  Mood: Euthymic  Affect: Appropriate; Congruent   Thought Process  Thought Processes: Irrevelant  Descriptions of Associations:Intact  Orientation:Full (Time, Place and Person)  Thought Content:Paranoid Ideation; Perseveration; Tangential; Delusions  History of Schizophrenia/Schizoaffective disorder:Yes  Duration of Psychotic Symptoms:Greater than six months  Hallucinations:No data recorded Ideas of Reference:Delusions; Paranoia; Percusatory  Suicidal Thoughts:No data recorded Homicidal Thoughts:No data recorded  Sensorium  Memory: Immediate [name]; Recent [name]; Remote [name]  Judgment: [name]  Insight: [name]   Materials engineer: [name]  Attention Span: Good  Recall: Good  Fund of Knowledge: Good  Language: [name]   Psychomotor Activity  Psychomotor Activity:No data recorded  Assets  Assets: Social Support; Armed forces logistics/support/administrative officer; Desire for Improvement; Housing; Transportation; Chartered certified accountant; Leisure Time; Physical Health; Financial  Resources/Insurance; Resilience; Talents/Skills   Sleep  Sleep:No data recorded   Physical Exam: Physical Exam Vitals and nursing note reviewed.  Constitutional:      Appearance: Normal appearance. He is normal weight.  Neurological:     General: No focal deficit present.     Mental Status: He is alert and oriented to person, place, and time.  Psychiatric:        Attention and Perception: Attention and perception normal.        Mood and Affect: Mood and affect normal.        Speech: Speech normal.        Behavior: Behavior normal. Behavior is cooperative.        Thought Content: Thought content is paranoid.        Cognition and Memory: Cognition and memory normal.        Judgment: Judgment normal.    Review of Systems  Constitutional: Negative.   HENT: Negative.    Eyes: Negative.   Respiratory: Negative.    Cardiovascular: Negative.   Gastrointestinal: Negative.   Genitourinary: Negative.   Musculoskeletal: Negative.   Skin: Negative.   Neurological: Negative.   Endo/Heme/Allergies: Negative.   Psychiatric/Behavioral: Negative.     He was alert and oriented x 3.  Pleasant and cooperative.  Good eye contact.  Mood and affect were euthymic although a little anxious.  Thought process was goal directed.  Thought content he denies suicidal ideation.  No psychosis.  Judgment and insight were good.  He had complete capacity to make his own decisions.    Blood pressure (!) 131/90, pulse 67, temperature 98.5 F (36.9 C), temperature source Oral, resp. rate 14, height 5\' 9"  (1.753 m), weight 58 kg, SpO2 97 %. Body mass index is 18.88 kg/m.   Social History   Tobacco Use  Smoking Status Never  Smokeless Tobacco Never   Tobacco Cessation:  N/A, patient does not currently use tobacco products   Blood Alcohol level:  Lab Results  Component Value Date   ETH <10 11/17/2022   ETH <10 123XX123    Metabolic Disorder Labs:  Lab Results  Component Value Date   HGBA1C  5.6 11/24/2022   MPG 114 11/24/2022   MPG 111.15 02/28/2019   No results found for: "PROLACTIN" Lab Results  Component Value Date   CHOL 175 11/24/2022   TRIG 86 11/24/2022   HDL 68 11/24/2022   CHOLHDL 2.6 11/24/2022   VLDL 17 11/24/2022   LDLCALC 90 11/24/2022  [place] 91 02/28/2019    See Psychiatric Specialty Exam and Suicide Risk Assessment completed by Attending Physician prior to discharge.  Discharge destination:  Home  Is patient on multiple antipsychotic therapies at discharge:  No   Has Patient had three or more failed trials of antipsychotic monotherapy by history:  No  Recommended Plan for Multiple Antipsychotic Therapies: NA   Allergies as of 11/29/2022       Reactions   Ciprofloxacin Other (See Comments)   Other Reaction(s): Dizziness (intolerance)   Citalopram Other (See Comments)   Duloxetine Other (See Comments)   Manic emotions Other Reaction(s): Other (See Comments)    Manic emotions   Duloxetine Hcl Other (See Comments)   Manic emotions   Fluticasone-umeclidin-vilant Other (See Comments)   Dry cracked lips and mouth   Gabapentin Other (See Comments)   Unable to urinate, drowsiness Unable to urinate, drowsiness    Other Reaction(s): Other (See Comments)   Oxcarbazepine Other (See Comments)   Bad taste in his mouth Other Reaction(s): Other (See Comments)    Bad taste in his mouth        Medication List     STOP taking these medications    hydrOXYzine 25 MG tablet Commonly known as: ATARAX       TAKE these medications      Indication  Alpha-Lipoic Acid 600 MG Caps Take 600 mg by mouth daily.    amLODipine 5 MG tablet Commonly known as: NORVASC Take 2 tablets (10 mg total) by mouth daily.  Indication: High Blood Pressure Disorder   ARIPiprazole 5 MG tablet Commonly known as: ABILIFY Take 1 tablet (5 mg total) by mouth daily.  Indication: Delusions, Delusions of Parasitosis, Major Depressive Disorder   atorvastatin 10  MG tablet Commonly known as: LIPITOR Take 10 mg by mouth 3 (three) times a week. MWF    busPIRone 10 MG tablet Commonly known as: BUSPAR Take 2 tablets (20 mg total) by mouth 3 (three) times daily.  Indication: Anxiety Disorder   Coenzyme Q10 50 MG Caps Take 50 mg by mouth daily.    cyclobenzaprine 10 MG tablet Commonly known as: FLEXERIL Take 1 tablet (10 mg total) by mouth 2 (two) times daily as needed for muscle spasms.    docusate sodium 100 MG capsule Commonly known as: COLACE Take 1 capsule (100 mg total) by mouth every 12 (twelve) hours.    LORazepam 2 MG tablet Commonly known as: ATIVAN Take 1 tablet (2 mg total) by mouth 2 (two) times daily. What changed:  medication strength how much to take    losartan 100 MG tablet Commonly known as: COZAAR Take 100 mg by mouth daily.    meloxicam 7.5 MG tablet Commonly known as: MOBIC Take 7.5 mg by mouth daily.    mirtazapine 30 MG tablet Commonly known as: REMERON Take 1 tablet (30 mg total) by mouth at bedtime.  Indication: Major Depressive Disorder   multivitamin tablet Take 1 tablet by mouth daily.    Omega-3 1000 MG Caps Take 1,000 mg by mouth daily.    polyethylene glycol 17 g packet Commonly known as: MiraLax Take 17 g by mouth 2 (two) times daily.    pregabalin 75 MG capsule Commonly known as: LYRICA Take 75 mg by mouth 2 (two) times daily.    senna-docusate 8.6-50 MG tablet Commonly known as: Senokot-S Take 1 tablet by mouth at bedtime as needed for mild constipation.    traZODone 50 MG tablet Commonly known as: DESYREL  Take 1-2 tablets (50-100 mg total) by mouth at bedtime as needed for sleep.  Indication: Anxiety Disorder, Trouble Sleeping, Major Depressive Disorder   Vitamin D3 125 MCG (5000 UT) Tabs Take 5,000 Units by mouth daily.    zaleplon 10 MG capsule Commonly known as: SONATA Take 10-20 mg by mouth at bedtime as needed for sleep.    zinc gluconate 50 MG tablet Take 50 mg by mouth  daily.          Follow-up recommendations:  Brad [name]    Signed: [name], DO 11/29/2022, 10:03 AM

## 2022-11-29 NOTE — BHH Counselor (Signed)
CSW enters note for continuity of care.   Patients son has indicated that he is healthcare POA [facility]) and requests to be involved in the patients care.  CSW notes that patient has asked for son to not be involved in care and rescinded initial consent for son to be involved.  Patient is his own guardian.    Son has sent copy of HCPOA.  CSW sent HCPOA to supervisor and legal team for review, as pt is adamant that son not be involved and son is adamant that he be involved.    CSW has not communicated with son beyond a voicemail confirming receipt of the [facility] paperwork and that it is currently being reviewed.    Day of discharge CSW spoke with [place], physicians and legal team to determine what responsibility was required in contacting the patients son and making him aware of discharge.    Through several emails it was determined that it is a clinical decision if staff felt that patient could make decisions on his own and the discharge was a safe one then patient can make the determination to not include his son.    CSW was advised to ask patient if he was willing to have CSW inform son of his discharge.  Patient declined.   CSW spoke with physicians who indicated no concerns and stated thoughts that discharge can proceed.    CSW reviewed this with the patient.   Assunta Curtis, MSW, LCSW 11/29/2022 3:24 PM

## 2022-11-29 NOTE — BHH Suicide Risk Assessment (Signed)
[facility] Discharge Suicide Risk Assessment   Principal Problem: Severe recurrent major depressive disorder with psychotic features Discharge Diagnoses: Principal Problem:   Severe recurrent major depressive disorder with psychotic features   Total Time spent with patient: 1 hour  Musculoskeletal: Strength & Muscle Tone: within normal limits Gait & Station: normal Patient leans: N/A  Psychiatric Specialty Exam  Presentation  General Appearance:  Appropriate for Environment; Casual  Eye Contact: Good  Speech: Clear and Coherent; Normal Rate  Speech Volume: Normal  Handedness: Right   Mood and Affect  Mood: Euthymic  Duration of Depression Symptoms: No data recorded Affect: Appropriate; Congruent   Thought Process  Thought Processes: Irrevelant  Descriptions of Associations:Intact  Orientation:Full (Time, Place and Person)  Thought Content:Paranoid Ideation; Perseveration; Tangential; Delusions  History of Schizophrenia/Schizoaffective disorder:Yes  Duration of Psychotic Symptoms:Greater than six months  Hallucinations:No data recorded Ideas of Reference:Delusions; Paranoia; Percusatory  Suicidal Thoughts:No data recorded Homicidal Thoughts:No data recorded  Sensorium  Memory: Immediate Fair; Recent Fair; Remote Fair  Judgment: Fair  Insight: Fair   Materials engineer: Fair  Attention Span: Good  Recall: Good  Fund of Knowledge: Good  Language: Fair   Psychomotor Activity  Psychomotor Activity:No data recorded  Assets  Assets: Social Support; Armed forces logistics/support/administrative officer; Desire for Improvement; Housing; Transportation; Chartered certified accountant; Leisure Time; Physical Health; Financial Resources/Insurance; Resilience; Talents/Skills   Sleep  Sleep:No data recorded   Blood pressure (!) 131/90, pulse 67, temperature 98.5 F (36.9 C), temperature source Oral, resp. rate 14, height 5\' 9"  (1.753 m), weight 58 kg, SpO2 97  %. Body mass index is 18.88 kg/m.  Mental Status Per Nursing Assessment::   On Admission:  NA  Demographic Factors:  Male and Age 68 or older  Loss Factors: NA  Historical Factors: NA  Risk Reduction Factors:   NA  Continued Clinical Symptoms:  Severe Anxiety and/or Agitation  Cognitive Features That Contribute To Risk:  Polarized thinking    Suicide Risk:  Minimal: No identifiable suicidal ideation.  Patients presenting with no risk factors but with morbid ruminations; may be classified as minimal risk based on the severity of the depressive symptoms    Plan Of Care/Follow-up recommendations:  Dr. Gypsy Lore, DO 11/29/2022, 9:48 AM

## 2022-12-06 ENCOUNTER — Emergency Department (HOSPITAL_COMMUNITY)
Admission: EM | Admit: 2022-12-06 | Discharge: 2022-12-07 | Disposition: A | Discharge status: Home / Self Care | Payer: Medicare Other | Attending: Emergency Medicine | Admitting: Emergency Medicine

## 2022-12-06 ENCOUNTER — Other Ambulatory Visit: Payer: Self-pay

## 2022-12-06 ENCOUNTER — Emergency Department (HOSPITAL_COMMUNITY): Payer: Medicare Other

## 2022-12-06 DIAGNOSIS — Z79899 Other long term (current) drug therapy: Secondary | ICD-10-CM | POA: Insufficient documentation

## 2022-12-06 DIAGNOSIS — I10 Essential (primary) hypertension: Secondary | ICD-10-CM | POA: Insufficient documentation

## 2022-12-06 DIAGNOSIS — R079 Chest pain, unspecified: Secondary | ICD-10-CM | POA: Insufficient documentation

## 2022-12-06 LAB — CBC WITH DIFFERENTIAL/PLATELET
Abs Immature Granulocytes: 0.07 10*3/uL (ref 0.00–0.07)
Basophils Absolute: 0 10*3/uL (ref 0.0–0.1)
Basophils Relative: 0 %
Eosinophils Absolute: 0 10*3/uL (ref 0.0–0.5)
Eosinophils Relative: 0 %
HCT: 36.6 % — ABNORMAL LOW (ref 39.0–52.0)
Hemoglobin: 13 g/dL (ref 13.0–17.0)
Immature Granulocytes: 1 %
Lymphocytes Relative: 8 %
Lymphs Abs: 0.8 10*3/uL (ref 0.7–4.0)
MCH: 31.4 pg (ref 26.0–34.0)
MCHC: 35.5 g/dL (ref 30.0–36.0)
MCV: 88.4 fL (ref 80.0–100.0)
Monocytes Absolute: 0.4 10*3/uL (ref 0.1–1.0)
Monocytes Relative: 4 %
Neutro Abs: 8 10*3/uL — ABNORMAL HIGH (ref 1.7–7.7)
Neutrophils Relative %: 87 %
Platelets: 324 10*3/uL (ref 150–400)
RBC: 4.14 MIL/uL — ABNORMAL LOW (ref 4.22–5.81)
RDW: 12.9 % (ref 11.5–15.5)
WBC: 9.3 10*3/uL (ref 4.0–10.5)
nRBC: 0 % (ref 0.0–0.2)

## 2022-12-06 LAB — COMPREHENSIVE METABOLIC PANEL
ALT: 17 U/L (ref 0–44)
AST: 23 U/L (ref 15–41)
Albumin: 4.2 g/dL (ref 3.5–5.0)
Alkaline Phosphatase: 101 U/L (ref 38–126)
Anion gap: 11 (ref 5–15)
BUN: 14 mg/dL (ref 8–23)
CO2: 21 mmol/L — ABNORMAL LOW (ref 22–32)
Calcium: 9.5 mg/dL (ref 8.9–10.3)
Chloride: 94 mmol/L — ABNORMAL LOW (ref 98–111)
Creatinine, Ser: 0.72 mg/dL (ref 0.61–1.24)
GFR, Estimated: >60 mL/min (ref >60)
Glucose, Bld: 117 mg/dL — ABNORMAL HIGH (ref 70–99)
Potassium: 4.2 mmol/L (ref 3.5–5.1)
Sodium: 126 mmol/L — ABNORMAL LOW (ref 135–145)
Total Bilirubin: 0.6 mg/dL (ref 0.3–1.2)
Total Protein: 7.7 g/dL (ref 6.5–8.1)

## 2022-12-06 LAB — TROPONIN I (HIGH SENSITIVITY): Troponin I (High Sensitivity): 5 ng/L (ref <18)

## 2022-12-06 NOTE — ED Triage Notes (Signed)
Arrived via EMS from hotel Chest pain and hernia pain.

## 2022-12-06 NOTE — ED Provider Notes (Signed)
[facility] Provider Note   CSN: [id number] Arrival date & time: 12/06/22  2145     History {Add pertinent medical, surgical, social history, OB history to HPI:1} Chief Complaint  Patient presents with   Chest Pain    Brad [name] is a 67 y.o. male.  The history is provided by the patient and medical records.  Chest Pain  67 y.o. M with hx of atypical chest pain, HTN, anxiety, low back pain, neuropathy, presenting to the ED from local hotel with chest pain.  Patient has been seen frequently for same.  He did have elevated troponins during last visit to the ED, felt to be stress/tachycardic induced.    Home Medications Prior to Admission medications   Medication Sig Start Date End Date Taking? Authorizing Provider  Alpha-Lipoic Acid 600 MG CAPS Take 600 mg by mouth daily.    [provider]  amLODipine (NORVASC) 5 MG tablet Take 2 tablets (10 mg total) by mouth daily. 03/02/19   Malvin Johns, MD  ARIPiprazole (ABILIFY) 5 MG tablet Take 1 tablet (5 mg total) by mouth daily. 11/29/22   Sarina Ill, DO  atorvastatin (LIPITOR) 10 MG tablet Take 10 mg by mouth 3 (three) times a week. MWF 08/10/19   [provider]  busPIRone (BUSPAR) 10 MG tablet Take 2 tablets (20 mg total) by mouth 3 (three) times daily. 11/29/22   Sarina Ill, DO  Cholecalciferol (VITAMIN D3) 125 MCG (5000 UT) TABS Take 5,000 Units by mouth daily.    [provider]  Coenzyme Q10 50 MG CAPS Take 50 mg by mouth daily.    [provider]  cyclobenzaprine (FLEXERIL) 10 MG tablet Take 1 tablet (10 mg total) by mouth 2 (two) times daily as needed for muscle spasms. 11/06/22   Al Decant, PA-C  docusate sodium (COLACE) 100 MG capsule Take 1 capsule (100 mg total) by mouth every 12 (twelve) hours. 09/26/22   Wynetta Fines, MD  LORazepam (ATIVAN) 2 MG tablet Take 1 tablet (2 mg total) by mouth 2 (two) times daily. 11/29/22    Sarina Ill, DO  losartan (COZAAR) 100 MG tablet Take 100 mg by mouth daily. 02/04/20   [provider]  meloxicam (MOBIC) 7.5 MG tablet Take 7.5 mg by mouth daily.    [provider]  mirtazapine (REMERON) 30 MG tablet Take 1 tablet (30 mg total) by mouth at bedtime. 11/29/22   Sarina Ill, DO  Multiple Vitamin (MULTIVITAMIN) tablet Take 1 tablet by mouth daily.    [provider]  Omega-3 1000 MG CAPS Take 1,000 mg by mouth daily.    [provider]  polyethylene glycol (MIRALAX) 17 g packet Take 17 g by mouth 2 (two) times daily. 10/21/22   Marguerita Merles Latif, DO  pregabalin (LYRICA) 75 MG capsule Take 75 mg by mouth 2 (two) times daily.    [provider]  senna-docusate (SENOKOT-S) 8.6-50 MG tablet Take 1 tablet by mouth at bedtime as needed for mild constipation. 10/13/22   Long, Arlyss Repress, MD  traZODone (DESYREL) 50 MG tablet Take 1-2 tablets (50-100 mg total) by mouth at bedtime as needed for sleep. 11/29/22   Sarina Ill, DO  zaleplon (SONATA) 10 MG capsule Take 10-20 mg by mouth at bedtime as needed for sleep.    [provider]  zinc gluconate 50 MG tablet Take 50 mg by mouth daily.    [provider]      Allergies    Ciprofloxacin, Citalopram, Duloxetine, Duloxetine hcl, Fluticasone-umeclidin-vilant, Gabapentin, and Oxcarbazepine    Review of Systems   Review of Systems  Cardiovascular:  Positive for chest pain.  All other systems reviewed and are negative.   Physical Exam Updated Vital Signs BP (!) 145/109   Pulse 93   Temp 97.9 F (36.6 C) (Oral)   Resp (!) 22   SpO2 95%   Physical Exam Vitals and nursing note reviewed.  Constitutional:      Appearance: He is well-developed.  HENT:     Head: Normocephalic and atraumatic.  Eyes:     Conjunctiva/sclera: Conjunctivae normal.     Pupils: Pupils are equal, round, and reactive to light.  Cardiovascular:     Rate and Rhythm:  Normal rate and regular rhythm.     Heart sounds: Normal heart sounds.  Pulmonary:     Effort: Pulmonary effort is normal.     Breath sounds: Normal breath sounds.  Abdominal:     General: Bowel sounds are normal.     Palpations: Abdomen is soft.  Musculoskeletal:        General: Normal range of motion.     Cervical back: Normal range of motion.  Skin:    General: Skin is warm and dry.  Neurological:     Mental Status: He is alert and oriented to person, place, and time.     ED Results / Procedures / Treatments   Labs (all labs ordered are listed, but only abnormal results are displayed) Labs Reviewed  CBC WITH DIFFERENTIAL/PLATELET - Abnormal; Notable for the following components:      Result Value   RBC 4.14 (*)    HCT 36.6 (*)    Neutro Abs 8.0 (*)    All other components within normal limits  COMPREHENSIVE METABOLIC PANEL  TROPONIN I (HIGH SENSITIVITY)    EKG EKG Interpretation  Date/Time:  Monday December 06 2022 21:55:11 EDT Ventricular Rate:  98 PR Interval:  184 QRS Duration: 99 QT Interval:  360 QTC Calculation: 460 R Axis:   -68 Text Interpretation: Sinus rhythm Left anterior fascicular block Abnormal R-wave progression, early transition Confirmed by Kristine Royal [id number]) on 12/06/2022 10:18:05 PM  Radiology No results found.  Procedures Procedures  {Document cardiac monitor, telemetry assessment procedure when appropriate:1}  Medications Ordered in ED Medications - No data to display  ED Course/ Medical Decision Making/ A&P   {   Click here for ABCD2, HEART and other calculatorsREFRESH Note before signing :1}                          Medical Decision Making Amount and/or Complexity of Data Reviewed Labs: ordered. Radiology: ordered.   ***  {Document critical care time when appropriate:1} {Document review of labs and clinical decision tools ie heart score, Chads2Vasc2 etc:1}  {Document your independent review of radiology images, and any  outside records:1} {Document your discussion with family members, caretakers, and with consultants:1} {Document social determinants of health affecting pt's care:1} {Document your decision making why or why not admission, treatments were needed:1} Final Clinical Impression(s) / ED Diagnoses Final diagnoses:  None    Rx / DC Orders ED Discharge Orders     None

## 2022-12-07 DIAGNOSIS — R079 Chest pain, unspecified: Secondary | ICD-10-CM | POA: Diagnosis not present

## 2022-12-07 LAB — TROPONIN I (HIGH SENSITIVITY): Troponin I (High Sensitivity): 5 ng/L (ref <18)

## 2022-12-07 NOTE — Discharge Instructions (Addendum)
Your cardiac tests today were normal. Follow-up with your primary care doctor. Return here for new concerns.

## 2022-12-10 ENCOUNTER — Emergency Department (HOSPITAL_COMMUNITY): Payer: Medicare Other

## 2022-12-10 ENCOUNTER — Emergency Department (HOSPITAL_COMMUNITY)
Admission: EM | Admit: 2022-12-10 | Discharge: 2022-12-10 | Disposition: A | Discharge status: Home / Self Care | Payer: Medicare Other | Attending: Emergency Medicine | Admitting: Emergency Medicine

## 2022-12-10 ENCOUNTER — Encounter (HOSPITAL_COMMUNITY): Payer: Self-pay

## 2022-12-10 ENCOUNTER — Other Ambulatory Visit: Payer: Self-pay

## 2022-12-10 ENCOUNTER — Emergency Department (HOSPITAL_COMMUNITY)
Admission: EM | Admit: 2022-12-10 | Discharge: 2022-12-10 | Disposition: A | Discharge status: Home / Self Care | Payer: Medicare Other | Source: Home / Self Care | Attending: Emergency Medicine | Admitting: Emergency Medicine

## 2022-12-10 DIAGNOSIS — R1030 Lower abdominal pain, unspecified: Secondary | ICD-10-CM | POA: Insufficient documentation

## 2022-12-10 DIAGNOSIS — R1032 Left lower quadrant pain: Secondary | ICD-10-CM | POA: Diagnosis present

## 2022-12-10 DIAGNOSIS — F419 Anxiety disorder, unspecified: Secondary | ICD-10-CM

## 2022-12-10 DIAGNOSIS — R1084 Generalized abdominal pain: Secondary | ICD-10-CM

## 2022-12-10 DIAGNOSIS — R0789 Other chest pain: Secondary | ICD-10-CM

## 2022-12-10 LAB — CBC WITH DIFFERENTIAL/PLATELET
Abs Immature Granulocytes: 0.04 10*3/uL (ref 0.00–0.07)
Basophils Absolute: 0 10*3/uL (ref 0.0–0.1)
Basophils Relative: 0 %
Eosinophils Absolute: 0.1 10*3/uL (ref 0.0–0.5)
Eosinophils Relative: 1 %
HCT: 34.6 % — ABNORMAL LOW (ref 39.0–52.0)
Hemoglobin: 12.2 g/dL — ABNORMAL LOW (ref 13.0–17.0)
Immature Granulocytes: 1 %
Lymphocytes Relative: 17 %
Lymphs Abs: 1.4 10*3/uL (ref 0.7–4.0)
MCH: 31.1 pg (ref 26.0–34.0)
MCHC: 35.3 g/dL (ref 30.0–36.0)
MCV: 88.3 fL (ref 80.0–100.0)
Monocytes Absolute: 0.9 10*3/uL (ref 0.1–1.0)
Monocytes Relative: 11 %
Neutro Abs: 5.6 10*3/uL (ref 1.7–7.7)
Neutrophils Relative %: 70 %
Platelets: 237 10*3/uL (ref 150–400)
RBC: 3.92 MIL/uL — ABNORMAL LOW (ref 4.22–5.81)
RDW: 12.6 % (ref 11.5–15.5)
WBC: 8 10*3/uL (ref 4.0–10.5)
nRBC: 0 % (ref 0.0–0.2)

## 2022-12-10 LAB — BASIC METABOLIC PANEL
Anion gap: 12 (ref 5–15)
BUN: 13 mg/dL (ref 8–23)
CO2: 22 mmol/L (ref 22–32)
Calcium: 9 mg/dL (ref 8.9–10.3)
Chloride: 90 mmol/L — ABNORMAL LOW (ref 98–111)
Creatinine, Ser: 0.7 mg/dL (ref 0.61–1.24)
GFR, Estimated: >60 mL/min (ref >60)
Glucose, Bld: 94 mg/dL (ref 70–99)
Potassium: 3.9 mmol/L (ref 3.5–5.1)
Sodium: 124 mmol/L — ABNORMAL LOW (ref 135–145)

## 2022-12-10 LAB — TROPONIN I (HIGH SENSITIVITY): Troponin I (High Sensitivity): 7 ng/L (ref <18)

## 2022-12-10 MED ORDER — LORAZEPAM 2 MG/ML IJ SOLN
1.0000 mg | Freq: Once | INTRAMUSCULAR | Status: AC
  Administered 2022-12-10: 1 mg via INTRAVENOUS
  Filled 2022-12-10: qty 1

## 2022-12-10 MED ORDER — POLYETHYLENE GLYCOL 3350 17 GM/SCOOP PO POWD: 1.0000 | Freq: Once | ORAL | 0 refills | Status: AC

## 2022-12-10 MED ORDER — ACETAMINOPHEN 500 MG PO TABS
1000.0000 mg | ORAL_TABLET | Freq: Once | ORAL | Status: AC
  Administered 2022-12-10: 1000 mg via ORAL
  Filled 2022-12-10: qty 2

## 2022-12-10 MED ORDER — KETOROLAC TROMETHAMINE 15 MG/ML IJ SOLN
15.0000 mg | Freq: Once | INTRAMUSCULAR | Status: AC
  Administered 2022-12-10: 15 mg via INTRAMUSCULAR
  Filled 2022-12-10: qty 1

## 2022-12-10 NOTE — ED Provider Notes (Signed)
[place] EMERGENCY DEPARTMENT AT [facility] Provider Note   CSN: [id number] Arrival date & time: 12/10/22  1933     History  Chief Complaint  Patient presents with   Chest Pain    Brad [name] is a 67 y.o. male.  66 year old male with prior medical history as detailed below presents for evaluation.  Patient with multiple recent evaluations.  Patient left [facility] earlier this afternoon after being seen there for chronic groin pain.  On my initial evaluation the patient complains of the same groin pain.  Then patient complained of feeling heaviness in his chest.  This is also a frequent chronic complaint of this patient.    The history is provided by the patient and medical records.       Home Medications Prior to Admission medications   Medication Sig Start Date End Date Taking? Authorizing Provider  Alpha-Lipoic Acid 600 MG CAPS Take 600 mg by mouth daily.    [provider]  amLODipine (NORVASC) 5 MG tablet Take 2 tablets (10 mg total) by mouth daily. 03/02/19   Malvin Johns, MD  ARIPiprazole (ABILIFY) 5 MG tablet Take 1 tablet (5 mg total) by mouth daily. 11/29/22   Sarina Ill, DO  atorvastatin (LIPITOR) 10 MG tablet Take 10 mg by mouth 3 (three) times a week. MWF 08/10/19   [provider]  busPIRone (BUSPAR) 10 MG tablet Take 2 tablets (20 mg total) by mouth 3 (three) times daily. 11/29/22   Sarina Ill, DO  Cholecalciferol (VITAMIN D3) 125 MCG (5000 UT) TABS Take 5,000 Units by mouth daily.    [provider]  Coenzyme Q10 50 MG CAPS Take 50 mg by mouth daily.    [provider]  cyclobenzaprine (FLEXERIL) 10 MG tablet Take 1 tablet (10 mg total) by mouth 2 (two) times daily as needed for muscle spasms. 11/06/22   Al Decant, PA-C  docusate sodium (COLACE) 100 MG capsule Take 1 capsule (100 mg total) by mouth every 12 (twelve) hours. 09/26/22   Wynetta Fines, MD  LORazepam (ATIVAN) 2 MG  tablet Take 1 tablet (2 mg total) by mouth 2 (two) times daily. 11/29/22   Sarina Ill, DO  losartan (COZAAR) 100 MG tablet Take 100 mg by mouth daily. 02/04/20   [provider]  meloxicam (MOBIC) 7.5 MG tablet Take 7.5 mg by mouth daily.    [provider]  mirtazapine (REMERON) 30 MG tablet Take 1 tablet (30 mg total) by mouth at bedtime. 11/29/22   Sarina Ill, DO  Multiple Vitamin (MULTIVITAMIN) tablet Take 1 tablet by mouth daily.    [provider]  Omega-3 1000 MG CAPS Take 1,000 mg by mouth daily.    [provider]  polyethylene glycol (MIRALAX) 17 g packet Take 17 g by mouth 2 (two) times daily. 10/21/22   Marguerita Merles Latif, DO  polyethylene glycol powder (MIRALAX) 17 GM/SCOOP powder Take 255 g by mouth once for 1 dose. 12/10/22 12/10/22  Horton, Clabe Seal, DO  pregabalin (LYRICA) 75 MG capsule Take 75 mg by mouth 2 (two) times daily.    [provider]  senna-docusate (SENOKOT-S) 8.6-50 MG tablet Take 1 tablet by mouth at bedtime as needed for mild constipation. 10/13/22   Long, Arlyss Repress, MD  traZODone (DESYREL) 50 MG tablet Take 1-2 tablets (50-100 mg total) by mouth at bedtime as needed for sleep. 11/29/22   Sarina Ill, DO  zaleplon (SONATA) 10  MG capsule Take 10-20 mg by mouth at bedtime as needed for sleep.    [provider]  zinc gluconate 50 MG tablet Take 50 mg by mouth daily.    [provider]      Allergies    Ciprofloxacin, Citalopram, Duloxetine, Duloxetine hcl, Fluticasone-umeclidin-vilant, Gabapentin, and Oxcarbazepine    Review of Systems   Review of Systems  All other systems reviewed and are negative.   Physical Exam Updated Vital Signs BP (!) 154/108 (BP Location: Right Arm)   Pulse 74   Temp 98.5 F (36.9 C)   Resp (!) 27   Ht 5\' 9"  (1.753 m)   Wt 56.7 kg   SpO2 98%   BMI 18.46 kg/m  Physical Exam Vitals and nursing note reviewed.  Constitutional:       General: He is not in acute distress.    Appearance: Normal appearance. He is well-developed.     Comments: Alert, anxious  HENT:     Head: Normocephalic and atraumatic.  Eyes:     Conjunctiva/sclera: Conjunctivae normal.     Pupils: Pupils are equal, round, and reactive to light.  Cardiovascular:     Rate and Rhythm: Normal rate and regular rhythm.     Heart sounds: Normal heart sounds.  Pulmonary:     Effort: Pulmonary effort is normal. No respiratory distress.     Breath sounds: Normal breath sounds.  Abdominal:     General: There is no distension.     Palpations: Abdomen is soft.     Tenderness: There is no abdominal tenderness.  Musculoskeletal:        General: No deformity. Normal range of motion.     Cervical back: Normal range of motion and neck supple.  Skin:    General: Skin is warm and dry.  Neurological:     General: No focal deficit present.     Mental Status: He is alert and oriented to person, place, and time.     ED Results / Procedures / Treatments   Labs (all labs ordered are listed, but only abnormal results are displayed) Labs Reviewed  CBC WITH DIFFERENTIAL/PLATELET - Abnormal; Notable for the following components:      Result Value   RBC 3.92 (*)    Hemoglobin 12.2 (*)    HCT 34.6 (*)    All other components within normal limits  BASIC METABOLIC PANEL - Abnormal; Notable for the following components:   Sodium 124 (*)    Chloride 90 (*)    All other components within normal limits  TROPONIN I (HIGH SENSITIVITY)    EKG EKG Interpretation  Date/Time:  Friday December 10 2022 19:41:14 EDT Ventricular Rate:  73 PR Interval:  65 QRS Duration: 96 QT Interval:  396 QTC Calculation: 437 R Axis:   -46 Text Interpretation: Sinus rhythm Multiple premature complexes, vent & supraven Short PR interval LAD, consider left anterior fascicular block RSR' in V1 or V2, right VCD or RVH Confirmed by Kristine Royal [phone]) on 12/10/2022 7:44:49 PM  Radiology CT  Renal Stone Study  Result Date: 12/10/2022 CLINICAL DATA:  Abdominal/flank pain.  Groin pain. EXAM: CT ABDOMEN AND PELVIS WITHOUT CONTRAST TECHNIQUE: Multidetector CT imaging of the abdomen and pelvis was performed following the standard protocol without IV contrast. RADIATION DOSE REDUCTION: This exam was performed according to the departmental dose-optimization program which includes automated exposure control, adjustment of the mA and/or kV according to patient size and/or use of iterative reconstruction technique. COMPARISON:  10/13/2022.  FINDINGS: Lower chest: No acute abnormality. Hepatobiliary: Liver normal in size and overall attenuation. 9 mm low-attenuation lesion, segment 4 B. mm low-attenuation lesion right lobe dome, both consistent with cysts and both stable. No other liver abnormalities. Normal gallbladder. No bile duct dilation. Pancreas: Unremarkable. No pancreatic ductal dilatation or surrounding inflammatory changes. Spleen: Normal in size without focal abnormality. Adrenals/Urinary Tract: Normal adrenal glands. Kidneys normal in size, orientation and position. No renal mass or stone. No hydronephrosis. Normal ureters. Normal bladder. Stomach/Bowel: Normal stomach. Small bowel and colon are normal in caliber. No wall thickening. No inflammation. Mild to moderate increased stool burden in the colon. Scattered colonic diverticula mostly along the sigmoid. Appendix not definitively seen. No evidence of appendicitis. Vascular/Lymphatic: Aortic atherosclerotic calcifications. No aneurysm no enlarged lymph nodes. Reproductive: Unremarkable. Other: No abdominal wall hernia or abnormality. No abdominopelvic ascites. Musculoskeletal: Levoscoliosis of the lumbar spine. No fracture or acute finding. No bone lesion. Degenerative changes noted throughout the visualized spine. IMPRESSION: 1. No acute findings within the abdomen or pelvis. No renal or ureteral stones or obstructive uropathy. 2. Mild to  moderate increase in the colonic stool burden. 3. Aortic atherosclerosis. Electronically Signed   By: Amie Portland M.D.   On: 12/10/2022 15:25    Procedures Procedures    Medications Ordered in ED Medications - No data to display  ED Course/ Medical Decision Making/ A&P                             Medical Decision Making Amount and/or Complexity of Data Reviewed Labs: ordered.  Risk Prescription drug management.    Medical Screen Complete  This patient presented to the ED with complaint of chest discomfort.  This complaint involves an extensive number of treatment options. The initial differential diagnosis includes, but is not limited to, ACS, anxiety, metabolic abnormality, etc.  This presentation is: Acute, Chronic, Self-Limited, Previously Undiagnosed, Uncertain Prognosis, Complicated, Systemic Symptoms, and Threat to Life/Bodily Function  Patient with multiple recent evaluations for similar complaint presents with persistent complaints of atypical chest discomfort.  Describes symptoms are not consistent with likely ACS.  EKG obtained is without evidence of acute ischemia.  Troponin obtained is at patient's baseline.  Patient is clearly very anxious about his symptoms.  Patient without indication for admission or psychiatric IVC hold.  Patient is advised that close outpatient follow-up is appropriate.  Patient is advised that returning to the ED for multiple evaluations for same complaint in such a short time span is inappropriate.  Strict return precautions given and understood.  Additional history obtained: External records from outside sources obtained and reviewed including prior ED visits and prior Inpatient records.    Lab Tests:  I ordered and personally interpreted labs.  The pertinent results include: CBC, BMP, troponin   Cardiac Monitoring:  The patient was maintained on a cardiac monitor.  I personally viewed and interpreted the cardiac monitor  which showed an underlying rhythm of: NSR   Medicines ordered:  I ordered medication including ativan  for anxiety  Reevaluation of the patient after these medicines showed that the patient: improved   Problem List / ED Course:  Anxiety, atypical chest pain   Reevaluation:  After the interventions noted above, I reevaluated the patient and found that they have: improved  Disposition:  After consideration of the diagnostic results and the patients response to treatment, I feel that the patent would benefit from close outpatient followup.  Final Clinical Impression(s) / ED Diagnoses Final diagnoses:  Atypical chest pain  Anxiety    Rx / DC Orders ED Discharge Orders     None         Wynetta Fines, MD 12/10/22 2230

## 2022-12-10 NOTE — ED Triage Notes (Signed)
Pt BIB EMS from home with c/o groin pain. Vitals WNL.

## 2022-12-10 NOTE — Discharge Instructions (Addendum)
Return for any problem.  ?

## 2022-12-10 NOTE — ED Provider Notes (Signed)
[place] EMERGENCY DEPARTMENT AT [facility] Provider Note   CSN: [id number] Arrival date & time: 12/10/22  1307     History  Chief Complaint  Patient presents with   Groin Pain    Brad [name] is a 67 y.o. male.  67 yo M with a chief complaints of left groin pain.  The patient tells me that he has had severe pain in the area of his hernia.  He also feels like maybe his face is swollen.  Feels like he had trouble breathing at times.   Groin Pain       Home Medications Prior to Admission medications   Medication Sig Start Date End Date Taking? Authorizing Provider  Alpha-Lipoic Acid 600 MG CAPS Take 600 mg by mouth daily.    [provider]  amLODipine (NORVASC) 5 MG tablet Take 2 tablets (10 mg total) by mouth daily. 03/02/19   Malvin Johns, MD  ARIPiprazole (ABILIFY) 5 MG tablet Take 1 tablet (5 mg total) by mouth daily. 11/29/22   Sarina Ill, DO  atorvastatin (LIPITOR) 10 MG tablet Take 10 mg by mouth 3 (three) times a week. MWF 08/10/19   [provider]  busPIRone (BUSPAR) 10 MG tablet Take 2 tablets (20 mg total) by mouth 3 (three) times daily. 11/29/22   Sarina Ill, DO  Cholecalciferol (VITAMIN D3) 125 MCG (5000 UT) TABS Take 5,000 Units by mouth daily.    [provider]  Coenzyme Q10 50 MG CAPS Take 50 mg by mouth daily.    [provider]  cyclobenzaprine (FLEXERIL) 10 MG tablet Take 1 tablet (10 mg total) by mouth 2 (two) times daily as needed for muscle spasms. 11/06/22   Al Decant, PA-C  docusate sodium (COLACE) 100 MG capsule Take 1 capsule (100 mg total) by mouth every 12 (twelve) hours. 09/26/22   Wynetta Fines, MD  LORazepam (ATIVAN) 2 MG tablet Take 1 tablet (2 mg total) by mouth 2 (two) times daily. 11/29/22   Sarina Ill, DO  losartan (COZAAR) 100 MG tablet Take 100 mg by mouth daily. 02/04/20   [provider]  meloxicam (MOBIC) 7.5 MG tablet Take 7.5 mg by mouth  daily.    [provider]  mirtazapine (REMERON) 30 MG tablet Take 1 tablet (30 mg total) by mouth at bedtime. 11/29/22   Sarina Ill, DO  Multiple Vitamin (MULTIVITAMIN) tablet Take 1 tablet by mouth daily.    [provider]  Omega-3 1000 MG CAPS Take 1,000 mg by mouth daily.    [provider]  polyethylene glycol (MIRALAX) 17 g packet Take 17 g by mouth 2 (two) times daily. 10/21/22   Marguerita Merles Latif, DO  pregabalin (LYRICA) 75 MG capsule Take 75 mg by mouth 2 (two) times daily.    [provider]  senna-docusate (SENOKOT-S) 8.6-50 MG tablet Take 1 tablet by mouth at bedtime as needed for mild constipation. 10/13/22   Long, Arlyss Repress, MD  traZODone (DESYREL) 50 MG tablet Take 1-2 tablets (50-100 mg total) by mouth at bedtime as needed for sleep. 11/29/22   Sarina Ill, DO  zaleplon (SONATA) 10 MG capsule Take 10-20 mg by mouth at bedtime as needed for sleep.    [provider]  zinc gluconate 50 MG tablet Take 50 mg by mouth daily.    [provider]      Allergies    Ciprofloxacin, Citalopram, Duloxetine, Duloxetine hcl, Fluticasone-umeclidin-vilant, Gabapentin, and  Oxcarbazepine    Review of Systems   Review of Systems  Physical Exam Updated Vital Signs Ht 5\' 9"  (1.753 m)   Wt 56.7 kg   BMI 18.46 kg/m  Physical Exam Vitals and nursing note reviewed.  Constitutional:      Appearance: He is well-developed.     Comments: Patient appears uncomfortable is moving about the bed.  HENT:     Head: Normocephalic and atraumatic.  Eyes:     Pupils: Pupils are equal, round, and reactive to light.  Neck:     Vascular: No JVD.  Cardiovascular:     Rate and Rhythm: Normal rate and regular rhythm.     Heart sounds: No murmur heard.    No friction rub. No gallop.  Pulmonary:     Effort: No respiratory distress.     Breath sounds: No wheezing.  Abdominal:     General: There is no distension.     Tenderness:  There is no abdominal tenderness. There is no guarding or rebound.  Musculoskeletal:        General: Normal range of motion.     Cervical back: Normal range of motion and neck supple.  Skin:    Coloration: Skin is not pale.     Findings: No rash.  Neurological:     Mental Status: He is alert and oriented to person, place, and time.  Psychiatric:        Behavior: Behavior normal.     ED Results / Procedures / Treatments   Labs (all labs ordered are listed, but only abnormal results are displayed) Labs Reviewed - No data to display  EKG None  Radiology No results found.  Procedures Procedures    Medications Ordered in ED Medications  ketorolac (TORADOL) 15 MG/ML injection 15 mg (15 mg Intramuscular Given 12/10/22 1421)    ED Course/ Medical Decision Making/ A&P                             Medical Decision Making Amount and/or Complexity of Data Reviewed Radiology: ordered.  Risk Prescription drug management.   67 yo M well-known to this emergency department with 35 visits in the past 6 months comes in with a chief complaints of left groin pain.  Patient appears very uncomfortable.  Based on his initial presentation I would suspect this may be a kidney stone.  There is also the chance that the patient is seeking secondary gain with his frequent ED visits.  Will obtain a CT stone study.  Dose of Toradol.  Reassess.  Signed out to Dr. Wilkie Aye, please see her note for further details care in ED.  3:21 PM:  I have discussed the diagnosis/risks/treatment options with the patient.  Evaluation and diagnostic testing in the emergency department does not suggest an emergent condition requiring admission or immediate intervention beyond what has been performed at this time.  They will follow up with PCP. We also discussed returning to the ED immediately if new or worsening sx occur. We discussed the sx which are most concerning (e.g., sudden worsening pain, fever, inability to  tolerate by mouth) that necessitate immediate return. Medications administered to the patient during their visit and any new prescriptions provided to the patient are listed below.  Medications given during this visit Medications  ketorolac (TORADOL) 15 MG/ML injection 15 mg (15 mg Intramuscular Given 12/10/22 1421)     The patient appears reasonably screen and/or stabilized for discharge  and I doubt any other medical condition or other [facility] requiring further screening, evaluation, or treatment in the ED at this time prior to discharge.          Final Clinical Impression(s) / ED Diagnoses Final diagnoses:  None    Rx / DC Orders ED Discharge Orders     None         Melene Plan, DO 12/10/22 1522

## 2022-12-10 NOTE — Discharge Instructions (Signed)
Your CT scan showed moderate stool burden/constipation. Take Miralax as directed and stay well hydrated. Take Tylenol and Ibuprofen as needed for pain control.

## 2022-12-10 NOTE — ED Provider Notes (Signed)
Patient signed out to me by previous provider. Please refer to their note for full HPI.  Briefly this is a 67 year old male who presented to the emergency department with left groin pain.  This has been a common complaint with the patient with multiple reassuring previous workups in the past couple months.  Reported to appear uncomfortable on initial exam so CT renal stone study is pending.  CT scan shows no acute finding except moderate stool burden/constipation.  On reevaluation patient is laying in the bed.  I discussed results with him, he is asking for pain medicine.  I discussed Tylenol and continuing his regimen with MiraLAX and stool softeners as needed.  Abdominal exam is reassuring, lower extremities appear vascularly intact.  I do not feel the patient warrants any further emergent evaluation in the ED.  Patient at this time appears safe and stable for discharge and close outpatient follow up. Discharge plan and strict return to ED precautions discussed, patient verbalizes understanding and agreement.   Rozelle Logan, DO 12/10/22 1703

## 2022-12-10 NOTE — ED Triage Notes (Signed)
Patient BIB EMS from home due to chest and groin pain. Patient states heaviness on the chest starting yesterday. Patient A&Ox4.

## 2022-12-12 ENCOUNTER — Emergency Department (HOSPITAL_COMMUNITY)
Admission: EM | Admit: 2022-12-12 | Discharge: 2022-12-12 | Discharge status: Against Medical Advice | Payer: Medicare Other | Attending: Emergency Medicine | Admitting: Emergency Medicine

## 2022-12-12 ENCOUNTER — Encounter (HOSPITAL_COMMUNITY): Payer: Self-pay

## 2022-12-12 DIAGNOSIS — Z5321 Procedure and treatment not carried out due to patient leaving prior to being seen by health care provider: Secondary | ICD-10-CM | POA: Insufficient documentation

## 2022-12-12 DIAGNOSIS — M542 Cervicalgia: Secondary | ICD-10-CM | POA: Diagnosis present

## 2022-12-12 DIAGNOSIS — R0602 Shortness of breath: Secondary | ICD-10-CM | POA: Diagnosis not present

## 2022-12-12 NOTE — ED Triage Notes (Signed)
Pt c/o right sided neck pain with mvmt. Pt c/o SHOB.   VSS with EMS

## 2022-12-13 ENCOUNTER — Ambulatory Visit: Payer: Medicare Other | Admitting: Internal Medicine

## 2022-12-13 ENCOUNTER — Emergency Department (HOSPITAL_COMMUNITY)
Admission: EM | Admit: 2022-12-13 | Discharge: 2022-12-13 | Disposition: A | Discharge status: Home / Self Care | Payer: Medicare Other | Attending: Emergency Medicine | Admitting: Emergency Medicine

## 2022-12-13 ENCOUNTER — Emergency Department (HOSPITAL_COMMUNITY): Payer: Medicare Other

## 2022-12-13 DIAGNOSIS — R0789 Other chest pain: Secondary | ICD-10-CM | POA: Diagnosis not present

## 2022-12-13 DIAGNOSIS — R079 Chest pain, unspecified: Secondary | ICD-10-CM | POA: Diagnosis present

## 2022-12-13 LAB — CBC WITH DIFFERENTIAL/PLATELET
Abs Immature Granulocytes: 0.04 10*3/uL (ref 0.00–0.07)
Basophils Absolute: 0 10*3/uL (ref 0.0–0.1)
Basophils Relative: 1 %
Eosinophils Absolute: 0.1 10*3/uL (ref 0.0–0.5)
Eosinophils Relative: 2 %
HCT: 37 % — ABNORMAL LOW (ref 39.0–52.0)
Hemoglobin: 12.9 g/dL — ABNORMAL LOW (ref 13.0–17.0)
Immature Granulocytes: 1 %
Lymphocytes Relative: 17 %
Lymphs Abs: 1.4 10*3/uL (ref 0.7–4.0)
MCH: 31.1 pg (ref 26.0–34.0)
MCHC: 34.9 g/dL (ref 30.0–36.0)
MCV: 89.2 fL (ref 80.0–100.0)
Monocytes Absolute: 0.8 10*3/uL (ref 0.1–1.0)
Monocytes Relative: 10 %
Neutro Abs: 5.9 10*3/uL (ref 1.7–7.7)
Neutrophils Relative %: 69 %
Platelets: 214 10*3/uL (ref 150–400)
RBC: 4.15 MIL/uL — ABNORMAL LOW (ref 4.22–5.81)
RDW: 13.1 % (ref 11.5–15.5)
WBC: 8.3 10*3/uL (ref 4.0–10.5)
nRBC: 0 % (ref 0.0–0.2)

## 2022-12-13 LAB — BASIC METABOLIC PANEL
Anion gap: 11 (ref 5–15)
BUN: 16 mg/dL (ref 8–23)
CO2: 22 mmol/L (ref 22–32)
Calcium: 9.2 mg/dL (ref 8.9–10.3)
Chloride: 97 mmol/L — ABNORMAL LOW (ref 98–111)
Creatinine, Ser: 0.72 mg/dL (ref 0.61–1.24)
GFR, Estimated: >60 mL/min (ref >60)
Glucose, Bld: 97 mg/dL (ref 70–99)
Potassium: 3.7 mmol/L (ref 3.5–5.1)
Sodium: 130 mmol/L — ABNORMAL LOW (ref 135–145)

## 2022-12-13 LAB — TROPONIN I (HIGH SENSITIVITY): Troponin I (High Sensitivity): 6 ng/L (ref <18)

## 2022-12-13 MED ORDER — ACETAMINOPHEN 500 MG PO TABS
1000.0000 mg | ORAL_TABLET | Freq: Once | ORAL | Status: AC
  Administered 2022-12-13: 1000 mg via ORAL
  Filled 2022-12-13: qty 2

## 2022-12-13 MED ORDER — LORAZEPAM 2 MG/ML IJ SOLN
1.0000 mg | Freq: Once | INTRAMUSCULAR | Status: AC
  Administered 2022-12-13: 1 mg via INTRAVENOUS
  Filled 2022-12-13: qty 1

## 2022-12-13 MED ORDER — SODIUM CHLORIDE 0.9 % IV BOLUS
500.0000 mL | Freq: Once | INTRAVENOUS | Status: AC
  Administered 2022-12-13: 500 mL via INTRAVENOUS

## 2022-12-13 NOTE — ED Notes (Signed)
Pt laying in bed asleep, equal chest rise and fall noted.

## 2022-12-13 NOTE — ED Provider Notes (Signed)
[place] EMERGENCY DEPARTMENT AT [facility] Provider Note   CSN: [id number] Arrival date & time: 12/13/22  4098     History  Chief Complaint  Patient presents with   Anxiety   Chest Pain    Brad [name] is a 67 y.o. male.  67 year old male with prior medical history as detailed below presents for evaluation.  Patient with frequent ED evaluations for same or similar complaint.  Patient is presenting today complaining of chest pain and shortness of breath.  This is a chronic complaint.  Patient is visibly anxious regarding his symptoms.  Patient was seen by same provider earlier this week for same complaint.    Patient without noted change in presentation or change in complaint.    The history is provided by the patient and medical records.  Chest Pain      Home Medications Prior to Admission medications   Medication Sig Start Date End Date Taking? Authorizing Provider  Alpha-Lipoic Acid 600 MG CAPS Take 600 mg by mouth daily.    [provider]  amLODipine (NORVASC) 5 MG tablet Take 2 tablets (10 mg total) by mouth daily. 03/02/19   Malvin Johns, MD  ARIPiprazole (ABILIFY) 5 MG tablet Take 1 tablet (5 mg total) by mouth daily. 11/29/22   Sarina Ill, DO  atorvastatin (LIPITOR) 10 MG tablet Take 10 mg by mouth 3 (three) times a week. MWF 08/10/19   [provider]  busPIRone (BUSPAR) 10 MG tablet Take 2 tablets (20 mg total) by mouth 3 (three) times daily. 11/29/22   Sarina Ill, DO  Cholecalciferol (VITAMIN D3) 125 MCG (5000 UT) TABS Take 5,000 Units by mouth daily.    [provider]  Coenzyme Q10 50 MG CAPS Take 50 mg by mouth daily.    [provider]  cyclobenzaprine (FLEXERIL) 10 MG tablet Take 1 tablet (10 mg total) by mouth 2 (two) times daily as needed for muscle spasms. 11/06/22   Al Decant, PA-C  docusate sodium (COLACE) 100 MG capsule Take 1 capsule (100 mg total) by mouth every 12  (twelve) hours. 09/26/22   Wynetta Fines, MD  LORazepam (ATIVAN) 2 MG tablet Take 1 tablet (2 mg total) by mouth 2 (two) times daily. 11/29/22   Sarina Ill, DO  losartan (COZAAR) 100 MG tablet Take 100 mg by mouth daily. 02/04/20   [provider]  meloxicam (MOBIC) 7.5 MG tablet Take 7.5 mg by mouth daily.    [provider]  mirtazapine (REMERON) 30 MG tablet Take 1 tablet (30 mg total) by mouth at bedtime. 11/29/22   Sarina Ill, DO  Multiple Vitamin (MULTIVITAMIN) tablet Take 1 tablet by mouth daily.    [provider]  Omega-3 1000 MG CAPS Take 1,000 mg by mouth daily.    [provider]  polyethylene glycol (MIRALAX) 17 g packet Take 17 g by mouth 2 (two) times daily. 10/21/22   Marguerita Merles Latif, DO  pregabalin (LYRICA) 75 MG capsule Take 75 mg by mouth 2 (two) times daily.    [provider]  senna-docusate (SENOKOT-S) 8.6-50 MG tablet Take 1 tablet by mouth at bedtime as needed for mild constipation. 10/13/22   Long, Arlyss Repress, MD  traZODone (DESYREL) 50 MG tablet Take 1-2 tablets (50-100 mg total) by mouth at bedtime as needed for sleep. 11/29/22   Sarina Ill, DO  zaleplon (SONATA) 10 MG capsule Take 10-20 mg by mouth at bedtime as needed  for sleep.    [provider]  zinc gluconate 50 MG tablet Take 50 mg by mouth daily.    [provider]      Allergies    Ciprofloxacin, Citalopram, Duloxetine, Duloxetine hcl, Fluticasone-umeclidin-vilant, Gabapentin, and Oxcarbazepine    Review of Systems   Review of Systems  All other systems reviewed and are negative.   Physical Exam Updated Vital Signs BP (!) 140/89   Pulse 61   Temp 98.4 F (36.9 C) (Oral)   Resp 15   SpO2 100%  Physical Exam Vitals and nursing note reviewed.  Constitutional:      General: He is not in acute distress.    Appearance: Normal appearance. He is well-developed.     Comments: Work, visibly anxious, overly  concerned about his own health  HENT:     Head: Normocephalic and atraumatic.  Eyes:     Conjunctiva/sclera: Conjunctivae normal.     Pupils: Pupils are equal, round, and reactive to light.  Cardiovascular:     Rate and Rhythm: Normal rate and regular rhythm.     Heart sounds: Normal heart sounds.  Pulmonary:     Effort: Pulmonary effort is normal. No respiratory distress.     Breath sounds: Normal breath sounds.  Abdominal:     General: There is no distension.     Palpations: Abdomen is soft.     Tenderness: There is no abdominal tenderness.  Musculoskeletal:        General: No deformity. Normal range of motion.     Cervical back: Normal range of motion and neck supple.  Skin:    General: Skin is warm and dry.  Neurological:     General: No focal deficit present.     Mental Status: He is alert and oriented to person, place, and time.     ED Results / Procedures / Treatments   Labs (all labs ordered are listed, but only abnormal results are displayed) Labs Reviewed  BASIC METABOLIC PANEL - Abnormal; Notable for the following components:      Result Value   Sodium 130 (*)    Chloride 97 (*)    All other components within normal limits  CBC WITH DIFFERENTIAL/PLATELET - Abnormal; Notable for the following components:   RBC 4.15 (*)    Hemoglobin 12.9 (*)    HCT 37.0 (*)    All other components within normal limits  TROPONIN I (HIGH SENSITIVITY)    EKG EKG Interpretation  Date/Time:  Monday December 13 2022 07:48:42 EDT Ventricular Rate:  67 PR Interval:  164 QRS Duration: 103 QT Interval:  425 QTC Calculation: 449 R Axis:   -67 Text Interpretation: poor baseline Normal sinus rhythm Confirmed by Kristine Royal [phone]) on 12/13/2022 8:38:39 AM  Radiology DG Chest 2 View  Result Date: 12/13/2022 CLINICAL DATA:  chest pain EXAM: CHEST - 2 VIEW COMPARISON:  12/06/2022 FINDINGS: Cardiac silhouette enlarged. No evidence of pneumothorax or pleural effusion. No evidence of  pulmonary edema. No osseous abnormalities identified. IMPRESSION: Enlarged cardiac silhouette. Electronically Signed   By: Layla Maw M.D.   On: 12/13/2022 07:59    Procedures Procedures    Medications Ordered in ED Medications  LORazepam (ATIVAN) injection 1 mg (1 mg Intravenous Given 12/13/22 0750)  sodium chloride 0.9 % bolus 500 mL (0 mLs Intravenous Stopped 12/13/22 0914)  acetaminophen (TYLENOL) tablet 1,000 mg (1,000 mg Oral Given 12/13/22 0949)  LORazepam (ATIVAN) injection 1 mg (1 mg Intravenous Given 12/13/22 0949)  ED Course/ Medical Decision Making/ A&P                             Medical Decision Making Amount and/or Complexity of Data Reviewed Labs: ordered. Radiology: ordered.  Risk OTC drugs. Prescription drug management.    Medical Screen Complete  This patient presented to the ED with complaint of anxiety, atypical chest pain.  This complaint involves an extensive number of treatment options. The initial differential diagnosis includes, but is not limited to, metabolic abnormality, etc.  This presentation is: Chronic, Self-Limited, Previously Undiagnosed, Uncertain Prognosis, and Complicated  Patient with multiple recent ED evaluations for similar complaints.  Presentation today is not significantly different from prior presentations.  Obtain screening labs are without significant abnormality.  EKG is without evidence of acute ischemia.  Troponin is at baseline.  Other screening labs are without acute abnormality.  Patient is somewhat improved after administration of benzodiazepines.  Patient is advised to closely follow-up with PCP in the outpatient setting.  Patient is advised that there is no indication of acute medical pathology found on workup today.  Importance of close follow-up was stressed.  Strict return precautions given and understood.   Additional history obtained: External records from outside sources obtained and reviewed  including prior ED visits and prior Inpatient records.    Lab Tests:  I ordered and personally interpreted labs.  The pertinent results include: CBC, BMP, troponin   Imaging Studies ordered:  I ordered imaging studies including chest x-ray I independently visualized and interpreted obtained imaging which showed NAD I agree with the radiologist interpretation.   Cardiac Monitoring:  The patient was maintained on a cardiac monitor.  I personally viewed and interpreted the cardiac monitor which showed an underlying rhythm of: NSR   Medicines ordered:  I ordered medication including ativan  for anxiety  Reevaluation of the patient after these medicines showed that the patient: improved    Problem List / ED Course:  Anxiety, atypical chest pain   Reevaluation:  After the interventions noted above, I reevaluated the patient and found that they have: improved   Disposition:  After consideration of the diagnostic results and the patients response to treatment, I feel that the patent would benefit from close outpatient followup.          Final Clinical Impression(s) / ED Diagnoses Final diagnoses:  Atypical chest pain    Rx / DC Orders ED Discharge Orders     None         Wynetta Fines, MD 12/13/22 1158

## 2022-12-13 NOTE — Discharge Instructions (Signed)
Return for any problem.  ?

## 2022-12-13 NOTE — ED Triage Notes (Signed)
Pt BIBA from hotel c/o anxiety, CP, SHOB. Seen recently for same. VSS

## 2022-12-14 ENCOUNTER — Encounter (HOSPITAL_COMMUNITY): Payer: Self-pay | Admitting: Emergency Medicine

## 2022-12-14 ENCOUNTER — Other Ambulatory Visit: Payer: Self-pay

## 2022-12-14 ENCOUNTER — Emergency Department (HOSPITAL_COMMUNITY)
Admission: EM | Admit: 2022-12-14 | Discharge: 2022-12-15 | Disposition: A | Discharge status: Home / Self Care | Payer: Medicare Other | Attending: Emergency Medicine | Admitting: Emergency Medicine

## 2022-12-14 DIAGNOSIS — Z79899 Other long term (current) drug therapy: Secondary | ICD-10-CM | POA: Diagnosis not present

## 2022-12-14 DIAGNOSIS — R0789 Other chest pain: Secondary | ICD-10-CM | POA: Diagnosis present

## 2022-12-14 DIAGNOSIS — I10 Essential (primary) hypertension: Secondary | ICD-10-CM | POA: Insufficient documentation

## 2022-12-14 DIAGNOSIS — R55 Syncope and collapse: Secondary | ICD-10-CM | POA: Diagnosis not present

## 2022-12-14 DIAGNOSIS — F419 Anxiety disorder, unspecified: Secondary | ICD-10-CM | POA: Diagnosis not present

## 2022-12-14 DIAGNOSIS — R109 Unspecified abdominal pain: Secondary | ICD-10-CM | POA: Diagnosis not present

## 2022-12-14 DIAGNOSIS — R072 Precordial pain: Secondary | ICD-10-CM

## 2022-12-14 DIAGNOSIS — R0602 Shortness of breath: Secondary | ICD-10-CM | POA: Insufficient documentation

## 2022-12-14 DIAGNOSIS — M542 Cervicalgia: Secondary | ICD-10-CM | POA: Diagnosis not present

## 2022-12-14 DIAGNOSIS — E871 Hypo-osmolality and hyponatremia: Secondary | ICD-10-CM | POA: Diagnosis not present

## 2022-12-14 DIAGNOSIS — I959 Hypotension, unspecified: Secondary | ICD-10-CM | POA: Insufficient documentation

## 2022-12-14 DIAGNOSIS — M545 Low back pain, unspecified: Secondary | ICD-10-CM | POA: Insufficient documentation

## 2022-12-14 LAB — CBC WITH DIFFERENTIAL/PLATELET
Abs Immature Granulocytes: 0.04 10*3/uL (ref 0.00–0.07)
Basophils Absolute: 0 10*3/uL (ref 0.0–0.1)
Basophils Relative: 0 %
Eosinophils Absolute: 0 10*3/uL (ref 0.0–0.5)
Eosinophils Relative: 0 %
HCT: 36.4 % — ABNORMAL LOW (ref 39.0–52.0)
Hemoglobin: 12.7 g/dL — ABNORMAL LOW (ref 13.0–17.0)
Immature Granulocytes: 1 %
Lymphocytes Relative: 14 %
Lymphs Abs: 1.2 10*3/uL (ref 0.7–4.0)
MCH: 30.9 pg (ref 26.0–34.0)
MCHC: 34.9 g/dL (ref 30.0–36.0)
MCV: 88.6 fL (ref 80.0–100.0)
Monocytes Absolute: 0.7 10*3/uL (ref 0.1–1.0)
Monocytes Relative: 9 %
Neutro Abs: 6.2 10*3/uL (ref 1.7–7.7)
Neutrophils Relative %: 76 %
Platelets: 214 10*3/uL (ref 150–400)
RBC: 4.11 MIL/uL — ABNORMAL LOW (ref 4.22–5.81)
RDW: 12.9 % (ref 11.5–15.5)
WBC: 8.2 10*3/uL (ref 4.0–10.5)
nRBC: 0 % (ref 0.0–0.2)

## 2022-12-14 LAB — COMPREHENSIVE METABOLIC PANEL
ALT: 30 U/L (ref 0–44)
AST: 30 U/L (ref 15–41)
Albumin: 3.9 g/dL (ref 3.5–5.0)
Alkaline Phosphatase: 86 U/L (ref 38–126)
Anion gap: 11 (ref 5–15)
BUN: 13 mg/dL (ref 8–23)
CO2: 23 mmol/L (ref 22–32)
Calcium: 9.3 mg/dL (ref 8.9–10.3)
Chloride: 95 mmol/L — ABNORMAL LOW (ref 98–111)
Creatinine, Ser: 0.62 mg/dL (ref 0.61–1.24)
GFR, Estimated: >60 mL/min (ref >60)
Glucose, Bld: 98 mg/dL (ref 70–99)
Potassium: 3.7 mmol/L (ref 3.5–5.1)
Sodium: 129 mmol/L — ABNORMAL LOW (ref 135–145)
Total Bilirubin: 0.7 mg/dL (ref 0.3–1.2)
Total Protein: 6.7 g/dL (ref 6.5–8.1)

## 2022-12-14 LAB — LIPASE, BLOOD: Lipase: 82 U/L — ABNORMAL HIGH (ref 11–51)

## 2022-12-14 LAB — URINALYSIS, ROUTINE W REFLEX MICROSCOPIC
Bilirubin Urine: NEGATIVE
Glucose, UA: NEGATIVE mg/dL
Hgb urine dipstick: NEGATIVE
Ketones, ur: NEGATIVE mg/dL
Leukocytes,Ua: NEGATIVE
Nitrite: NEGATIVE
Protein, ur: NEGATIVE mg/dL
Specific Gravity, Urine: 1.008 (ref 1.005–1.030)
pH: 7 (ref 5.0–8.0)

## 2022-12-14 LAB — TROPONIN I (HIGH SENSITIVITY)
Troponin I (High Sensitivity): 7 ng/L (ref <18)
Troponin I (High Sensitivity): 8 ng/L (ref <18)

## 2022-12-14 MED ORDER — LORAZEPAM 2 MG/ML IJ SOLN
1.0000 mg | Freq: Once | INTRAMUSCULAR | Status: AC
  Administered 2022-12-14: 1 mg via INTRAVENOUS
  Filled 2022-12-14: qty 1

## 2022-12-14 NOTE — ED Provider Triage Note (Signed)
Emergency Medicine Provider Triage Evaluation Note  Brad [name] , a 67 y.o. male  was evaluated in triage.  Pt complains of abd pain, CP, SHOB, woke from sleep, pain more or less constant. No history of similar symptoms previously. No history of heart problems.  Hx HTN, hyperlipidemia  Review of Systems  Positive:  Negative:   Physical Exam  BP (!) 151/100 (BP Location: Right Arm)   Pulse 86   Temp 97.9 F (36.6 C) (Oral)   Resp 18   Ht  (1.753 m)   Wt 56.7 kg   SpO2 99%   BMI 18.46 kg/m  Gen:   Awake, no distress   Resp:  Normal effort  MSK:   Moves extremities without difficulty  Other:  Appears uncomfortable   Medical Decision Making  Medically screening exam initiated at 2:58 PM.  Appropriate orders placed.  Brad [name] was informed that the remainder of the evaluation will be completed by another provider, this initial triage assessment does not replace that evaluation, and the importance of remaining in the ED until their evaluation is complete.     Jeannie Fend, PA-C 12/14/22 1459

## 2022-12-14 NOTE — ED Triage Notes (Signed)
Per [facility] pt coming from home c/o chest pain, shortness of breath and anxiety. Patient seen at [facility] yesterday and day before for same complaint. Given 324 aspirin PTA.

## 2022-12-14 NOTE — Discharge Instructions (Signed)
As we discussed, your workup in the ER today was reassuring for acute findings.  Laboratory evaluation, chest x-ray, and EKG did not reveal any emergent concerns.  It is very important that you schedule an outpatient follow-up for management of your chronic health problems.  Return if development of any new or worsening symptoms.

## 2022-12-14 NOTE — ED Provider Notes (Signed)
[place] EMERGENCY DEPARTMENT AT [facility] Provider Note   CSN: [id number] Arrival date & time: 12/14/22  1449     History  Chief Complaint  Patient presents with   Chest [name]   Shortness of Breath    Brad [name] is a 67 y.o. male.  Patient with history of atypical chest [name], Brad [name], Brad [name], Brad [name], Brad [name], presents today with complaints of chest [name]. Patient with several ER visits over the past few months for same with last work-up yesterday. [name] is constant. Denies any cardiac history, nausea, vomiting, diarrhea, or abdominal [name].  The history is provided by the patient. No language interpreter was used.  Chest [name] Associated symptoms: shortness of breath   Shortness of Breath Associated symptoms: chest [name]        Home Medications Prior to Admission medications   Medication Sig Start Date End Date Taking? Authorizing Provider  Alpha-Lipoic Acid 600 MG CAPS Take 600 mg by mouth daily.    [provider]  amLODipine (NORVASC) 5 MG tablet Take 2 tablets (10 mg total) by mouth daily. 03/02/19   Malvin Johns, MD  ARIPiprazole (ABILIFY) 5 MG tablet Take 1 tablet (5 mg total) by mouth daily. 11/29/22   Sarina Ill, DO  atorvastatin (LIPITOR) 10 MG tablet Take 10 mg by mouth 3 (three) times a week. MWF 08/10/19   [provider]  busPIRone (BUSPAR) 10 MG tablet Take 2 tablets (20 mg total) by mouth 3 (three) times daily. 11/29/22   Sarina Ill, DO  Cholecalciferol (VITAMIN D3) 125 MCG (5000 UT) TABS Take 5,000 Units by mouth daily.    [provider]  Coenzyme Q10 50 MG CAPS Take 50 mg by mouth daily.    [provider]  cyclobenzaprine (FLEXERIL) 10 MG tablet Take 1 tablet (10 mg total) by mouth 2 (two) times daily as needed for muscle spasms. 11/06/22   Al Decant, PA-C  docusate sodium (COLACE) 100 MG capsule Take 1 capsule (100 mg total) by mouth every 12 (twelve) hours. 09/26/22   Wynetta Fines, MD  LORazepam (ATIVAN) 2 MG tablet Take 1 tablet (2 mg total) by mouth 2 (two) times daily. 11/29/22   Sarina Ill, DO  losartan (COZAAR) 100 MG tablet Take 100 mg by mouth daily. 02/04/20   [provider]  meloxicam (MOBIC) 7.5 MG tablet Take 7.5 mg by mouth daily.    [provider]  mirtazapine (REMERON) 30 MG tablet Take 1 tablet (30 mg total) by mouth at bedtime. 11/29/22   Sarina Ill, DO  Multiple Vitamin (MULTIVITAMIN) tablet Take 1 tablet by mouth daily.    [provider]  Omega-3 1000 MG CAPS Take 1,000 mg by mouth daily.    [provider]  polyethylene glycol (MIRALAX) 17 g packet Take 17 g by mouth 2 (two) times daily. 10/21/22   Marguerita Merles Latif, DO  pregabalin (LYRICA) 75 MG capsule Take 75 mg by mouth 2 (two) times daily.    [provider]  senna-docusate (SENOKOT-S) 8.6-50 MG tablet Take 1 tablet by mouth at bedtime as needed for mild constipation. 10/13/22   Long, Arlyss Repress, MD  traZODone (DESYREL) 50 MG tablet Take 1-2 tablets (50-100 mg total) by mouth at bedtime as needed for sleep. 11/29/22   Sarina Ill, DO  zaleplon (SONATA) 10 MG capsule Take 10-20 mg by mouth at bedtime as needed for sleep.    [provider]  zinc gluconate 50 MG tablet Take 50 mg by mouth daily.    [provider]      Allergies    Ciprofloxacin, Citalopram, Duloxetine, Duloxetine hcl, Fluticasone-umeclidin-vilant, Gabapentin, and Oxcarbazepine    Review of Systems   Review of Systems  Respiratory:  Positive for shortness of breath.   Cardiovascular:  Positive for chest [name].  All other systems reviewed and are negative.   Physical Exam Updated Vital Signs BP (!) 149/92 (BP Location: Left Arm)   Pulse 92   Temp 98 F (36.7 C) (Oral)   Resp (!) 21   Ht 5\' 9"  (1.753 m)   Wt 56.7 kg   SpO2 99%   BMI 18.46 kg/m  Physical Exam Vitals and nursing note reviewed.  Constitutional:       General: He is not in acute distress.    Appearance: Normal appearance. He is normal weight. He is not ill-appearing, toxic-appearing or diaphoretic.  HENT:     Head: Normocephalic and atraumatic.  Cardiovascular:     Rate and Rhythm: Normal rate and regular rhythm.     Heart sounds: Normal heart sounds.  Pulmonary:     Effort: Pulmonary effort is normal. No respiratory distress.     Breath sounds: Normal breath sounds.  Abdominal:     Palpations: Abdomen is soft.  Musculoskeletal:        General: Normal range of motion.     Cervical back: Normal range of motion.     Right lower leg: No tenderness. No edema.     Left lower leg: No tenderness. No edema.  Skin:    General: Skin is warm and dry.  Neurological:     General: No focal deficit present.     Mental Status: He is alert.  Psychiatric:        Mood and Affect: Mood normal.        Behavior: Behavior normal.     ED Results / Procedures / Treatments   Labs (all labs ordered are listed, but only abnormal results are displayed) Labs Reviewed  CBC WITH DIFFERENTIAL/PLATELET - Abnormal; Notable for the following components:      Result Value   RBC 4.11 (*)    Hemoglobin 12.7 (*)    HCT 36.4 (*)    All other components within normal limits  COMPREHENSIVE METABOLIC PANEL - Abnormal; Notable for the following components:   Sodium 129 (*)    Chloride 95 (*)    All other components within normal limits  LIPASE, BLOOD - Abnormal; Notable for the following components:   Lipase 82 (*)    All other components within normal limits  URINALYSIS, ROUTINE W REFLEX MICROSCOPIC  TROPONIN I (HIGH SENSITIVITY)  TROPONIN I (HIGH SENSITIVITY)    EKG None  Radiology DG Chest 2 View  Result Date: 12/13/2022 CLINICAL DATA:  chest [name] EXAM: CHEST - 2 VIEW COMPARISON:  12/06/2022 FINDINGS: Cardiac silhouette enlarged. No evidence of pneumothorax or pleural effusion. No evidence of pulmonary edema. No osseous abnormalities identified.  IMPRESSION: Enlarged cardiac silhouette. Electronically Signed   By: Layla Maw M.D.   On: 12/13/2022 07:59    Procedures Procedures    Medications Ordered in ED Medications  LORazepam (ATIVAN) injection 1 mg (has no administration in time range)    ED Course/ Medical Decision Making/ A&P                             Medical  Decision Making Risk Prescription drug management.   This patient is a 67 y.o. male who presents to the ED for concern of chest [name], this involves an extensive number of treatment options, and is a complaint that carries with it a high risk of complications and morbidity. The emergent differential diagnosis prior to evaluation includes, but is not limited to,  ACS, pericarditis, myocarditis, aortic dissection, PE, pneumothorax, esophageal spasm or rupture, chronic angina, pneumonia, bronchitis, GERD, reflux/PUD, biliary disease, pancreatitis, costochondritis, Brad [name]  This is not an exhaustive differential.   Past Medical History / Co-morbidities / Social History: history of atypical chest [name], Brad [name], Brad [name], Brad [name], Brad [name]  Additional history: Chart reviewed. Pertinent results include: 39 visits in the past 6 months with last visit yesterday. 1 episode of elevated troponin 1 month ago. Normal echo on 10/20/22  Physical Exam: Physical exam performed. The pertinent findings include: clearly anxious  Lab Tests: I ordered, and personally interpreted labs.  The pertinent results include:  Na 129, lipase 82. Troponin negative and flat   Imaging Studies: I ordered imaging studies including CXR. I independently visualized and interpreted imaging which showed enlarged cardiac silhouette. I agree with the radiologist interpretation.   Cardiac Monitoring:  The patient was maintained on a cardiac monitor. Cardiac monitor showed an underlying rhythm of: sinus rhythm, no STEMI. I agree with this interpretation.   Medications: I ordered medication  including ativan  for Brad [name]. Reevaluation of the patient after these medicines showed that the patient improved. I have reviewed the patients home medicines and have made adjustments as needed.   Disposition:  After evaluating all of the data points in this case, the presentation of Kayveon C Litaker is NOT consistent with Acute Coronary Syndrome (ACS) and/or myocardial ischemia, pulmonary embolism, aortic dissection; Borhaave's, significant arrythmia, pneumothorax, cardiac tamponade, or other emergent cardiopulmonary condition.  Further, the presentation of Mukesh C Truszkowski is NOT consistent with pericarditis, myocarditis, cholecystitis, pancreatitis, mediastinitis, endocarditis, new valvular disease.  Additionally, the presentation of SABRINA ARRIAGA NOT consistent with flail chest, cardiac contusion, ARDS, or significant intra-thoracic or intra-abdominal bleeding.  Moreover, this presentation is NOT consistent with pneumonia, sepsis, or pyelonephritis.  Patient with some improvement after administration of benzodiazepines.   Evaluation and diagnostic testing in the emergency department does not suggest an emergent condition requiring admission or immediate intervention beyond what has been performed at this time.  Plan for discharge with close PCP follow-up.  Patient is understanding and amenable with plan, educated on red flag symptoms that would prompt immediate return.  Patient discharged in stable condition.  Strict return and follow-up precautions have been given by me personally or by detailed written instruction given verbally by nursing staff using the teach back method to the patient/family/caregiver(s).  Data Reviewed/Counseling: I have reviewed the patient's vital signs, nursing notes, and other relevant tests/information. I had a detailed discussion regarding the historical points, exam findings, and any diagnostic results supporting the discharge diagnosis. I also discussed the need for  outpatient follow-up and the need to return to the ED if symptoms worsen or if there are any questions or concerns that arise at home.   This is a shared visit with supervising physician Dr. Blinda Leatherwood who has independently evaluated patient & provided guidance in evaluation/management/disposition, in agreement with care    Final Clinical Impression(s) / ED Diagnoses Final diagnoses:  Precordial chest [name]  Brad [name]    Rx / DC Orders ED Discharge Orders     None  An After Visit Summary was printed and given to the patient.     Vear Clock 12/15/22 0003    Gilda Crease, MD 12/15/22 [phone]

## 2022-12-15 ENCOUNTER — Emergency Department (HOSPITAL_BASED_OUTPATIENT_CLINIC_OR_DEPARTMENT_OTHER)
Admission: EM | Admit: 2022-12-15 | Discharge: 2022-12-15 | Disposition: A | Discharge status: Home / Self Care | Payer: Medicare Other | Source: Home / Self Care | Attending: Emergency Medicine | Admitting: Emergency Medicine

## 2022-12-15 ENCOUNTER — Emergency Department (HOSPITAL_BASED_OUTPATIENT_CLINIC_OR_DEPARTMENT_OTHER): Payer: Medicare Other

## 2022-12-15 ENCOUNTER — Other Ambulatory Visit: Payer: Self-pay

## 2022-12-15 DIAGNOSIS — R109 Unspecified abdominal pain: Secondary | ICD-10-CM | POA: Insufficient documentation

## 2022-12-15 DIAGNOSIS — M542 Cervicalgia: Secondary | ICD-10-CM | POA: Insufficient documentation

## 2022-12-15 DIAGNOSIS — I959 Hypotension, unspecified: Secondary | ICD-10-CM | POA: Insufficient documentation

## 2022-12-15 DIAGNOSIS — Z79899 Other long term (current) drug therapy: Secondary | ICD-10-CM | POA: Insufficient documentation

## 2022-12-15 DIAGNOSIS — I10 Essential (primary) hypertension: Secondary | ICD-10-CM | POA: Insufficient documentation

## 2022-12-15 DIAGNOSIS — R072 Precordial pain: Secondary | ICD-10-CM | POA: Diagnosis not present

## 2022-12-15 DIAGNOSIS — E871 Hypo-osmolality and hyponatremia: Secondary | ICD-10-CM | POA: Insufficient documentation

## 2022-12-15 DIAGNOSIS — R55 Syncope and collapse: Secondary | ICD-10-CM | POA: Insufficient documentation

## 2022-12-15 DIAGNOSIS — W19XXXA Unspecified fall, initial encounter: Secondary | ICD-10-CM

## 2022-12-15 LAB — COMPREHENSIVE METABOLIC PANEL
ALT: 25 U/L (ref 0–44)
AST: 23 U/L (ref 15–41)
Albumin: 4.3 g/dL (ref 3.5–5.0)
Alkaline Phosphatase: 86 U/L (ref 38–126)
Anion gap: 7 (ref 5–15)
BUN: 15 mg/dL (ref 8–23)
CO2: 25 mmol/L (ref 22–32)
Calcium: 9.5 mg/dL (ref 8.9–10.3)
Chloride: 96 mmol/L — ABNORMAL LOW (ref 98–111)
Creatinine, Ser: 0.76 mg/dL (ref 0.61–1.24)
GFR, Estimated: >60 mL/min (ref >60)
Glucose, Bld: 87 mg/dL (ref 70–99)
Potassium: 4.3 mmol/L (ref 3.5–5.1)
Sodium: 128 mmol/L — ABNORMAL LOW (ref 135–145)
Total Bilirubin: 0.6 mg/dL (ref 0.3–1.2)
Total Protein: 6.9 g/dL (ref 6.5–8.1)

## 2022-12-15 LAB — CBC WITH DIFFERENTIAL/PLATELET
Abs Immature Granulocytes: 0.05 10*3/uL (ref 0.00–0.07)
Basophils Absolute: 0.1 10*3/uL (ref 0.0–0.1)
Basophils Relative: 1 %
Eosinophils Absolute: 0.1 10*3/uL (ref 0.0–0.5)
Eosinophils Relative: 2 %
HCT: 37.5 % — ABNORMAL LOW (ref 39.0–52.0)
Hemoglobin: 13.1 g/dL (ref 13.0–17.0)
Immature Granulocytes: 1 %
Lymphocytes Relative: 20 %
Lymphs Abs: 1.8 10*3/uL (ref 0.7–4.0)
MCH: 30.9 pg (ref 26.0–34.0)
MCHC: 34.9 g/dL (ref 30.0–36.0)
MCV: 88.4 fL (ref 80.0–100.0)
Monocytes Absolute: 0.9 10*3/uL (ref 0.1–1.0)
Monocytes Relative: 10 %
Neutro Abs: 6.1 10*3/uL (ref 1.7–7.7)
Neutrophils Relative %: 66 %
Platelets: 196 10*3/uL (ref 150–400)
RBC: 4.24 MIL/uL (ref 4.22–5.81)
RDW: 13.2 % (ref 11.5–15.5)
WBC: 9 10*3/uL (ref 4.0–10.5)
nRBC: 0 % (ref 0.0–0.2)

## 2022-12-15 MED ORDER — SODIUM CHLORIDE 0.9 % IV BOLUS
1000.0000 mL | Freq: Once | INTRAVENOUS | Status: AC
  Administered 2022-12-15: 1000 mL via INTRAVENOUS

## 2022-12-15 NOTE — ED Triage Notes (Signed)
Pt arrives to ED POV C/O of Near syncopal episode. States he got out of his car, got dizzy and fell.

## 2022-12-15 NOTE — ED Notes (Signed)
Discharge paperwork given and verbally understood. 

## 2022-12-15 NOTE — Discharge Instructions (Addendum)
Stop Amlodipine until you see your PCP.

## 2022-12-15 NOTE — ED Provider Notes (Signed)
[place] EMERGENCY DEPARTMENT AT [facility] Provider Note   CSN: [id number] Arrival date & time: 12/15/22  1709     History  Chief Complaint  Patient presents with   Near Syncope    Kara Mierzejewski Walling is a 67 y.o. male.  Pt is a 67 yo male with pmhx significant for gerd, anxiety, depression, htn, and djd.  Pt is well known to the ED.  He has been here 40 times in the last 6 months.  This is his 3rd day in the row at the ED.  Pt said he felt dizzy when he got out of his car.  He said he fell and hit the back of his head.  He denied any neck pain initially.  He did call EMS, but he did not go to the ED via EMS.  He was able to drive here.  He is able to ambulate.  He said he's lost about 40 lbs and thinks his bp medication may be too much.  He's decreased his amlodipine from 10 to 5, but his bp is still running low.       Home Medications Prior to Admission medications   Medication Sig Start Date End Date Taking? Authorizing Provider  Alpha-Lipoic Acid 600 MG CAPS Take 600 mg by mouth daily.    [provider]  amLODipine (NORVASC) 5 MG tablet Take 2 tablets (10 mg total) by mouth daily. 03/02/19   Malvin Johns, MD  ARIPiprazole (ABILIFY) 5 MG tablet Take 1 tablet (5 mg total) by mouth daily. 11/29/22   Sarina Ill, DO  atorvastatin (LIPITOR) 10 MG tablet Take 10 mg by mouth 3 (three) times a week. MWF 08/10/19   [provider]  busPIRone (BUSPAR) 10 MG tablet Take 2 tablets (20 mg total) by mouth 3 (three) times daily. 11/29/22   Sarina Ill, DO  Cholecalciferol (VITAMIN D3) 125 MCG (5000 UT) TABS Take 5,000 Units by mouth daily.    [provider]  Coenzyme Q10 50 MG CAPS Take 50 mg by mouth daily.    [provider]  cyclobenzaprine (FLEXERIL) 10 MG tablet Take 1 tablet (10 mg total) by mouth 2 (two) times daily as needed for muscle spasms. 11/06/22   Al Decant, PA-C  docusate sodium (COLACE) 100 MG capsule Take 1  capsule (100 mg total) by mouth every 12 (twelve) hours. 09/26/22   Wynetta Fines, MD  LORazepam (ATIVAN) 2 MG tablet Take 1 tablet (2 mg total) by mouth 2 (two) times daily. 11/29/22   Sarina Ill, DO  losartan (COZAAR) 100 MG tablet Take 100 mg by mouth daily. 02/04/20   [provider]  meloxicam (MOBIC) 7.5 MG tablet Take 7.5 mg by mouth daily.    [provider]  mirtazapine (REMERON) 30 MG tablet Take 1 tablet (30 mg total) by mouth at bedtime. 11/29/22   Sarina Ill, DO  Multiple Vitamin (MULTIVITAMIN) tablet Take 1 tablet by mouth daily.    [provider]  Omega-3 1000 MG CAPS Take 1,000 mg by mouth daily.    [provider]  polyethylene glycol (MIRALAX) 17 g packet Take 17 g by mouth 2 (two) times daily. 10/21/22   Marguerita Merles Latif, DO  pregabalin (LYRICA) 75 MG capsule Take 75 mg by mouth 2 (two) times daily.    [provider]  senna-docusate (SENOKOT-S) 8.6-50 MG tablet Take 1 tablet by mouth at bedtime as needed for mild constipation. 10/13/22  Long, Arlyss Repress, MD  traZODone (DESYREL) 50 MG tablet Take 1-2 tablets (50-100 mg total) by mouth at bedtime as needed for sleep. 11/29/22   Sarina Ill, DO  zaleplon (SONATA) 10 MG capsule Take 10-20 mg by mouth at bedtime as needed for sleep.    [provider]  zinc gluconate 50 MG tablet Take 50 mg by mouth daily.    [provider]      Allergies    Ciprofloxacin, Citalopram, Duloxetine, Duloxetine hcl, Fluticasone-umeclidin-vilant, Gabapentin, and Oxcarbazepine    Review of Systems   Review of Systems  Neurological:  Positive for dizziness.  All other systems reviewed and are negative.   Physical Exam Updated Vital Signs BP (!) 145/90 (BP Location: Right Arm)   Pulse (!) 59   Temp 98.5 F (36.9 C) (Oral)   Resp (!) 23   Ht  (1.753 m)   Wt 56.7 kg   SpO2 100%   BMI 18.46 kg/m  Physical Exam Vitals and nursing note  reviewed.  Constitutional:      Appearance: Normal appearance.  HENT:     Head: Normocephalic and atraumatic.     Right Ear: External ear normal.     Left Ear: External ear normal.     Nose: Nose normal.     Mouth/Throat:     Mouth: Mucous membranes are dry.  Eyes:     Extraocular Movements: Extraocular movements intact.     Conjunctiva/sclera: Conjunctivae normal.     Pupils: Pupils are equal, round, and reactive to light.  Cardiovascular:     Rate and Rhythm: Normal rate and regular rhythm.     Pulses: Normal pulses.     Heart sounds: Normal heart sounds.  Pulmonary:     Effort: Pulmonary effort is normal.     Breath sounds: Normal breath sounds.  Abdominal:     General: Abdomen is flat. Bowel sounds are normal.     Palpations: Abdomen is soft.  Musculoskeletal:        General: Normal range of motion.     Cervical back: Normal range of motion and neck supple.  Skin:    General: Skin is warm.     Capillary Refill: Capillary refill takes less than 2 seconds.  Neurological:     General: No focal deficit present.     Mental Status: He is alert and oriented to person, place, and time.  Psychiatric:        Mood and Affect: Mood is anxious.        Behavior: Behavior normal.     ED Results / Procedures / Treatments   Labs (all labs ordered are listed, but only abnormal results are displayed) Labs Reviewed  CBC WITH DIFFERENTIAL/PLATELET - Abnormal; Notable for the following components:      Result Value   HCT 37.5 (*)    All other components within normal limits  COMPREHENSIVE METABOLIC PANEL - Abnormal; Notable for the following components:   Sodium 128 (*)    Chloride 96 (*)    All other components within normal limits    EKG None  Radiology CT Head Wo Contrast  Result Date: 12/15/2022 CLINICAL DATA:  Head trauma EXAM: CT HEAD WITHOUT CONTRAST TECHNIQUE: Contiguous axial images were obtained from the base of the skull through the vertex without intravenous  contrast. RADIATION DOSE REDUCTION: This exam was performed according to the departmental dose-optimization program which includes automated exposure control, adjustment of the mA and/or kV according to patient  size and/or use of iterative reconstruction technique. COMPARISON:  10/02/2022 FINDINGS: Brain: There is no mass, hemorrhage or extra-axial collection. The size and configuration of the ventricles and extra-axial CSF spaces are normal. There is hypoattenuation of the white matter, most commonly indicating chronic small vessel disease. Vascular: No abnormal hyperdensity of the major intracranial arteries or dural venous sinuses. No intracranial atherosclerosis. Skull: The visualized skull base, calvarium and extracranial soft tissues are normal. Sinuses/Orbits: No fluid levels or advanced mucosal thickening of the visualized paranasal sinuses. No mastoid or middle ear effusion. The orbits are normal. IMPRESSION: 1. No acute intracranial abnormality. 2. Chronic small vessel disease. Electronically Signed   By: Deatra Robinson M.D.   On: 12/15/2022 19:38    Procedures Procedures    Medications Ordered in ED Medications  sodium chloride 0.9 % bolus 1,000 mL (0 mLs Intravenous Stopped 12/15/22 2107)    ED Course/ Medical Decision Making/ A&P                             Medical Decision Making Amount and/or Complexity of Data Reviewed Labs: ordered. Radiology: ordered.   This patient presents to the ED for concern of near syncope, this involves an extensive number of treatment options, and is a complaint that carries with it a high risk of complications and morbidity.  The differential diagnosis includes orthostatic hypotension, cardiac abn   Co morbidities that complicate the patient evaluation  gerd, anxiety, depression, htn, and djd   Additional history obtained:  Additional history obtained from epic chart review    Lab Tests:  I Ordered, and personally interpreted labs.  The  pertinent results include:  cbc nl, cmp with na sl low at 128 (chronic)   Imaging Studies ordered:  I ordered imaging studies including ct head  I independently visualized and interpreted imaging which showed  No acute intracranial abnormality.  2. Chronic small vessel disease.   I agree with the radiologist interpretation   Cardiac Monitoring:  The patient was maintained on a cardiac monitor.  I personally viewed and interpreted the cardiac monitored which showed an underlying rhythm of: nsr   Medicines ordered and prescription drug management:  I ordered medication including ivfs  for hypotension  Reevaluation of the patient after these medicines showed that the patient improved I have reviewed the patients home medicines and have made adjustments as needed   Test Considered:  ct   Critical Interventions:  ivfs   Problem List / ED Course:  Hypotension:  bp in the 90s here.  Pt given 1L NS and bp in the 120s.  He is not orthostatic after fluids.  He is told to hold the amlodipine.   Hyponatremia:  likely due to poor intake.  His na has been low since 4/8.  It is not worsening.  NS should help. Near syncope:  likely due to orthostatic hypotension.  He showed me EMS's EKG which was normal.  He has had several cardiac evaluations recently which have been ok. Neck pain:  after pt told he was going to go home, he started c/o neck pain.  He had no tenderness to palpation initially, so I don't think he needs any further eval.   Abd pain:  pt has been having chronic abd pain with constipation.  I don't think this needs to be addressed at this visit.  He's had multiple ct scans for this issue.   Reevaluation:  After the interventions noted  above, I reevaluated the patient and found that they have :improved   Social Determinants of Health:  Lives at home   Dispostion:  After consideration of the diagnostic results and the patients response to treatment, I feel that the  patent would benefit from discharge with outpatient f/u.          Final Clinical Impression(s) / ED Diagnoses Final diagnoses:  Hyponatremia  Fall, initial encounter  Hypotension, unspecified hypotension type    Rx / DC Orders ED Discharge Orders     None         Jacalyn Lefevre, MD 12/15/22 2121

## 2022-12-15 NOTE — ED Notes (Signed)
Pt was concerned of pain to his neck before discharge... Provider aware and stated that he was okay to be discharged.Marland KitchenMarland Kitchen

## 2022-12-27 ENCOUNTER — Encounter (HOSPITAL_COMMUNITY): Payer: Self-pay

## 2022-12-27 ENCOUNTER — Emergency Department (HOSPITAL_COMMUNITY)
Admission: EM | Admit: 2022-12-27 | Discharge: 2022-12-27 | Disposition: A | Discharge status: Home / Self Care | Payer: Medicare Other | Attending: Emergency Medicine | Admitting: Emergency Medicine

## 2022-12-27 ENCOUNTER — Other Ambulatory Visit: Payer: Self-pay

## 2022-12-27 DIAGNOSIS — I10 Essential (primary) hypertension: Secondary | ICD-10-CM

## 2022-12-27 DIAGNOSIS — E871 Hypo-osmolality and hyponatremia: Secondary | ICD-10-CM

## 2022-12-27 DIAGNOSIS — F451 Undifferentiated somatoform disorder: Secondary | ICD-10-CM | POA: Diagnosis present

## 2022-12-27 DIAGNOSIS — R21 Rash and other nonspecific skin eruption: Secondary | ICD-10-CM | POA: Insufficient documentation

## 2022-12-27 DIAGNOSIS — R410 Disorientation, unspecified: Secondary | ICD-10-CM | POA: Diagnosis not present

## 2022-12-27 DIAGNOSIS — R7309 Other abnormal glucose: Secondary | ICD-10-CM

## 2022-12-27 DIAGNOSIS — Z79899 Other long term (current) drug therapy: Secondary | ICD-10-CM | POA: Diagnosis not present

## 2022-12-27 LAB — RAPID URINE DRUG SCREEN, HOSP PERFORMED
Amphetamines: NOT DETECTED
Barbiturates: NOT DETECTED
Benzodiazepines: NOT DETECTED
Cocaine: NOT DETECTED
Opiates: NOT DETECTED
Tetrahydrocannabinol: NOT DETECTED

## 2022-12-27 LAB — CBC WITH DIFFERENTIAL/PLATELET
Abs Immature Granulocytes: 0.06 10*3/uL (ref 0.00–0.07)
Basophils Absolute: 0.1 10*3/uL (ref 0.0–0.1)
Basophils Relative: 1 %
Eosinophils Absolute: 0.1 10*3/uL (ref 0.0–0.5)
Eosinophils Relative: 1 %
HCT: 37.2 % — ABNORMAL LOW (ref 39.0–52.0)
Hemoglobin: 12.9 g/dL — ABNORMAL LOW (ref 13.0–17.0)
Immature Granulocytes: 1 %
Lymphocytes Relative: 12 %
Lymphs Abs: 1.2 10*3/uL (ref 0.7–4.0)
MCH: 31.1 pg (ref 26.0–34.0)
MCHC: 34.7 g/dL (ref 30.0–36.0)
MCV: 89.6 fL (ref 80.0–100.0)
Monocytes Absolute: 0.8 10*3/uL (ref 0.1–1.0)
Monocytes Relative: 7 %
Neutro Abs: 8.1 10*3/uL — ABNORMAL HIGH (ref 1.7–7.7)
Neutrophils Relative %: 78 %
Platelets: 269 10*3/uL (ref 150–400)
RBC: 4.15 MIL/uL — ABNORMAL LOW (ref 4.22–5.81)
RDW: 13.2 % (ref 11.5–15.5)
WBC: 10.2 10*3/uL (ref 4.0–10.5)
nRBC: 0 % (ref 0.0–0.2)

## 2022-12-27 LAB — URINALYSIS, W/ REFLEX TO CULTURE (INFECTION SUSPECTED)
Bacteria, UA: NONE SEEN
Bilirubin Urine: NEGATIVE
Glucose, UA: NEGATIVE mg/dL
Hgb urine dipstick: NEGATIVE
Ketones, ur: NEGATIVE mg/dL
Leukocytes,Ua: NEGATIVE
Nitrite: NEGATIVE
Protein, ur: NEGATIVE mg/dL
Specific Gravity, Urine: 1.011 (ref 1.005–1.030)
pH: 7 (ref 5.0–8.0)

## 2022-12-27 LAB — COMPREHENSIVE METABOLIC PANEL
ALT: 19 U/L (ref 0–44)
AST: 26 U/L (ref 15–41)
Albumin: 3.9 g/dL (ref 3.5–5.0)
Alkaline Phosphatase: 108 U/L (ref 38–126)
Anion gap: 12 (ref 5–15)
BUN: 16 mg/dL (ref 8–23)
CO2: 23 mmol/L (ref 22–32)
Calcium: 9.5 mg/dL (ref 8.9–10.3)
Chloride: 95 mmol/L — ABNORMAL LOW (ref 98–111)
Creatinine, Ser: 0.74 mg/dL (ref 0.61–1.24)
GFR, Estimated: >60 mL/min (ref >60)
Glucose, Bld: 133 mg/dL — ABNORMAL HIGH (ref 70–99)
Potassium: 3.6 mmol/L (ref 3.5–5.1)
Sodium: 130 mmol/L — ABNORMAL LOW (ref 135–145)
Total Bilirubin: 0.8 mg/dL (ref 0.3–1.2)
Total Protein: 7.5 g/dL (ref 6.5–8.1)

## 2022-12-27 LAB — SEDIMENTATION RATE: Sed Rate: 25 mm/hr — ABNORMAL HIGH (ref 0–16)

## 2022-12-27 LAB — ETHANOL: Alcohol, Ethyl (B): <10 mg/dL (ref <10)

## 2022-12-27 MED ORDER — ALPHA-LIPOIC ACID 600 MG PO CAPS: 600.0000 mg | ORAL_CAPSULE | Freq: Every day | ORAL | Status: DC

## 2022-12-27 MED ORDER — TRAZODONE HCL 100 MG PO TABS: 50.0000 mg | ORAL_TABLET | Freq: Every evening | ORAL | Status: DC | PRN

## 2022-12-27 MED ORDER — LORAZEPAM 1 MG PO TABS
2.0000 mg | ORAL_TABLET | Freq: Once | ORAL | Status: AC
  Administered 2022-12-27: 2 mg via ORAL
  Filled 2022-12-27: qty 2

## 2022-12-27 MED ORDER — PREGABALIN 50 MG PO CAPS: 75.0000 mg | ORAL_CAPSULE | Freq: Two times a day (BID) | ORAL | Status: DC

## 2022-12-27 MED ORDER — ATORVASTATIN CALCIUM 10 MG PO TABS: 10.0000 mg | ORAL_TABLET | ORAL | Status: DC

## 2022-12-27 MED ORDER — ARIPIPRAZOLE 5 MG PO TABS: 5.0000 mg | ORAL_TABLET | Freq: Every day | ORAL | Status: DC

## 2022-12-27 MED ORDER — MIRTAZAPINE 30 MG PO TABS: 30.0000 mg | ORAL_TABLET | Freq: Every day | ORAL | Status: DC

## 2022-12-27 MED ORDER — VITAMIN D3 125 MCG (5000 UT) PO TABS: 5000.0000 [IU] | ORAL_TABLET | Freq: Every day | ORAL | Status: DC

## 2022-12-27 MED ORDER — ONE-DAILY MULTI VITAMINS PO TABS: 1.0000 | ORAL_TABLET | Freq: Every day | ORAL | Status: DC

## 2022-12-27 MED ORDER — COENZYME Q10 50 MG PO CAPS: 50.0000 mg | ORAL_CAPSULE | Freq: Every day | ORAL | Status: DC

## 2022-12-27 MED ORDER — ACETAMINOPHEN 325 MG PO TABS: 650.0000 mg | ORAL_TABLET | ORAL | Status: DC | PRN

## 2022-12-27 MED ORDER — LOSARTAN POTASSIUM 25 MG PO TABS: 100.0000 mg | ORAL_TABLET | Freq: Every day | ORAL | Status: DC

## 2022-12-27 MED ORDER — OMEGA-3 1000 MG PO CAPS: 1000.0000 mg | ORAL_CAPSULE | Freq: Every day | ORAL | Status: DC

## 2022-12-27 MED ORDER — LORAZEPAM 1 MG PO TABS: 2.0000 mg | ORAL_TABLET | ORAL | Status: DC | PRN

## 2022-12-27 MED ORDER — ZINC GLUCONATE 50 MG PO TABS: 50.0000 mg | ORAL_TABLET | Freq: Every day | ORAL | Status: DC

## 2022-12-27 MED ORDER — AMLODIPINE BESYLATE 5 MG PO TABS: 10.0000 mg | ORAL_TABLET | Freq: Every day | ORAL | Status: DC

## 2022-12-27 MED ORDER — BUSPIRONE HCL 10 MG PO TABS: 20.0000 mg | ORAL_TABLET | Freq: Three times a day (TID) | ORAL | Status: DC

## 2022-12-27 MED ORDER — ALUM & MAG HYDROXIDE-SIMETH 200-200-20 MG/5ML PO SUSP: 30.0000 mL | Freq: Four times a day (QID) | ORAL | Status: DC | PRN

## 2022-12-27 MED ORDER — ONDANSETRON HCL 4 MG PO TABS: 4.0000 mg | ORAL_TABLET | Freq: Three times a day (TID) | ORAL | Status: DC | PRN

## 2022-12-27 NOTE — ED Triage Notes (Addendum)
Pt was BIB [facility] [place] the [employer] [place].  Someone at the hotel called d/t behavior and AMS.  Pt not able to state year or month but knows name and DOB.  Pt has a  lot of involuntary movements and "asking me to help him".  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  Pt c/o of rash on both legs. Rash on bilat legs - "painful and itchy"

## 2022-12-27 NOTE — ED Provider Notes (Signed)
[place] EMERGENCY DEPARTMENT AT [facility] Provider Note   CSN: [id number] Arrival date & time: 12/27/22  0035     History  Chief Complaint  Patient presents with   Psychiatric Evaluation   Rash    Brad [name] is a 67 y.o. male.  The history is provided by the EMS personnel. The history is limited by the condition of the patient (Psychiatric disorder).  Rash He has history of hypertension, GERD, anxiety, depression and was brought in because of disruptive behavior at the hotel where he is living.  Patient is not able to tell me why he is here.  He apparently has been complaining of a rash on his legs.  He cannot tell me how long it has been there.  He told triage nurse that is painful and itchy, denies itching on my questioning.  He denies hallucinations and denies homicidal and suicidal ideation.   Home Medications Prior to Admission medications   Medication Sig Start Date End Date Taking? Authorizing Provider  Alpha-Lipoic Acid 600 MG CAPS Take 600 mg by mouth daily.    [provider]  amLODipine (NORVASC) 5 MG tablet Take 2 tablets (10 mg total) by mouth daily. 03/02/19   Malvin Johns, MD  ARIPiprazole (ABILIFY) 5 MG tablet Take 1 tablet (5 mg total) by mouth daily. 11/29/22   Sarina Ill, DO  atorvastatin (LIPITOR) 10 MG tablet Take 10 mg by mouth 3 (three) times a week. MWF 08/10/19   [provider]  busPIRone (BUSPAR) 10 MG tablet Take 2 tablets (20 mg total) by mouth 3 (three) times daily. 11/29/22   Sarina Ill, DO  Cholecalciferol (VITAMIN D3) 125 MCG (5000 UT) TABS Take 5,000 Units by mouth daily.    [provider]  Coenzyme Q10 50 MG CAPS Take 50 mg by mouth daily.    [provider]  cyclobenzaprine (FLEXERIL) 10 MG tablet Take 1 tablet (10 mg total) by mouth 2 (two) times daily as needed for muscle spasms. 11/06/22   Al Decant, PA-C  docusate sodium (COLACE) 100 MG capsule Take 1 capsule  (100 mg total) by mouth every 12 (twelve) hours. 09/26/22   Wynetta Fines, MD  LORazepam (ATIVAN) 2 MG tablet Take 1 tablet (2 mg total) by mouth 2 (two) times daily. 11/29/22   Sarina Ill, DO  losartan (COZAAR) 100 MG tablet Take 100 mg by mouth daily. 02/04/20   [provider]  meloxicam (MOBIC) 7.5 MG tablet Take 7.5 mg by mouth daily.    [provider]  mirtazapine (REMERON) 30 MG tablet Take 1 tablet (30 mg total) by mouth at bedtime. 11/29/22   Sarina Ill, DO  Multiple Vitamin (MULTIVITAMIN) tablet Take 1 tablet by mouth daily.    [provider]  Omega-3 1000 MG CAPS Take 1,000 mg by mouth daily.    [provider]  polyethylene glycol (MIRALAX) 17 g packet Take 17 g by mouth 2 (two) times daily. 10/21/22   Marguerita Merles Latif, DO  pregabalin (LYRICA) 75 MG capsule Take 75 mg by mouth 2 (two) times daily.    [provider]  senna-docusate (SENOKOT-S) 8.6-50 MG tablet Take 1 tablet by mouth at bedtime as needed for mild constipation. 10/13/22   Long, Arlyss Repress, MD  traZODone (DESYREL) 50 MG tablet Take 1-2 tablets (50-100 mg total) by mouth at bedtime as needed for sleep. 11/29/22   Sarina Ill, DO  zaleplon (SONATA) 10 MG  capsule Take 10-20 mg by mouth at bedtime as needed for sleep.    [provider]  zinc gluconate 50 MG tablet Take 50 mg by mouth daily.    [provider]      Allergies    Ciprofloxacin, Citalopram, Duloxetine, Duloxetine hcl, Fluticasone-umeclidin-vilant, Gabapentin, and Oxcarbazepine    Review of Systems   Review of Systems  Unable to perform ROS: Psychiatric disorder  Skin:  Positive for rash.    Physical Exam Updated Vital Signs BP (!) 150/128   Pulse 98   Temp 98.6 F (37 C) (Oral)   Resp (!) 22   Ht 5\' 9"  (1.753 m)   Wt 56.7 kg   SpO2 99%   BMI 18.46 kg/m  Physical Exam Vitals and nursing note reviewed.   67 year old male, resting comfortably and in  no acute distress. Vital signs are significant for elevated blood pressure and slightly elevated respiratory rate. Oxygen saturation is 99%, which is normal. Head is normocephalic and atraumatic. PERRLA, EOMI. Oropharynx is clear. Neck is nontender and supple without adenopathy or JVD. Back is nontender and there is no CVA tenderness. Lungs are clear without rales, wheezes, or rhonchi. Chest is nontender. Heart has regular rate and rhythm without murmur. Abdomen is soft, flat, nontender. Extremities have no cyanosis or edema, full range of motion is present. Skin: Rashes present over the anterior aspect of the lower legs.  Rash is erythematous to violaceous with some areas that appear to be small petechiae but are not palpable. Neurologic: Awake but oriented only to person-not place or time.  Anxious and confused. Cranial nerves are intact, moves all extremities equally.  ED Results / Procedures / Treatments   Labs (all labs ordered are listed, but only abnormal results are displayed) Labs Reviewed  COMPREHENSIVE METABOLIC PANEL - Abnormal; Notable for the following components:      Result Value   Sodium 130 (*)    Chloride 95 (*)    Glucose, Bld 133 (*)    All other components within normal limits  CBC WITH DIFFERENTIAL/PLATELET - Abnormal; Notable for the following components:   RBC 4.15 (*)    Hemoglobin 12.9 (*)    HCT 37.2 (*)    Neutro Abs 8.1 (*)    All other components within normal limits  SEDIMENTATION RATE - Abnormal; Notable for the following components:   Sed Rate 25 (*)    All other components within normal limits  URINALYSIS, W/ REFLEX TO CULTURE (INFECTION SUSPECTED)  RAPID URINE DRUG SCREEN, HOSP PERFORMED  ETHANOL    EKG ED ECG REPORT   Date: 12/27/2022  Rate: 83  Rhythm: normal sinus rhythm and premature atrial contractions (PAC)  QRS Axis: left  Intervals: normal  ST/T Wave abnormalities: normal  Conduction Disutrbances:left anterior fascicular block  and incomplete right bundle branch block  Narrative Interpretation: Left anterior fascicular block, incomplete right bundle branch block.  When compared with ECG of 12/15/2022, no significant changes are seen.  Old EKG Reviewed: unchanged  I have personally reviewed the EKG tracing and agree with the computerized printout as noted.  Procedures Procedures    Medications Ordered in ED Medications - No data to display  ED Course/ Medical Decision Making/ A&P                             Medical Decision Making Amount and/or Complexity of Data Reviewed Labs: ordered.  Risk OTC drugs. Prescription  drug management.   Abnormal mental status.  Nonspecific rash.  I have reviewed his past records, and he has multiple ED visits at which time his mental status seemed distinctly better than what I am seeing today.  He was hospitalized on 11/21/2022 for anxiety and depression with psychotic features and his psychiatric exam on admission seems consistent with what I am seeing today.  I have ordered lorazepam for anxiety and I have ordered laboratory workup of CBC, comprehensive metabolic panel, ethanol level, sedimentation rate, drug screen.  I have reviewed his laboratory tests and my interpretation is hyponatremia which is stable, elevated random glucose level, borderline anemia with hemoglobin not significantly changed from prior, normal urinalysis and normal drug screen, ethanol level not detectable, sedimentation rate minimally elevated.  Because of confusion which I felt was most likely on a psychiatric basis, I have requested TTS consultation.  Patient was reevaluated, he is now awake and alert and oriented and behaving completely normally.  He does not wish to have TTS consultation.  I feel he is safe for discharge.  I have recommended that he apply hydrocortisone cream to his rash, return if he has new or concerning symptoms.  Final Clinical Impression(s) / ED Diagnoses Final diagnoses:   Confusion  Hyponatremia  Rash and nonspecific skin eruption  Elevated random blood glucose level  Elevated blood pressure reading with diagnosis of hypertension    Rx / DC Orders ED Discharge Orders     None         Dione Booze, MD 12/27/22 [phone]

## 2022-12-27 NOTE — BH Assessment (Signed)
Comprehensive Clinical Assessment (CCA) Note  12/27/2022 Brad [name] [facility] [id number]  DISPOSITION: Gave clinical report to Roselyn Bering, NP who recommended Pt be observed and evaluated by psychiatry later this morning. Dr Dione Booze states Pt is currently at baseline, is requesting to leave, and will be discharged.  The patient demonstrates the following risk factors for suicide: Chronic risk factors for suicide include: psychiatric disorder of MDD and demographic factors (male, >24 y/o). Acute risk factors for suicide include: family or marital conflict and recent discharge from inpatient psychiatry. Protective factors for this patient include: positive therapeutic relationship, responsibility to others (children, family), coping skills, hope for the future, and life satisfaction. Considering these factors, the overall suicide risk at this point appears to be low. Patient is appropriate for outpatient follow up.  Pt is a 67 year old married male who presents unaccompanied to [facility] ED via EMS. Per medical record, the clerk at the [facility] where Pt was staying called due to odd behaviors and altered mental status. While in the ED, he was observed by RN wandering around the room continuously asking for his glasses which staff states he did not arrive wearing any. During assessment, Pt states the desk clerk at the hotel called because he was "feeling horrible and faint." He says he came to the ED because he has a headache and chest pain. He also states he had a hernia repair and has pain in that area. Pt's medical record indicates he has presented to emergency departments over 30 times in the past few months with somatic complaints and has been diagnosed with anxiety, somatization disorder, malingering, and depression.   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 He denies that he is having a mental health crisis and expresses annoyance with answering  mental health questions. He says he is upset because he is in physical pain. He acknowledges that his chronic pain causes poor sleep. He denies depressive symptoms. He denies current suicidal ideation. He denies thoughts of harming others. He denies auditory or visual hallucinations. He denies alcohol or substance use.  Pt identifies his primary stressor as missing his wife. He says she is in [place] and he plans on joining her but his travel plans have been interrupted. He says he sees Dr Archer Asa for medication management and that he takes his medications as prescribed. He was was inpatient at [facility] 03/21-04/08/2022 after being involuntarily committed by his son. He has also been admitted to [facility]. He states he does not have a therapist, would like one, but now is an inconvenient time.   Pt is dressed in hospital scrubs and appears thin. He is alert and oriented x4. He speaks in a clear tone, at moderate volume and normal pace. Motor behavior appears normal. Eye contact is good. Pt's mood is irritable and affect is congruent with mood. Thought process is coherent and relevant. There is no indication he is currently responding to internal stimuli. He says although he is still experiencing discomfort, he wants to be discharged so he can rest.   Chief Complaint:  Chief Complaint  Patient presents with   Psychiatric Evaluation   Rash   Visit Diagnosis: F45.1 Somatic symptom disorder  CCA Screening, Triage and Referral (STR)  Patient Reported Information How did you hear about [place]? Self  What Is the Reason for Your Visit/Call Today? Pt has diagnosis of major depressive disorder with psychotic features. He  was transported to [facility] from his hotel due to odd behaviors and altered mental status. Pt says he came to [facility] due to feeling faint, headache, and chest pain.  How Long Has This Been Causing You Problems? > than 6 months  What Do You Feel Would  Help You the Most Today? Treatment for Depression or other mood problem   Have You Recently Had Any Thoughts About Hurting Yourself? No  Are You Planning to Commit Suicide/Harm Yourself At This time? No   Flowsheet Row ED from 12/27/2022 in [facility] Emergency Department at [facility] ED from 12/15/2022 in [facility] Emergency Department at [facility] ED from 12/14/2022 in [facility] Emergency Department at [facility]  C-SSRS RISK CATEGORY No Risk No Risk No Risk       Have you Recently Had Thoughts About Hurting Someone Karolee Ohs? No  Are You Planning to Harm Someone at This Time? No  Explanation: Pt denies current suicidal ideation or homicidal ideation.   Have You Used Any Alcohol or Drugs in the Past 24 Hours? No  What Did You Use and How Much? Pt denies substance use.   Do You Currently Have a Therapist/Psychiatrist? Yes  Name of Therapist/Psychiatrist: Name of Therapist/Psychiatrist: Dr Archer Asa   Have You Been Recently Discharged From Any Office Practice or Programs? Yes  Explanation of Discharge From Practice/Program: Discharged from [facility] 11/29/2022     CCA Screening Triage Referral Assessment Type of Contact: Tele-Assessment  Telemedicine Service Delivery: Telemedicine service delivery: This service was provided via telemedicine using a 2-way, interactive audio and video technology  Is this Initial or Reassessment? Is this Initial or Reassessment?: Initial Assessment  Date Telepsych consult ordered in [facility]:  Date Telepsych consult ordered in [facility]: 12/27/22  Time Telepsych consult ordered in [facility]:  Time Telepsych consult ordered in [facility]: [id number]  Location of Assessment: [facility] ED  Provider Location: [facility] Assessment Services   Collateral Involvement: Medical record   Does Patient Have a Automotive engineer Guardian? No  Legal Guardian Contact Information: Pt does not have a legal guardian.  Copy of Legal  Guardianship Form: -- (Pt does not have a legal guardian.)  Legal Guardian Notified of Arrival: -- (Pt does not have a legal guardian.)  Legal Guardian Notified of Pending Discharge: -- (Pt does not have a legal guardian.)  If Minor and Not Living with Parent(s), Who has Custody? Pt is an adult  Is CPS involved or ever been involved? Never  Is APS involved or ever been involved? Never   Patient Determined To Be At Risk for Harm To Self or Others Based on Review of Patient Reported Information or Presenting Complaint? No  Method: No Plan  Availability of Means: No access or NA  Intent: Vague intent or NA  Notification Required: No need or identified person  Additional Information for Danger to Others Potential: -- (NA)  Additional Comments for Danger to Others Potential: Per medical record, Pt has a history of aggressive behavior toward females.  Are There Guns or Other Weapons in Your Home? No  Types of Guns/Weapons: Pt denies access to firearms.  Are These Weapons Safely Secured?                            -- (Pt denies access to firearms.)  Who Could Verify You Are Able To Have These Secured: Pt denies access to firearms.  Do You Have any Outstanding  Charges, Pending Court Dates, Parole/Probation? Pt denies legal problems  Contacted To Inform of Risk of Harm To Self or Others: Unable to Contact:    Does Patient Present under Involuntary Commitment? 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   Mental Health Symptoms Depression:   Fatigue; Increase/decrease in appetite; Sleep (too much or little);  Irritability   Duration of Depressive symptoms:  Duration of Depressive Symptoms: Greater than two weeks   Mania:   None   Anxiety:    Worrying; Tension; Sleep; Restlessness; Irritability; Fatigue; Difficulty concentrating   Psychosis:   Other negative symptoms   Duration of Psychotic symptoms:  Duration of Psychotic Symptoms: Greater than six months   Trauma:   None   Obsessions:   None   Compulsions:   None   Inattention:   N/A   Hyperactivity/Impulsivity:   N/A   Oppositional/Defiant Behaviors:   N/A   Emotional Irregularity:   None   Other Mood/Personality Symptoms:   Per medical record, Pt focuses on somatic symptoms    Mental Status Exam Appearance and self-care  Stature:   Average   Weight:   Thin   Clothing:   -- (Scrubs)   Grooming:   Normal   Cosmetic use:   None   Posture/gait:   Normal   Motor activity:   Not Remarkable   Sensorium  Attention:   Normal   Concentration:   Anxiety interferes   Orientation:   Object; Person; Place; Situation; Time   Recall/memory:   Normal   Affect and Mood  Affect:   Appropriate   Mood:   Irritable   Relating  Eye contact:   Normal   Facial expression:   Responsive   Attitude toward examiner:   Irritable   Thought and Language  Speech flow:  Normal   Thought content:   Appropriate to Mood and Circumstances   Preoccupation:   Somatic   Hallucinations:   None   Organization:   Coherent   Company secretary of Knowledge:   Good   Intelligence:   Average   Abstraction:   Normal   Judgement:   Fair   Dance movement psychotherapist:   Adequate; Variable   Insight:   Lacking   Decision Making:   Vacilates   Social Functioning  Social Maturity:   Responsible   Social Judgement:   Normal   Stress  Stressors:   Illness; Relationship   Coping Ability:   Contractor Deficits:   None   Supports:   Family      Religion: Religion/Spirituality Are You A Religious Person?: No How Might This Affect Treatment?: NA  Leisure/Recreation: Leisure / Recreation Do You Have Hobbies?: Yes Leisure and Hobbies: "play guitar, read, hike, travel"  Exercise/Diet: Exercise/Diet Do You Exercise?: No Have You Gained or Lost A Significant Amount of Weight in the Past Six Months?: Yes-Lost Number of Pounds Lost?: 40 Do You Follow a Special Diet?: No Do You Have Any Trouble Sleeping?: Yes Explanation of Sleeping Difficulties: Pt reports difficulty sleeping due to physical pain   CCA Employment/Education Employment/Work Situation: Employment / Work Situation Employment Situation: Employed Work Stressors: None Patient's Job has  Been Impacted by Current Illness: No Has Patient ever Been in the Military?: No  Education: Education Is Patient Currently Attending School?: No Last Grade Completed:  (PhD) Did Bonita Quin Attend College?: Yes What Type of College Degree Do you Have?: PhD in psychology Did You Have An Individualized Education Program (IIEP): No Did You Have Any Difficulty At School?: No Patient's Education Has Been Impacted by Current Illness: No   CCA Family/Childhood History Family and Relationship History: Family history Marital status: Married What types of issues is patient dealing with in the relationship?: Misses wife who Pt says is in [place]. Additional relationship information: Pt says his travel plans have been interrupted Does patient have children?: Yes How many children?: 1 How is patient's relationship with their children?: Precious assessment indicates that patient's daughter passed age 68.  Pt reports conflictual relationship with son.  Childhood History:  Childhood History By whom was/is the patient raised?: Mother, Grandparents Did patient suffer any verbal/emotional/physical/sexual abuse as a child?: No Did patient suffer from severe childhood neglect?: No Has  patient ever been sexually abused/assaulted/raped as an adolescent or adult?: No Was the patient ever a victim of a crime or a disaster?: No Witnessed domestic violence?: No Has patient been affected by domestic violence as an adult?: No       CCA Substance Use Alcohol/Drug Use: Alcohol / Drug Use Pain Medications: see MAR Prescriptions: see MAR Over the Counter: see MAR History of alcohol / drug use?: No history of alcohol / drug abuse Longest period of sobriety (when/how long): none reported                         ASAM's:  Six Dimensions of Multidimensional Assessment  Dimension 1:  Acute Intoxication and/or Withdrawal Potential:      Dimension 2:  Biomedical Conditions and Complications:      Dimension 3:  Emotional, Behavioral, or Cognitive Conditions and Complications:     Dimension 4:  Readiness to Change:     Dimension 5:  Relapse, Continued use, or Continued Problem Potential:     Dimension 6:  Recovery/Living Environment:     ASAM Severity Score:    ASAM Recommended Level of Treatment:     Substance use Disorder (SUD)    Recommendations for Services/Supports/Treatments:    Discharge Disposition: Discharge Disposition Medical Exam completed: Yes Disposition of Patient: Discharge  DSM5 Diagnoses: Patient Active Problem List   Diagnosis Date Noted   Severe recurrent major depressive disorder with psychotic features (HCC) 11/22/2022   Brief psychotic disorder (HCC) 11/21/2022   Altered mental status 11/18/2022   Protein-calorie malnutrition, severe 11/18/2022   Elevated troponin 11/17/2022   AKI (acute kidney injury) (HCC) 11/17/2022   Metabolic acidosis 11/17/2022   Reactive airway disease 10/29/2022   Dysphagia 10/29/2022   Atypical chest pain 10/20/2022   Essential hypertension 10/20/2022   Dyslipidemia 10/20/2022   Anxiety and depression 10/20/2022   Peripheral neuropathy 10/20/2022   Somatic complaints, multiple 10/20/2022    Generalized anxiety disorder 08/23/2022   Constipation, chronic 08/23/2022   Incarcerated left inguinal hernia 08/20/2022   Low back pain 03/08/2022   Neuropathic pain 08/07/2019   MDD (major depressive disorder) 02/27/2019   Paresthesias 06/28/2018   Inguinal hernia, left-repair [facility] Dec 2012 09/10/2011     Referrals to Alternative Service(s): Referred to Alternative Service(s):   Place:   Date:   Time:    Referred to Alternative Service(s):   Place:   Date:  Time:    Referred to Alternative Service(s):   Place:   Date:   Time:    Referred to Alternative Service(s):   Place:   Date:   Time:     Evelena Peat, [facility]

## 2022-12-27 NOTE — ED Notes (Signed)
Patient wandering around the room continuously asking for his glasses which staff states he did not arrive wearing any. Difficult to redirect. Refusing to stay in bed for continuous monitoring.

## 2022-12-27 NOTE — Discharge Instructions (Signed)
You may apply hydrocortisone cream to your rash.  This should be applied twice a day.  Return if you have any problems.

## 2022-12-27 NOTE — ED Notes (Signed)
Spoke to lab about delay in blood being processed, states they never received the blood. RN made aware about need for redraw.

## 2022-12-27 NOTE — ED Notes (Signed)
Pt is speaking with TTS at this time.  

## 2023-11-14 ENCOUNTER — Encounter: Payer: Self-pay | Admitting: Nurse Practitioner
